# Patient Record
Sex: Male | Born: 1950
Health system: Southern US, Community
[De-identification: ages and names within clinical notes are randomized; demographics above are authoritative.]

## PROBLEM LIST (undated history)

## (undated) DIAGNOSIS — Z87442 Personal history of urinary calculi: Secondary | ICD-10-CM

## (undated) DIAGNOSIS — D369 Benign neoplasm, unspecified site: Secondary | ICD-10-CM

## (undated) DIAGNOSIS — E119 Type 2 diabetes mellitus without complications: Secondary | ICD-10-CM

## (undated) DIAGNOSIS — N200 Calculus of kidney: Secondary | ICD-10-CM

## (undated) DIAGNOSIS — E669 Obesity, unspecified: Secondary | ICD-10-CM

## (undated) DIAGNOSIS — K219 Gastro-esophageal reflux disease without esophagitis: Secondary | ICD-10-CM

## (undated) DIAGNOSIS — I10 Essential (primary) hypertension: Secondary | ICD-10-CM

## (undated) DIAGNOSIS — K625 Hemorrhage of anus and rectum: Secondary | ICD-10-CM

## (undated) DIAGNOSIS — N19 Unspecified kidney failure: Secondary | ICD-10-CM

## (undated) DIAGNOSIS — E7800 Pure hypercholesterolemia, unspecified: Secondary | ICD-10-CM

## (undated) HISTORY — DX: Type 2 diabetes mellitus without complications: E11.9

## (undated) HISTORY — PX: HERNIA REPAIR: SHX51

## (undated) HISTORY — DX: Pure hypercholesterolemia, unspecified: E78.00

## (undated) HISTORY — DX: Gastro-esophageal reflux disease without esophagitis: K21.9

## (undated) HISTORY — DX: Essential (primary) hypertension: I10

## (undated) HISTORY — PX: CIRCUMCISION: SUR203

## (undated) HISTORY — DX: Benign neoplasm, unspecified site: D36.9

## (undated) HISTORY — DX: Obesity, unspecified: E66.9

## (undated) HISTORY — PX: COLONOSCOPY: SHX174

## (undated) HISTORY — PX: OTHER SURGICAL HISTORY: SHX169

## (undated) HISTORY — DX: Hemorrhage of anus and rectum: K62.5

## (undated) HISTORY — DX: Calculus of kidney: N20.0

---

## 1998-03-25 ENCOUNTER — Other Ambulatory Visit: Admission: RE | Admit: 1998-03-25 | Discharge: 1998-03-25 | Payer: Self-pay | Admitting: Internal Medicine

## 2000-09-24 ENCOUNTER — Encounter: Payer: Self-pay | Admitting: Internal Medicine

## 2000-09-24 ENCOUNTER — Ambulatory Visit (HOSPITAL_COMMUNITY): Admission: RE | Admit: 2000-09-24 | Discharge: 2000-09-24 | Payer: Self-pay | Admitting: Internal Medicine

## 2000-09-25 ENCOUNTER — Ambulatory Visit (HOSPITAL_COMMUNITY): Admission: RE | Admit: 2000-09-25 | Discharge: 2000-09-25 | Payer: Self-pay | Admitting: Urology

## 2000-09-25 ENCOUNTER — Encounter: Payer: Self-pay | Admitting: Urology

## 2000-10-17 ENCOUNTER — Encounter: Payer: Self-pay | Admitting: Urology

## 2000-10-17 ENCOUNTER — Ambulatory Visit (HOSPITAL_COMMUNITY): Admission: RE | Admit: 2000-10-17 | Discharge: 2000-10-17 | Payer: Self-pay | Admitting: Urology

## 2001-06-14 ENCOUNTER — Encounter: Payer: Self-pay | Admitting: Internal Medicine

## 2001-06-14 ENCOUNTER — Ambulatory Visit (HOSPITAL_COMMUNITY): Admission: RE | Admit: 2001-06-14 | Discharge: 2001-06-14 | Payer: Self-pay | Admitting: Internal Medicine

## 2001-08-08 ENCOUNTER — Ambulatory Visit (HOSPITAL_COMMUNITY): Admission: RE | Admit: 2001-08-08 | Discharge: 2001-08-08 | Payer: Self-pay | Admitting: Internal Medicine

## 2001-08-08 ENCOUNTER — Encounter (INDEPENDENT_AMBULATORY_CARE_PROVIDER_SITE_OTHER): Payer: Self-pay | Admitting: Specialist

## 2005-12-25 ENCOUNTER — Ambulatory Visit: Payer: Self-pay | Admitting: Internal Medicine

## 2006-07-31 ENCOUNTER — Ambulatory Visit: Payer: Self-pay | Admitting: Internal Medicine

## 2006-10-17 ENCOUNTER — Encounter: Payer: Self-pay | Admitting: Orthopedic Surgery

## 2006-10-17 ENCOUNTER — Ambulatory Visit (HOSPITAL_COMMUNITY): Admission: RE | Admit: 2006-10-17 | Discharge: 2006-10-17 | Payer: Self-pay | Admitting: Internal Medicine

## 2007-03-19 ENCOUNTER — Ambulatory Visit: Payer: Self-pay | Admitting: Gastroenterology

## 2007-03-28 ENCOUNTER — Ambulatory Visit (HOSPITAL_COMMUNITY): Admission: RE | Admit: 2007-03-28 | Discharge: 2007-03-28 | Payer: Self-pay | Admitting: Gastroenterology

## 2007-04-03 ENCOUNTER — Ambulatory Visit: Payer: Self-pay | Admitting: Gastroenterology

## 2007-04-03 ENCOUNTER — Encounter (INDEPENDENT_AMBULATORY_CARE_PROVIDER_SITE_OTHER): Payer: Self-pay | Admitting: Specialist

## 2007-04-03 ENCOUNTER — Ambulatory Visit (HOSPITAL_COMMUNITY): Admission: RE | Admit: 2007-04-03 | Discharge: 2007-04-03 | Payer: Self-pay | Admitting: Gastroenterology

## 2007-04-05 ENCOUNTER — Ambulatory Visit: Payer: Self-pay | Admitting: Gastroenterology

## 2007-04-18 ENCOUNTER — Ambulatory Visit (HOSPITAL_COMMUNITY): Admission: RE | Admit: 2007-04-18 | Discharge: 2007-04-18 | Payer: Self-pay | Admitting: Gastroenterology

## 2007-04-22 ENCOUNTER — Ambulatory Visit: Payer: Self-pay | Admitting: Gastroenterology

## 2007-11-08 ENCOUNTER — Ambulatory Visit: Payer: Self-pay | Admitting: Gastroenterology

## 2008-05-26 ENCOUNTER — Ambulatory Visit: Payer: Self-pay | Admitting: Gastroenterology

## 2008-06-18 ENCOUNTER — Ambulatory Visit (HOSPITAL_COMMUNITY): Admission: RE | Admit: 2008-06-18 | Discharge: 2008-06-18 | Payer: Self-pay | Admitting: Urology

## 2008-06-18 ENCOUNTER — Encounter: Payer: Self-pay | Admitting: Orthopedic Surgery

## 2008-06-23 ENCOUNTER — Ambulatory Visit: Payer: Self-pay | Admitting: Orthopedic Surgery

## 2008-06-23 DIAGNOSIS — M5126 Other intervertebral disc displacement, lumbar region: Secondary | ICD-10-CM

## 2008-06-23 DIAGNOSIS — M545 Low back pain, unspecified: Secondary | ICD-10-CM | POA: Insufficient documentation

## 2008-06-23 DIAGNOSIS — M4800 Spinal stenosis, site unspecified: Secondary | ICD-10-CM

## 2008-07-14 ENCOUNTER — Ambulatory Visit: Payer: Self-pay | Admitting: Orthopedic Surgery

## 2008-10-13 DIAGNOSIS — E785 Hyperlipidemia, unspecified: Secondary | ICD-10-CM | POA: Insufficient documentation

## 2008-10-13 DIAGNOSIS — F449 Dissociative and conversion disorder, unspecified: Secondary | ICD-10-CM | POA: Insufficient documentation

## 2008-10-13 DIAGNOSIS — R1310 Dysphagia, unspecified: Secondary | ICD-10-CM

## 2008-10-13 DIAGNOSIS — N200 Calculus of kidney: Secondary | ICD-10-CM | POA: Insufficient documentation

## 2008-10-13 DIAGNOSIS — R109 Unspecified abdominal pain: Secondary | ICD-10-CM | POA: Insufficient documentation

## 2008-10-13 DIAGNOSIS — K649 Unspecified hemorrhoids: Secondary | ICD-10-CM | POA: Insufficient documentation

## 2008-10-13 DIAGNOSIS — K219 Gastro-esophageal reflux disease without esophagitis: Secondary | ICD-10-CM | POA: Insufficient documentation

## 2008-10-19 ENCOUNTER — Ambulatory Visit: Payer: Self-pay | Admitting: Orthopedic Surgery

## 2008-12-10 ENCOUNTER — Ambulatory Visit: Payer: Self-pay | Admitting: Gastroenterology

## 2009-02-12 ENCOUNTER — Telehealth: Payer: Self-pay | Admitting: Orthopedic Surgery

## 2009-02-19 ENCOUNTER — Telehealth: Payer: Self-pay | Admitting: Orthopedic Surgery

## 2009-02-22 ENCOUNTER — Telehealth: Payer: Self-pay | Admitting: Orthopedic Surgery

## 2009-02-22 ENCOUNTER — Ambulatory Visit (HOSPITAL_COMMUNITY): Admission: RE | Admit: 2009-02-22 | Discharge: 2009-02-22 | Payer: Self-pay | Admitting: Orthopedic Surgery

## 2009-03-09 ENCOUNTER — Ambulatory Visit: Payer: Self-pay | Admitting: Orthopedic Surgery

## 2009-04-20 ENCOUNTER — Ambulatory Visit: Payer: Self-pay | Admitting: Orthopedic Surgery

## 2009-05-26 ENCOUNTER — Encounter (INDEPENDENT_AMBULATORY_CARE_PROVIDER_SITE_OTHER): Payer: Self-pay | Admitting: *Deleted

## 2009-06-30 ENCOUNTER — Ambulatory Visit: Payer: Self-pay | Admitting: Gastroenterology

## 2009-06-30 DIAGNOSIS — D126 Benign neoplasm of colon, unspecified: Secondary | ICD-10-CM

## 2009-09-28 ENCOUNTER — Ambulatory Visit: Payer: Self-pay | Admitting: Gastroenterology

## 2009-09-29 ENCOUNTER — Encounter: Payer: Self-pay | Admitting: Gastroenterology

## 2009-11-03 DIAGNOSIS — D369 Benign neoplasm, unspecified site: Secondary | ICD-10-CM

## 2009-11-03 HISTORY — DX: Benign neoplasm, unspecified site: D36.9

## 2009-11-10 ENCOUNTER — Ambulatory Visit (HOSPITAL_COMMUNITY): Admission: RE | Admit: 2009-11-10 | Discharge: 2009-11-10 | Payer: Self-pay | Admitting: Gastroenterology

## 2009-11-10 ENCOUNTER — Ambulatory Visit: Payer: Self-pay | Admitting: Gastroenterology

## 2009-11-22 ENCOUNTER — Encounter: Payer: Self-pay | Admitting: Gastroenterology

## 2009-11-22 ENCOUNTER — Encounter (INDEPENDENT_AMBULATORY_CARE_PROVIDER_SITE_OTHER): Payer: Self-pay

## 2009-11-24 ENCOUNTER — Encounter (INDEPENDENT_AMBULATORY_CARE_PROVIDER_SITE_OTHER): Payer: Self-pay | Admitting: *Deleted

## 2009-12-04 DIAGNOSIS — K625 Hemorrhage of anus and rectum: Secondary | ICD-10-CM

## 2009-12-04 HISTORY — DX: Hemorrhage of anus and rectum: K62.5

## 2010-03-11 ENCOUNTER — Ambulatory Visit: Payer: Self-pay | Admitting: Gastroenterology

## 2010-03-11 DIAGNOSIS — M279 Disease of jaws, unspecified: Secondary | ICD-10-CM | POA: Insufficient documentation

## 2010-05-16 ENCOUNTER — Telehealth (INDEPENDENT_AMBULATORY_CARE_PROVIDER_SITE_OTHER): Payer: Self-pay

## 2010-09-15 ENCOUNTER — Encounter (INDEPENDENT_AMBULATORY_CARE_PROVIDER_SITE_OTHER): Payer: Self-pay | Admitting: *Deleted

## 2010-10-11 ENCOUNTER — Ambulatory Visit: Payer: Self-pay | Admitting: Gastroenterology

## 2010-11-08 ENCOUNTER — Ambulatory Visit: Payer: Self-pay | Admitting: Gastroenterology

## 2010-12-21 ENCOUNTER — Ambulatory Visit (HOSPITAL_COMMUNITY)
Admission: RE | Admit: 2010-12-21 | Discharge: 2010-12-21 | Payer: Self-pay | Source: Home / Self Care | Attending: Internal Medicine | Admitting: Internal Medicine

## 2011-01-03 NOTE — Assessment & Plan Note (Signed)
Summary: RECTAL BLEEDING 2o to hemorrhoids   Visit Type:  Follow-up Visit Primary Care Provider:  Jeanann Lewandowsky, M.D.  Chief Complaint:  F/U abd pain.  History of Present Illness: Getting over bronchitis. 3 weeks ago: "sick as a dog", vomiting, diarrhea, scratchy throat, and abd pain. Rx: Zithromax-didn't resolve, Rx: additional and 2 shots & ABX and Sx improving. Cough perists. Still having LUQ pain: 2x/week. "Eating habits not that great".  Weight unchanged since 2010. Bms: pretty good. Still hemorrhoid thing-hurt and bleed ever now and then. BRBPR every 2 weeks. Suppositories help but doesn't take them regularly. Bleeds: at least every week. BMs: al least 1x/day-hard, loose, not usu. watery. Last routine course of meds-NEVER. Sits a lot and very rarely lifts anything. Reading on the toilet.   Current Medications (verified): 1)  Nadolol 40 Mg  Tabs (Nadolol) 2)  Felodipine 10 Mg  Xr24h-Tab (Felodipine) 3)  Lipitor 40 Mg  Tabs (Atorvastatin Calcium) 4)  Metamucil 30.9 % Powd (Psyllium) .... Once Daily Prn 5)  Diovan Hct 80-12.5 Mg Tabs (Valsartan-Hydrochlorothiazide) .... Once Daily 6)  Dexilant 60 Mg Cpdr (Dexlansoprazole) .Marland Kitchen.. 1po Daily  Allergies (verified): 1)  ! * Ivp Dye  Past History:  Past Medical History: RECTAL BLEEDING 2011 **MOD IH DEC 2010 SIMPLE ADENOMA 1.1 cm-TCS DEC 2010  HTN GERD kidney stones high cholesterol  Past Surgical History: Reviewed history from 06/30/2009 and no changes required. hernia circumcision LASIX 2003  Family History: Reviewed history from 09/28/2009 and no changes required. No FH of Colon Cancer or colon polyps.  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  Alcohol Use - no Niece plays basketball for college in [place].  Vital Signs:  Patient profile:   60 year old male Height:      70 inches Weight:      222 pounds BMI:     31.97 Temp:      98.5 degrees F oral Pulse rate:   64 / minute BP sitting:   128 / 70  (left arm) Cuff size:   large  Vitals Entered By: Waldon Merl LPN (November  8, 624THL 11:15 AM)  Physical Exam  General:  Well developed, well nourished, no acute distress. Head:  Normocephalic and atraumatic. Eyes:  PERRL, no icterus. Mouth:  No deformity or lesions, dentition normal. Lungs:  Clear throughout to auscultation. Heart:  Regular rate and rhythm; no murmurs. Abdomen:  Soft, nontender and nondistended. Normal bowel sounds.  Impression & Recommendations:  Problem # 1:  HEMORRHOIDS (ICD-455.6) Assessment Deteriorated With ongoing pain and rectal bleeding. Use Anusol HC supp twice daily for 10 days. Repeat for another 10 days if pain and rectal bleeding persists. USE COLACE 100 MG two times a day FOR THE NEXT 30 DAYS. Don't strain with BMs. Do NOT read on the toilet. Drink 8 cups of WATER DAILY. Use hemorrhoidal donut for prolonged sitting. Follow up in 4 weeks. If Sx not improved then you can have a FLEX SIG to band your hemorrhoids.  CC: PCP  Patient Instructions: 1)  Use Anusol HC supp twice daily for 10 days. Repeat for another 10 days if pain and rectal bleeding persists. 2)  USE COLACE 100 MG two times a day FOR THE NEXT 30 DAYS. 3)  Don't strain with BMs. Do NOT read on the toilet. 4)  Drink 8 cups of WATER DAILY. 5)  Follow up in 4 weeks. If Sx not improved then  you can have a FLEX SIG to band your hemorrhoids. 6)  The medication list was reviewed and reconciled.  All changed / newly prescribed medications were explained.  A complete medication list was provided to the patient / caregiver. Prescriptions: HEMORRHOID DONUT USE WHILE SITTING FOR PROLONGED PERIODS OF TIME  #1 x 0   Entered and Authorized by:   Danie Binder MD   Signed by:   Danie Binder MD on 10/11/2010   Method used:   Print then Give to Patient   RxID:   [id number] ANUSOL-HC 25 MG SUPP (HYDROCORTISONE ACETATE) 1 by  mouth two times a day FOR 10 DAYS  #20 x 1   Entered and Authorized by:   Danie Binder MD   Signed by:   Danie Binder MD on 10/11/2010   Method used:   Electronically to        [facility]. [phone]* (retail)       [address]       [place], [place]  [address]       Ph: [phone] or [phone]       Fax: [phone]   RxID:   [id number]   Appended Document: RECTAL BLEEDING 2o to hemorrhoids 4 WKS F/U OPV IS IN THE COMPUTER

## 2011-01-03 NOTE — Letter (Signed)
Summary: Recall Office Visit  [facility]  [address]   [place], [place] [address]   Phone: [phone]  Fax: [phone]      September 15, 2010   Bruce Weber [address] [place], [place]  [address] October 10, 1951   Dear [name],   According to our records, it is time for you to schedule a follow-up office visit with [place].   At your convenience, please call [phone] to schedule an office visit. If you have any questions, concerns, or feel that this letter is in error, we would appreciate your call.   Sincerely,    [facility] Ph: [phone]   Fax: [phone]

## 2011-01-03 NOTE — Progress Notes (Signed)
Summary: dexilant refills  Phone Note Call from Patient Call back at [phone]   Caller: Patient Summary of Call: pt called- stated he needed his refills for Dexilant changed to 90 day supply instead of 30 day supply so his insurance will pay for it. he uses [facility] Initial call taken by: Burnadette Peter LPN,  June 13, 624THL 624THL AM     Appended Document: dexilant refills    Prescriptions: DEXILANT 60 MG CPDR (DEXLANSOPRAZOLE) 1po daily  #90 x 3   Entered and Authorized by:   Andria Meuse FNP-BC   Signed by:   Andria Meuse FNP-BC on 05/16/2010   Method used:   Electronically to        [facility]. [phone]* (retail)       [address]       [place], [place]  [address]       Ph: [phone] or [phone]       Fax: [phone]   RxID:   [id number]

## 2011-01-03 NOTE — Assessment & Plan Note (Signed)
Summary: LUQ ABD PAIN, LARGE SIMPLE ADENOMAS   Visit Type:  Follow-up Visit Primary Care Provider:  Jeanann Lewandowsky, M.D.  Chief Complaint:  abd pain.  History of Present Illness: Been pretty good. Gets pain sometimes in RUQ, and LUQ. Irregular sleeping cycle. Eats out a lot.  Works up hill and feels winded. WIFE BEING TREATED FOR BREAST CA.  Allergies: 1)  ! 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  Alcohol Use - no  Review of Systems       JULY 2010: 221 LBS  C/O LEFT POSTERIOR JAW PAIN INTERMITTENTLY WHEN HE SWALLOWS. No difficulty swallowing.  Vital Signs:  Patient profile:   60 year old male Height:      70 inches Weight:      228 pounds Temp:     98.4 degrees F oral Pulse rate:   56 / minute BP sitting:   130 / 82  (left arm) Cuff size:   regular  Vitals Entered By: Waldon Merl LPN (April  8, 624THL 624THL AM)  Physical Exam  General:  Well developed, well nourished, no acute distress. Head:  Normocephalic and atraumatic. Lungs:  Clear throughout to auscultation. Heart:  Regular rate and rhythm; no murmurs. Abdomen:  Soft, nontender and nondistended. Normal bowel sounds.  Impression & Recommendations:  Problem # 1:  ABDOMINAL PAIN (ICD-789.00) Assessment Unchanged  Rare mild flares and most likely 2o to GERD or non-ulcer dyspepsia. Continue Dexilant. OPV in 6 mos.  Orders: Est. Patient Level II [id number])  Problem # 2:  COLONIC POLYPS, ADENOMATOUS (ICD-211.3) Assessment: Comment Only  TCS DEC 2013.  CC: PCP  Orders: Est. Patient Level II [id number])  Problem # 3:  UNSPECIFIED DISEASE OF THE JAWS (ICD-526.9) Assessment: Comment Only See ENT if pain persists. Prescriptions: DEXILANT 60 MG CPDR (DEXLANSOPRAZOLE) 1po daily  #30 x 5   Entered and  Authorized by:   Danie Binder MD   Signed by:   Danie Binder MD on 03/11/2010   Method used:   Electronically to        [facility]. [phone]* (retail)       [address]       [place], [place]  [address]       Ph: [phone] or [phone]       Fax: [phone]   RxID:   [id number]   Appended Document: LUQ ABD PAIN, LARGE SIMPLE ADENOMAS reminder made for 96m f/u appt- cdg

## 2011-01-05 NOTE — Assessment & Plan Note (Signed)
Summary: HEMORRHOIDS, GERD   Visit Type:  Follow-up Visit Primary Care Trinika Cortese:  Carlis Abbott, M.D.  Chief Complaint:  Hemorrhoids.  History of Present Illness: Been up all night working. Suppositories have helped with hemorrhoid and things not hanging down. Hard for supp to stay in. Right now not having any problems and not seeing blood or feeling them. Rare abd pain LUQ and lower suprapubic pain-sticking a needle couple of times. Stool has been softer. Got donut and hasn't used it. Cut down reading on the toilet. Drinking more water but it is tough. Weight down 5 lbs since APR 2011.  Current Medications (verified): 1)  Nadolol 40 Mg  Tabs (Nadolol) 2)  Felodipine 10 Mg  Xr24h-Tab (Felodipine) 3)  Lipitor 40 Mg  Tabs (Atorvastatin Calcium) 4)  Metamucil 30.9 % Powd (Psyllium) .... Once Daily Prn 5)  Diovan Hct 80-12.5 Mg Tabs (Valsartan-Hydrochlorothiazide) .... Once Daily 6)  Dexilant 60 Mg Cpdr (Dexlansoprazole) .Marland Kitchen.. 1po Daily 7)  Anusol-Hc 25 Mg Supp (Hydrocortisone Acetate) .Marland Kitchen.. 1 By Mouth Two Times A Day For 10 Days 8)  Hemorrhoid Donut .... Use While Sitting For Prolonged Periods of Time  Allergies (verified): 1)  ! * Ivp Dye  Past History:  Past Medical History: RECTAL BLEEDING 2011 2o to internal hemorrhoids **TCS-MOD IH DEC 2010 SIMPLE ADENOMA 1.1 cm-TCS DEC 2010  HTN GERD kidney stones high cholesterol  Family History: Reviewed history from 09/28/2009 and no changes required. No FH of Colon Cancer or colon polyps.  Social History: Reviewed history from 10/11/2010 and no changes required. 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  Alcohol Use - no Niece plays basketball for college in [place].  Vital Signs:  Patient profile:   60 year old male Height:      70 inches Weight:      223.50 pounds BMI:     32.18 Temp:     98.8 degrees F oral Pulse rate:   60 / minute BP sitting:   120 / 70  (left  arm) Cuff size:   large  Vitals Entered By: Bruce Weber (December  6, 624THL 9:15 AM)  Physical Exam  General:  Well developed, well nourished, no acute distress. Head:  Normocephalic and atraumatic. Lungs:  Clear throughout to auscultation. Heart:  Regular rate and rhythm; no murmurs. Abdomen:  Soft, nontender and nondistended. Normal bowel sounds.  Impression & Recommendations:  Problem # 1:  HEMORRHOIDS (ICD-455.6) Assessment Improved May use supp as needed. OPV in APR 2012. Pt declined endoscopic banding at this time.   CC: PCP  Problem # 2:  GASTROESOPHAGEAL REFLUX DISEASE (ICD-530.81) Assessment: Comment Only REFILLED DEXILANT. Continue weight loss effort and diet modification. Prescriptions: DEXILANT 60 MG CPDR (DEXLANSOPRAZOLE) 1po daily  #90 x 3   Entered and Authorized by:   Danie Binder MD   Signed by:   Danie Binder MD on 11/08/2010   Method used:   Electronically to        [facility]. [phone]* (retail)       [address]       [place], [place]  [address]       Ph: [phone] or [phone]       Fax: [phone]   RxID:   [id number]   Appended Document: HEMORRHOIDS, GERD APRIL 2012 F/U OPV IS IN THE COMPUTER  Appended Document: Orders Update  Clinical Lists Changes  Orders: Added new Service order of Est. Patient Level II [id number]) - Signed

## 2011-03-22 ENCOUNTER — Encounter: Payer: Self-pay | Admitting: Gastroenterology

## 2011-04-18 NOTE — Op Note (Signed)
NAME:  ANREW, BUCHKO                  ACCOUNT NO.:  [id number]   MEDICAL RECORD NO.:  [id number]          PATIENT TYPE:  AMB   LOCATION:  DAY                           FACILITY:  [facility]   PHYSICIAN:  Caro Hight, M.D.      DATE OF BIRTH:  [birth year]   DATE OF PROCEDURE:  04/05/2007  DATE OF DISCHARGE:                               OPERATIVE REPORT   REFERRING PHYSICIAN:  Jeanann Lewandowsky, M.D.   PROCEDURE PERFORMED:  Bravo capsule read.   INDICATIONS FOR PROCEDURE:  Mr. Hellums is a 60 year old male with  atypical reflux.  He reports having sharp needle-like pain in his left  upper quadrant that comes and goes.  He also has sensation of feeling  like food or air gets stuck in his esophagus.  The sensation usually  occurs three or four hours after he eats.  He also gets a fluttering in  the middle of his chest.  Has been happening for approximately two  years.  Only eating brings this sensation on.  It may also occur as a  throbbing sensation.  This also occurs as a sensation like somebody  stuck a vacuum in its esophagus.  His symptoms are worse when he lies  down.  He has been sleeping with the head of his bed elevated because of  the sensation.  He takes Nexium once a day because of these symptoms.  It is also associated with bouts of belching.  The Bravo capsule was  placed to evaluate for atypical reflux symptoms.  The Bravo study was  performed for a total of 96 hours.   INTERPRETATION OF STUDY:  On day 1 Mr. Zima had 46 episodes of reflux.  He had two episodes that lasted longer than five minutes.  The duration  of his longest episode of reflux was 13 minutes.  He spent a total of 55  minutes at a pH less than 5.  His DeMeester score on day 1 was 13.1.  Normal was less than 14.72.  On day 2 he had four episodes of reflux.  None of his episodes of reflux lasted greater than five minutes.  He had  one minute of pH less than 4.  His DeMeester score on day 2 was 0.7.   On day 3 Mr.  Melendres had 35 episodes of reflux.  His longest episode of  reflux lasted two minutes.  He spent 11 minutes at pH less than 4.  His  Demeester score was 6.1.  On day 4 he had two episodes reflux.  No  episodes of reflux lasted longer than 1 minute.  His DeMeester score was  0.5.  Within the first 48 hours his symptom association probability was  99.7 for regurgitation, 39.8 for heartburn and 28.7 for chest pain.  Within the second 48 hours, his symptoms association probability was  10.9 for regurgitation, 17.6 for heartburn and 10.9 for chest pain.   RECOMMENDATIONS:  I spoke with Mr. Iaquinto in regards to whether or not  he experienced his atypical symptoms during study.  He stated he did  experienced the symptoms to some degree but was not especially bothered  with these symptoms to a moderate to severe degree.  This study does  show [place] that Nexium 40 mg once a day provides adequate acid suppression.  Mr. Byrdsong symptoms may be related to an esophageal motility disorder,  non-  ulcer dyspepsia, or globus hystericus.  I discussed additional options  for working up his symptoms to include esophageal manometry or barium  swallow. He declined esophageal manometry at this time.  A barium  swallow will be ordered as soon as possible.  He has a follow-up  appointment to see me on May 19.      Caro Hight, M.D.  Electronically Signed     SM/MEDQ  D:  04/14/2007  T:  04/15/2007  Job:  [id number]   cc:   Jeanann Lewandowsky, M.D.  Fax: [phone]

## 2011-04-18 NOTE — Assessment & Plan Note (Signed)
NAMEBRYEN, Bruce Weber                   CHART#:  [id number]   DATE:  11/08/2007                       DOB:  08/29/1951   PROBLEM LIST:  1. Atypical reflux, possibly globus hystericus.  2. Hemorrhoids.  3. Tubular adenoma in 2005.  4. ALLERGY TO INTRAVENOUS PYELOGRAM DYE.  5. Esophagogastroduodenoscopy with Bravo study in April of 2008 which      showed no evidence for reflux.  6. Esophagogastroduodenoscopy with cold forceps biopsy of gastric      polyps which showed hyperplastic polyp in April of 2008.  7. Small bilateral kidney stones.  8. Hypertension.  9. Hyperlipidemia.  10.History of irregular heart beat.  11.Right inguinal hernia repair in 1991.   SUBJECTIVE:  [name] is a 60 year old man who presents as a return  patient visit.  He still has some problems swallowing at nighttime,  usually before he goes to sleep.  He also complains of sone left-sided  pain still, but it is better.  He does have posterior nasal drainage.  He is still having problem with hemorrhoids and constipation.  He is  only using his Metamucil as needed.   MEDICATIONS:  1. Plendil.  2. Nexium 40 mg daily.  3. Lipitor 40 mg daily.  4. Nadolol 40 mg daily.  5. Lexapro 10 mg daily.  6. Metamucil as needed.   OBJECTIVE:  Weight 232 pounds, height 5 foot 10, temperature is 97.9,  blood pressure 132/90, pulse 60.  GENERAL:  He is in no apparent distress, alert and oriented x4.  LUNGS:  Clear to auscultation bilaterally.  CARDIOVASCULAR:  Regular rhythm.  ABDOMEN:  Bowel sounds present and soft, nondistended, obese, nontender.   ASSESSMENT:  [name] is a 60 year old male who likely has chronic  posterior nasal drainage, globus hystericus or Non-acid reflux disease  and GERD, controlled by PPI.  He had no evidence of esophageal motility  disorder by barium swallow in 2008 and no physiologic acid exposure in  his esophagus by Bravo study in 2008.  Thank you for allowing me to see  [name] in  consultation.  My recommendations follow.   RECOMMENDATIONS:  1. Screening colonoscopy in 2010.  2. He should continue his Nexium daily which is providing adequate      acid suppression.  3. He should use his Metamucil regularly so he does not have      difficulty with constipation.  He will follow up with his surgeon      for management of his hemorrhoids.  I did inform him that      constipation will make hemorrhoids worse.  4. Follow up appointment in 6 months.       Caro Hight, M.D.  Electronically Signed     SM/MEDQ  D:  11/08/2007  T:  11/09/2007  Job:  [id number]   cc:   Jeanann Lewandowsky, M.D.

## 2011-04-18 NOTE — Op Note (Signed)
NAME:  Bruce Weber, BONDER                  ACCOUNT NO.:  [id number]   MEDICAL RECORD NO.:  [id number]          PATIENT TYPE:  AMB   LOCATION:  DAY                           FACILITY:  [facility]   PHYSICIAN:  Caro Hight, M.D.      DATE OF BIRTH:  1951/01/13   DATE OF PROCEDURE:  04/05/2007  DATE OF DISCHARGE:                               OPERATIVE REPORT   REFERRING PHYSICIAN:  Jeanann Lewandowsky, M.D.   PROCEDURE PERFORMED:  Bravo capsule read.   INDICATIONS FOR PROCEDURE:  Mr. Gai is a 60 year old male with  atypical reflux.  He reports having sharp needle-like pain in his left  upper quadrant that comes and goes.  He also has sensation of feeling  like food or air gets stuck in his esophagus.  The sensation usually  occurs three or four hours after he eats.  He also gets a fluttering in  the middle of his chest.  Has been happening for approximately two  years.  Only eating brings this sensation on.  It may also occur as a  throbbing sensation.  This also occurs as a sensation like somebody  stuck a vacuum in its esophagus.  His symptoms are worse when he lies  down.  He has been sleeping with the head of his bed elevated because of  the sensation.  He takes Nexium once a day because of these symptoms.  It is also associated with bouts of belching.  The Bravo capsule was  placed to evaluate for atypical reflux symptoms.  The Bravo study was  performed for a total of 96 hours.   INTERPRETATION OF STUDY:  On day 1 Mr. Zins had 46 episodes of reflux.  He had two episodes that lasted longer than five minutes.  The duration  of his longest episode of reflux was 13 minutes.  He spent a total of 55  minutes at a pH less than 5.  His Demeester score on day 1 was 13.1.  Normal was less than 14.72.  On day 2 he had four episodes of reflux.  None of his episodes of reflux lasted greater than five minutes.  He had  one minute of pH less than 4.  His Demeester score on day 2 was 0.7.   On day 3 Mr.  Rizk had 35 episodes of reflux.  His longest episode of  reflux lasted two minutes.  He spent 11 minutes at pH less than 4.  His  Demeester score was 6.1.  On day 4 he had two episodes reflux.  No  episodes of reflux lasted longer than 1 minute.  His Demeester score was  0.5.  Within the first 48 hours his symptom association probability was  99.7 for regurgitation, 39.8 for heartburn and 28.7 for chest pain.  Within the second 48 hours, his symptoms association probability was  10.9 for regurgitation, 17.6 for heartburn and 10.9 for chest pain.   RECOMMENDATIONS:  I spoke with Mr. Hankinson in regard to whether or not he  experienced his atypical symptoms during study.  He stated he did  experienced the symptoms to some degree but was not bothered with these  symptoms to a moderate to severe degree.  This study does show [place] that  Nexium 40 mg once a day provides adequate acid suppression.  Mr.  Eastin symptoms may be related to an esophageal motility disorder,  nonulcer  dyspepsia, or globus hystericus.  I discussed additional options for  working up his symptoms to include esophageal manometry or barium  swallow. He declined esophageal manometry at this time.  A barium  swallow will be ordered as soon as possible.  He has a follow-up  appointment to see me on May 19.      Caro Hight, M.D.     SM/MEDQ  D:  04/14/2007  T:  04/15/2007  Job:  [id number]   cc:   Jeanann Lewandowsky, M.D.  Fax: [phone]

## 2011-04-18 NOTE — Op Note (Signed)
NAME:  Bruce Weber, Bruce Weber                  ACCOUNT NO.:  [id number]   MEDICAL RECORD NO.:  [id number]          PATIENT TYPE:  AMB   LOCATION:  DAY                           FACILITY:  [facility]   PHYSICIAN:  Caro Hight, M.D.      DATE OF BIRTH:  [birth year]   DATE OF PROCEDURE:  04/03/2007  DATE OF DISCHARGE:  04/03/2007                                PROCEDURE NOTE   PROCEDURE:  Esophagogastroduodenoscopy with cold forceps biopsy.   INDICATION FOR EXAM:  Mr. Tietjen is a 60 year old male who presents with  atypical reflux.  His esophagogastroduodenoscopy is performed to  evaluate his atypical reflux as well as for placement of a Bravo  capsule.   FINDINGS:  1. Normal esophagus without evidence of erosion, ulcerations or      Barrett's.  His EG junction was identified at 42 cm from the teeth.      The Bravo capsule was placed 36 cm from the teeth.  2. Multiple polyps seen in the proximal and mid body of the stomach.      Biopsies obtained via cold forceps.  No erosions, ulcerations or      polyps seen in the antrum.  3. Normal duodenal bulb and second portion of the duodenum.   RECOMMENDATIONS:  1. Resume previous diet.  2. Continue Nexium 40 mg daily.  Will proceed with 96 hour Bravo      study.  3. No aspirin, NSAIDs or anticoagulation for 5 days.  4. He has a 3 week followup appointment to see Dr. Stann Mainland to continue      to evaluate his atypical reflux.  If his Bravo study shows no      evidence of acid exposure or Nexium 40 mg, then we will proceed      with esophageal manometry or Barium Swallow.   PROCEDURE TECHNIQUE:  Physical exam was performed and informed consent  was obtained from the patient after explaining the benefits, risks, and  alternatives to the procedure.  The patient was connected to the monitor  and placed in the left lateral position.  Continuous oxygen was provided  by nasal cannula and IV medicine administered through an indwelling  cannula.  After administration  of sedation, the patient's esophagus was  intubated and the scope was advanced under direct visualization to the  second portion of the duodenum.  The scope was subsequently followed by  careful exam of the color, texture, anatomy, and integrity of the mucosa  on the way out.  The EG junction was first identified at 42 cm.  The  Bravo applicator was introduced to 36 cm from the teeth.  The suction  was applied for 30 seconds.  The diagnostic gastroscope was then  reintroduced and capsule placement was confirmed to the side of the  esophagus.  The patient tolerated the procedure well.  The patient was  recovered in Endoscopy and discharged home in satisfactory condition.      Caro Hight, M.D.  Electronically Signed     SM/MEDQ  D:  04/10/2007  T:  04/10/2007  Job:  [id number]   cc:   Jeanann Lewandowsky, M.D.  Fax: [phone]

## 2011-04-18 NOTE — Assessment & Plan Note (Signed)
NAMEFELIMON, CEPHAS                   CHART#:  [id number]   DATE:  04/22/2007                       DOB:  [birth year]   REFERRING PHYSICIAN:  Jeanann Lewandowsky, M.D.   PROBLEM LIST:  1. Atypical reflux, likely secondary to globus hystericus.  2. Hemorrhoids.  3. Personal history of polyps, last colonoscopy in 2005, with a      tubular adenoma.  4. Allergy to IVP DYE.  5. EGD with Bravo study in April 2008, which showed no physiologic      evidence for reflux disease.  6. EGD with cold forceps biopsy of gastric polyps, which showed      hyperplastic polyps in April 2008.  7. CT scan of the abdomen with and without IV and p.o. contrast in      April 2008 which showed small bilateral nephrolithiases.  8. Hypertension.  9. Hyperlipidemia.  10.History of irregular heartbeat.  11.Right inguinal hernia repair in 1991.   SUBJECTIVE:  Mr. Cuny is a 60 year old male who presents as a return  patient visit.  I spoke to the patient on the telephone in regard to his  Bravo capsule study.  He still continued to have some atypical reflux  symptoms and was bothered by his esophageal symptoms.  He was scheduled  for a barium swallow.  His barium swallow was scheduled on Apr 18, 2007.  He had a normal esophageal distention and motility.  He did have some  mild pyriformis sinus pooling following several swallows.  He had no  evidence of a mass, stricture, or significant dysmotility.  No evidence  of a hiatal hernia.  He has no other questions, concerns, or complaints  today.   MEDICATIONS:  1. Plendil 10 mg daily.  2. Nexium 40 mg daily.  3. Lipitor 40 mg daily.  4. Nadolol 40 mg daily.  5. Lexapro 10 mg daily.   OBJECTIVE:  VITAL SIGNS:  Weight 235 pounds, height 5 feet 10 inches,  temperature 97.9, blood pressure 142/90, pulse 55.GENERAL:  He is no  apparent distress.  Alert and oriented x4. LUNGS:  Clear to auscultation  bilaterally.CARDIOVASCULAR:  Regular rhythm, no murmur, normal S1 and  S2.ABDOMEN:  Bowel sounds present.  Soft, nontender, nondistended.  No  rebound or guarding.   ASSESSMENT:  Mr. Tweet is a 60 year old male who has a sensation like  somebody stuck a vacuum in his esophagus, which primarily occurs when he  is under stress.  His symptoms are likely secondary to globus  hystericus.  He has no evidence of an esophageal motility disorder, and  no evidence of physiologic acid exposure in his esophagus.  Thank you  for allowing me to see the patient in consultation.  My recommendations  follow.   RECOMMENDATIONS:  1. Screening colonoscopy in 2010 due to his personal history of      adenomatous polyps.  2. He may follow up with me in 6 months.  3. He should continue his Nexium daily.       Caro Hight, M.D.  Electronically Signed     SM/MEDQ  D:  04/22/2007  T:  04/23/2007  Job:  [id number]   cc:   Jeanann Lewandowsky, M.D.

## 2011-04-18 NOTE — Assessment & Plan Note (Signed)
Bruce Weber, Bruce Weber                   CHART#:  [id number]   DATE:  05/26/2008                       DOB:  11/11/1951   REFERRING PHYSICIAN:  Jeanann Lewandowsky, MD.   PROBLEM LIST:  1. Atypical reflux, likely globus hystericus.  2. Hemorrhoids.  3. Tubular adenoma in 2005.  4. Allergy to IVP dye.  5. EGD with Bravo study in April 2008, which showed no evidence for      reflux on Nexium 40 mg daily.  6. Hyperplastic gastric polyp in April 2008.  7. Bilateral kidney stones.  8. Hypertension.  9. Hyperlipidemia.  10.History of irregular heart beat.  11.Right inguinal hernia repair in 1991.   SUBJECTIVE:  Bruce Weber is a 60 year old male who presents as return  patient visit.  He was last seen in December 2008.  He said a month ago,  he had a sore throat.  He had a chest x-Giovan and a negative strep.  He  was treated for sinus infection with steroids and antibiotics.  He  developed a persistent cough with clear phlegm.  Two weeks later, he was  seen and had blood work done.  He states he was given 2 shots of  antibiotics.  He was also given some throat spray.  He had a persistent  cough and was seen by his primary care physician and was placed on  Reglan and Kapidex.  He is not coughing anymore.   MEDICATIONS:  1. Plendil.  2. Lipitor.  3. Nadolol.  4. Lexapro rarely.  5. Metamucil.  6. Tessalon Perles 2 t.i.d.  7. Kapidex 60 mg daily.  8. He did not get the prescription for the metoclopramide filled.   PHYSICAL EXAMINATION:  VITAL SIGNS:  Weight 226 pounds (down 6 pounds  since December 2008), height 5 feet 10 inches, temperature 98.1, blood  pressure 120/82, and pulse 76.GENERAL:  He is in no apparent distress.  Alert and oriented x4.LUNGS:  Clear to auscultation bilaterally.  CARDIOVASCULAR:  Regular rhythm.  No murmurs.ABDOMEN:  Bowel sounds are  present, soft, nontender, and nondistended.   ASSESSMENT:  Bruce Weber is a 60 year old male who had persistent cough  after having  an upper respiratory infection.  He has had a Bravo study  that confirms that he has no clinically significant gastroesophageal  reflux disease that would likely be contributing to his cough.  He more  unlikely had post infectious reactive airway disease.  Thank you for  allowing me to see Bruce Weber in consultation.  My recommendations  follow.   RECOMMENDATIONS:  He has been given a sample of 10 Kapidex to take, and  a prescription for Kapidex 60 mg daily, prescription for 90 and refill  x2.  He has a follow up appointment to see me in 6 months.       Caro Hight, M.D.  Electronically Signed     SM/MEDQ  D:  05/26/2008  T:  05/27/2008  Job:  [id number]   cc:   Jeanann Lewandowsky, M.D.

## 2011-04-18 NOTE — Assessment & Plan Note (Signed)
Bruce Weber, Bruce Weber                   CHART#:  [id number]   DATE:  12/10/2008                       DOB:  1951-05-02   REFERRING PHYSICIAN:  Jeanann Lewandowsky, MD, fax [phone].   PROBLEM LIST:  1. Atypical reflux likely globus hystericus.  2. Hemorrhoids.  3. Tubular adenoma in 2005.  4. Allergy to IVP dye.  5. EGD with Bravo study in April 2008, which showed no evidence for      reflux, on Nexium 40 mg daily.  6. Hyperplastic gastric polyp removed in April 2008.  7. Bilateral kidney stones.  8. Hypertension.  9. Hyperlipidemia.  10.History of irregular heartbeat.  11.Right inguinal hernia repair in 1991.   SUBJECTIVE:  The patient is a 60 year old male who was last seen in June  2009.  He has been maintained on Kapidex and has continued on that  medication.  He states that he is doing fairly well.  He was having  problem with back pain, was put on prednisone and made his blood  pressure increase.  He rarely has pain on his left side.  The sensation  in the back of his throat has gone.  Occasionally, he feels like he  might have sinus drainage.  He has been then to doing some physical  labor and it makes him feel better.  He have flare of his hemorrhoids.  He is not seeing any blood.   FAMILY HISTORY:  He denies any family history colon cancer and colon  polyps.   ALLERGIES:  IVP dye and prednisone causes hypertension.   MEDICATIONS:  1. Plendil.  2. Lipitor.  3. Nadolol.  4. Lexapro as needed.  5. Metamucil as needed.  6. Reglan as needed for nausea.  7. Kapidex 60 mg daily.   OBJECTIVE:  VITAL SIGNS:  Weight 230 pounds (up 4 pounds since June  2009), height 5 feet 10 inches, BMI 33 (obese), temperature 98.2, blood  pressure 140/80, and pulse 64.GENERAL:  He is in no apparent distress.  Alert and oriented x4.LUNGS:  Clear to auscultation  bilaterally.CARDIOVASCULAR:  Regular rhythm.  No murmur.ABDOMEN:  Bowel  sounds are present, soft, nontender, nondistended,  obese.   ASSESSMENT:  The patient is a 60 year old male with gastroesophageal  reflux disease, which seems to be fairly well controlled on Kapidex.  He  continues to intermittently have globus hystericus.  He had a tubular  adenoma in 2005.  Thank you for allowing me to see the patient in  consultation.  My recommendations follow.   RECOMMENDATIONS:  1. He also had a colonoscopy in 2002, which showed a 5-mm sessile      polyp and a 1-cm pedunculated polyp.  The path report is not      available to me. The patient had a simple adenoma, which was 3 mm      in 2005.  He should have a screening colonoscopy within the next 5-      10 years.  2. He should continue the Kapidex.  He is given a 72-month supply with      5 refills and a co-pay reimbursement card.  3. Follow up appointment in 6 months.       Caro Hight, M.D.  Electronically Signed     SM/MEDQ  D:  12/10/2008  T:  12/10/2008  Job:  [id number]   cc:   Jeanann Lewandowsky, M.D.

## 2011-04-21 NOTE — Consult Note (Signed)
NAME:  GLEASON, BERBER                  ACCOUNT NO.:  [id number]   MEDICAL RECORD NO.:  [id number]          PATIENT TYPE:  AMB   LOCATION:                                FACILITY:  [facility]   PHYSICIAN:  Caro Hight, M.D.      DATE OF BIRTH:  Dec 13, 1950   DATE OF CONSULTATION:  DATE OF DISCHARGE:                                 CONSULTATION   REASON FOR VISIT:  Left upper quadrant pain.   HISTORY OF PRESENT ILLNESS:  Mr. Reffett is a 60 year old male who has  had pain in his left upper quadrant  for 2-3 years.  He reports that his  last evaluation was approximately 3 years ago.  He has had an upper  endoscopy approximately 10-12 years ago which revealed that he had a  hiatal hernia.  He reports having a CT scan 2-3 years ago which revealed  diverticulosis.  He had a colonoscopy approximately 3 years ago which  also revealed diverticulosis.  The reports are not available to me on  today.  He continues to complain of a sharp needle-like pain in his left  upper quadrant and that comes and goes.  He describes it as a kink.  He  said he has been told that it is food or air that is stuck in there.  He  states that it has gotten a little worse over the last the 2 years.  He  states it is always tender there.  It is not associated with fever,  nausea or vomiting.  He has intermittent diarrhea with constipation.  He  has more diarrhea than constipation.  He has not had any weight loss.  His sharp pain occurs 3-4 times a day.  Sometimes he may go a couple of  days without the pain.  It usually occurs 3-4 hours after he eats.  It  is always in the same area.  It does not radiate.  This pain has only  been happening for 2 years.  It definitely was not there 10 years ago.  He denies any problems swallowing.  He does not use aspirin, B.C.,  Goody's, ibuprofen, or Motrin or Aleve.  He uses Tylenol for pain.  He  has a history of using Celebrex the past for neck pain.  Only eating  makes it worse, and  possibly stress.  Nothing really makes it better.  When it comes, it lasts for seconds.  A throbbing sensation may last for  a couple of minutes.  He denies any blood in his stool except for when  he has hemorrhoids.  He did receive shots for his hemorrhoids but he  does not think that is working too good.   A second complaint he has is that 2-3 years ago he had pH study which  was negative for gastroesophageal reflux disease.  He was told he had  atypical symptoms.  He describes a flutter in his chest once a week.  Once a day he feels like somebody stuck a vacuum in his esophagus and it  feels like somebody is sucking  air out of his esophagus.  That sensation  is worse when he lays down.  He has been sleeping with the head of his  bed elevated because of the sensation.  He takes Nexium once a day  because of this.  It is also associated with spells of belching.   PAST MEDICAL HISTORY:  1. Hypertension.  2. Acid reflux.  3. Kidney stones.  4. Hyperlipidemia.  5. History of an irregular heartbeat.  6. Hemorrhoids.   PAST SURGICAL HISTORY:  Right inguinal hernia repair in 1991.   ALLERGIES:  IVP DYE.   MEDICATIONS:  1. Plendil 10 mg daily.  2. Nexium 40 mg daily.  3. Lipitor 40 mg daily.  4. Nadolol 40 mg daily.  5. Lexapro 10 mg daily.   FAMILY HISTORY:  He has no family history of colon cancer or colon  polyps.   SOCIAL HISTORY:  He is married and has no children.  He works for [employer].  He does not smoke or drink any alcohol.   REVIEW OF SYSTEMS:  Per the HPI; otherwise, all systems are negative.   PHYSICAL EXAMINATION:  VITAL SIGNS:  White 235 pounds, height 5 feet 10  inches, BMI 33.7 (obese).  Temperature 97.8, blood pressure 132/78,  pulse 72.  GENERAL:  He is in no apparent distress, alert and oriented  x4.  HEENT:  Exam is atraumatic, normocephalic.  Pupils equal and  reactive to light.  Mouth:  No oral lesions.  Posterior pharynx without  erythema or exudate.   NECK:  Has full range of motion.  No  lymphadenopathy.  LUNGS:  Clear to auscultation bilaterally.  CARDIOVASCULAR:  Shows regular rhythm, no murmur, normal S1 and S2.  CHEST:  Nontender, no reproducible pain.ABDOMEN:  Bowel sounds present,  soft, nontender, nondistended.  No rebound or guarding.  No  hepatosplenomegaly, abdominal bruits or pulsatile masses, obese.  Negative Carnett's sign.  EXTREMITIES:  No cyanosis, clubbing or edema.  NEUROLOGIC:  No focal neurologic deficits.   ASSESSMENT:  Mr. Mausolf is a 60 year old male with left upper quadrant  pain which is likely a functional gut disorder.  The differential  diagnosis includes a low likelihood of gastritis, or a pancreatic  malignancy.  He also has chest discomfort and the differential diagnosis  includes atypical reflux versus diffuse esophageal spasm.   PLAN:  1. He is asked to begin Citrucel daily.  If his symptoms are not      improved after 1 month, then he is asked to add lactobacillus      capsules once daily.  2. He will be scheduled for an EGD with Bravo capsule placement to      evaluate for gastroesophageal reflux disease.  If he has      breakthrough acid exposure in his esophagus, then his esophageal      spasm may be due to uncontrolled reflux disease.  If the Bravo      capsule shows no acid exposure in his esophagus then an esophageal      manometry or barium swallow will be performed.  3. He will be scheduled for a CT of the abdomen with and without IV      contrast  and p.o. contrast to evaluate for pancreatic malignancy.  4. I will obtain the pathology report from polyps that have been      removed on his prior colonoscopies done within the last 10 years.  5. He has a return patient visit to see me in  2 months.      Caro Hight, M.D.  Electronically Signed     SM/MEDQ  D:  03/19/2007  T:  03/19/2007  Job:  [id number]  cc:   Jeanann Lewandowsky, M.D.  Fax: [phone]

## 2011-04-21 NOTE — Procedures (Signed)
[facility]  Patient:    Bruce Weber, Bruce Weber Visit Number: [id number] MRN: [id number]          Service Type: END Location: ENDO Attending Physician:  Vincent Peyer Dictated by:   Lowella Bandy. Olevia Perches, M.D. [facility] Admit Date:  08/08/2001   CC:         Don Broach. Carlis Abbott, M.D.   Procedure Report  PROCEDURE:  Colonoscopy.  INDICATION:  This 60 year old gentleman has complained of left upper quadrant abdominal pain.  He has a history of kidney stones, a history of diverticulosis, left inguinal hernia.  He had a flexible sigmoidoscopy in 1995 that was normal.  He has had occasional bright red blood per rectum.  Physical exam on August 21, stool was Hemoccult-negative.  He had a tenderness in the left upper quadrant.  He is undergoing colonoscopy to explain his abdominal pain as well as rectal bleeding.  There is no family history of colon cancer.  SCOPE:  Olympus single-channel endoscope.  SEDATION:  Versed 9 mg IV, Demerol 90 mg IV.  FINDINGS:  Olympus single-channel endoscope passed through rectum to the sigmoid colon.  Patient was monitored by pulse oximetry.  His oxygen saturations were normal.  His prep was excellent.  Anal canal and rectal ampulla were unremarkable with no evidence of hemorrhoids.  There were only few scattered diverticula throughout the sigmoid colon, which appeared otherwise normal.  At about 50 cm from the rectum in the distal descending colon, there was a small 5 mm polyp which was sessile, and it was removed with cold biopsies and sent to pathology.  At the level of 75 cm from the rectum around the splenic flexure was another polyp, this one was pedunculated, measured 1 cm in diameter.  It was snared but not recovered.  Postpolypectomy site appeared normal.  Transverse colon, hepatic flexure, and ascending colon as well as cecum and ileocecal valve were unremarkable.  There were no additional polyps or diverticula in the right  colon.  Although we aspirated the second polyp into the scope, we were still in the process of looking for it in the specimen bottle.  If found, it will be sent to pathology.  IMPRESSION: 1. Two colonic polyps, status post polypectomies x 2. 2. Minimal diverticulosis of the left colon.  PLAN:  Patients symptoms of left upper quadrant abdominal pain are likely related to an irritable bowel syndrome.  The diverticulosis is not severe enough to cause symptoms, but he needs to lose some weight, stay on a high-fiber diet, and take fiber supplements.  He was also given Robinul Forte 2 mg p.r.n. for antispasmodic effect.  Because of the polyps, he will need to have a repeat colonoscopy in three years. Dictated by:   Lowella Bandy. Olevia Perches, M.D. [place] Attending Physician:  Vincent Peyer DD:  08/08/01 TD:  08/08/01 Job: [id number]

## 2011-04-21 NOTE — Assessment & Plan Note (Signed)
[facility]                           GASTROENTEROLOGY OFFICE NOTE   NAME:Bruce Weber, Bruce Weber                         MRN:          [id number]  DATE:07/31/2006                            DOB:          1951/07/10    Mr. Laseter is a very nice 60 year old African-American man who has  symptomatic hemorrhoids.  We have been seeing him over the years for  progressive esophageal reflux disease since 1995.  He also has a history of  benign colon polyps and underwent multiple colonoscopies, the last one in  August 2005.  He is here today because of continued symptomatic hemorrhoids.  Patient was evaluated with endoscopy in January of this year and was given  Anusol suppositories and Analpram cream.  His symptoms improved but flared  up again before him leaving for a cruise about three weeks ago.  He  continued to use suppositories and Analpram cream again with complete  cessation of the rectal bleeding, but he is only about 60 or 70% improved,  still having some rectal discomfort and pain.   PHYSICAL EXAMINATION:  VITAL SIGNS:  Blood pressure 118/78.  Pulse 60.  Weight 231 pounds.  GENERAL:  He was in no distress.  ABDOMEN:  Showed tenderness in left upper quadrant.  This was the same  finding as on a previous exam.  RECTUM:  On anoscopic exam there were first grade internal hemorrhoids, at  least three of them, circumferentially, no prolapse, no bleeding, stools  hemoccult negative.   IMPRESSION:  66. A 60 year old male with symptomatic hemorrhoids refractory to medical      treatment.  Patient wishes to have them treated further.  I suggest a      hemorrhoidal banding.  2. Gastroesophageal reflux disease evaluated previously.  3. Left upper quadrant abdominal discomfort, most likely related to left      colon/irritable bowel syndrome.   PLAN:  1. Refer to Dr. Lennie Hummer for hemorrhoidal banding.  2. Samples of Nexium to take 1 q.d. to b.i.d. depending on  the reflux      symptomatology.  3. Continue hemorrhoidal care.                                   Lowella Bandy. Olevia Perches, MD   DMB/MedQ  DD:  07/31/2006  DT:  08/01/2006  Job #:  [id number]   cc:   Lew Dawes. Rosana Hoes, MD  Jeanann Lewandowsky, MD

## 2011-06-12 ENCOUNTER — Encounter: Payer: Self-pay | Admitting: Gastroenterology

## 2011-06-12 ENCOUNTER — Ambulatory Visit (INDEPENDENT_AMBULATORY_CARE_PROVIDER_SITE_OTHER): Payer: 59 | Admitting: Gastroenterology

## 2011-06-12 DIAGNOSIS — R109 Unspecified abdominal pain: Secondary | ICD-10-CM

## 2011-06-12 NOTE — Progress Notes (Signed)
  Subjective:    Patient ID: Bruce Weber, male    DOB: Feb 13, 1951, 60 y.o.   MRN: [id number]  PCP: Jeanann Lewandowsky, M.D.  HPI C/o pain in left side. Only comes 1-2x/mo. Sx last: minutes (10-15). No Sx lately. Has changed her diet. Rare heartburn/indigestion-upr left chest. Given up tea, soda, and fried foods. POSTPRANDIAL SUGAR: 273. Has low testosterone-TAKES A PILL EVERY DAY. Hemorrhoids not bothering him.  Past Medical History  Diagnosis Date  . Rectal bleeding 2011    secondary to hemorrhoids  . Adenoma 11/2009    1.1 cm from TCS  . HTN (hypertension)   . GERD (gastroesophageal reflux disease)   . Kidney stones   . High cholesterol    Past Surgical History  Procedure Date  . Hernia repair   . Circumcision   . Colonoscopy Dec 2010 [facility]    MOD IH, SIMPLE ADENOMA 1.1 CM   Allergies  Allergen Reactions  .        Marland Kitchen Iodinated Diagnostic Agents Hives    30 YEARS AGO    Current Outpatient Prescriptions  Medication Sig Dispense Refill  . atorvastatin (LIPITOR) 40 MG tablet Take 40 mg by mouth daily.        Marland Kitchen dexlansoprazole (DEXILANT) 60 MG capsule Take 60 mg by mouth daily.        . felodipine (PLENDIL) 10 MG 24 hr tablet Take 10 mg by mouth daily.        . hydrocortisone (ANUSOL-HC) 25 MG suppository Place 25 mg rectally 2 (two) times daily.        . nadolol (CORGARD) 40 MG tablet Take 40 mg by mouth daily.        . psyllium (METAMUCIL) 58.6 % powder Take 1 packet by mouth 3 (three) times daily.        . valsartan-hydrochlorothiazide (DIOVAN-HCT) 80-12.5 MG per tablet Take 1 tablet by mouth daily.         .   Review of Systems  All other systems reviewed and are negative.       Objective:   Physical Exam  Constitutional: He is oriented to person, place, and time. He appears well-developed. No distress.  HENT:  Head: Normocephalic.  Cardiovascular: Normal rate.   Pulmonary/Chest: Effort normal and breath sounds normal. He exhibits no tenderness.  Abdominal:  Soft. Bowel sounds are normal. He exhibits no distension. There is no tenderness.  Neurological: He is alert and oriented to person, place, and time.          Assessment & Plan:

## 2011-06-12 NOTE — Assessment & Plan Note (Signed)
2o to ?GERD, ?gastritis, ?non-ulcer dyspepsia, or MS abd wall pain.  Continue Dexilant and modified diet/weight loss efforts. OPV in 6 mos.

## 2011-06-12 NOTE — Progress Notes (Signed)
Cc to Dr. Carlis Abbott

## 2011-06-21 NOTE — Progress Notes (Signed)
Reminder in epic for 7m follow up

## 2011-09-23 ENCOUNTER — Emergency Department (HOSPITAL_COMMUNITY)
Admission: EM | Admit: 2011-09-23 | Discharge: 2011-09-23 | Disposition: A | Discharge status: Home / Self Care | Payer: 59 | Attending: Emergency Medicine | Admitting: Emergency Medicine

## 2011-09-23 ENCOUNTER — Emergency Department (HOSPITAL_COMMUNITY): Payer: 59

## 2011-09-23 ENCOUNTER — Encounter (HOSPITAL_COMMUNITY): Payer: Self-pay

## 2011-09-23 DIAGNOSIS — H8309 Labyrinthitis, unspecified ear: Secondary | ICD-10-CM | POA: Insufficient documentation

## 2011-09-23 DIAGNOSIS — I10 Essential (primary) hypertension: Secondary | ICD-10-CM | POA: Insufficient documentation

## 2011-09-23 DIAGNOSIS — E7800 Pure hypercholesterolemia, unspecified: Secondary | ICD-10-CM | POA: Insufficient documentation

## 2011-09-23 DIAGNOSIS — K219 Gastro-esophageal reflux disease without esophagitis: Secondary | ICD-10-CM | POA: Insufficient documentation

## 2011-09-23 DIAGNOSIS — Z87442 Personal history of urinary calculi: Secondary | ICD-10-CM | POA: Insufficient documentation

## 2011-09-23 MED ORDER — MECLIZINE HCL 12.5 MG PO TABS
25.0000 mg | ORAL_TABLET | Freq: Once | ORAL | Status: AC
  Administered 2011-09-23: 25 mg via ORAL
  Filled 2011-09-23: qty 2

## 2011-09-23 MED ORDER — LORAZEPAM 1 MG PO TABS: 1.0000 mg | ORAL_TABLET | Freq: Three times a day (TID) | ORAL | Status: AC | PRN

## 2011-09-23 MED ORDER — MECLIZINE HCL 25 MG PO TABS: 25.0000 mg | ORAL_TABLET | Freq: Four times a day (QID) | ORAL | Status: AC | PRN

## 2011-09-23 MED ORDER — LORAZEPAM 1 MG PO TABS
1.0000 mg | ORAL_TABLET | Freq: Once | ORAL | Status: AC
  Administered 2011-09-23: 1 mg via ORAL
  Filled 2011-09-23: qty 1

## 2011-09-23 NOTE — ED Notes (Signed)
Pt left the er stating no needs

## 2011-09-23 NOTE — ED Notes (Signed)
Pt reports dizzy for 1 hour pta

## 2011-09-24 NOTE — ED Provider Notes (Signed)
History     CSN: [id number] Arrival date & time: 09/23/2011  8:47 PM   First MD Initiated Contact with Patient 09/23/11 2112      Chief Complaint  Patient presents with  . Dizziness    (Consider location/radiation/quality/duration/timing/severity/associated sxs/prior treatment) HPI... patient complains of dizziness after arising quickly from his computer chair. Symptoms happened abruptly. Slight ataxia. Feeling better now. Has past history of hypertension and borderline diabetes.  No frank neurological deficits at this time.  Nothing makes symptoms better or worse  Past Medical History  Diagnosis Date  . Rectal bleeding 2011    secondary to hemorrhoids  . Adenoma 11/2009    1.1 cm from TCS  . HTN (hypertension)   . GERD (gastroesophageal reflux disease)   . Kidney stones   . High cholesterol   . Obesity (BMI 30-39.9) DEC 2011 223 LBS    Past Surgical History  Procedure Date  . Hernia repair   . Circumcision   . Colonoscopy Dec 2010 [facility]    MOD IH, SIMPLE ADENOMA 1.1 CM    Family History  Problem Relation Age of Onset  . Colon cancer Neg Hx   . Colon polyps Neg Hx     History  Substance Use Topics  . Smoking status: Never Smoker   . Smokeless tobacco: Not on file   Comment: Never smoked  . Alcohol Use: No      Review of Systems  All other systems reviewed and are negative.    Allergies  Dye fdc blue and Iodinated diagnostic agents  Home Medications   Current Outpatient Rx  Name Route Sig Dispense Refill  . ATORVASTATIN CALCIUM 40 MG PO TABS Oral Take 40 mg by mouth daily.      . DEXLANSOPRAZOLE 60 MG PO CPDR Oral Take 60 mg by mouth daily.      Marland Kitchen FELODIPINE 10 MG PO TB24 Oral Take 10 mg by mouth daily.      Marland Kitchen NADOLOL 40 MG PO TABS Oral Take 40 mg by mouth daily.      . PSYLLIUM 58.6 % PO POWD Oral Take 1 packet by mouth 3 (three) times daily. As needed for constipation    . VALSARTAN-HYDROCHLOROTHIAZIDE 80-12.5 MG PO TABS Oral Take 1 tablet by  mouth daily.      Marland Kitchen HYDROCORTISONE ACETATE 25 MG RE SUPP Rectal Place 25 mg rectally 2 (two) times daily.      Marland Kitchen LORAZEPAM 1 MG PO TABS Oral Take 1 tablet (1 mg total) by mouth 3 (three) times daily as needed for anxiety. 15 tablet 0  . MECLIZINE HCL 25 MG PO TABS Oral Take 1 tablet (25 mg total) by mouth every 6 (six) hours as needed for dizziness. 15 tablet 0    BP 157/96  Pulse 56  Temp(Src) 97.6 F (36.4 C) (Oral)  Resp 16  Ht 5\' 10"  (1.778 m)  Wt 220 lb (99.791 kg)  BMI 31.57 kg/m2  SpO2 100%  Physical Exam  Nursing note and vitals reviewed. Constitutional: He is oriented to person, place, and time. He appears well-developed and well-nourished.  HENT:  Head: Normocephalic and atraumatic.  Eyes: Conjunctivae and EOM are normal. Pupils are equal, round, and reactive to light.  Neck: Normal range of motion. Neck supple.  Cardiovascular: Normal rate and regular rhythm.   Pulmonary/Chest: Effort normal and breath sounds normal.  Abdominal: Soft. Bowel sounds are normal.  Musculoskeletal: Normal range of motion.  Neurological: He is alert and oriented to person,  place, and time.  Skin: Skin is warm and dry.  Psychiatric: He has a normal mood and affect.    ED Course  Procedures (including critical care time)  Labs Reviewed  GLUCOSE, CAPILLARY - Abnormal; Notable for the following:    Glucose-Capillary 113 (*)    All other components within normal limits  LAB REPORT - SCANNED   Ct Head Wo Contrast  09/23/2011  *RADIOLOGY REPORT*  Clinical Data: Dizziness and weakness.  CT HEAD WITHOUT CONTRAST  Technique:  Contiguous axial images were obtained from the base of the skull through the vertex without contrast.  Comparison: None.  Findings: Fat density along the superior sagittal sinus is likely incidental.  A dilated perivascular space is noted below the right lentiform nucleus.  The brain stem, cerebellum, cerebral peduncles, thalami, basal ganglia, basilar cisterns, and  ventricular system appear unremarkable.  No intracranial hemorrhage, mass lesion, or acute infarction is identified.  Chronic maxillary and ethmoid sinusitis noted.  IMPRESSION:  1.  Chronic maxillary and ethmoid sinusitis.  Otherwise unremarkable exam.  Original Report Authenticated By: Carron Curie, M.D.     1. Labyrinthitis       MDM  CT scan normal by mouth. Antivert and Ativan has helped symptoms.  Glucose high normal. Patient able to go home.        Nat Christen, MD 09/24/11 [phone]

## 2011-11-05 ENCOUNTER — Other Ambulatory Visit: Payer: Self-pay | Admitting: Gastroenterology

## 2011-11-22 ENCOUNTER — Encounter: Payer: Self-pay | Admitting: Gastroenterology

## 2012-01-01 ENCOUNTER — Encounter: Payer: Self-pay | Admitting: *Deleted

## 2012-02-08 ENCOUNTER — Encounter: Payer: Self-pay | Admitting: General Practice

## 2012-03-07 ENCOUNTER — Ambulatory Visit: Payer: 59 | Admitting: Gastroenterology

## 2012-03-20 ENCOUNTER — Encounter: Payer: Self-pay | Admitting: Gastroenterology

## 2012-03-20 ENCOUNTER — Ambulatory Visit (INDEPENDENT_AMBULATORY_CARE_PROVIDER_SITE_OTHER): Payer: 59 | Admitting: Gastroenterology

## 2012-03-20 VITALS — BP 127/78 | HR 59 | Temp 97.6°F | Ht 70.0 in | Wt 222.0 lb

## 2012-03-20 DIAGNOSIS — K649 Unspecified hemorrhoids: Secondary | ICD-10-CM

## 2012-03-20 DIAGNOSIS — K219 Gastro-esophageal reflux disease without esophagitis: Secondary | ICD-10-CM

## 2012-03-20 MED ORDER — HYDROCORTISONE ACETATE 25 MG RE SUPP: 25.0000 mg | Freq: Two times a day (BID) | RECTAL | Status: DC

## 2012-03-20 NOTE — Progress Notes (Signed)
Reminder in epic to follow up in 6 months °

## 2012-03-20 NOTE — Progress Notes (Signed)
Faxed to PCP

## 2012-03-20 NOTE — Assessment & Plan Note (Signed)
FLARES INTERMITTENTLY. SX CONTROLLED WITH ANUSOL,.  REFILL ANUSOL. OPV IN 6 MOS.

## 2012-03-20 NOTE — Patient Instructions (Signed)
SEE NUTRITION FOR INSTRUCTION ON A DIABETIC DIET AND WEIGHT LOSS RECOMMENDATIONS. CONTINUE DEXILANT. USE ANUSOL AS NEEDED. FOLLOW UP IN 6 MOS.

## 2012-03-20 NOTE — Progress Notes (Signed)
Subjective:    Patient ID: Bruce Weber, male    DOB: Oct 27, 1951, 61 y.o.   MRN: [id number]  PCP: Carlis Abbott  HPI WAS WORKING FOR [employer] RUNNING TO [employer]. WORKING AT NIGHT: 12A-5A, 2-3 DAYS A WEEK. NOW WORKING FOR [employer] FOR [place]. SAW DR. Wright. FELT LIKE HAD A GAS ATTACK: STABBING-HADN'T HAD DINNER @8  pm. ATE A HOT DOG FOR LUNCH W/ CHILI. STOPPED AND GOT SOME ANTACIDS. Sx IMPROVED, BUT STILL A LITTLE SORE TO THE TOUCH. HEMORRHOIDS OK ONE WEEK THEN FLARES. SUPP WORK-MAY USE 2-3 DAYS AT A TIME.  TRIED TO DO WITHOUT DEXILANT AND Sx FLARES.  Past Medical History  Diagnosis Date  . Rectal bleeding 2011    secondary to hemorrhoids  . Adenoma 11/2009    1.1 cm from TCS  . HTN (hypertension)   . GERD (gastroesophageal reflux disease)   . Kidney stones   . High cholesterol   . Obesity (BMI 30-39.9) DEC 2011 223 LBS    Past Surgical History  Procedure Date  . Hernia repair   . Circumcision   . Colonoscopy Dec 2010 [facility]    MOD IH, SIMPLE ADENOMA 1.1 CM   Allergies  Allergen Reactions  . Dye Fdc Blue (Brilliant Blue Fcf (Fd&C Blue #1))     PT UNSURE WHICH DYE  . Iodinated Diagnostic Agents Hives    30 YEARS AGO    Current Outpatient Prescriptions  Medication Sig Dispense Refill  . atorvastatin (LIPITOR) 40 MG tablet Take 40 mg by mouth daily.        Marland Kitchen DEXILANT 60 MG capsule TAKE ONE CAPSULE BY MOUTH EVERY DAY    . felodipine (PLENDIL) 10 MG 24 hr tablet Take 10 mg by mouth daily.        . hydrocortisone (ANUSOL-HC) 25 MG suppository Place 25 mg rectally 2 (two) times daily.        . Linagliptin-Metformin HCl (JENTADUETO) 2.5-500 MG TABS Take by mouth daily.      . nadolol (CORGARD) 40 MG tablet Take 40 mg by mouth daily.        . psyllium (METAMUCIL) 58.6 % powder Take 1 packet by mouth 3 (three) times daily. As needed for constipation      . valsartan-hydrochlorothiazide (DIOVAN-HCT) 80-12.5 MG per tablet Take 1 tablet by  mouth daily.              Review of Systems     Objective:   Physical Exam  Vitals reviewed. Constitutional: He is oriented to person, place, and time. He appears well-developed and well-nourished. No distress.  HENT:  Head: Normocephalic and atraumatic.  Mouth/Throat: Oropharynx is clear and moist. No oropharyngeal exudate.  Eyes: Pupils are equal, round, and reactive to light. No scleral icterus.  Neck: Normal range of motion. Neck supple. No thyromegaly present.  Cardiovascular: Normal rate, regular rhythm and normal heart sounds.   Pulmonary/Chest: Effort normal and breath sounds normal. No respiratory distress.  Abdominal: Soft. Bowel sounds are normal. He exhibits no distension. There is tenderness (MILD TTP IN LUQ).  Musculoskeletal: Normal range of motion. He exhibits no edema.  Lymphadenopathy:    He has no cervical adenopathy.  Neurological: He is alert and oriented to person, place, and time.       NO  NEW FOCAL DEFICITS   Psychiatric: He has a normal mood and affect.          Assessment & Plan:

## 2012-03-20 NOTE — Assessment & Plan Note (Signed)
SX CONTROLED WITH DEXILANT. BMI > 30 AND NOW NEW ONSET DIABETES.  NUTRITION CONSULT FOR WEIGHT LOSS/DIABETIC DIET. CONTINUE DEXILANT. OPV IN 6 MOS.

## 2012-03-21 ENCOUNTER — Telehealth: Payer: Self-pay | Admitting: Gastroenterology

## 2012-03-21 NOTE — Telephone Encounter (Signed)
Referral faxed to [facility]

## 2012-03-25 ENCOUNTER — Telehealth (HOSPITAL_COMMUNITY): Payer: Self-pay | Admitting: Dietician

## 2012-03-25 NOTE — Telephone Encounter (Signed)
Received referral from Dr. Oneida Alar [facility]) for dx: new onset diabetes, weight loss.

## 2012-03-28 NOTE — Telephone Encounter (Signed)
Appointment scheduled for Thursday, May 9th at 9:00 AM.

## 2012-04-03 ENCOUNTER — Telehealth (HOSPITAL_COMMUNITY): Payer: Self-pay | Admitting: Dietician

## 2012-04-03 NOTE — Telephone Encounter (Signed)
Sent appointment verification letter to pt home via [place] Mail for appointment scheduled for Thursday, 04/11/12 at 9:00 AM.

## 2012-04-11 ENCOUNTER — Encounter (HOSPITAL_COMMUNITY): Payer: Self-pay | Admitting: Dietician

## 2012-04-11 NOTE — Progress Notes (Signed)
Outpatient Initial Nutrition Assessment  Date:04/11/2012   Time: 9:00 AM  Referring Physician: Dr. Oneida Alar Reason for Visit: [facility]  PCP: Dr. Jeanann Lewandowsky [facility])  Nutrition Assessment:  Height: 5\' 10"  (177.8 cm)   Weight: 222 lb (100.699 kg)   IBW: 166# %IBW: 134% UBW: 22# %UBW: 100%  Body mass index is 31.85 kg/(m^2).  Goal Weight: 200# Weight hx: Pt has maintained his weight. He reports that he has not lost weight recently, despite some efforts with lifestyle change. He reports that he started gaining weight in 2008, when he retired, due to poor eating habits and leading a sedentary lifestyle.  Estimated nutritional needs: 2108-2230 kcals daily, 81-101 grams protein daily, 2.1-2.2 L fluid daily  PMH:  Past Medical History  Diagnosis Date  . Rectal bleeding 2011    secondary to hemorrhoids  . Adenoma 11/2009    1.1 cm from TCS  . HTN (hypertension)   . GERD (gastroesophageal reflux disease)   . Kidney stones   . High cholesterol   . Obesity (BMI 30-39.9) DEC 2011 223 LBS    Medications:  Current Outpatient Rx  Name Route Sig Dispense Refill  . ATORVASTATIN CALCIUM 40 MG PO TABS Oral Take 40 mg by mouth daily.      Marland Kitchen DEXILANT 60 MG PO CPDR  TAKE ONE CAPSULE BY MOUTH EVERY DAY 90 capsule 3  . FELODIPINE ER 10 MG PO TB24 Oral Take 10 mg by mouth daily.      Marland Kitchen HYDROCORTISONE ACETATE 25 MG RE SUPP Rectal Place 1 suppository (25 mg total) rectally 2 (two) times daily. 24 suppository 1  . LINAGLIPTIN-METFORMIN HCL 2.5-500 MG PO TABS Oral Take by mouth daily.    Marland Kitchen NADOLOL 40 MG PO TABS Oral Take 40 mg by mouth daily.      . PSYLLIUM 58.6 % PO POWD Oral Take 1 packet by mouth 3 (three) times daily. As needed for constipation    . VALSARTAN-HYDROCHLOROTHIAZIDE 80-12.5 MG PO TABS Oral Take 1 tablet by mouth daily.        Labs: CMP  No results found for this basename: na, k, cl, co2, glucose, bun, creatinine, calcium, prot, albumin, ast, alt, alkphos,  bilitot, gfrnonaa, gfraa    Lipid Panel  No results found for this basename: chol, trig, hdl, cholhdl, vldl, ldlcalc     No results found for this basename: HGBA1C   No results found for this basename: GLUF, MICROALBUR, LDLCALC, CREATININE     Lifestyle/ social habits: Mr. Herin in a very pleasant gentleman who lives in [place]. He is married. He has no children. 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 He reports his stress level as a 5-6, reporting he does not stress out often, but worries about finances in this poor economy. He reports his stress level was much higher when he worked at his former job, due to overnight work hours and multiple travel commitments.   Nutrition hx/habits: Mr. Springer reports that his PCP, Dr. Jeanann Lewandowsky in [place], recommended he lose 30#. He reports that Dr. Carlis Abbott told him if he loses weight, his blood sugars would improve. No recent lab work in [place], but he reports that his most recent lab work was [facility]. He has made some changes in diet, including eating 3 meals a day and drinking more water. He mostly eats out during the week, due to being on the road frequently. He eats home cooked meals  on the weekend, when he visits his in-laws in [place]. He does very little physical activity outside work. He is contemplating walking, due to more flexibility in his work schedule. He also report he has gym equipment in his basement. He reports he used to be quite active before he retired. He does not check his CBG's regularly, but reports he usually achieves readings in the low 100's when he does check.   Diet recall: Breakfast: PG's biscuit OR egg mcmuffin, decaf coffee with sugar OR OJ; Lunch: salad OR burger OR pizza OR plate from [employer]; Dinner: string beans, collard, greens, black eyed peas OR lasagna OR pizza buffett  Nutrition Diagnosis: Inconsistent carbohydrate intake r/t disordered eating pattern,  high refined carbohydrate food choices AEB new dx diabetes.  Nutrition Intervention: Nutrition rx: 1800 kcal NAS, diabetic diet; low calorie beverages only; limit 1 starch per meal; 3 meals per day; 30 minutes physical activity daily  Education/Counseling Provided: Educated pt on diabetic diet. Emphasized importance of eating 3 meals per day and maintaining a consistent meal schedule. Discussed plate method, carbohydrate containing foods, and portion control. Discussed importance of limiting concentrated sweets. Encouraged use of water, crystal light, and low calorie drinks. Discussed importance of regular physical activity along with diet modifications to achieve weight loss. Discussed slow, moderate weight loss of 1-2# per week and 7-10% weight loss of current body weight. Also showed pt functionality of MyFitnessPal app. Provided plate method handout.  Understanding, Motivation, Ability to Follow Recommendations: Expect fair to good compliance.   Monitoring and Evaluation: Goals: 1)1-2# weight loss per week; 2) 30 minutes physical activity daily; 3) HgbA1c <7.0  Recommendations:1) For weight loss: 1608-1730 kcals daily; 2) Use pedometer app on phone (Motion X) to track steps; 3) Use MyFitnessPal app to track food intake  F/U: PRN. Provided RD contact information.   Alene Mires, RD  04/11/2012  Time: 9:00 AM

## 2012-08-29 ENCOUNTER — Encounter: Payer: Self-pay | Admitting: Gastroenterology

## 2012-10-25 ENCOUNTER — Encounter: Payer: Self-pay | Admitting: *Deleted

## 2012-10-28 ENCOUNTER — Other Ambulatory Visit: Payer: Self-pay

## 2012-10-28 MED ORDER — DEXLANSOPRAZOLE 60 MG PO CPDR: 60.0000 mg | DELAYED_RELEASE_CAPSULE | Freq: Every day | ORAL | Status: DC

## 2012-11-11 ENCOUNTER — Ambulatory Visit (INDEPENDENT_AMBULATORY_CARE_PROVIDER_SITE_OTHER): Payer: 59 | Admitting: Gastroenterology

## 2012-11-11 ENCOUNTER — Encounter (HOSPITAL_COMMUNITY): Payer: Self-pay | Admitting: Pharmacy Technician

## 2012-11-11 ENCOUNTER — Encounter: Payer: Self-pay | Admitting: Gastroenterology

## 2012-11-11 VITALS — BP 134/78 | HR 56 | Temp 98.2°F | Ht 69.0 in | Wt 208.4 lb

## 2012-11-11 DIAGNOSIS — Z86010 Personal history of colonic polyps: Secondary | ICD-10-CM

## 2012-11-11 DIAGNOSIS — K219 Gastro-esophageal reflux disease without esophagitis: Secondary | ICD-10-CM

## 2012-11-11 MED ORDER — PEG 3350-KCL-NA BICARB-NACL 420 G PO SOLR: 4000.0000 mL | ORAL | Status: DC

## 2012-11-11 NOTE — Progress Notes (Signed)
Referring Provider: [name], MD Primary Care Physician:  [name], MD Primary Gastroenterologist: Dr. Oneida Alar   Chief Complaint  Patient presents with  . Colonoscopy    HPI:  61 year old male presents today for visit prior to surveillance colonoscopy. Last TCS in Dec 2010 with Dr. Oneida Alar, multiple colon polyps. Needs 3-year-surveillance.  Been taking more Metamucil, trying to lose weight. Was 222 in April 2013, now 208. Diet and exercise. Treadmill every day. Avoiding fried foods. Eating grilled chicken. Cut out sodas. No rectal bleeding. GERD improved with wt loss. States sometimes feels bloated when inhales then goes right away. LUQ discomfort improved since being on a diet.   Past Medical History  Diagnosis Date  . Rectal bleeding 2011    secondary to hemorrhoids  . Adenoma 11/2009    1.1 cm from TCS  . HTN (hypertension)   . GERD (gastroesophageal reflux disease)   . Kidney stones   . High cholesterol   . Obesity (BMI 30-39.9) DEC 2011 223 LBS    Past Surgical History  Procedure Date  . Hernia repair   . Circumcision   . Colonoscopy Dec 2010 [facility]    MOD IH, SIMPLE ADENOMA 1.1 CM    Current Outpatient Prescriptions  Medication Sig Dispense Refill  . dexlansoprazole (DEXILANT) 60 MG capsule Take 1 capsule (60 mg total) by mouth daily.  30 capsule  5  . felodipine (PLENDIL) 10 MG 24 hr tablet Take 10 mg by mouth daily.        . hydrocortisone (ANUSOL-HC) 25 MG suppository Place 1 suppository (25 mg total) rectally 2 (two) times daily.  24 suppository  1  . nadolol (CORGARD) 40 MG tablet Take 40 mg by mouth daily.        . psyllium (METAMUCIL) 58.6 % powder Take 1 packet by mouth 3 (three) times daily. As needed for constipation      . valsartan-hydrochlorothiazide (DIOVAN-HCT) 80-12.5 MG per tablet Take 1 tablet by mouth daily.        Marland Kitchen atorvastatin (LIPITOR) 40 MG tablet Take 40 mg by mouth daily.        . Linagliptin-Metformin HCl (JENTADUETO) 2.5-500 MG  TABS Take by mouth daily.        Allergies as of 11/11/2012 - Review Complete 11/11/2012  Allergen Reaction Noted  . Dye fdc blue (brilliant blue fcf (fd&c blue #1))  06/12/2011  . Iodinated diagnostic agents Hives 06/12/2011    Family History  Problem Relation Age of Onset  . Colon cancer Neg Hx   . Colon polyps Neg Hx     History   Social History  . Marital Status: Married    Spouse Name: N/A    Number of Children: N/A  . Years of Education: N/A   Social History Main Topics  . Smoking status: Never Smoker   . Smokeless tobacco: None     Comment: Never smoked  . Alcohol Use: No  . Drug Use: No  . Sexually Active: No   Other Topics Concern  . None   Social History Narrative  . None    Review of Systems: Gen: Denies fever, chills, anorexia. Denies fatigue, weakness, weight loss.  CV: Denies chest pain, palpitations, syncope, peripheral edema, and claudication. Resp: dry cough occasionally GI: SEE HPI Derm: Denies rash, itching, dry skin Psych: Denies depression, anxiety, memory loss, confusion. No homicidal or suicidal ideation.  Heme: Denies bruising, bleeding, and enlarged lymph nodes.  Physical Exam: BP 134/78  Pulse 56  Temp 98.2 F (36.8 C) (Oral)  Ht 5\' 9"  (1.753 m)  Wt 208 lb 6.4 oz (94.53 kg)  BMI 30.78 kg/m2 General:   Alert and oriented. No distress noted. Pleasant and cooperative.  Head:  Normocephalic and atraumatic. Eyes:  Conjuctiva clear without scleral icterus. Mouth:  Oral mucosa pink and moist. Good dentition. No lesions. Neck:  Supple, without mass or thyromegaly. Heart:  S1, S2 present without murmurs, rubs, or gallops. Regular rate and rhythm. Abdomen:  +BS, soft, non-tender and non-distended. No rebound or guarding. No HSM or masses noted. Rectus diastasis noted.  Msk:  Symmetrical without gross deformities. Normal posture. Extremities:  Without edema. Neurologic:  Alert and  oriented x4;  grossly normal neurologically. Skin:   Intact without significant lesions or rashes. Cervical Nodes:  No significant cervical adenopathy. Psych:  Alert and cooperative. Normal mood and affect.

## 2012-11-11 NOTE — Patient Instructions (Addendum)
We have set you up for a colonoscopy with Dr. Oneida Alar in the near future.   Do not take your diabetes medication the day of the procedure

## 2012-11-15 ENCOUNTER — Telehealth: Payer: Self-pay | Admitting: Gastroenterology

## 2012-11-15 DIAGNOSIS — Z86010 Personal history of colonic polyps: Secondary | ICD-10-CM | POA: Insufficient documentation

## 2012-11-15 NOTE — Assessment & Plan Note (Signed)
61 year old male with hx of adenomatous polyps; last TCS in Dec 2010 with multiple adenomas removed. Due for surveillance TCS now. No rectal bleeding or change in bowel habits.   Proceed with colonoscopy with Dr. Oneida Alar in the near future. The risks, benefits, and alternatives have been discussed in detail with the patient. They state understanding and desire to proceed.

## 2012-11-15 NOTE — Assessment & Plan Note (Signed)
Significantly improved with diet, exercise, intentional wt loss. Continue current dietary and behavior modification. Continue Dexilant daily. Return in 1 year for GERD f/u.

## 2012-11-15 NOTE — Telephone Encounter (Signed)
Please put pt on recall list for appt in 1 year, GERD f/u. Thanks!

## 2012-11-18 NOTE — Progress Notes (Signed)
Faxed to PCP

## 2012-11-19 NOTE — Telephone Encounter (Signed)
Reminder in epic to follow up in one year for GERD

## 2012-11-25 ENCOUNTER — Ambulatory Visit (HOSPITAL_COMMUNITY)
Admission: RE | Admit: 2012-11-25 | Discharge: 2012-11-25 | Disposition: A | Discharge status: Home / Self Care | Payer: 59 | Source: Ambulatory Visit | Attending: Gastroenterology | Admitting: Gastroenterology

## 2012-11-25 ENCOUNTER — Encounter (HOSPITAL_COMMUNITY): Payer: Self-pay | Admitting: *Deleted

## 2012-11-25 ENCOUNTER — Encounter (HOSPITAL_COMMUNITY)
Admission: RE | Disposition: A | Discharge status: Home / Self Care | Payer: Self-pay | Source: Ambulatory Visit | Attending: Gastroenterology

## 2012-11-25 DIAGNOSIS — K5730 Diverticulosis of large intestine without perforation or abscess without bleeding: Secondary | ICD-10-CM | POA: Insufficient documentation

## 2012-11-25 DIAGNOSIS — D126 Benign neoplasm of colon, unspecified: Secondary | ICD-10-CM | POA: Insufficient documentation

## 2012-11-25 DIAGNOSIS — Z85048 Personal history of other malignant neoplasm of rectum, rectosigmoid junction, and anus: Secondary | ICD-10-CM | POA: Insufficient documentation

## 2012-11-25 DIAGNOSIS — Z1211 Encounter for screening for malignant neoplasm of colon: Secondary | ICD-10-CM

## 2012-11-25 DIAGNOSIS — K648 Other hemorrhoids: Secondary | ICD-10-CM | POA: Insufficient documentation

## 2012-11-25 DIAGNOSIS — I10 Essential (primary) hypertension: Secondary | ICD-10-CM | POA: Insufficient documentation

## 2012-11-25 DIAGNOSIS — Z86010 Personal history of colonic polyps: Secondary | ICD-10-CM

## 2012-11-25 DIAGNOSIS — Z01812 Encounter for preprocedural laboratory examination: Secondary | ICD-10-CM | POA: Insufficient documentation

## 2012-11-25 HISTORY — PX: COLONOSCOPY: SHX5424

## 2012-11-25 LAB — GLUCOSE, CAPILLARY: Glucose-Capillary: 99 mg/dL (ref 70–99)

## 2012-11-25 SURGERY — COLONOSCOPY
Anesthesia: Moderate Sedation

## 2012-11-25 MED ORDER — STERILE WATER FOR IRRIGATION IR SOLN
Status: DC | PRN
  Administered 2012-11-25: 11:00:00

## 2012-11-25 MED ORDER — MEPERIDINE HCL 100 MG/ML IJ SOLN
INTRAMUSCULAR | Status: DC | PRN
  Administered 2012-11-25: 50 mg via INTRAVENOUS
  Administered 2012-11-25: 25 mg via INTRAVENOUS

## 2012-11-25 MED ORDER — MIDAZOLAM HCL 5 MG/5ML IJ SOLN
INTRAMUSCULAR | Status: DC | PRN
  Administered 2012-11-25: 2 mg via INTRAVENOUS

## 2012-11-25 MED ORDER — MIDAZOLAM HCL 5 MG/5ML IJ SOLN
INTRAMUSCULAR | Status: AC
  Filled 2012-11-25: qty 10

## 2012-11-25 MED ORDER — SODIUM CHLORIDE 0.45 % IV SOLN
INTRAVENOUS | Status: DC
  Administered 2012-11-25: 1000 mL via INTRAVENOUS

## 2012-11-25 MED ORDER — HYDROCORTISONE ACETATE 25 MG RE SUPP: 25.0000 mg | Freq: Two times a day (BID) | RECTAL | Status: DC

## 2012-11-25 MED ORDER — MEPERIDINE HCL 100 MG/ML IJ SOLN
INTRAMUSCULAR | Status: AC
  Filled 2012-11-25: qty 2

## 2012-11-25 NOTE — Op Note (Signed)
[place], [address]   COLONOSCOPY PROCEDURE REPORT  PATIENT: Bruce Weber, Bruce Weber  MR#: [id number] BIRTHDATE: May 15, 1951 , 59  yrs. old GENDER: Male ENDOSCOPIST: Barney Drain, MD REFERRED [id number] Carlis Abbott, M.D. PROCEDURE DATE:  11/25/2012 PROCEDURE:   Colonoscopy with snare polypectomy INDICATIONS:High risk patient with personal history of anal cancer.  MEDICATIONS: Demerol 75 mg IV and Versed 4 mg IV  DESCRIPTION OF PROCEDURE:    Physical exam was performed.  Informed consent was obtained from the patient after explaining the benefits, risks, and alternatives to procedure.  The patient was connected to monitor and placed in left lateral position. Continuous oxygen was provided by nasal cannula and IV medicine administered through an indwelling cannula.  After administration of sedation and rectal exam, the patients rectum was intubated and the [id number]  colonoscope was advanced under direct visualization to the cecum.  The scope was removed slowly by carefully examining the color, texture, anatomy, and integrity mucosa on the way out.  The patient was recovered in endoscopy and discharged home in satisfactory condition.       COLON FINDINGS: A sessile polyp measuring 6 mm in size was found at the hepatic flexure.  A polypectomy was performed using snare cautery.  , Moderate diverticulosis was noted in the ascending colon and sigmoid colon.  , The colon mucosa was otherwise normal. , and Large internal hemorrhoids were found.  PREP QUALITY: good. CECAL W/D TIME: 10 minutes  COMPLICATIONS: None  ENDOSCOPIC IMPRESSION: 1.   Sessile polyp measuring 6 mm in size was found at the hepatic flexure; polypectomy was performed using snare cautery 2.   Moderate diverticulosis was noted in the ascending colon and sigmoid colon 3.   The colon mucosa was otherwise normal 4.   Large internal  hemorrhoids      RECOMMENDATIONS: AWAIT BIOPSY HIGH FIBER DIET ANUSOL BID FOR 12 DAYS TCS IN 5 YEARS       _______________________________ eSignedBarney Drain, MD 11/25/2012 10:41 AM     PATIENT NAME:  Bruce Weber MR#: [id number]

## 2012-11-25 NOTE — H&P (Signed)
  Primary Care Physician:  [name], MD Primary Gastroenterologist:  Dr. Oneida Alar  Pre-Procedure History & Physical: HPI:  Bruce Weber is a 61 y.o. male here for  PERSONAL HISTORY OF POLYPS.   Past Medical History  Diagnosis Date  . Rectal bleeding 2011    secondary to hemorrhoids  . Adenoma 11/2009    1.1 cm from TCS  . HTN (hypertension)   . GERD (gastroesophageal reflux disease)   . Kidney stones   . High cholesterol   . Obesity (BMI 30-39.9) DEC 2011 223 LBS    Past Surgical History  Procedure Date  . Hernia repair   . Circumcision   . Colonoscopy Dec 2010 BRBPR    MOD IH, SIMPLE ADENOMA 1.1 CM    Prior to Admission medications   Medication Sig Start Date End Date Taking? Authorizing Provider  calcitRIOL (ROCALTROL) 0.25 MCG capsule Take 0.25 mcg by mouth daily.   Yes Historical Provider, MD  clomiPHENE (CLOMID) 50 MG tablet Take 25 mg by mouth daily.   Yes Historical Provider, MD  dexlansoprazole (DEXILANT) 60 MG capsule Take 1 capsule (60 mg total) by mouth daily. 10/28/12  Yes Orvil Feil, NP  felodipine (PLENDIL) 10 MG 24 hr tablet Take 10 mg by mouth daily.     Yes Historical Provider, MD  linagliptin (TRADJENTA) 5 MG TABS tablet Take 5 mg by mouth daily.   Yes Historical Provider, MD  nadolol (CORGARD) 40 MG tablet Take 40 mg by mouth daily.     Yes Historical Provider, MD  psyllium (METAMUCIL) 58.6 % powder Take 1 packet by mouth 3 (three) times daily. As needed for constipation   Yes Historical Provider, MD  valsartan-hydrochlorothiazide (DIOVAN-HCT) 320-12.5 MG per tablet Take 1 tablet by mouth daily.   Yes Historical Provider, MD    Allergies as of 11/11/2012 - Review Complete 11/11/2012  Allergen Reaction Noted  . Dye fdc blue (brilliant blue fcf (fd&c blue #1))  06/12/2011  . Iodinated diagnostic agents Hives 06/12/2011    Family History  Problem Relation Age of Onset  . Colon cancer Neg Hx   . Colon polyps Neg Hx     History   Social  History  . Marital Status: Married    Spouse Name: N/A    Number of Children: N/A  . Years of Education: N/A   Occupational History  . Not on file.   Social History Main Topics  . Smoking status: Never Smoker   . Smokeless tobacco: Not on file     Comment: Never smoked  . Alcohol Use: No  . Drug Use: No  . Sexually Active: No   Other Topics Concern  . Not on file   Social History Narrative  . No narrative on file    Review of Systems: See HPI, otherwise negative ROS   Physical Exam: BP 138/84  Pulse 58  Temp 98 F (36.7 C) (Oral)  Resp 25  Ht 5\' 9"  (1.753 m)  Wt 202 lb (91.627 kg)  BMI 29.83 kg/m2 General:   Alert,  pleasant and cooperative in NAD Head:  Normocephalic and atraumatic. Neck:  Supple; Lungs:  Clear throughout to auscultation.    Heart:  Regular rate and rhythm. Abdomen:  Soft, nontender and nondistended. Normal bowel sounds, without guarding, and without rebound.   Neurologic:  Alert and  oriented x4;  grossly normal neurologically.  Impression/Plan:    . Personal hx: polyps  PLAN: 1. TCS TODAY

## 2012-11-29 ENCOUNTER — Telehealth: Payer: Self-pay | Admitting: Gastroenterology

## 2012-11-29 ENCOUNTER — Encounter (HOSPITAL_COMMUNITY): Payer: Self-pay | Admitting: Gastroenterology

## 2012-11-29 NOTE — Telephone Encounter (Signed)
Please call pt. He had A simple adenoma removed from hIS colon. FOLLOW A High fiber diet. COMPLETE ANUSOL. TCS IN 5YEARS.

## 2012-12-02 NOTE — Telephone Encounter (Signed)
LMOM to call.

## 2012-12-02 NOTE — Telephone Encounter (Signed)
Pt returned call and was informed of results.  

## 2012-12-02 NOTE — Telephone Encounter (Signed)
Path faxed to PCP, recall made 

## 2013-01-22 ENCOUNTER — Other Ambulatory Visit: Payer: Self-pay

## 2013-01-22 MED ORDER — DEXLANSOPRAZOLE 60 MG PO CPDR: 60.0000 mg | DELAYED_RELEASE_CAPSULE | Freq: Every day | ORAL | Status: DC

## 2013-01-28 ENCOUNTER — Telehealth: Payer: Self-pay | Admitting: Gastroenterology

## 2013-01-28 MED ORDER — DEXLANSOPRAZOLE 60 MG PO CPDR: 60.0000 mg | DELAYED_RELEASE_CAPSULE | Freq: Every day | ORAL | Status: DC

## 2013-01-28 NOTE — Telephone Encounter (Signed)
Pt called to say that his pharmacy is still waiting to hear from [place]. He is wanting to get a 90 day supply instead of 30 because it would be cheaper. I don't know if [facility] is working on a PA or not, but patient did ask to be sure it is for a 90 day supply. Any questions you can call him at [phone]

## 2013-01-28 NOTE — Telephone Encounter (Signed)
This is the first I have heard of this. Dexilant send to pharmacy, 90 day supply.

## 2013-02-07 NOTE — Progress Notes (Signed)
TCS DEC 2013 SIMPLE ADENOMA   REVIEWED.

## 2013-02-11 ENCOUNTER — Ambulatory Visit: Payer: 59 | Admitting: Orthopedic Surgery

## 2013-04-15 ENCOUNTER — Ambulatory Visit (HOSPITAL_BASED_OUTPATIENT_CLINIC_OR_DEPARTMENT_OTHER): Payer: 59 | Attending: Internal Medicine | Admitting: Radiology

## 2013-04-15 VITALS — Ht 70.0 in | Wt 198.0 lb

## 2013-04-15 DIAGNOSIS — I4949 Other premature depolarization: Secondary | ICD-10-CM | POA: Insufficient documentation

## 2013-04-15 DIAGNOSIS — G4733 Obstructive sleep apnea (adult) (pediatric): Secondary | ICD-10-CM | POA: Insufficient documentation

## 2013-04-19 DIAGNOSIS — R0989 Other specified symptoms and signs involving the circulatory and respiratory systems: Secondary | ICD-10-CM

## 2013-04-19 DIAGNOSIS — R0609 Other forms of dyspnea: Secondary | ICD-10-CM

## 2013-04-19 DIAGNOSIS — G4733 Obstructive sleep apnea (adult) (pediatric): Secondary | ICD-10-CM

## 2013-04-19 NOTE — Procedures (Signed)
NAME:  Bruce Weber, Bruce Weber                  ACCOUNT NO.:  [id number]  MEDICAL RECORD NO.:  [id number]          PATIENT TYPE:  OUT  LOCATION:  SLEEP CENTER                 FACILITY:  [facility]  PHYSICIAN:  Clinton D. Annamaria Boots, MD, FCCP, FACPDATE OF BIRTH:  March 31[birth year] 1949  DATE OF STUDY:  04/15/2013                           NOCTURNAL POLYSOMNOGRAM  REFERRING PHYSICIAN:  Jeanann Lewandowsky, M.D.  INDICATION FOR STUDY:  Hypersomnia with sleep apnea.  EPWORTH SLEEPINESS SCORE:  7/24.  BMI 28.4, weight 198 pounds, height 70 inches, neck 16 inches.  MEDICATIONS:  Home medications are charted and reviewed.  SLEEP ARCHITECTURE:  Split study protocol.  During the diagnostic phase, total sleep time 128 minutes with sleep efficiency 85.3%.  Stage I was 17.2%, stage II 79.3%, stage III absent, REM 3.5% of total sleep time. Sleep latency 17.5 minutes, REM latency 59.5 minutes.  Awake after sleep onset 4.5 minutes.  Arousal index 48.8.  Bedtime medication:  None.  RESPIRATORY DATA:  Split study protocol.  Apnea/hypopnea index (AHI) 22.5 per hour.  A total of 58 events was scored including 6 obstructive apneas, 1 central apnea, 1 mixed apnea, 40 hypopneas.  Events were seen in all sleep positions.  REM AHI 40 per hour.  CPAP was titrated to 10 CWP, AHI 0.9 per hour.  He wore a medium ResMed fit air F10 full-face mask with heated humidifier.  OXYGEN DATA:  Moderate snoring before CPAP with oxygen desaturation to a nadir of 85% on room air.  With CPAP titration, snoring was prevented and mean oxygen saturation held 94.8% on room air.  CARDIAC DATA:  Sinus rhythm with PVCs.  MOVEMENT-PARASOMNIA:  No significant movement disturbance.  Bathroom x1.  IMPRESSIONS-RECOMMENDATIONS: 1. Moderate obstructive sleep apnea/hypopnea syndrome, AHI 22.5 per     hour with non-positional events.  Moderate snoring with oxygen     desaturation to a nadir of 85% on room air. 2. Successful CPAP titration to 10 CWP, AHI 0.9 per hour.  He  wore a     medium ResMed fit air F10 full-face     mask with heated humidifier.  Snoring was prevented and mean oxygen     saturation held 94.8% on room air with CPAP.     Clinton D. Annamaria Boots, MD, [facility], [place], [place] Board of Sleep Medicine    CDY/MEDQ  D:  04/19/2013 10:12:42  T:  04/19/2013 13:48:13  Job:  [id number]

## 2013-07-11 ENCOUNTER — Ambulatory Visit (INDEPENDENT_AMBULATORY_CARE_PROVIDER_SITE_OTHER): Payer: 59 | Admitting: Internal Medicine

## 2013-07-11 ENCOUNTER — Encounter: Payer: Self-pay | Admitting: Internal Medicine

## 2013-07-11 VITALS — BP 124/74 | HR 59 | Ht 69.0 in | Wt 210.0 lb

## 2013-07-11 DIAGNOSIS — G4733 Obstructive sleep apnea (adult) (pediatric): Secondary | ICD-10-CM

## 2013-07-11 DIAGNOSIS — J3089 Other allergic rhinitis: Secondary | ICD-10-CM

## 2013-07-11 DIAGNOSIS — J309 Allergic rhinitis, unspecified: Secondary | ICD-10-CM

## 2013-07-11 NOTE — Progress Notes (Signed)
07/11/13- 30 yoM never smoker referred by Dr. Carlis Abbott - waking up during night and feels tired in mornings.  Had sleep study May 2014. Wakes several times each night. Wife complains of snoring. Okay driving. Some naps. Bedtime midnight, latency 30-60 minutes, up about 7 AM. Weight down 20 pounds in 2 years. No ENT surgery. Aware of heart murmur but no other heart or lung disease. Has perennial and seasonal allergic rhinitis treated with Claritin. Father snored. NPSG 04/15/13- moderate obstructive sleep apnea-AHI 22.5 per hour. CPAP 10 CWP. Weight 198 pounds.  Prior to Admission medications   Medication Sig Start Date End Date Taking? Authorizing Provider  calcitRIOL (ROCALTROL) 0.25 MCG capsule Take 0.25 mcg by mouth daily.   Yes Historical Provider, MD  clomiPHENE (CLOMID) 50 MG tablet Take 25 mg by mouth daily.   Yes Historical Provider, MD  dexlansoprazole (DEXILANT) 60 MG capsule Take 1 capsule (60 mg total) by mouth daily. 01/28/13  Yes Orvil Feil, NP  felodipine (PLENDIL) 10 MG 24 hr tablet Take 10 mg by mouth daily.     Yes Historical Provider, MD  GARLIC PO Take by mouth. When remembers   Yes Historical Provider, MD  hydrocortisone (ANUSOL-HC) 25 MG suppository Place 25 mg rectally as needed. For 12 days 11/25/12  Yes Danie Binder, MD  nadolol (CORGARD) 40 MG tablet Take 40 mg by mouth daily.     Yes Historical Provider, MD  psyllium (METAMUCIL) 58.6 % powder Take 1 packet by mouth 3 (three) times daily. As needed for constipation   Yes Historical Provider, MD  Red Yeast Rice 600 MG CAPS Take 1 capsule by mouth daily.   Yes Historical Provider, MD  valsartan-hydrochlorothiazide (DIOVAN-HCT) 320-12.5 MG per tablet Take 1 tablet by mouth daily.   Yes Historical Provider, MD  vitamin C (ASCORBIC ACID) 500 MG tablet Take 500 mg by mouth daily.   Yes Historical Provider, MD   Past Medical History  Diagnosis Date  . Rectal bleeding 2011    secondary to hemorrhoids  . Adenoma 11/2009    1.1 cm  from TCS  . HTN (hypertension)   . GERD (gastroesophageal reflux disease)   . Kidney stones   . High cholesterol   . Obesity (BMI 30-39.9) DEC 2011 223 LBS  . DM (diabetes mellitus)    Past Surgical History  Procedure Laterality Date  . Hernia repair    . Circumcision    . Colonoscopy  Dec 2010 [facility]    MOD IH, SIMPLE ADENOMA 1.1 CM  . Colonoscopy  11/25/2012    Procedure: COLONOSCOPY;  Surgeon: Danie Binder, MD;  Location: [facility];  Service: Endoscopy;  Laterality: N/A;  9:30   Family History  Problem Relation Age of Onset  . Colon cancer Neg Hx   . Colon polyps Neg Hx   . Lung cancer Father    History   Social History  . Marital Status: Married    Spouse Name: N/A    Number of Children: N/A  . Years of Education: N/A   Occupational History  . Contractor for [employer]    Social History Main Topics  . Smoking status: Never Smoker   . Smokeless tobacco: Never Used     Comment: Never smoked  . Alcohol Use: No  . Drug Use: No  . Sexual Activity: No   Other Topics Concern  . Not on file   Social History Narrative  . No narrative on file   ROS-see HPI Constitutional:  No-   weight loss, night sweats, fevers, chills, +fatigue, lassitude. HEENT:   No-  headaches, difficulty swallowing, tooth/dental problems, sore throat,       No-  sneezing, itching, ear ache, nasal congestion, post nasal drip,  CV:  No-   chest pain, orthopnea, PND, swelling in lower extremities, anasarca, dizziness, palpitations Resp: No-   shortness of breath with exertion or at rest.              No-   productive cough,  No non-productive cough,  No- coughing up of blood.              No-   change in color of mucus.  No- wheezing.   Skin: No-   rash or lesions. GI:  No-   heartburn, indigestion, abdominal pain, nausea, vomiting, diarrhea,                 change in bowel habits, loss of appetite GU: No-   dysuria, change in color of urine, no urgency or frequency.  No- flank pain. MS:  No-    joint pain or swelling.  No- decreased range of motion.  No- back pain. Neuro-     nothing unusual Psych:  No- change in mood or affect. No depression or anxiety.  No memory loss.  OBJ- Physical Exam General- Alert, Oriented, Affect-appropriate, Distress- none acute, overweight Skin- rash-none, lesions- none, excoriation- none Lymphadenopathy- none Head- atraumatic            Eyes- Gross vision intact, PERRLA, conjunctivae and secretions clear            Ears- Hearing, canals-normal            Nose- + turbinate edema, no-Septal dev, mucus, polyps, erosion, perforation             Throat- Mallampati III , mucosa clear , drainage- none, tonsils- atrophic Neck- flexible , trachea midline, no stridor , thyroid nl, carotid no bruit Chest - symmetrical excursion , unlabored           Heart/CV- RRR , no murmur heard , no gallop  , no rub, nl s1 s2                           - JVD- none , edema- none, stasis changes- none, varices- none           Lung- clear to P&A, wheeze- none, cough- none , dullness-none, rub- none           Chest wall-  Abd- tender-no, distended-no, bowel sounds-present, HSM- no Br/ Gen/ Rectal- Not done, not indicated Extrem- cyanosis- none, clubbing, none, atrophy- none, strength- nl Neuro- grossly intact to observation

## 2013-07-11 NOTE — Patient Instructions (Addendum)
Order- DME new CPAP autotitrate 5-20 x 7 days for pressure recommendation, mask of choice, supplies, humidifier    DXS OSA   Please call as needed

## 2013-07-27 DIAGNOSIS — G4733 Obstructive sleep apnea (adult) (pediatric): Secondary | ICD-10-CM | POA: Insufficient documentation

## 2013-07-27 DIAGNOSIS — J302 Other seasonal allergic rhinitis: Secondary | ICD-10-CM | POA: Insufficient documentation

## 2013-07-27 NOTE — Assessment & Plan Note (Signed)
NPSG 04/15/13- moderate obstructive sleep apnea-AHI 22.5 per hour. CPAP 10 CWP. Weight 198 pounds. We discussed the physiology and medical concerns of sleep apnea, basic sleep hygiene, importance of weight control, contribution from his nasal congestion, driving responsibility and treatment options. Plan-CPAP autotitration

## 2013-07-27 NOTE — Assessment & Plan Note (Signed)
Intermittent problem, currently clear.

## 2013-07-30 ENCOUNTER — Institutional Professional Consult (permissible substitution): Payer: 59 | Admitting: Pulmonary Disease

## 2013-08-20 ENCOUNTER — Telehealth: Payer: Self-pay | Admitting: Internal Medicine

## 2013-08-20 NOTE — Telephone Encounter (Signed)
CDY has an opening on 09/04/13. Called, spoke with pt.  This appt wasn't offered to pt.  This date is ok with him.  Therefore, we have scheduled him ot see CDY on 09/04/13 at 9:45 am.  Pt aware and voiced no further questions or concerns at this time.

## 2013-08-28 ENCOUNTER — Ambulatory Visit: Payer: 59 | Admitting: Internal Medicine

## 2013-09-04 ENCOUNTER — Ambulatory Visit (INDEPENDENT_AMBULATORY_CARE_PROVIDER_SITE_OTHER): Payer: 59 | Admitting: Internal Medicine

## 2013-09-04 ENCOUNTER — Encounter: Payer: Self-pay | Admitting: Internal Medicine

## 2013-09-04 VITALS — BP 124/70 | HR 57 | Ht 69.0 in | Wt 211.4 lb

## 2013-09-04 DIAGNOSIS — G4733 Obstructive sleep apnea (adult) (pediatric): Secondary | ICD-10-CM

## 2013-09-04 NOTE — Patient Instructions (Signed)
Order- DME Advanced- change CPAP to fixed 13      Dx OSA  Please call as needed

## 2013-09-04 NOTE — Progress Notes (Signed)
07/11/13- 36 yoM never smoker referred by Dr. Carlis Abbott - waking up during night and feels tired in mornings.  Had sleep study May 2014. Wakes several times each night. Wife complains of snoring. Okay driving. Some naps. Bedtime midnight, latency 30-60 minutes, up about 7 AM. Weight down 20 pounds in 2 years. No ENT surgery. Aware of heart murmur but no other heart or lung disease. Has perennial and seasonal allergic rhinitis treated with Claritin. Father snored. NPSG 04/15/13- moderate obstructive sleep apnea-AHI 22.5 per hour. CPAP 10 CWP. Weight 198 pounds.  09/04/13- 19 yoM never smoker followed for OSA.   FOLLOWS [id number] CPAP auto/ Advanced every night,avg. 7.9 hrs.,at times not feeling so well rested Download suggests fixed pressure could be 13. He feels used to CPAP now. We discussed sleep habits and schedule.  ROS-see HPI Constitutional:   No-   weight loss, night sweats, fevers, chills, +fatigue, lassitude. HEENT:   No-  headaches, difficulty swallowing, tooth/dental problems, sore throat,       No-  sneezing, itching, ear ache, nasal congestion, post nasal drip,  CV:  No-   chest pain, orthopnea, PND, swelling in lower extremities, anasarca, dizziness, palpitations Resp: No-   shortness of breath with exertion or at rest.              No-   productive cough,  No non-productive cough,  No- coughing up of blood.              No-   change in color of mucus.  No- wheezing.   Skin: No-   rash or lesions. GI:  No-   heartburn, indigestion, abdominal pain, nausea, vomiting,  GU:  MS:  No-   joint pain or swelling.   Neuro-     nothing unusual Psych:  No- change in mood or affect. No depression or anxiety.  No memory loss.  OBJ- Physical Exam General- Alert, Oriented, Affect-appropriate, Distress- none acute, overweight Skin- rash-none, lesions- none, excoriation- none Lymphadenopathy- none Head- atraumatic            Eyes- Gross vision intact, PERRLA, conjunctivae and secretions clear        Ears- Hearing, canals-normal            Nose- + turbinate edema, no-Septal dev, mucus, polyps, erosion, perforation             Throat- Mallampati III , mucosa clear , drainage- none, tonsils- atrophic Neck- flexible , trachea midline, no stridor , thyroid nl, carotid no bruit Chest - symmetrical excursion , unlabored           Heart/CV- RRR , no murmur heard , no gallop  , no rub, nl s1 s2                           - JVD- none , edema- none, stasis changes- none, varices- none           Lung- clear to P&A, wheeze- none, cough- none , dullness-none, rub- none           Chest wall-  Abd-  Br/ Gen/ Rectal- Not done, not indicated Extrem- cyanosis- none, clubbing, none, atrophy- none, strength- nl Neuro- grossly intact to observation

## 2013-09-14 NOTE — Assessment & Plan Note (Signed)
Good control and compliance with CPAP. We're going to try change 13 Plan-DME change CPAP to 13

## 2013-11-13 ENCOUNTER — Encounter: Payer: Self-pay | Admitting: Gastroenterology

## 2013-12-18 ENCOUNTER — Encounter (INDEPENDENT_AMBULATORY_CARE_PROVIDER_SITE_OTHER): Payer: Self-pay

## 2013-12-18 ENCOUNTER — Ambulatory Visit (INDEPENDENT_AMBULATORY_CARE_PROVIDER_SITE_OTHER): Payer: 59 | Admitting: Gastroenterology

## 2013-12-18 ENCOUNTER — Encounter: Payer: Self-pay | Admitting: Gastroenterology

## 2013-12-18 VITALS — BP 137/73 | HR 56 | Temp 98.0°F | Ht 70.0 in | Wt 208.8 lb

## 2013-12-18 DIAGNOSIS — K219 Gastro-esophageal reflux disease without esophagitis: Secondary | ICD-10-CM

## 2013-12-18 DIAGNOSIS — K648 Other hemorrhoids: Secondary | ICD-10-CM | POA: Insufficient documentation

## 2013-12-18 DIAGNOSIS — R1310 Dysphagia, unspecified: Secondary | ICD-10-CM

## 2013-12-18 NOTE — Progress Notes (Signed)
Subjective:    Patient ID: Bruce Weber, male    DOB: 1951-08-01, 63 y.o.   MRN: [id number]  Bruce Spurling, MD  HPI HAS BEEN WORKING ON BP. LOST WEIGHT BUT HAS PLATEAUED. SUPPOSITORIES WORK BUT SOMETIMES GETS HEMORRHOID PROBLEMS: PRESENCE OF HEMORRHOIDS. FEELS IT DROP DOWN AND IT GOES BACK UP EVENTUALLY. TOOK A PICK OF HIS HEMORRHOID. CAN GET SUBJECTIVE CHILLS OFF AND ON. PT DENIES FEVER, CHILLS, BRBPR, nausea, vomiting, melena, diarrhea, constipation, abd pain, problems swallowing, problems with sedation, OR heartburn or indigestion. HAS RIGHT GROIN PAIN.  Past Medical History  Diagnosis Date  . Rectal bleeding 2011    secondary to hemorrhoids  . Adenoma 11/2009    1.1 cm from TCS  . HTN (hypertension)   . GERD (gastroesophageal reflux disease)   . Kidney stones   . High cholesterol   . Obesity (BMI 30-39.9) DEC 2011 223 LBS  . DM (diabetes mellitus)    Past Surgical History  Procedure Laterality Date  . Hernia repair    . Circumcision    . Colonoscopy  Dec 2010 [facility]BPR    MOD IH, SIMPLE ADENOMA 1.1 CM  . Colonoscopy  11/25/2012    Procedure: COLONOSCOPY;  Surgeon: Danie Binder, MD;  Location: [facility];  Service: Endoscopy;  Laterality: N/A;  9:30   Allergies  Allergen Reactions  . Dye Fdc Blue [Brilliant Blue Fcf (Fd&C Blue #1)]     PT UNSURE WHICH DYE  . Iodinated Diagnostic Agents Hives    30 YEARS AGO   Current Outpatient Prescriptions  Medication Sig Dispense Refill  . calcitRIOL (ROCALTROL) 0.25 MCG capsule Take 0.25 mcg by mouth daily.      . clomiPHENE (CLOMID) 50 MG tablet Take 25 mg by mouth daily.      Marland Kitchen dexlansoprazole (DEXILANT) 60 MG capsule Take 1 capsule (60 mg total) by mouth daily.    . felodipine (PLENDIL) 5 MG 24 hr tablet Take 5 mg by mouth daily.      Marland Kitchen GARLIC PO Take by mouth. When remembers      . hydrocortisone (ANUSOL-HC) 25 MG suppository Place 25 mg rectally as needed. For 12 days      . nadolol (CORGARD) 40 MG tablet Take 40 mg by  mouth daily.        . psyllium (METAMUCIL) 58.6 % powder Take 1 packet by mouth 3 (three) times daily. As needed for constipation      . Red Yeast Rice 600 MG CAPS Take 1 capsule by mouth daily.      . valsartan-hydrochlorothiazide (DIOVAN-HCT) 320-12.5 MG per tablet Take 1 tablet by mouth daily.      . vitamin C (ASCORBIC ACID) 500 MG tablet Take 500 mg by mouth daily.          Review of Systems C/O RIGHT GROIN PAIN SINCE INCREASING EXERCISE.    Objective:   Physical Exam  Vitals reviewed. Constitutional: He is oriented to person, place, and time. He appears well-nourished. No distress.  HENT:  Head: Normocephalic and atraumatic.  Mouth/Throat: No oropharyngeal exudate.  Eyes: Pupils are equal, round, and reactive to light. No scleral icterus.  Neck: Normal range of motion. Neck supple.  Cardiovascular: Normal rate, regular rhythm and normal heart sounds.   Pulmonary/Chest: Effort normal and breath sounds normal. No respiratory distress.  Abdominal: Soft. Bowel sounds are normal. He exhibits no distension.  Genitourinary: Rectal exam shows internal hemorrhoid. Rectal exam shows no external hemorrhoid.   PROCEDURE TECHNIQUE:  BENEFITS RISK EXPLAINED TO PT. ANOSCOPY PERFORMED. BULGING INTERNAL HEMORRHOID COLUMN IN THE R POSTERIOR AND ANTERIOR COLUMNS. ONE CRH BAND PLACED IN RIGHT ANTERIOR POSITION. POST-BANDING RECTAL EXAM REVEALED GOOD PLACEMENT. EXAM NON-TENDER.   Musculoskeletal: He exhibits no edema.  Lymphadenopathy:    He has no cervical adenopathy.  Neurological: He is alert and oriented to person, place, and time.  NO FOCAL DEFICITS   Psychiatric: He has a normal mood and affect.          Assessment & Plan:

## 2013-12-18 NOTE — Assessment & Plan Note (Signed)
OPV IN 2 WEEKS EAT FIBER DRINK WATER SPEND LESS THAN 5 MINS ON THE TOILET

## 2013-12-18 NOTE — Patient Instructions (Signed)
FOLLOW A HIGH FIBER DIET. AVOID ITEMS THAT CAUSE BLOATING AND GAS. SEE INFO BELOW.  DRINK WATER TO KEEP HER URINE LIGHT YELLOW.  SIT FOR LESS THAN 5 MINUTES ON THE COMMODE.  FOLLOW UP IN JAN 29 AT 1045 AM.    High-Fiber Diet A high-fiber diet changes your normal diet to include more whole grains, legumes, fruits, and vegetables. Changes in the diet involve replacing refined carbohydrates with unrefined foods. The calorie level of the diet is essentially unchanged. The Dietary Reference Intake (recommended amount) for adult males is 38 grams per day. For adult females, it is 25 grams per day. Pregnant and lactating women should consume 28 grams of fiber per day. Fiber is the intact part of a plant that is not broken down during digestion. Functional fiber is fiber that has been isolated from the plant to provide a beneficial effect in the body. PURPOSE  Increase stool bulk.   Ease and regulate bowel movements.   Lower cholesterol.  INDICATIONS THAT YOU NEED MORE FIBER  Constipation and hemorrhoids.   Uncomplicated diverticulosis (intestine condition) and irritable bowel syndrome.   Weight management.   As a protective measure against hardening of the arteries (atherosclerosis), diabetes, and cancer.   DO NOT USE WITH:  Acute diverticulitis (intestine infection).   Partial small bowel obstructions.   Complicated diverticular disease involving bleeding, rupture (perforation), or abscess (boil, furuncle).   Presence of autonomic neuropathy (nerve damage) or gastroparesis (stomach cannot empty itself).    GUIDELINES FOR INCREASING FIBER IN THE DIET  Start adding fiber to the diet slowly. A gradual increase of about 5 more grams (2 slices of whole-wheat bread, 2 servings of most fruits or vegetables, or 1 bowl of high-fiber cereal) per day is best. Too rapid an increase in fiber may result in constipation, flatulence, and bloating.   Drink enough water and fluids to keep your  urine clear or pale yellow. Water, juice, or caffeine-free drinks are recommended. Not drinking enough fluid may cause constipation.   Eat a variety of high-fiber foods rather than one type of fiber.   Try to increase your intake of fiber through using high-fiber foods rather than fiber pills or supplements that contain small amounts of fiber.   The goal is to change the types of food eaten. Do not supplement your present diet with high-fiber foods, but replace foods in your present diet.    INCLUDE A VARIETY OF FIBER SOURCES  Replace refined and processed grains with whole grains, canned fruits with fresh fruits, and incorporate other fiber sources. White rice, white breads, and most bakery goods contain little or no fiber.   Brown whole-grain rice, buckwheat oats, and many fruits and vegetables are all good sources of fiber. These include: broccoli, Brussels sprouts, cabbage, cauliflower, beets, sweet potatoes, white potatoes (skin on), carrots, tomatoes, eggplant, squash, berries, fresh fruits, and dried fruits.   Cereals appear to be the richest source of fiber. Cereal fiber is found in whole grains and bran. Bran is the fiber-rich outer coat of cereal grain, which is largely removed in refining. In whole-grain cereals, the bran remains. In breakfast cereals, the largest amount of fiber is found in those with "bran" in their names. The fiber content is sometimes indicated on the label.   You may need to include additional fruits and vegetables each day.   In baking, for 1 cup white flour, you may use the following substitutions:   1 cup whole-wheat flour minus 2 tablespoons.  1/2 cup white flour plus 1/2 cup whole-wheat flour.

## 2013-12-18 NOTE — Assessment & Plan Note (Signed)
SX IMPROVED.  CONTINUE TO MONITOR SYMPTOMS. 

## 2013-12-18 NOTE — Assessment & Plan Note (Signed)
SX FAIRLY WELL CONTROLLED.  CONTINUE TO MONITOR SYMPTOMS.

## 2014-01-05 ENCOUNTER — Encounter: Payer: Self-pay | Admitting: Internal Medicine

## 2014-01-05 ENCOUNTER — Ambulatory Visit (INDEPENDENT_AMBULATORY_CARE_PROVIDER_SITE_OTHER): Payer: 59 | Admitting: Internal Medicine

## 2014-01-05 VITALS — BP 128/70 | HR 61 | Ht 69.0 in | Wt 211.9 lb

## 2014-01-05 DIAGNOSIS — G4733 Obstructive sleep apnea (adult) (pediatric): Secondary | ICD-10-CM

## 2014-01-05 NOTE — Patient Instructions (Signed)
Order- Advanced reduce CPAP to 11    Dx OSA  Please call as needed

## 2014-01-05 NOTE — Progress Notes (Signed)
07/11/13- 5 yoM never smoker referred by Dr. Carlis Abbott - waking up during night and feels tired in mornings.  Had sleep study May 2014. Wakes several times each night. Wife complains of snoring. Okay driving. Some naps. Bedtime midnight, latency 30-60 minutes, up about 7 AM. Weight down 20 pounds in 2 years. No ENT surgery. Aware of heart murmur but no other heart or lung disease. Has perennial and seasonal allergic rhinitis treated with Claritin. Father snored. NPSG 04/15/13- moderate obstructive sleep apnea-AHI 22.5 per hour. CPAP 10 CWP. Weight 198 pounds.  09/04/13- 63 yoM never smoker followed for OSA.   FOLLOWS [id number] CPAP auto/ Advanced every night,avg. 7.9 hrs.,at times not feeling so well rested Download suggests fixed pressure could be 13. He feels used to CPAP now. We discussed sleep habits and schedule.  01/05/14- 33 yoM never smoker followed for OSA FOLLOWS FOR: wears CPAP every night for about 6.5 hours; ? pressure too high. DME is [facility].  CPAP 13/Advanced feels like too much pressure. He admits falling asleep sometimes before he puts the mask on. We discussed the humidifier.]  ROS-see HPI Constitutional:   No-   weight loss, night sweats, fevers, chills, +fatigue, lassitude. HEENT:   No-  headaches, difficulty swallowing, tooth/dental problems, sore throat,       No-  sneezing, itching, ear ache, nasal congestion, post nasal drip,  CV:  No-   chest pain, orthopnea, PND, swelling in lower extremities, anasarca, dizziness, palpitations Resp: No-   shortness of breath with exertion or at rest.              No-   productive cough,  No non-productive cough,  No- coughing up of blood.              No-   change in color of mucus.  No- wheezing.   Skin: No-   rash or lesions. GI:  No-   heartburn, indigestion, abdominal pain, nausea, vomiting,  GU:  MS:  No-   joint pain or swelling.   Neuro-     nothing unusual Psych:  No- change in mood or affect. No depression or anxiety.  No memory  loss.  OBJ- Physical Exam General- Alert, Oriented, Affect-appropriate, Distress- none acute, overweight Skin- rash-none, lesions- none, excoriation- none Lymphadenopathy- none Head- atraumatic            Eyes- Gross vision intact, PERRLA, conjunctivae and secretions clear            Ears- Hearing, canals-normal            Nose- + turbinate edema, no-Septal dev, mucus, polyps, erosion, perforation             Throat- Mallampati III , mucosa clear , drainage- none, tonsils- atrophic Neck- flexible , trachea midline, no stridor , thyroid nl, carotid no bruit Chest - symmetrical excursion , unlabored           Heart/CV- RRR , no murmur heard , no gallop  , no rub, nl s1 s2                           - JVD- none , edema- none, stasis changes- none, varices- none           Lung- clear to P&A, wheeze- none, cough- none , dullness-none, rub- none           Chest wall-  Abd-  Br/ Gen/ Rectal- Not done, not indicated Extrem-  cyanosis- none, clubbing, none, atrophy- none, strength- nl Neuro- grossly intact to observation

## 2014-01-08 ENCOUNTER — Encounter: Payer: Self-pay | Admitting: Gastroenterology

## 2014-01-08 ENCOUNTER — Ambulatory Visit (INDEPENDENT_AMBULATORY_CARE_PROVIDER_SITE_OTHER): Payer: 59 | Admitting: Gastroenterology

## 2014-01-08 VITALS — BP 156/89 | HR 59 | Temp 98.4°F | Wt 207.4 lb

## 2014-01-08 DIAGNOSIS — K648 Other hemorrhoids: Secondary | ICD-10-CM

## 2014-01-08 NOTE — Patient Instructions (Signed)
FOLLOW A HIGH FIBER DIET. AVOID ITEMS THAT CAUSE BLOATING AND GAS.   DRINK WATER TO KEEP HER URINE LIGHT YELLOW.  USE FIBER POWDER THREE TIMES A DAY.  SIT FOR LESS THAN 5 MINUTES ON THE COMMODE.  FOLLOW UP IN 3 WEEKS.

## 2014-01-08 NOTE — Progress Notes (Signed)
SYMPTOMS: LEAKING OR SOILING, NO PROBLEMS AFTER LAST BAND PLACED  CONSTIPATION: NO DIARRHEA: NO  STRAINS WITH BMs: YES  TIME SPENT ON TOILET: 5 MINS TISSUE POKES OUT OF RECTUM: YES- GOES BACK BY ITSELF FIBER SUPPLEMENTS: YES-METAMUCIL GLASSES OF WATER/DAY: 6-8: YES   ADDITIONAL QUESTIONS:  LATEX ALLERGY: NO PREGNANT: NO ERECTILE DYSFUNCTION MEDS OR NITRATES: NO ANTICOAGULATION/ANTIPLATELET MEDS: NO DIAGNOSED WITH CROHN'S DISEASE, PROCTITIS, PORTAL HTN, OR ANAL/RECTAL CA: NO TAKING IMMUNOSUPPRESSANTS/XRT: NO   PLAN: 1. R POS BAND TODAY   PROCEDURE TECHNIQUE: BENEFITS RISK EXPLAINED TO PT.  ONE CRH BAND PLACED IN RIGHT POSTERIOR POSITION. POST-BANDING RECTAL EXAM REVEALED GOOD PLACEMENT. EXAM NON-TENDER.

## 2014-01-08 NOTE — Assessment & Plan Note (Signed)
L LAT BAND IN 3 WEEKS

## 2014-01-28 ENCOUNTER — Ambulatory Visit (INDEPENDENT_AMBULATORY_CARE_PROVIDER_SITE_OTHER): Payer: 59 | Admitting: Gastroenterology

## 2014-01-28 ENCOUNTER — Encounter: Payer: Self-pay | Admitting: Gastroenterology

## 2014-01-28 ENCOUNTER — Encounter (INDEPENDENT_AMBULATORY_CARE_PROVIDER_SITE_OTHER): Payer: Self-pay

## 2014-01-28 VITALS — BP 155/87 | HR 59 | Temp 97.6°F | Wt 209.8 lb

## 2014-01-28 DIAGNOSIS — K648 Other hemorrhoids: Secondary | ICD-10-CM

## 2014-01-28 NOTE — Assessment & Plan Note (Addendum)
SX IMPROVED SIGNIFICANTLY.  L LAT BAND TODAY OPV IN 3 MO E15 HEMORRHOID FOLLOW UP.

## 2014-01-28 NOTE — Progress Notes (Signed)
SYMPTOMS: No RECTAL BLEEDING, PRESSURE, PAIN, OR BURNING. MILD rectal ITCHING.  CONSTIPATION: NO DIARRHEA: NO  STRAINS WITH BMs: NO  TIME SPENT ON TOILET: 5 MINS TISSUE POKES OUT OF RECTUM: NO FIBER SUPPLEMENTS: YES  GLASSES OF WATER/DAY: 6-8: YES   ADDITIONAL QUESTIONS:  LATEX ALLERGY: NO PREGNANT: NO ERECTILE DYSFUNCTION MEDS OR NITRATES: NO ANTICOAGULATION/ANTIPLATELET MEDS: NO DIAGNOSED WITH CROHN'S DISEASE, PROCTITIS, PORTAL HTN, OR ANAL/RECTAL CA: NO TAKING IMMUNOSUPPRESSANTS/XRT: NO  PLAN:  1. L LAT BAND TODAY

## 2014-01-28 NOTE — Patient Instructions (Signed)
DRINK WATER TO KEEP YOUR URINE LIGHT YELLOW.  FOLLOW A HIGH FIBER DIET. SEE INFO BELOW.  FOLLOW UP IN 3 MOS.

## 2014-01-29 ENCOUNTER — Other Ambulatory Visit: Payer: Self-pay | Admitting: Gastroenterology

## 2014-01-29 NOTE — Assessment & Plan Note (Signed)
CPAP pressure was changed to 13 based on download but is uncomfortable.  Plan- DME/Advanced to reduce CPAP pressure to 11. Our goal of "all night, every night " was discussed

## 2014-02-26 ENCOUNTER — Encounter (INDEPENDENT_AMBULATORY_CARE_PROVIDER_SITE_OTHER): Payer: Self-pay

## 2014-02-26 ENCOUNTER — Ambulatory Visit (HOSPITAL_COMMUNITY)
Admission: RE | Admit: 2014-02-26 | Discharge: 2014-02-26 | Disposition: A | Discharge status: Home / Self Care | Payer: 59 | Source: Ambulatory Visit | Attending: Internal Medicine | Admitting: Internal Medicine

## 2014-02-26 ENCOUNTER — Other Ambulatory Visit: Payer: Self-pay | Admitting: Internal Medicine

## 2014-02-26 DIAGNOSIS — R52 Pain, unspecified: Secondary | ICD-10-CM

## 2014-02-26 DIAGNOSIS — M76899 Other specified enthesopathies of unspecified lower limb, excluding foot: Secondary | ICD-10-CM | POA: Insufficient documentation

## 2014-02-26 DIAGNOSIS — M25559 Pain in unspecified hip: Secondary | ICD-10-CM | POA: Insufficient documentation

## 2014-02-26 DIAGNOSIS — M169 Osteoarthritis of hip, unspecified: Secondary | ICD-10-CM | POA: Insufficient documentation

## 2014-02-26 DIAGNOSIS — M1610 Unilateral primary osteoarthritis, unspecified hip: Secondary | ICD-10-CM | POA: Insufficient documentation

## 2014-04-30 ENCOUNTER — Encounter: Payer: Self-pay | Admitting: *Deleted

## 2014-06-03 ENCOUNTER — Ambulatory Visit: Payer: 59 | Admitting: Gastroenterology

## 2014-06-17 ENCOUNTER — Encounter: Payer: Self-pay | Admitting: Gastroenterology

## 2014-06-17 ENCOUNTER — Ambulatory Visit (INDEPENDENT_AMBULATORY_CARE_PROVIDER_SITE_OTHER): Payer: Medicare Other | Admitting: Gastroenterology

## 2014-06-17 ENCOUNTER — Encounter (INDEPENDENT_AMBULATORY_CARE_PROVIDER_SITE_OTHER): Payer: Self-pay

## 2014-06-17 VITALS — BP 140/82 | HR 60 | Temp 98.0°F | Resp 20 | Ht 69.0 in | Wt 214.0 lb

## 2014-06-17 DIAGNOSIS — R109 Unspecified abdominal pain: Secondary | ICD-10-CM

## 2014-06-17 DIAGNOSIS — K648 Other hemorrhoids: Secondary | ICD-10-CM | POA: Diagnosis not present

## 2014-06-17 NOTE — Progress Notes (Signed)
   Subjective:    Patient ID: Bruce Weber, male    DOB: Sep 01, 1951, 63 y.o.   MRN: [id number]  [name], MD  HPI QUESTIONS ABOUT PAIN IN LEFT CHEST AND WHEN HE TOUCHES IT IT'S TENDER. THINKS HIS HEMORRHOIDS ARE BACK. FEELS THEM DROP OUT AND THEN GPO BACK IN. NO RECTAL PAIN, PRESSURE, OR BLEEDING. CAN FEEL THEM. FEELS BP HIGH ONLY WHEN HE COMES TO DOCTOR.    Past Medical History  Diagnosis Date  . Rectal bleeding 2011    secondary to hemorrhoids  . Adenoma 11/2009    1.1 cm from TCS  . HTN (hypertension)   . GERD (gastroesophageal reflux disease)   . Kidney stones   . High cholesterol   . Obesity (BMI 30-39.9) DEC 2011 223 LBS  . DM (diabetes mellitus)    Past Surgical History  Procedure Laterality Date  . Hernia repair    . Circumcision    . Colonoscopy  Dec 2010 [facility]    MOD IH, SIMPLE ADENOMA 1.1 CM  . Colonoscopy  11/25/2012    Procedure: COLONOSCOPY;  Surgeon: Danie Binder, MD;  Location: [facility];  Service: Endoscopy;  Laterality: N/A;  9:30    Review of Systems     Objective:   Physical Exam  Vitals reviewed. Constitutional: He is oriented to person, place, and time. He appears well-developed and well-nourished. No distress.  HENT:  Head: Normocephalic and atraumatic.  Mouth/Throat: Oropharynx is clear and moist. No oropharyngeal exudate.  Eyes: Pupils are equal, round, and reactive to light. No scleral icterus.  Neck: Normal range of motion. Neck supple.  Cardiovascular: Normal rate, regular rhythm and normal heart sounds.   Pulmonary/Chest: Effort normal and breath sounds normal. No respiratory distress.  Abdominal: Soft. Bowel sounds are normal. He exhibits no distension. There is no tenderness.  Genitourinary: Rectal exam shows internal hemorrhoid. Rectal exam shows no external hemorrhoid, no mass, no tenderness and anal tone normal.     Musculoskeletal: He exhibits no edema.  Neurological: He is alert and oriented to person, place, and  time.  NO FOCAL DEFICITS   Psychiatric:  SLIGHTLY ANXIOUS MOOD, NL AFFECT           Assessment & Plan:   PROCEDURE TECHNIQUE: BENEFITS RISK EXPLAINED TO PT.  ANOSCOPY PERFORMED. ONE CRH BAND PLACED IN RIGHT POSTERIOR AND R ANTERIOR POSTION POSITION. POST-BANDING RECTAL EXAM REVEALED MILD TTP. BAND ADJUSTED. EXAM NON-TENDER.

## 2014-06-17 NOTE — Patient Instructions (Signed)
FOLLOW A HIGH FIBER DIET. AVOID ITEMS THAT CAUSE BLOATING AND GAS. SEE INFO BELOW.  DRINK WATER TO KEEP HER URINE LIGHT YELLOW.  SIT FOR LESS THAN 2 MINUTES ON THE COMMODE.  FOLLOW UP IN AUG 2015.    High-Fiber Diet A high-fiber diet changes your normal diet to include more whole grains, legumes, fruits, and vegetables. Changes in the diet involve replacing refined carbohydrates with unrefined foods. The calorie level of the diet is essentially unchanged. The Dietary Reference Intake (recommended amount) for adult males is 38 grams per day. For adult females, it is 25 grams per day. Pregnant and lactating women should consume 28 grams of fiber per day. Fiber is the intact part of a plant that is not broken down during digestion. Functional fiber is fiber that has been isolated from the plant to provide a beneficial effect in the body. PURPOSE  Increase stool bulk.   Ease and regulate bowel movements.   Lower cholesterol.  INDICATIONS THAT YOU NEED MORE FIBER  Constipation and hemorrhoids.   Uncomplicated diverticulosis (intestine condition) and irritable bowel syndrome.   Weight management.   As a protective measure against hardening of the arteries (atherosclerosis), diabetes, and cancer.   DO NOT USE WITH:  Acute diverticulitis (intestine infection).   Partial small bowel obstructions.   Complicated diverticular disease involving bleeding, rupture (perforation), or abscess (boil, furuncle).   Presence of autonomic neuropathy (nerve damage) or gastroparesis (stomach cannot empty itself).    GUIDELINES FOR INCREASING FIBER IN THE DIET  Start adding fiber to the diet slowly. A gradual increase of about 5 more grams (2 slices of whole-wheat bread, 2 servings of most fruits or vegetables, or 1 bowl of high-fiber cereal) per day is best. Too rapid an increase in fiber may result in constipation, flatulence, and bloating.   Drink enough water and fluids to keep your urine  clear or pale yellow. Water, juice, or caffeine-free drinks are recommended. Not drinking enough fluid may cause constipation.   Eat a variety of high-fiber foods rather than one type of fiber.   Try to increase your intake of fiber through using high-fiber foods rather than fiber pills or supplements that contain small amounts of fiber.   The goal is to change the types of food eaten. Do not supplement your present diet with high-fiber foods, but replace foods in your present diet.    INCLUDE A VARIETY OF FIBER SOURCES  Replace refined and processed grains with whole grains, canned fruits with fresh fruits, and incorporate other fiber sources. White rice, white breads, and most bakery goods contain little or no fiber.   Brown whole-grain rice, buckwheat oats, and many fruits and vegetables are all good sources of fiber. These include: broccoli, Brussels sprouts, cabbage, cauliflower, beets, sweet potatoes, white potatoes (skin on), carrots, tomatoes, eggplant, squash, berries, fresh fruits, and dried fruits.   Cereals appear to be the richest source of fiber. Cereal fiber is found in whole grains and bran. Bran is the fiber-rich outer coat of cereal grain, which is largely removed in refining. In whole-grain cereals, the bran remains. In breakfast cereals, the largest amount of fiber is found in those with "bran" in their names. The fiber content is sometimes indicated on the label.   You may need to include additional fruits and vegetables each day.   In baking, for 1 cup white flour, you may use the following substitutions:   1 cup whole-wheat flour minus 2 tablespoons.   1/2 cup white  flour plus 1/2 cup whole-wheat flour.

## 2014-06-17 NOTE — Progress Notes (Signed)
Pt is aware of OV on 8/26 at 0830 with SF

## 2014-06-17 NOTE — Assessment & Plan Note (Signed)
MUSCULOSKELETAL LUQ/CHEST PAIN, UNCHANGED.  CONTINUE TO MONITOR SYMPTOMS.

## 2014-06-17 NOTE — Assessment & Plan Note (Signed)
r pos/ant band today  DRINK WATER TO KEEP YOUR URINE LIGHT YELLOW.  EAT FIBER OPV IN 4 WEEKS

## 2014-06-30 DIAGNOSIS — M9981 Other biomechanical lesions of cervical region: Secondary | ICD-10-CM | POA: Diagnosis not present

## 2014-06-30 DIAGNOSIS — M546 Pain in thoracic spine: Secondary | ICD-10-CM | POA: Diagnosis not present

## 2014-06-30 DIAGNOSIS — M999 Biomechanical lesion, unspecified: Secondary | ICD-10-CM | POA: Diagnosis not present

## 2014-06-30 DIAGNOSIS — M4000 Postural kyphosis, site unspecified: Secondary | ICD-10-CM | POA: Diagnosis not present

## 2014-06-30 DIAGNOSIS — M47817 Spondylosis without myelopathy or radiculopathy, lumbosacral region: Secondary | ICD-10-CM | POA: Diagnosis not present

## 2014-06-30 DIAGNOSIS — M4712 Other spondylosis with myelopathy, cervical region: Secondary | ICD-10-CM | POA: Diagnosis not present

## 2014-07-03 ENCOUNTER — Encounter: Payer: Self-pay | Admitting: Internal Medicine

## 2014-07-03 ENCOUNTER — Ambulatory Visit (INDEPENDENT_AMBULATORY_CARE_PROVIDER_SITE_OTHER): Payer: Medicare Other | Admitting: Internal Medicine

## 2014-07-03 VITALS — BP 132/84 | HR 63 | Ht 70.0 in | Wt 214.0 lb

## 2014-07-03 DIAGNOSIS — G4733 Obstructive sleep apnea (adult) (pediatric): Secondary | ICD-10-CM | POA: Diagnosis not present

## 2014-07-03 NOTE — Progress Notes (Signed)
07/11/13- 74 yoM never smoker referred by Dr. Carlis Abbott - waking up during night and feels tired in mornings.  Had sleep study May 2014. Wakes several times each night. Wife complains of snoring. Okay driving. Some naps. Bedtime midnight, latency 30-60 minutes, up about 7 AM. Weight down 20 pounds in 2 years. No ENT surgery. Aware of heart murmur but no other heart or lung disease. Has perennial and seasonal allergic rhinitis treated with Claritin. Father snored. NPSG 04/15/13- moderate obstructive sleep apnea-AHI 22.5 per hour. CPAP 10 CWP. Weight 198 pounds.  09/04/13- 60 yoM never smoker followed for OSA.   FOLLOWS [id number] CPAP auto/ Advanced every night,avg. 7.9 hrs.,at times not feeling so well rested Download suggests fixed pressure could be 13. He feels used to CPAP now. We discussed sleep habits and schedule.  01/05/14- 70 yoM never smoker followed for OSA FOLLOWS FOR: wears CPAP every night for about 6.5 hours; ? pressure too high. DME is [facility].  CPAP 13/Advanced feels like too much pressure. He admits falling asleep sometimes before he puts the mask on. We discussed the humidifier.]  07/03/14- 63 yoM never smoker followed for OSA Wearing cpap 13/ Advanced every night for apporx 6.5 hours.  Nasal mask comes loose at times.  Would like to verify pressure is set correctly.  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 Gets 6-8 hours of sleep and is comfortable at night with occasional Tylenol  ROS-see HPI Constitutional:   No-   weight loss, night sweats, fevers, chills, +fatigue, lassitude. HEENT:   No-  headaches, difficulty swallowing, tooth/dental problems, sore throat,       No-  sneezing, itching, ear ache, nasal congestion, post nasal drip,  CV:  No-   chest pain, orthopnea, PND, swelling in lower extremities, anasarca, dizziness, palpitations Resp: No-   shortness of breath with exertion or at rest.              No-   productive cough,  No non-productive cough,  No-  coughing up of blood.              No-   change in color of mucus.  No- wheezing.   Skin: No-   rash or lesions. GI:  No-   heartburn, indigestion, abdominal pain, nausea, vomiting,  GU:  MS:  No-   joint pain or swelling.   Neuro-     nothing unusual Psych:  No- change in mood or affect. No depression or anxiety.  No memory loss.  OBJ- Physical Exam General- Alert, Oriented, Affect-appropriate, Distress- none acute, overweight Skin- rash-none, lesions- none, excoriation- none Lymphadenopathy- none Head- atraumatic            Eyes- Gross vision intact, PERRLA, conjunctivae and secretions clear            Ears- Hearing, canals-normal            Nose- + turbinate edema, no-Septal dev, mucus, polyps, erosion, perforation             Throat- Mallampati III-IV , mucosa clear , drainage- none, tonsils- atrophic Neck- flexible , trachea midline, no stridor , thyroid nl, carotid no bruit Chest - symmetrical excursion , unlabored           Heart/CV- RRR , no murmur heard , no gallop  , no rub, nl s1 s2                           -  JVD- none , edema- none, stasis changes- none, varices- none           Lung- clear to P&A, wheeze- none, cough- none , dullness-none, rub- none           Chest wall-  Abd-  Br/ Gen/ Rectal- Not done, not indicated Extrem- cyanosis- none, clubbing, none, atrophy- none, strength- nl Neuro- grossly intact to observation     

## 2014-07-03 NOTE — Patient Instructions (Signed)
Order- DME Advanced- autotitrate CPAP 8-20 x 7 days for pressure recommendation    Dx OSA  Ok to take melatonin by itself or with another sleep aid, including tylenol, if needed

## 2014-07-05 NOTE — Assessment & Plan Note (Signed)
Has been using CPAP 13 GERD our records but he is not sure this is providing adequate control. Plan-encouraged weight loss. Auto titrate for pressure recommendation

## 2014-07-06 ENCOUNTER — Ambulatory Visit: Payer: 59 | Admitting: Internal Medicine

## 2014-07-28 DIAGNOSIS — M47817 Spondylosis without myelopathy or radiculopathy, lumbosacral region: Secondary | ICD-10-CM | POA: Diagnosis not present

## 2014-07-28 DIAGNOSIS — M4000 Postural kyphosis, site unspecified: Secondary | ICD-10-CM | POA: Diagnosis not present

## 2014-07-28 DIAGNOSIS — M4712 Other spondylosis with myelopathy, cervical region: Secondary | ICD-10-CM | POA: Diagnosis not present

## 2014-07-28 DIAGNOSIS — M999 Biomechanical lesion, unspecified: Secondary | ICD-10-CM | POA: Diagnosis not present

## 2014-07-28 DIAGNOSIS — M9981 Other biomechanical lesions of cervical region: Secondary | ICD-10-CM | POA: Diagnosis not present

## 2014-07-28 DIAGNOSIS — M546 Pain in thoracic spine: Secondary | ICD-10-CM | POA: Diagnosis not present

## 2014-07-29 ENCOUNTER — Encounter: Payer: Self-pay | Admitting: Gastroenterology

## 2014-07-29 ENCOUNTER — Ambulatory Visit (INDEPENDENT_AMBULATORY_CARE_PROVIDER_SITE_OTHER): Payer: Medicare Other | Admitting: Gastroenterology

## 2014-07-29 VITALS — BP 150/84 | HR 55 | Temp 97.7°F | Ht 70.0 in | Wt 217.0 lb

## 2014-07-29 DIAGNOSIS — K648 Other hemorrhoids: Secondary | ICD-10-CM | POA: Diagnosis not present

## 2014-07-29 NOTE — Assessment & Plan Note (Signed)
Sx FAIRLY WELL controlled.  DRINK WATER EAT FIBER PREPARATION H PRN ITCHING OR PRESSURE OPV IN 6 MOS

## 2014-07-29 NOTE — Progress Notes (Signed)
REMINDER IN EPIC °

## 2014-07-29 NOTE — Progress Notes (Signed)
Subjective:    Patient ID: Bruce Weber, male    DOB: 09-Oct-1951, 63 y.o.   MRN: [id number]  Bruce Spurling, MD  HPI NOTICED HEMORRHOID YESTERDAY WHEN SITTING ON THE THRONE. NO BLEEDING. RECTAL PRESSURE, PAIN, ITCHING, BURNING, OR SOILING. TOOK BP THIS AM AND BP WAS SBP 119. RARE HEARTBURN AND LEFT SIDED CHEST PAIN. BMs: COUPLE TIMES A DAY. METAMUCAL DAILY FOR PAST 2-3 DAYS. STILL WORKING 66 HRS IN A WEEK.  PT DENIES FEVER, CHILLS, HEMATOCHEZIA, HEMATEMESIS, nausea, vomiting, melena, diarrhea, SHORTNESS OF BREATH,  CHANGE IN BOWEL IN HABITS, constipation, abdominal pain, OR problems swallowing  Past Medical History  Diagnosis Date  . Rectal bleeding 2011    secondary to hemorrhoids  . Adenoma 11/2009    1.1 cm from TCS  . HTN (hypertension)   . GERD (gastroesophageal reflux disease)   . Kidney stones   . High cholesterol   . Obesity (BMI 30-39.9) DEC 2011 223 LBS  . DM (diabetes mellitus)    Past Surgical History  Procedure Laterality Date  . Hernia repair    . Circumcision    . Colonoscopy  Dec 2010 [facility]    MOD IH, SIMPLE ADENOMA 1.1 CM  . Colonoscopy  11/25/2012    Procedure: COLONOSCOPY;  Surgeon: Danie Binder, MD;  Location: [facility];  Service: Endoscopy;  Laterality: N/A;  9:30   Allergies  Allergen Reactions  . Dye Fdc Blue [Brilliant Blue Fcf (Fd&C Blue #1)]     PT UNSURE WHICH DYE  . Iodinated Diagnostic Agents Hives    30 YEARS AGO    Current Outpatient Prescriptions  Medication Sig Dispense Refill  . allopurinol (ZYLOPRIM) 100 MG tablet Take 100 mg by mouth 2 (two) times daily.    . calcitRIOL (ROCALTROL) 0.25 MCG capsule Take 0.25 mcg by mouth daily.    . clomiPHENE (CLOMID) 50 MG tablet Take 25 mg by mouth daily. Takes 1/2 tablet every other day    . DEXILANT 60 MG capsule TAKE 1 CAPSULE EVERY DAY    . felodipine (PLENDIL) 5 MG 24 hr tablet Take 5 mg by mouth daily.    Marland Kitchen GARLIC PO Take by mouth. When remembers    . nadolol (CORGARD) 40 MG tablet  Take 40 mg by mouth daily.      . psyllium (METAMUCIL) 58.6 % powder Take 1 packet by mouth daily. As needed for constipation    . Red Yeast Rice 600 MG CAPS Take 1 capsule by mouth daily.    . valsartan-hydrochlorothiazide (DIOVAN-HCT) 320-12.5 MG per tablet Take 1 tablet by mouth daily.    . vitamin C (ASCORBIC ACID) 500 MG tablet Take 500 mg by mouth daily.      Review of Systems     Objective:   Physical Exam  Vitals reviewed. Constitutional: He is oriented to person, place, and time. He appears well-nourished. No distress.  HENT:  Head: Normocephalic and atraumatic.  Mouth/Throat: Oropharynx is clear and moist. No oropharyngeal exudate.  Eyes: Pupils are equal, round, and reactive to light. No scleral icterus.  Neck: Normal range of motion. Neck supple.  Cardiovascular: Normal rate, regular rhythm and normal heart sounds.   Pulmonary/Chest: Effort normal and breath sounds normal. No respiratory distress.  Abdominal: Soft. Bowel sounds are normal. He exhibits no distension. There is no tenderness.  Musculoskeletal: He exhibits no edema.  Lymphadenopathy:    He has no cervical adenopathy.  Neurological: He is alert and oriented to person, place, and time.  NO FOCAL DEFICITS   Psychiatric: He has a normal mood and affect.          Assessment & Plan:

## 2014-07-29 NOTE — Patient Instructions (Signed)
  DRINK WATER TO KEEP YOUR URINE LIGHT YELLOW.  CONTINUE METAMUCIL.  SIT FOR LESS THAN 5 MINS ON THE TOILET.  FOLLOW A HIGH FIBER DIET. AVOID ITEMS THAT CAUSE BLOATING & GAS.  PREPARATION H AS NEEDED FOR RECTAL ITCHING OR PRESSURE   FOLLOW UP IN 6 MOS. MERRY CHRISTMAS AND HAPPY NEW YEAR!

## 2014-08-25 DIAGNOSIS — M9981 Other biomechanical lesions of cervical region: Secondary | ICD-10-CM | POA: Diagnosis not present

## 2014-08-25 DIAGNOSIS — M546 Pain in thoracic spine: Secondary | ICD-10-CM | POA: Diagnosis not present

## 2014-08-25 DIAGNOSIS — M999 Biomechanical lesion, unspecified: Secondary | ICD-10-CM | POA: Diagnosis not present

## 2014-08-25 DIAGNOSIS — M47817 Spondylosis without myelopathy or radiculopathy, lumbosacral region: Secondary | ICD-10-CM | POA: Diagnosis not present

## 2014-08-25 DIAGNOSIS — M4712 Other spondylosis with myelopathy, cervical region: Secondary | ICD-10-CM | POA: Diagnosis not present

## 2014-08-25 DIAGNOSIS — M4000 Postural kyphosis, site unspecified: Secondary | ICD-10-CM | POA: Diagnosis not present

## 2014-08-31 DIAGNOSIS — I129 Hypertensive chronic kidney disease with stage 1 through stage 4 chronic kidney disease, or unspecified chronic kidney disease: Secondary | ICD-10-CM | POA: Diagnosis not present

## 2014-08-31 DIAGNOSIS — D631 Anemia in chronic kidney disease: Secondary | ICD-10-CM | POA: Diagnosis not present

## 2014-08-31 DIAGNOSIS — Z8739 Personal history of other diseases of the musculoskeletal system and connective tissue: Secondary | ICD-10-CM | POA: Diagnosis not present

## 2014-08-31 DIAGNOSIS — Z23 Encounter for immunization: Secondary | ICD-10-CM | POA: Diagnosis not present

## 2014-08-31 DIAGNOSIS — N2581 Secondary hyperparathyroidism of renal origin: Secondary | ICD-10-CM | POA: Diagnosis not present

## 2014-08-31 DIAGNOSIS — N183 Chronic kidney disease, stage 3 unspecified: Secondary | ICD-10-CM | POA: Diagnosis not present

## 2014-08-31 DIAGNOSIS — N039 Chronic nephritic syndrome with unspecified morphologic changes: Secondary | ICD-10-CM | POA: Diagnosis not present

## 2014-09-08 DIAGNOSIS — E119 Type 2 diabetes mellitus without complications: Secondary | ICD-10-CM | POA: Diagnosis not present

## 2014-09-08 DIAGNOSIS — L662 Folliculitis decalvans: Secondary | ICD-10-CM | POA: Diagnosis not present

## 2014-09-08 DIAGNOSIS — I10 Essential (primary) hypertension: Secondary | ICD-10-CM | POA: Diagnosis not present

## 2014-10-07 DIAGNOSIS — M546 Pain in thoracic spine: Secondary | ICD-10-CM | POA: Diagnosis not present

## 2014-10-07 DIAGNOSIS — M47816 Spondylosis without myelopathy or radiculopathy, lumbar region: Secondary | ICD-10-CM | POA: Diagnosis not present

## 2014-10-07 DIAGNOSIS — M9902 Segmental and somatic dysfunction of thoracic region: Secondary | ICD-10-CM | POA: Diagnosis not present

## 2014-10-07 DIAGNOSIS — M9901 Segmental and somatic dysfunction of cervical region: Secondary | ICD-10-CM | POA: Diagnosis not present

## 2014-10-07 DIAGNOSIS — M47812 Spondylosis without myelopathy or radiculopathy, cervical region: Secondary | ICD-10-CM | POA: Diagnosis not present

## 2014-10-07 DIAGNOSIS — M9903 Segmental and somatic dysfunction of lumbar region: Secondary | ICD-10-CM | POA: Diagnosis not present

## 2015-01-01 ENCOUNTER — Encounter: Payer: Self-pay | Admitting: Gastroenterology

## 2015-01-08 ENCOUNTER — Ambulatory Visit (INDEPENDENT_AMBULATORY_CARE_PROVIDER_SITE_OTHER): Payer: Medicare Other | Admitting: Internal Medicine

## 2015-01-08 ENCOUNTER — Encounter: Payer: Self-pay | Admitting: Internal Medicine

## 2015-01-08 VITALS — BP 124/78 | HR 53 | Ht 69.5 in | Wt 217.6 lb

## 2015-01-08 DIAGNOSIS — J302 Other seasonal allergic rhinitis: Secondary | ICD-10-CM

## 2015-01-08 DIAGNOSIS — G4733 Obstructive sleep apnea (adult) (pediatric): Secondary | ICD-10-CM

## 2015-01-08 DIAGNOSIS — J3089 Other allergic rhinitis: Principal | ICD-10-CM

## 2015-01-08 DIAGNOSIS — J309 Allergic rhinitis, unspecified: Secondary | ICD-10-CM | POA: Diagnosis not present

## 2015-01-08 NOTE — Progress Notes (Signed)
07/11/13- 57 yoM never smoker referred by Dr. Carlis Abbott - waking up during night and feels tired in mornings.  Had sleep study May 2014. Wakes several times each night. Wife complains of snoring. Okay driving. Some naps. Bedtime midnight, latency 30-60 minutes, up about 7 AM. Weight down 20 pounds in 2 years. No ENT surgery. Aware of heart murmur but no other heart or lung disease. Has perennial and seasonal allergic rhinitis treated with Claritin. Father snored. NPSG 04/15/13- moderate obstructive sleep apnea-AHI 22.5 per hour. CPAP 10 CWP. Weight 198 pounds.  09/04/13- 63 yoM never smoker followed for OSA.   FOLLOWS [id number] CPAP auto/ Advanced every night,avg. 7.9 hrs.,at times not feeling so well rested Download suggests fixed pressure could be 13. He feels used to CPAP now. We discussed sleep habits and schedule.  01/05/14- 46 yoM never smoker followed for OSA FOLLOWS FOR: wears CPAP every night for about 6.5 hours; ? pressure too high. DME is [facility].  CPAP 13/Advanced feels like too much pressure. He admits falling asleep sometimes before he puts the mask on. We discussed the humidifier.]  07/03/14- 63 yoM never smoker followed for OSA Wearing cpap 13/ Advanced every night for apporx 6.5 hours.  Nasal mask comes loose at times.  Would like to verify pressure is set correctly.  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 Gets 6-8 hours of sleep and is comfortable at night with occasional Tylenol  01/08/15- 20 yoM never smoker followed for OSA FOLLOWS FOR: Wears CPAP auto 8-20/ Advanced every night for about 7-7.5 hours; DME is [facility].No new supplies needed at this time. Worse nasal congestion in winter time. Using OTC sinus pills. Occasional Tylenol for sleep.  ROS-see HPI Constitutional:   No-   weight loss, night sweats, fevers, chills, +fatigue, lassitude. HEENT:   No-  headaches, difficulty swallowing, tooth/dental problems, sore throat,       No-  sneezing, itching, ear ache,  nasal congestion, post nasal drip,  CV:  No-   chest pain, orthopnea, PND, swelling in lower extremities, anasarca, dizziness, palpitations Resp: No-   shortness of breath with exertion or at rest.              No-   productive cough,  No non-productive cough,  No- coughing up of blood.              No-   change in color of mucus.  No- wheezing.   Skin: No-   rash or lesions. GI:  No-   heartburn, indigestion, abdominal pain, nausea, vomiting,  GU:  MS:  No-   joint pain or swelling.   Neuro-     nothing unusual Psych:  No- change in mood or affect. No depression or anxiety.  No memory loss.  OBJ- Physical Exam General- Alert, Oriented, Affect-appropriate, Distress- none acute, overweight Skin- rash-none, lesions- none, excoriation- none Lymphadenopathy- none Head- atraumatic            Eyes- Gross vision intact, PERRLA, conjunctivae and secretions clear            Ears- Hearing, canals-normal            Nose- + turbinate edema, no-Septal dev, mucus, polyps, erosion, perforation             Throat- Mallampati III-IV , mucosa clear , drainage- none, tonsils- atrophic Neck- flexible , trachea midline, no stridor , thyroid nl, carotid no bruit Chest - symmetrical excursion , unlabored  Heart/CV- RRR , no murmur heard , no gallop  , no rub, nl s1 s2                           - JVD- none , edema- none, stasis changes- none, varices- none           Lung- clear to P&A, wheeze- none, cough- none , dullness-none, rub- none           Chest wall-  Abd-  Br/ Gen/ Rectal- Not done, not indicated Extrem- cyanosis- none, clubbing, none, atrophy- none, strength- nl Neuro- grossly intact to observation

## 2015-01-08 NOTE — Patient Instructions (Signed)
We can continue CPAP autoPAP 8-20, Advanced  You might try otc nasal spray Flonase   1-2 puffs each nostril once daily at bedtime. See if this begins to keep your nose working better.   Please call as needed

## 2015-01-11 DIAGNOSIS — E119 Type 2 diabetes mellitus without complications: Secondary | ICD-10-CM | POA: Diagnosis not present

## 2015-01-11 DIAGNOSIS — I10 Essential (primary) hypertension: Secondary | ICD-10-CM | POA: Diagnosis not present

## 2015-01-11 DIAGNOSIS — M109 Gout, unspecified: Secondary | ICD-10-CM | POA: Diagnosis not present

## 2015-01-12 NOTE — Assessment & Plan Note (Signed)
He is comfortable with AutoPap 8-20s we will leave him there. Comfort measures and goals reviewed.

## 2015-01-12 NOTE — Assessment & Plan Note (Signed)
Increased nasal congestion in the wintertime may be partly vasomotor or dryness. Plan-refill Flonase or use saline nasal spray

## 2015-01-27 DIAGNOSIS — D631 Anemia in chronic kidney disease: Secondary | ICD-10-CM | POA: Diagnosis not present

## 2015-01-27 DIAGNOSIS — Z8739 Personal history of other diseases of the musculoskeletal system and connective tissue: Secondary | ICD-10-CM | POA: Diagnosis not present

## 2015-01-27 DIAGNOSIS — N183 Chronic kidney disease, stage 3 (moderate): Secondary | ICD-10-CM | POA: Diagnosis not present

## 2015-01-27 DIAGNOSIS — N2581 Secondary hyperparathyroidism of renal origin: Secondary | ICD-10-CM | POA: Diagnosis not present

## 2015-01-27 DIAGNOSIS — I129 Hypertensive chronic kidney disease with stage 1 through stage 4 chronic kidney disease, or unspecified chronic kidney disease: Secondary | ICD-10-CM | POA: Diagnosis not present

## 2015-02-12 ENCOUNTER — Encounter: Payer: Self-pay | Admitting: Internal Medicine

## 2015-02-20 ENCOUNTER — Other Ambulatory Visit: Payer: Self-pay | Admitting: Gastroenterology

## 2015-02-23 ENCOUNTER — Other Ambulatory Visit: Payer: Self-pay | Admitting: Gastroenterology

## 2015-02-24 ENCOUNTER — Encounter: Payer: Self-pay | Admitting: Gastroenterology

## 2015-02-24 ENCOUNTER — Ambulatory Visit (INDEPENDENT_AMBULATORY_CARE_PROVIDER_SITE_OTHER): Payer: Medicare Other | Admitting: Gastroenterology

## 2015-02-24 VITALS — BP 134/75 | HR 69 | Temp 98.3°F | Ht 69.0 in | Wt 217.8 lb

## 2015-02-24 DIAGNOSIS — K648 Other hemorrhoids: Secondary | ICD-10-CM

## 2015-02-24 DIAGNOSIS — K219 Gastro-esophageal reflux disease without esophagitis: Secondary | ICD-10-CM | POA: Diagnosis not present

## 2015-02-24 DIAGNOSIS — R1310 Dysphagia, unspecified: Secondary | ICD-10-CM

## 2015-02-24 MED ORDER — DEXLANSOPRAZOLE 60 MG PO CPDR: 1.0000 | DELAYED_RELEASE_CAPSULE | Freq: Every day | ORAL | Status: DC

## 2015-02-24 NOTE — Progress Notes (Signed)
Subjective:    Patient ID: Bruce Weber, male    DOB: Nov 01, 1951, 64 y.o.   MRN: [id number]  [name], MD  HPI Had a flare of dysphagia for 4-5 days. NO KNOWN TRIGGERS. LAST FEW WEEKS EATING FAST FOODS & PIZZA OR TWO. NO  EATING REGULAR. HEMORRHOIDS NOT GONE BUT EVER NOW AND THEN HAS ITCHING. NO MEDS TRIED. NO RECTAL BLEEDING, PRESSURE, PAIN, BURNING, MAY HAVE TO WIPE SEVERAL TIMES TO GET CLEAN. SOILING. BMs: AT LEAST 1X/DAY. RARE DIARRHEA, L CHEST, OR LUQ ABD PAIN. TAKING FIBER.  PT DENIES FEVER, CHILLS, nausea, vomiting, melena, diarrhea, CHEST PAIN, SHORTNESS OF BREATH,  CHANGE IN BOWEL IN HABITS, OR heartburn or indigestion.  Past Medical History  Diagnosis Date  . Rectal bleeding 2011    secondary to hemorrhoids  . Adenoma 11/2009    1.1 cm from TCS  . HTN (hypertension)   . GERD (gastroesophageal reflux disease)   . Kidney stones   . High cholesterol   . Obesity (BMI 30-39.9) DEC 2011 223 LBS  . DM (diabetes mellitus)    Past Surgical History  Procedure Laterality Date  . Hernia repair    . Circumcision    . Colonoscopy  Dec 2010 [facility]    MOD IH, SIMPLE ADENOMA 1.1 CM  . Colonoscopy  11/25/2012    SLF: 1. 66mm SIMPLE ADENOMA 2. Mod TICS  4. Large internal hemorrhoids.    Allergies  Allergen Reactions  . Dye Fdc Blue [Brilliant Blue Fcf (Fd&C Blue #1)]     PT UNSURE WHICH DYE  . Iodinated Diagnostic Agents Hives    30 YEARS AGO    Current Outpatient Prescriptions  Medication Sig Dispense Refill  . allopurinol (ZYLOPRIM) 100 MG tablet Take 100 mg by mouth 2 (two) times daily.    . calcitRIOL (ROCALTROL) 0.25 MCG capsule Take 0.25 mcg by mouth daily.    Marland Kitchen DEXILANT 60 MG capsule TAKE 1 CAPSULE EVERY DAY    . felodipine (PLENDIL) 5 MG 24 hr tablet Take 5 mg by mouth daily.    Marland Kitchen GARLIC PO Take by mouth. When remembers    . nadolol (CORGARD) 40 MG tablet Take 40 mg by mouth daily.      . psyllium (METAMUCIL) 58.6 % powder Take 1 packet by mouth daily. As needed  for constipation    . Red Yeast Rice 600 MG CAPS Take 1 capsule by mouth daily.    . valsartan-hydrochlorothiazide (DIOVAN-HCT) 320-12.5 MG per tablet Take 1 tablet by mouth daily.    . vitamin C (ASCORBIC ACID) 500 MG tablet Take 500 mg by mouth daily.    . clomiPHENE (CLOMID) 50 MG tablet Take 25 mg by mouth daily. Takes 1/2 tablet every other day     Review of Systems     Objective:   Physical Exam  Constitutional: He is oriented to person, place, and time. He appears well-developed and well-nourished. No distress.  HENT:  Head: Normocephalic and atraumatic.  Mouth/Throat: Oropharynx is clear and moist. No oropharyngeal exudate.  Eyes: Pupils are equal, round, and reactive to light. No scleral icterus.  Neck: Normal range of motion. Neck supple.  Cardiovascular: Normal rate, regular rhythm and normal heart sounds.   Pulmonary/Chest: Effort normal and breath sounds normal. No respiratory distress.  Abdominal: Soft. Bowel sounds are normal. He exhibits no distension. There is tenderness. There is no rebound and no guarding.  MILD TTP IN THE EPIGASTRIUM and LLQ  Musculoskeletal: He exhibits no edema.  Lymphadenopathy:    He has no cervical adenopathy.  Neurological: He is alert and oriented to person, place, and time.  NO FOCAL DEFICITS   Psychiatric:  SLIGHTLY ANXIOUS MOOD, NL AFFECT   Vitals reviewed.         Assessment & Plan:

## 2015-02-24 NOTE — Patient Instructions (Signed)
AVOID TRIGGERS FOR REFLUX. SEE INFO BELOW.  FOLLOW A LOW FAT DIET.   TAKE DEXILANT EVERY MORNING.  CONTINUE YOUR WEIGHT LOSS EFFORTS. LOSE 20 LBS.  PLEASE CALL IN ONE MONTH IF SWALLOWING NOT IMPROVED AND I  WILL ORDER THE BARIUM SWALLOW.   FOLLOW UP IN 6 MOS     Lifestyle and home remedies TO MANAGE REFLUX/CHEST PAIN/DIFFICULTY SWALLOWING  You may eliminate or reduce the frequency of heartburn by making the following lifestyle changes:  . Control your weight. Being overweight is a major risk factor for heartburn and GERD. Excess pounds put pressure on your abdomen, pushing up your stomach and causing acid to back up into your esophagus.   . Eat smaller meals. 4 TO 6 MEALS A DAY. This reduces pressure on the lower esophageal sphincter, helping to prevent the valve from opening and acid from washing back into your esophagus.   Dolphus Jenny your belt. Clothes that fit tightly around your waist put pressure on your abdomen and the lower esophageal sphincter.   . Eliminate heartburn triggers. Everyone has specific triggers. Common triggers such as fatty or fried foods, spicy food, tomato sauce, carbonated beverages, alcohol, chocolate, mint, garlic, onion, caffeine and nicotine may make heartburn worse.   Marland Kitchen Avoid stooping or bending. Tying your shoes is OK. Bending over for longer periods to weed your garden isn't, especially soon after eating.   . Don't lie down after a meal. Wait at least three to four hours after eating before going to bed, and don't lie down right after eating.   Marland Kitchen PUT THE HEAD OF YOUR BED ON 6 INCH BLOCKS.   Alternative medicine . Several home remedies exist for treating GERD, but they provide only temporary relief. They include drinking baking soda (sodium bicarbonate) added to water or drinking other fluids such as baking soda mixed with cream of tartar and water.  . Although these liquids create temporary relief by neutralizing, washing away or buffering acids,  eventually they aggravate the situation by adding gas and fluid to your stomach, increasing pressure and causing more acid reflux. Further, adding more sodium to your diet may increase your blood pressure and add stress to your heart, and excessive bicarbonate ingestion can alter the acid-base balance in your body.  Low-Fat Diet BREADS, CEREALS, PASTA, RICE, DRIED PEAS, AND BEANS These products are high in carbohydrates and most are low in fat. Therefore, they can be increased in the diet as substitutes for fatty foods. They too, however, contain calories and should not be eaten in excess. Cereals can be eaten for snacks as well as for breakfast.   FRUITS AND VEGETABLES It is good to eat fruits and vegetables. Besides being sources of fiber, both are rich in vitamins and some minerals. They help you get the daily allowances of these nutrients. Fruits and vegetables can be used for snacks and desserts.  MEATS Limit lean meat, chicken, [place], and fish to no more than 6 ounces per day. Beef, Pork, and Lamb Use lean cuts of beef, pork, and lamb. Lean cuts include:  Extra-lean ground beef.  Arm roast.  Sirloin tip.  Center-cut ham.  Round steak.  Loin chops.  Rump roast.  Tenderloin.  Trim all fat off the outside of meats before cooking. It is not necessary to severely decrease the intake of red meat, but lean choices should be made. Lean meat is rich in protein and contains a highly absorbable form of iron. Premenopausal women, in particular, should avoid reducing  lean red meat because this could increase the risk for low red blood cells (iron-deficiency anemia).  Chicken and [place] These are good sources of protein. The fat of poultry can be reduced by removing the skin and underlying fat layers before cooking. Chicken and [place] can be substituted for lean red meat in the diet. Poultry should not be fried or covered with high-fat sauces. Fish and Shellfish Fish is a good source of protein.  Shellfish contain cholesterol, but they usually are low in saturated fatty acids. The preparation of fish is important. Like chicken and [place], they should not be fried or covered with high-fat sauces. EGGS Egg whites contain no fat or cholesterol. They can be eaten often. Try 1 to 2 egg whites instead of whole eggs in recipes or use egg substitutes that do not contain yolk. MILK AND DAIRY PRODUCTS Use skim or 1% milk instead of 2% or whole milk. Decrease whole milk, natural, and processed cheeses. Use nonfat or low-fat (2%) cottage cheese or low-fat cheeses made from vegetable oils. Choose nonfat or low-fat (1 to 2%) yogurt. Experiment with evaporated skim milk in recipes that call for heavy cream. Substitute low-fat yogurt or low-fat cottage cheese for sour cream in dips and salad dressings. Have at least 2 servings of low-fat dairy products, such as 2 glasses of skim (or 1%) milk each day to help get your daily calcium intake. FATS AND OILS Reduce the total intake of fats, especially saturated fat. Butterfat, lard, and beef fats are high in saturated fat and cholesterol. These should be avoided as much as possible. Vegetable fats do not contain cholesterol, but certain vegetable fats, such as coconut oil, palm oil, and palm kernel oil are very high in saturated fats. These should be limited. These fats are often used in bakery goods, processed foods, popcorn, oils, and nondairy creamers. Vegetable shortenings and some peanut butters contain hydrogenated oils, which are also saturated fats. Read the labels on these foods and check for saturated vegetable oils. Unsaturated vegetable oils and fats do not raise blood cholesterol. However, they should be limited because they are fats and are high in calories. Total fat should still be limited to 30% of your daily caloric intake. Desirable liquid vegetable oils are corn oil, cottonseed oil, olive oil, canola oil, safflower oil, soybean oil, and sunflower oil.  Peanut oil is not as good, but small amounts are acceptable. Buy a heart-healthy tub margarine that has no partially hydrogenated oils in the ingredients. Mayonnaise and salad dressings often are made from unsaturated fats, but they should also be limited because of their high calorie and fat content. Seeds, nuts, peanut butter, olives, and avocados are high in fat, but the fat is mainly the unsaturated type. These foods should be limited mainly to avoid excess calories and fat. OTHER EATING TIPS Snacks  Most sweets should be limited as snacks. They tend to be rich in calories and fats, and their caloric content outweighs their nutritional value. Some good choices in snacks are graham crackers, melba toast, soda crackers, bagels (no egg), English muffins, fruits, and vegetables. These snacks are preferable to snack crackers, [place] fries, TORTILLA CHIPS, and POTATO chips. Popcorn should be air-popped or cooked in small amounts of liquid vegetable oil. Desserts Eat fruit, low-fat yogurt, and fruit ices instead of pastries, cake, and cookies. Sherbet, angel food cake, gelatin dessert, frozen low-fat yogurt, or other frozen products that do not contain saturated fat (pure fruit juice bars, frozen ice pops) are also  acceptable.  COOKING METHODS Choose those methods that use little or no fat. They include: Poaching.  Braising.  Steaming.  Grilling.  Baking.  Stir-frying.  Broiling.  Microwaving.  Foods can be cooked in a nonstick pan without added fat, or use a nonfat cooking spray in regular cookware. Limit fried foods and avoid frying in saturated fat. Add moisture to lean meats by using water, broth, cooking wines, and other nonfat or low-fat sauces along with the cooking methods mentioned above. Soups and stews should be chilled after cooking. The fat that forms on top after a few hours in the refrigerator should be skimmed off. When preparing meals, avoid using excess salt. Salt can contribute to  raising blood pressure in some people.  EATING AWAY FROM HOME Order entres, potatoes, and vegetables without sauces or butter. When meat exceeds the size of a deck of cards (3 to 4 ounces), the rest can be taken home for another meal. Choose vegetable or fruit salads and ask for low-calorie salad dressings to be served on the side. Use dressings sparingly. Limit high-fat toppings, such as bacon, crumbled eggs, cheese, sunflower seeds, and olives. Ask for heart-healthy tub margarine instead of butter.

## 2015-02-24 NOTE — Assessment & Plan Note (Signed)
SYMPTOMS FAIRLY WELL CONTROLLED AFTER 5 BANDS PLACED IN 2015.  CONTINUE TO MONITOR SYMPTOMS. MAY NEED SURGICAL REFERRAL.

## 2015-02-24 NOTE — Assessment & Plan Note (Signed)
SYMPTOMS NOT IDEALLY CONTROLLED. MOST LIKE DUE TO GERD, LESS LIKELY STRICTURE.  LOW FAT DIET DEXILANT QAM. PA NEEDED. CONTINUE YOUR WEIGHT LOSS EFFORTS. CONSIDER BPE. FOLLOW UP IN 6 MOS.

## 2015-02-24 NOTE — Progress Notes (Signed)
ON RECALL LIST  °

## 2015-02-24 NOTE — Progress Notes (Signed)
CC'ED TO PCP 

## 2015-02-24 NOTE — Assessment & Plan Note (Signed)
SYMPTOMS FAIRLY WELL CONTROLLED.  AVOID TRIGGERS LOW FAT DIET DEXILANT QAM CONTINUE YOUR WEIGHT LOSS EFFORTS. CONSIDER BPE. FOLLOW UP IN 6 MOS

## 2015-03-31 DIAGNOSIS — E291 Testicular hypofunction: Secondary | ICD-10-CM | POA: Diagnosis not present

## 2015-03-31 DIAGNOSIS — N200 Calculus of kidney: Secondary | ICD-10-CM | POA: Diagnosis not present

## 2015-03-31 DIAGNOSIS — N5201 Erectile dysfunction due to arterial insufficiency: Secondary | ICD-10-CM | POA: Diagnosis not present

## 2015-03-31 DIAGNOSIS — R351 Nocturia: Secondary | ICD-10-CM | POA: Diagnosis not present

## 2015-03-31 DIAGNOSIS — N400 Enlarged prostate without lower urinary tract symptoms: Secondary | ICD-10-CM | POA: Diagnosis not present

## 2015-03-31 DIAGNOSIS — N401 Enlarged prostate with lower urinary tract symptoms: Secondary | ICD-10-CM | POA: Diagnosis not present

## 2015-04-26 DIAGNOSIS — N189 Chronic kidney disease, unspecified: Secondary | ICD-10-CM | POA: Diagnosis not present

## 2015-04-26 DIAGNOSIS — I129 Hypertensive chronic kidney disease with stage 1 through stage 4 chronic kidney disease, or unspecified chronic kidney disease: Secondary | ICD-10-CM | POA: Diagnosis not present

## 2015-04-26 DIAGNOSIS — N183 Chronic kidney disease, stage 3 (moderate): Secondary | ICD-10-CM | POA: Diagnosis not present

## 2015-04-26 DIAGNOSIS — D631 Anemia in chronic kidney disease: Secondary | ICD-10-CM | POA: Diagnosis not present

## 2015-04-26 DIAGNOSIS — E118 Type 2 diabetes mellitus with unspecified complications: Secondary | ICD-10-CM | POA: Diagnosis not present

## 2015-05-10 DIAGNOSIS — E1129 Type 2 diabetes mellitus with other diabetic kidney complication: Secondary | ICD-10-CM | POA: Diagnosis not present

## 2015-05-10 DIAGNOSIS — I10 Essential (primary) hypertension: Secondary | ICD-10-CM | POA: Diagnosis not present

## 2015-05-10 DIAGNOSIS — E291 Testicular hypofunction: Secondary | ICD-10-CM | POA: Diagnosis not present

## 2015-05-10 DIAGNOSIS — E119 Type 2 diabetes mellitus without complications: Secondary | ICD-10-CM | POA: Diagnosis not present

## 2015-05-10 DIAGNOSIS — K210 Gastro-esophageal reflux disease with esophagitis: Secondary | ICD-10-CM | POA: Diagnosis not present

## 2015-05-11 DIAGNOSIS — I129 Hypertensive chronic kidney disease with stage 1 through stage 4 chronic kidney disease, or unspecified chronic kidney disease: Secondary | ICD-10-CM | POA: Diagnosis not present

## 2015-05-31 ENCOUNTER — Other Ambulatory Visit: Payer: Self-pay

## 2015-07-05 ENCOUNTER — Encounter: Payer: Self-pay | Admitting: Gastroenterology

## 2015-07-12 DIAGNOSIS — M79672 Pain in left foot: Secondary | ICD-10-CM | POA: Diagnosis not present

## 2015-07-12 DIAGNOSIS — M722 Plantar fascial fibromatosis: Secondary | ICD-10-CM | POA: Diagnosis not present

## 2015-07-12 DIAGNOSIS — M79671 Pain in right foot: Secondary | ICD-10-CM | POA: Diagnosis not present

## 2015-07-14 DIAGNOSIS — E291 Testicular hypofunction: Secondary | ICD-10-CM | POA: Diagnosis not present

## 2015-08-25 DIAGNOSIS — Z23 Encounter for immunization: Secondary | ICD-10-CM | POA: Diagnosis not present

## 2015-08-25 DIAGNOSIS — N183 Chronic kidney disease, stage 3 (moderate): Secondary | ICD-10-CM | POA: Diagnosis not present

## 2015-08-25 DIAGNOSIS — D631 Anemia in chronic kidney disease: Secondary | ICD-10-CM | POA: Diagnosis not present

## 2015-08-25 DIAGNOSIS — E118 Type 2 diabetes mellitus with unspecified complications: Secondary | ICD-10-CM | POA: Diagnosis not present

## 2015-08-25 DIAGNOSIS — I129 Hypertensive chronic kidney disease with stage 1 through stage 4 chronic kidney disease, or unspecified chronic kidney disease: Secondary | ICD-10-CM | POA: Diagnosis not present

## 2015-08-25 DIAGNOSIS — N2581 Secondary hyperparathyroidism of renal origin: Secondary | ICD-10-CM | POA: Diagnosis not present

## 2015-09-02 ENCOUNTER — Ambulatory Visit (INDEPENDENT_AMBULATORY_CARE_PROVIDER_SITE_OTHER): Payer: Medicare Other | Admitting: Gastroenterology

## 2015-09-02 ENCOUNTER — Encounter: Payer: Self-pay | Admitting: Gastroenterology

## 2015-09-02 VITALS — BP 143/86 | HR 62 | Temp 98.2°F | Ht 70.0 in | Wt 207.6 lb

## 2015-09-02 DIAGNOSIS — K648 Other hemorrhoids: Secondary | ICD-10-CM | POA: Diagnosis not present

## 2015-09-02 DIAGNOSIS — K219 Gastro-esophageal reflux disease without esophagitis: Secondary | ICD-10-CM

## 2015-09-02 DIAGNOSIS — R1310 Dysphagia, unspecified: Secondary | ICD-10-CM

## 2015-09-02 NOTE — Patient Instructions (Addendum)
CONTINUE YOUR WEIGHT LOSS EFFORTS.  FOLLOW A LOW FAT DIET.   TAKE DEXILANT EVERY MORNING.  USE PREPARATION H FOUR TIMES  A DAY AS NEEDED TO RELIEVE RECTAL PAIN/PRESSURE/BLEEDING.  PLEASE CALL FOR ADDITIONAL PRESCRIPTION CREAM IF NEEDED.  FOLLOW UP IN 6 MOS.

## 2015-09-02 NOTE — Assessment & Plan Note (Signed)
SYMPTOMS CONTROLLED/RESOLVED.  CONTINUE TO MONITOR SYMPTOMS. 

## 2015-09-02 NOTE — Progress Notes (Signed)
ON RECALL  °

## 2015-09-02 NOTE — Progress Notes (Signed)
Subjective:    Patient ID: Bruce Weber, male    DOB: 28-Nov-1951, 64 y.o.   MRN: [id number]  Bruce Spurling, MD  HPI Having hemorrhoid problems intermittently. Bothered by hemorrhoids time COUPLE TIMES WEEK. HEMORRHOID SYMPTOMS: MOVING UP AND DOWN. BMS: COUPLE TIMES A DAY. METAMUCIL MOST DAYS(#4) W/O METAMUCIL(#6). LOST 10 LBS SINCE MAR 2016. NOW RUNNING EARLY IN THE AM.  PT DENIES FEVER, CHILLS, RECTAL BLEEDING, PRESSURE, PAIN, ITCHING, BURNING, OR SOILING, nausea, vomiting, melena, diarrhea, CHEST PAIN, SHORTNESS OF BREATH,  CHANGE IN BOWEL IN HABITS, constipation, abdominal pain, problems swallowing, OR heartburn or indigestion.  Past Medical History  Diagnosis Date  . Rectal bleeding 2011    secondary to hemorrhoids  . Adenoma 11/2009    1.1 cm from TCS  . HTN (hypertension)   . GERD (gastroesophageal reflux disease)   . Kidney stones   . High cholesterol   . Obesity (BMI 30-39.9) DEC 2011 223 LBS  . DM (diabetes mellitus)    Past Surgical History  Procedure Laterality Date  . Hernia repair    . Circumcision    . Colonoscopy  Dec 2010 [facility]    MOD IH, SIMPLE ADENOMA 1.1 CM  . Colonoscopy  11/25/2012    SLF: 1. Sessile polyp measuring 91mm in size was found at the hepatic flesure; polypectomy was performed using snare cautery 2. Moderate diverticulosis was noted in the ascending colon and sigmoid colon. 3. The colon mucosa was otherwise normal 4. Large internal hemorrhoids.    Allergies  Allergen Reactions  . Dye Fdc Blue [Brilliant Blue Fcf (Fd&C Blue #1)]     PT UNSURE WHICH DYE  . Iodinated Diagnostic Agents Hives    30 YEARS AGO   Current Outpatient Prescriptions  Medication Sig Dispense Refill  . allopurinol (ZYLOPRIM) 100 MG tablet Take 100 mg by mouth 2 (two) times daily.    . calcitRIOL (ROCALTROL) 0.25 MCG capsule Take 0.25 mcg by mouth daily.    . clomiPHENE (CLOMID) 50 MG tablet Take 25 mg by mouth daily. Takes 1/2 tablet every other day    . DEXILANT 60  MG capsule TAKE 1 CAPSULE EVERY DAY    .      Marland Kitchen felodipine (PLENDIL) 5 MG 24 hr tablet Take 5 mg by mouth daily.    Marland Kitchen GARLIC PO Take by mouth. When remembers    . nadolol (CORGARD) 40 MG tablet Take 40 mg by mouth daily.      . psyllium (METAMUCIL) 58.6 % powder Take 1 packet by mouth daily. As needed for constipation    . valsartan-hydrochlorothiazide (DIOVAN-HCT) 320-12.5 MG per tablet Take 1 tablet by mouth daily.    . vitamin C (ASCORBIC ACID) 500 MG tablet Take 500 mg by mouth daily.    . Red Yeast Rice 600 MG CAPS Take 1 capsule by mouth daily.     Review of Systems PER HPI OTHERWISE ALL SYSTEMS ARE NEGATIVE.    Objective:   Physical Exam  Constitutional: He is oriented to person, place, and time. He appears well-developed and well-nourished. No distress.  HENT:  Head: Normocephalic and atraumatic.  Mouth/Throat: Oropharynx is clear and moist. No oropharyngeal exudate.  Eyes: Pupils are equal, round, and reactive to light. No scleral icterus.  Neck: Normal range of motion. Neck supple.  Cardiovascular: Normal rate, regular rhythm and normal heart sounds.   Pulmonary/Chest: Effort normal and breath sounds normal. No respiratory distress.  Abdominal: Soft. Bowel sounds are normal. He exhibits no distension.  There is no tenderness.  Musculoskeletal: He exhibits no edema.  Lymphadenopathy:    He has no cervical adenopathy.  Neurological: He is alert and oriented to person, place, and time.  NO FOCAL DEFICITS  Psychiatric: He has a normal mood and affect.  Vitals reviewed.         Assessment & Plan:

## 2015-09-02 NOTE — Assessment & Plan Note (Signed)
SYMPTOMS FAIRLY WELL CONTROLLED.  CONTINUE TO MONITOR SYMPTOMS. USE PREPARATION H FOUR TIME  A DAY AS NEEDED TO RELIEVE RECTAL PAIN/PRESSURE/BLEEDING. CALL FOR ADDITIONAL PRESCRIPTION CREAM IF NEEDED. FOLLOW UP IN 6 MOS.

## 2015-09-02 NOTE — Progress Notes (Signed)
CC'D TO PCP °

## 2015-09-02 NOTE — Assessment & Plan Note (Signed)
SYMPTOMS FAIRLY WELL CONTROLLED.  AVOID TRIGGERS FOR REFLUX.  FOLLOW A LOW FAT DIET.  TAKE DEXILANT EVERY MORNING. CONTINUE YOUR WEIGHT LOSS EFFORTS.  FOLLOW UP IN 6 MOS

## 2015-09-14 DIAGNOSIS — I10 Essential (primary) hypertension: Secondary | ICD-10-CM | POA: Diagnosis not present

## 2015-09-14 DIAGNOSIS — E291 Testicular hypofunction: Secondary | ICD-10-CM | POA: Diagnosis not present

## 2015-09-14 DIAGNOSIS — E1129 Type 2 diabetes mellitus with other diabetic kidney complication: Secondary | ICD-10-CM | POA: Diagnosis not present

## 2015-09-14 DIAGNOSIS — E119 Type 2 diabetes mellitus without complications: Secondary | ICD-10-CM | POA: Diagnosis not present

## 2015-09-14 DIAGNOSIS — K210 Gastro-esophageal reflux disease with esophagitis: Secondary | ICD-10-CM | POA: Diagnosis not present

## 2016-01-10 ENCOUNTER — Encounter: Payer: Self-pay | Admitting: Internal Medicine

## 2016-01-10 ENCOUNTER — Ambulatory Visit (INDEPENDENT_AMBULATORY_CARE_PROVIDER_SITE_OTHER): Payer: Medicare Other | Admitting: Internal Medicine

## 2016-01-10 VITALS — BP 128/74 | HR 58 | Ht 69.5 in | Wt 214.8 lb

## 2016-01-10 DIAGNOSIS — G4700 Insomnia, unspecified: Secondary | ICD-10-CM | POA: Diagnosis not present

## 2016-01-10 DIAGNOSIS — G4733 Obstructive sleep apnea (adult) (pediatric): Secondary | ICD-10-CM | POA: Diagnosis not present

## 2016-01-10 MED ORDER — ESZOPICLONE 2 MG PO TABS: 2.0000 mg | ORAL_TABLET | Freq: Every evening | ORAL | Status: DC | PRN

## 2016-01-10 NOTE — Progress Notes (Signed)
07/11/13- 74 yoM never smoker referred by Dr. Carlis Abbott - waking up during night and feels tired in mornings.  Had sleep study May 2014. Wakes several times each night. Wife complains of snoring. Okay driving. Some naps. Bedtime midnight, latency 30-60 minutes, up about 7 AM. Weight down 20 pounds in 2 years. No ENT surgery. Aware of heart murmur but no other heart or lung disease. Has perennial and seasonal allergic rhinitis treated with Claritin. Father snored. NPSG 04/15/13- moderate obstructive sleep apnea-AHI 22.5 per hour. CPAP 10 CWP. Weight 198 pounds.  09/04/13- 30 yoM never smoker followed for OSA.   FOLLOWS [id number] CPAP auto/ Advanced every night,avg. 7.9 hrs.,at times not feeling so well rested Download suggests fixed pressure could be 13. He feels used to CPAP now. We discussed sleep habits and schedule.  01/05/14- 35 yoM never smoker followed for OSA FOLLOWS FOR: wears CPAP every night for about 6.5 hours; ? pressure too high. DME is [facility].  CPAP 13/Advanced feels like too much pressure. He admits falling asleep sometimes before he puts the mask on. We discussed the humidifier.]  07/03/14- 63 yoM never smoker followed for OSA Wearing cpap 13/ Advanced every night for apporx 6.5 hours.  Nasal mask comes loose at times.  Would like to verify pressure is set correctly.  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 Gets 6-8 hours of sleep and is comfortable at night with occasional Tylenol  01/08/15- 63 yoM never smoker followed for OSA, complicated by GERD FOLLOWS FOR: Wears CPAP auto 8-20/ Advanced every night for about 7-7.5 hours; DME is [facility].No new supplies needed at this time. Worse nasal congestion in winter time. Using OTC sinus pills. Occasional Tylenol for sleep.  01/10/2016-65 year old male never smoker followed for OSA CPAP auto 8-20/Advanced  FOLLOWS FOR: Wears CPAP every night for about 6-7 hours; DME: [facility]; no new supplies needed at this time. Complaining of  difficulty initiating sleep. Usually okay once he falls asleep-not active. Bedtime around 11 PM and he feels wide awake for an hour or 2. Easily drowsy during the day. No caffeine. He feels comfortable with CPAP and down load looks good. He does not recognize snore and feels pressure is comfortable.  ROS-see HPI Constitutional:   No-   weight loss, night sweats, fevers, chills, +fatigue, lassitude. HEENT:   No-  headaches, difficulty swallowing, tooth/dental problems, sore throat,       No-  sneezing, itching, ear ache, nasal congestion, post nasal drip,  CV:  No-   chest pain, orthopnea, PND, swelling in lower extremities, anasarca, dizziness, palpitations Resp: No-   shortness of breath with exertion or at rest.              No-   productive cough,  No non-productive cough,  No- coughing up of blood.              No-   change in color of mucus.  No- wheezing.   Skin: No-   rash or lesions. GI:  No-   heartburn, indigestion, abdominal pain, nausea, vomiting,  GU:  MS:  No-   joint pain or swelling.   Neuro-     nothing unusual Psych:  No- change in mood or affect. No depression or anxiety.  No memory loss.  OBJ- Physical Exam General- Alert, Oriented, Affect-appropriate, Distress- none acute, + overweight Skin- rash-none, lesions- none, excoriation- none Lymphadenopathy- none Head- atraumatic  Eyes- Gross vision intact, PERRLA, conjunctivae and secretions clear            Ears- Hearing, canals-normal            Nose- no- turbinate edema, no-Septal dev, mucus, polyps, erosion, perforation             Throat- Mallampati III-IV , mucosa clear , drainage- none, tonsils- atrophic Neck- flexible , trachea midline, no stridor , thyroid nl, carotid no bruit Chest - symmetrical excursion , unlabored           Heart/CV- RRR , no murmur heard , no gallop  , no rub, nl s1 s2                           - JVD- none , edema- none, stasis changes- none, varices- none           Lung- clear to  P&A, wheeze- none, cough- none , dullness-none, rub- none           Chest wall-  Abd-  Br/ Gen/ Rectal- Not done, not indicated Extrem- cyanosis- none, clubbing, none, atrophy- none, strength- nl Neuro- grossly intact to observation

## 2016-01-10 NOTE — Patient Instructions (Signed)
Script printed to try lunesta at bedtime for sleep if needed  You could also try melatonin 3-6 mg near bedtime on a regular basis, plus morning exposure to daylight, to help anchor your brain's sense of daytime and night tme  We can continue CPAP  Please call as needed

## 2016-01-11 DIAGNOSIS — M109 Gout, unspecified: Secondary | ICD-10-CM | POA: Diagnosis not present

## 2016-01-11 DIAGNOSIS — G4700 Insomnia, unspecified: Secondary | ICD-10-CM | POA: Insufficient documentation

## 2016-01-11 DIAGNOSIS — I10 Essential (primary) hypertension: Secondary | ICD-10-CM | POA: Diagnosis not present

## 2016-01-11 DIAGNOSIS — E1165 Type 2 diabetes mellitus with hyperglycemia: Secondary | ICD-10-CM | POA: Diagnosis not present

## 2016-01-11 DIAGNOSIS — K210 Gastro-esophageal reflux disease with esophagitis: Secondary | ICD-10-CM | POA: Diagnosis not present

## 2016-01-11 DIAGNOSIS — E291 Testicular hypofunction: Secondary | ICD-10-CM | POA: Diagnosis not present

## 2016-01-11 DIAGNOSIS — E119 Type 2 diabetes mellitus without complications: Secondary | ICD-10-CM | POA: Diagnosis not present

## 2016-01-11 DIAGNOSIS — Z125 Encounter for screening for malignant neoplasm of prostate: Secondary | ICD-10-CM | POA: Diagnosis not present

## 2016-01-11 NOTE — Assessment & Plan Note (Signed)
Difficulty falling asleep for an hour and a half or so after he goes to bed. Discussed sleep habits, melatonin, avoiding daytime naps. Consider use of morning caffeine area morning daylight exposure. Lunesta 2 mg at bedtime for a while to see if that helps.

## 2016-01-11 NOTE — Assessment & Plan Note (Signed)
Good compliance and control based on download. He complains of difficulty falling asleep and notices easy daytime drowsiness. Before we make changes in CPAP, we will work on improving sleep quality

## 2016-01-16 DIAGNOSIS — J018 Other acute sinusitis: Secondary | ICD-10-CM | POA: Diagnosis not present

## 2016-01-17 ENCOUNTER — Encounter: Payer: Self-pay | Admitting: Gastroenterology

## 2016-01-21 ENCOUNTER — Telehealth: Payer: Self-pay | Admitting: Internal Medicine

## 2016-01-21 NOTE — Telephone Encounter (Signed)
Called # provided and spoke with [place]. He was not able to pull pt up by name or DOB.  Last insurance card is from 2015 and was not able to pull up pt. Will sign off message.

## 2016-02-11 DIAGNOSIS — E118 Type 2 diabetes mellitus with unspecified complications: Secondary | ICD-10-CM | POA: Diagnosis not present

## 2016-02-11 DIAGNOSIS — Z8739 Personal history of other diseases of the musculoskeletal system and connective tissue: Secondary | ICD-10-CM | POA: Diagnosis not present

## 2016-02-11 DIAGNOSIS — N2581 Secondary hyperparathyroidism of renal origin: Secondary | ICD-10-CM | POA: Diagnosis not present

## 2016-02-11 DIAGNOSIS — I129 Hypertensive chronic kidney disease with stage 1 through stage 4 chronic kidney disease, or unspecified chronic kidney disease: Secondary | ICD-10-CM | POA: Diagnosis not present

## 2016-02-11 DIAGNOSIS — M1990 Unspecified osteoarthritis, unspecified site: Secondary | ICD-10-CM | POA: Diagnosis not present

## 2016-02-11 DIAGNOSIS — D631 Anemia in chronic kidney disease: Secondary | ICD-10-CM | POA: Diagnosis not present

## 2016-02-11 DIAGNOSIS — G4733 Obstructive sleep apnea (adult) (pediatric): Secondary | ICD-10-CM | POA: Diagnosis not present

## 2016-02-11 DIAGNOSIS — Z9989 Dependence on other enabling machines and devices: Secondary | ICD-10-CM | POA: Diagnosis not present

## 2016-02-11 DIAGNOSIS — Z87442 Personal history of urinary calculi: Secondary | ICD-10-CM | POA: Diagnosis not present

## 2016-02-11 DIAGNOSIS — N183 Chronic kidney disease, stage 3 (moderate): Secondary | ICD-10-CM | POA: Diagnosis not present

## 2016-02-24 ENCOUNTER — Ambulatory Visit: Payer: Medicare Other | Admitting: Gastroenterology

## 2016-03-04 ENCOUNTER — Other Ambulatory Visit: Payer: Self-pay | Admitting: Nurse Practitioner

## 2016-03-22 ENCOUNTER — Ambulatory Visit (INDEPENDENT_AMBULATORY_CARE_PROVIDER_SITE_OTHER): Payer: Medicare Other | Admitting: Gastroenterology

## 2016-03-22 ENCOUNTER — Encounter: Payer: Self-pay | Admitting: Gastroenterology

## 2016-03-22 VITALS — BP 162/82 | HR 61 | Temp 98.8°F | Ht 69.0 in | Wt 215.6 lb

## 2016-03-22 DIAGNOSIS — K648 Other hemorrhoids: Secondary | ICD-10-CM | POA: Diagnosis not present

## 2016-03-22 DIAGNOSIS — K219 Gastro-esophageal reflux disease without esophagitis: Secondary | ICD-10-CM | POA: Diagnosis not present

## 2016-03-22 DIAGNOSIS — R1310 Dysphagia, unspecified: Secondary | ICD-10-CM | POA: Diagnosis not present

## 2016-03-22 MED ORDER — FAMOTIDINE 40 MG PO TABS: 40.0000 mg | ORAL_TABLET | Freq: Every day | ORAL | Status: DC

## 2016-03-22 NOTE — Assessment & Plan Note (Signed)
SYMPTOMS CONTROLLED/RESOLVED.  CONTINUE TO MONITOR SYMPTOMS. 

## 2016-03-22 NOTE — Progress Notes (Signed)
Subjective:    Patient ID: Bruce Weber, male    DOB: [birth year], 65 y.o.   MRN: [id number]  [name], MD  HPI DR. Jimmy Footman wants to know IF HE CAN CHANGE TO ZANTAC, OR PEPCID FROM DEXILANT.  NOTICED Cr HAS BEEN ABNL SINCE 2000.  Weight stable. Rare chills. HEARTBURN CONTROLLED ON DEXILANT. OCCASIONAL LEFT CHEST DISCOMFORT NOT REALLY PAIN.  BMS: PRETTY GOOD. HEMORRHOIDS BEEN PRETTY GOOD. TAKING METAMUCIL. MOTHER PASSED OUT AND NOW SHE LIVES WITH HER SON-WED-SAT HOSPITALIZED DUE TO PASSED OUT AGAIN.  PT DENIES FEVER, CHILLS, HEMATOCHEZIA, nausea, vomiting, melena, diarrhea, SHORTNESS OF BREATH, CHANGE IN BOWEL IN HABITS, constipation, abdominal pain, problems swallowing, problems with sedation, heartburn or indigestion.   Past Medical History  Diagnosis Date  . Rectal bleeding 2011    secondary to hemorrhoids  . Adenoma 11/2009    1.1 cm from TCS  . HTN (hypertension)   . GERD (gastroesophageal reflux disease)   . Kidney stones   . High cholesterol   . Obesity (BMI 30-39.9) DEC 2011 223 LBS  . DM (diabetes mellitus) ([place])    Past Surgical History  Procedure Laterality Date  . Hernia repair    . Circumcision    . Colonoscopy  Dec 2010 [facility]    MOD IH, SIMPLE ADENOMA 1.1 CM  . Colonoscopy  11/25/2012    SLF: 1. Sessile polyp measuring 63mm in size was found at the hepatic flesure; polypectomy was performed using snare cautery 2. Moderate diverticulosis was noted in the ascending colon and sigmoid colon. 3. The colon mucosa was otherwise normal 4. Large internal hemorrhoids.     Allergies  Allergen Reactions  . Dye Fdc Blue [Brilliant Blue Fcf (Fd&C Blue #1)]     PT UNSURE WHICH DYE  . Iodinated Diagnostic Agents Hives    30 YEARS AGO    Current Outpatient Prescriptions  Medication Sig Dispense Refill  . allopurinol (ZYLOPRIM) 100 MG tablet Take 100 mg by mouth 2 (two) times daily.     ROCALCITRIOL Take 0.25 mcg by mouth daily.    . clomiPHENE (CLOMID) 50 MG tablet  Take 25 mg by mouth daily. Takes 1/2 tablet every other day    . DEXILANT 60 MG capsule TAKE 1 CAPSULE EVERY DAY    . LUNESTA 2 MG TABS tablet Take 1 tablet (2 mg total) by mouth at bedtime as needed for sleep. Take immediately before bedtime    . felodipine (PLENDIL) 5 MG 24 hr tablet Take 5 mg by mouth daily.    Marland Kitchen GARLIC PO Take by mouth. When remembers    . propranolol (INDERAL) 80 MG tablet Take 80 mg by mouth 2 (two) times daily.    . psyllium (METAMUCIL) 58.6 % powder Take 1 packet by mouth daily. As needed for constipation    . Red Yeast Rice 600 MG CAPS Take 1 capsule by mouth daily.    Marland Kitchen  DIOVAN-HCT 320-12.5 MG per tablet Take 1 tablet by mouth daily.    . vitamin C  500 MG tablet Take 500 mg by mouth daily.    Review of Systems PER HPI OTHERWISE ALL SYSTEMS ARE NEGATIVE.    Objective:   Physical Exam  Constitutional: He is oriented to person, place, and time. He appears well-developed and well-nourished. No distress.  HENT:  Head: Normocephalic and atraumatic.  Mouth/Throat: Oropharynx is clear and moist. No oropharyngeal exudate.  Eyes: Pupils are equal, round, and reactive to light. No scleral icterus.  Neck: Normal  range of motion. Neck supple.  Cardiovascular: Normal rate, regular rhythm and normal heart sounds.   Pulmonary/Chest: Effort normal and breath sounds normal. No respiratory distress.  Abdominal: Soft. Bowel sounds are normal. He exhibits no distension. There is tenderness. There is no rebound.  MILD PERI-UMBILICAL TTP  Musculoskeletal: He exhibits no edema.  NO FOCAL DEFICITS  Lymphadenopathy:    He has no cervical adenopathy.  Neurological: He is alert and oriented to person, place, and time.  Psychiatric: He has a normal mood and affect.  Vitals reviewed.     Assessment & Plan:

## 2016-03-22 NOTE — Patient Instructions (Addendum)
STOP DEXILANT.  START PEPCID 40 MG DAILY WITH BREAKFAST Monday THROUGH Friday. USE DEXILANT IF NEEDED ON WEEKENDS TO CONTROL HEARTBURN.  DRINK WATER TO KEEP YOUR URINE LIGHT YELLOW.  FOLLOW A HIGH FIBER/LOW FAT DIET.   AVOID REFLUX TRIGGERS. SEE INFO BELOW.  PLEASE CALL WITH QUESTIONS OR CONCERNS.  FOLLOW UP IN 6 MOS.    Lifestyle and home remedies TO MANAGE REFLUX/CHEST PAIN  You may eliminate or reduce the frequency of heartburn by making the following lifestyle changes:  . Control your weight. Being overweight is a major risk factor for heartburn and GERD. Excess pounds put pressure on your abdomen, pushing up your stomach and causing acid to back up into your esophagus.   . Eat smaller meals. 4 TO 6 MEALS A DAY. This reduces pressure on the lower esophageal sphincter, helping to prevent the valve from opening and acid from washing back into your esophagus.   Dolphus Jenny your belt. Clothes that fit tightly around your waist put pressure on your abdomen and the lower esophageal sphincter.   . Eliminate heartburn triggers. Everyone has specific triggers. Common triggers such as fatty or fried foods, spicy food, tomato sauce, carbonated beverages, alcohol, chocolate, mint, garlic, onion, caffeine and nicotine may make heartburn worse.   Marland Kitchen Avoid stooping or bending. Tying your shoes is OK. Bending over for longer periods to weed your garden isn't, especially soon after eating.   . Don't lie down after a meal. Wait at least three to four hours after eating before going to bed, and don't lie down right after eating.   Marland Kitchen PUT THE HEAD OF YOUR BED ON 6 INCH BLOCKS.   Alternative medicine . Several home remedies exist for treating GERD, but they provide only temporary relief. They include drinking baking soda (sodium bicarbonate) added to water or drinking other fluids such as baking soda mixed with cream of tartar and water.  . Although these liquids create temporary relief by  neutralizing, washing away or buffering acids, eventually they aggravate the situation by adding gas and fluid to your stomach, increasing pressure and causing more acid reflux. Further, adding more sodium to your diet may increase your blood pressure and add stress to your heart, and excessive bicarbonate ingestion can alter the acid-base balance in your body.  Low-Fat Diet BREADS, CEREALS, PASTA, RICE, DRIED PEAS, AND BEANS These products are high in carbohydrates and most are low in fat. Therefore, they can be increased in the diet as substitutes for fatty foods. They too, however, contain calories and should not be eaten in excess. Cereals can be eaten for snacks as well as for breakfast.   FRUITS AND VEGETABLES It is good to eat fruits and vegetables. Besides being sources of fiber, both are rich in vitamins and some minerals. They help you get the daily allowances of these nutrients. Fruits and vegetables can be used for snacks and desserts.  MEATS Limit lean meat, chicken, [place], and fish to no more than 6 ounces per day. Beef, Pork, and Lamb Use lean cuts of beef, pork, and lamb. Lean cuts include:  Extra-lean ground beef.  Arm roast.  Sirloin tip.  Center-cut ham.  Round steak.  Loin chops.  Rump roast.  Tenderloin.  Trim all fat off the outside of meats before cooking. It is not necessary to severely decrease the intake of red meat, but lean choices should be made. Lean meat is rich in protein and contains a highly absorbable form of iron. Premenopausal women, in  particular, should avoid reducing lean red meat because this could increase the risk for low red blood cells (iron-deficiency anemia).  Chicken and [place] These are good sources of protein. The fat of poultry can be reduced by removing the skin and underlying fat layers before cooking. Chicken and [place] can be substituted for lean red meat in the diet. Poultry should not be fried or covered with high-fat sauces. Fish and  Shellfish Fish is a good source of protein. Shellfish contain cholesterol, but they usually are low in saturated fatty acids. The preparation of fish is important. Like chicken and [place], they should not be fried or covered with high-fat sauces. EGGS Egg whites contain no fat or cholesterol. They can be eaten often. Try 1 to 2 egg whites instead of whole eggs in recipes or use egg substitutes that do not contain yolk. MILK AND DAIRY PRODUCTS Use skim or 1% milk instead of 2% or whole milk. Decrease whole milk, natural, and processed cheeses. Use nonfat or low-fat (2%) cottage cheese or low-fat cheeses made from vegetable oils. Choose nonfat or low-fat (1 to 2%) yogurt. Experiment with evaporated skim milk in recipes that call for heavy cream. Substitute low-fat yogurt or low-fat cottage cheese for sour cream in dips and salad dressings. Have at least 2 servings of low-fat dairy products, such as 2 glasses of skim (or 1%) milk each day to help get your daily calcium intake. FATS AND OILS Reduce the total intake of fats, especially saturated fat. Butterfat, lard, and beef fats are high in saturated fat and cholesterol. These should be avoided as much as possible. Vegetable fats do not contain cholesterol, but certain vegetable fats, such as coconut oil, palm oil, and palm kernel oil are very high in saturated fats. These should be limited. These fats are often used in bakery goods, processed foods, popcorn, oils, and nondairy creamers. Vegetable shortenings and some peanut butters contain hydrogenated oils, which are also saturated fats. Read the labels on these foods and check for saturated vegetable oils. Unsaturated vegetable oils and fats do not raise blood cholesterol. However, they should be limited because they are fats and are high in calories. Total fat should still be limited to 30% of your daily caloric intake. Desirable liquid vegetable oils are corn oil, cottonseed oil, olive oil, canola oil,  safflower oil, soybean oil, and sunflower oil. Peanut oil is not as good, but small amounts are acceptable. Buy a heart-healthy tub margarine that has no partially hydrogenated oils in the ingredients. Mayonnaise and salad dressings often are made from unsaturated fats, but they should also be limited because of their high calorie and fat content. Seeds, nuts, peanut butter, olives, and avocados are high in fat, but the fat is mainly the unsaturated type. These foods should be limited mainly to avoid excess calories and fat. OTHER EATING TIPS Snacks  Most sweets should be limited as snacks. They tend to be rich in calories and fats, and their caloric content outweighs their nutritional value. Some good choices in snacks are graham crackers, melba toast, soda crackers, bagels (no egg), English muffins, fruits, and vegetables. These snacks are preferable to snack crackers, [place] fries, TORTILLA CHIPS, and POTATO chips. Popcorn should be air-popped or cooked in small amounts of liquid vegetable oil. Desserts Eat fruit, low-fat yogurt, and fruit ices instead of pastries, cake, and cookies. Sherbet, angel food cake, gelatin dessert, frozen low-fat yogurt, or other frozen products that do not contain saturated fat (pure fruit juice bars, frozen  ice pops) are also acceptable.  COOKING METHODS Choose those methods that use little or no fat. They include: Poaching.  Braising.  Steaming.  Grilling.  Baking.  Stir-frying.  Broiling.  Microwaving.  Foods can be cooked in a nonstick pan without added fat, or use a nonfat cooking spray in regular cookware. Limit fried foods and avoid frying in saturated fat. Add moisture to lean meats by using water, broth, cooking wines, and other nonfat or low-fat sauces along with the cooking methods mentioned above. Soups and stews should be chilled after cooking. The fat that forms on top after a few hours in the refrigerator should be skimmed off. When preparing meals,  avoid using excess salt. Salt can contribute to raising blood pressure in some people.  EATING AWAY FROM HOME Order entres, potatoes, and vegetables without sauces or butter. When meat exceeds the size of a deck of cards (3 to 4 ounces), the rest can be taken home for another meal. Choose vegetable or fruit salads and ask for low-calorie salad dressings to be served on the side. Use dressings sparingly. Limit high-fat toppings, such as bacon, crumbled eggs, cheese, sunflower seeds, and olives. Ask for heart-healthy tub margarine instead of butter.

## 2016-03-22 NOTE — Assessment & Plan Note (Signed)
SYMPTOMS FAIRLY WELL CONTROLLED BUT PT CONCERNED ABOUT PPIs AND KIDNEY FAILURE.  STOP DEXILANT. START PEPCID 40 MG DAILY WITH BREAKFAST Monday THROUGH Friday. USE DEXILANT IF NEEDED ON WEEKENDS TO CONTROL HEARTBURN. DRINK WATER TO KEEP YOUR URINE LIGHT YELLOW. FOLLOW A HIGH FIBER/LOW FAT DIET.  AVOID REFLUX TRIGGERS. SEE INFO BELOW. PLEASE CALL WITH QUESTIONS OR CONCERNS. FOLLOW UP IN 6 MOS.   GREATER THAN 50% WAS SPENT IN COUNSELING & COORDINATION OF CARE WITH THE PATIENT: DISCUSSED RELATIONSHIP BETWEEN PPIS/KIDNEY FAILURE, BENEFITS, RISKS, AND MANAGEMENT OF GERD IN PT WITH RENAL INSUFFICENCY. TOTAL ENCOUNTER TIME: 25 MINS.

## 2016-04-25 DIAGNOSIS — H2513 Age-related nuclear cataract, bilateral: Secondary | ICD-10-CM | POA: Diagnosis not present

## 2016-04-25 DIAGNOSIS — H524 Presbyopia: Secondary | ICD-10-CM | POA: Diagnosis not present

## 2016-04-25 DIAGNOSIS — H5213 Myopia, bilateral: Secondary | ICD-10-CM | POA: Diagnosis not present

## 2016-05-08 DIAGNOSIS — E119 Type 2 diabetes mellitus without complications: Secondary | ICD-10-CM | POA: Diagnosis not present

## 2016-05-08 DIAGNOSIS — I10 Essential (primary) hypertension: Secondary | ICD-10-CM | POA: Diagnosis not present

## 2016-05-08 DIAGNOSIS — Z0000 Encounter for general adult medical examination without abnormal findings: Secondary | ICD-10-CM | POA: Diagnosis not present

## 2016-05-12 DIAGNOSIS — Z23 Encounter for immunization: Secondary | ICD-10-CM | POA: Diagnosis not present

## 2016-05-16 DIAGNOSIS — E291 Testicular hypofunction: Secondary | ICD-10-CM | POA: Diagnosis not present

## 2016-05-16 DIAGNOSIS — N401 Enlarged prostate with lower urinary tract symptoms: Secondary | ICD-10-CM | POA: Diagnosis not present

## 2016-06-22 DIAGNOSIS — S335XXA Sprain of ligaments of lumbar spine, initial encounter: Secondary | ICD-10-CM | POA: Diagnosis not present

## 2016-06-22 DIAGNOSIS — M9901 Segmental and somatic dysfunction of cervical region: Secondary | ICD-10-CM | POA: Diagnosis not present

## 2016-06-22 DIAGNOSIS — M9902 Segmental and somatic dysfunction of thoracic region: Secondary | ICD-10-CM | POA: Diagnosis not present

## 2016-06-22 DIAGNOSIS — S134XXA Sprain of ligaments of cervical spine, initial encounter: Secondary | ICD-10-CM | POA: Diagnosis not present

## 2016-06-22 DIAGNOSIS — M546 Pain in thoracic spine: Secondary | ICD-10-CM | POA: Diagnosis not present

## 2016-06-22 DIAGNOSIS — M9903 Segmental and somatic dysfunction of lumbar region: Secondary | ICD-10-CM | POA: Diagnosis not present

## 2016-06-22 DIAGNOSIS — M4004 Postural kyphosis, thoracic region: Secondary | ICD-10-CM | POA: Diagnosis not present

## 2016-06-26 DIAGNOSIS — M9902 Segmental and somatic dysfunction of thoracic region: Secondary | ICD-10-CM | POA: Diagnosis not present

## 2016-06-26 DIAGNOSIS — S335XXA Sprain of ligaments of lumbar spine, initial encounter: Secondary | ICD-10-CM | POA: Diagnosis not present

## 2016-06-26 DIAGNOSIS — M9903 Segmental and somatic dysfunction of lumbar region: Secondary | ICD-10-CM | POA: Diagnosis not present

## 2016-06-26 DIAGNOSIS — M4004 Postural kyphosis, thoracic region: Secondary | ICD-10-CM | POA: Diagnosis not present

## 2016-06-26 DIAGNOSIS — M546 Pain in thoracic spine: Secondary | ICD-10-CM | POA: Diagnosis not present

## 2016-06-26 DIAGNOSIS — S134XXA Sprain of ligaments of cervical spine, initial encounter: Secondary | ICD-10-CM | POA: Diagnosis not present

## 2016-06-26 DIAGNOSIS — M9901 Segmental and somatic dysfunction of cervical region: Secondary | ICD-10-CM | POA: Diagnosis not present

## 2016-06-29 DIAGNOSIS — S134XXA Sprain of ligaments of cervical spine, initial encounter: Secondary | ICD-10-CM | POA: Diagnosis not present

## 2016-06-29 DIAGNOSIS — M9902 Segmental and somatic dysfunction of thoracic region: Secondary | ICD-10-CM | POA: Diagnosis not present

## 2016-06-29 DIAGNOSIS — M4004 Postural kyphosis, thoracic region: Secondary | ICD-10-CM | POA: Diagnosis not present

## 2016-06-29 DIAGNOSIS — M9903 Segmental and somatic dysfunction of lumbar region: Secondary | ICD-10-CM | POA: Diagnosis not present

## 2016-06-29 DIAGNOSIS — M9901 Segmental and somatic dysfunction of cervical region: Secondary | ICD-10-CM | POA: Diagnosis not present

## 2016-06-29 DIAGNOSIS — S335XXA Sprain of ligaments of lumbar spine, initial encounter: Secondary | ICD-10-CM | POA: Diagnosis not present

## 2016-06-29 DIAGNOSIS — M546 Pain in thoracic spine: Secondary | ICD-10-CM | POA: Diagnosis not present

## 2016-07-04 ENCOUNTER — Telehealth: Payer: Self-pay | Admitting: Internal Medicine

## 2016-07-04 DIAGNOSIS — K210 Gastro-esophageal reflux disease with esophagitis: Secondary | ICD-10-CM | POA: Diagnosis not present

## 2016-07-04 DIAGNOSIS — E291 Testicular hypofunction: Secondary | ICD-10-CM | POA: Diagnosis not present

## 2016-07-04 DIAGNOSIS — E119 Type 2 diabetes mellitus without complications: Secondary | ICD-10-CM | POA: Diagnosis not present

## 2016-07-04 DIAGNOSIS — I10 Essential (primary) hypertension: Secondary | ICD-10-CM | POA: Diagnosis not present

## 2016-07-04 NOTE — Telephone Encounter (Signed)
His last compliance download in December showed excellent compliance and control on auto PAP. The machine is set to cover full range, so there is no need or benefit from trying a different machine for higher pressure- that is not the problem.  If he is still sleepy during the day: 1) Is he getting 7-8 hours of sleep per night? 2) Do naps help? 3) Does an occasional otc caffeine tablet , like "NoDoz" help? 4)- Sometimes we use stimulant medications, but these need an office visit for discussion.

## 2016-07-04 NOTE — Telephone Encounter (Signed)
Last ov with CY on 01/10/16 Next ov with CY on 01/10/17 Instructions     Return in about 1 year (around 01/09/2017).  Patient Instructions   Script printed to try lunesta at bedtime for sleep if needed   You could also try melatonin 3-6 mg near bedtime on a regular basis, plus morning exposure to daylight, to help anchor your brain's sense of daytime and night tme   We can continue CPAP   Please call as needed     Called spoke with pt. Pt c/o falling asleep throughout the day. He states he discussed this with his PCP and he was instructed per his PCP to contact our office to discuss changing his CPAP pressures. He also is requesting an order for new supplies and mask.  I explained to him that I would need to send an order to [facility] for his recommendations. He voiced understanding and had no further questions.   CY please advise

## 2016-07-04 NOTE — Telephone Encounter (Signed)
Spoke with pt and he states: 1: he averages about 6 hours sleep nightly 2: he does take occasional naps, but nothing on a regular basis 3: he has never taken OTC caffeine tablets 4: he c/o more trouble falling asleep at night than being sleepy during the day; pt states that sometimes he does not go to sleep until very late at night and often falls asleep without CPAP on, wakes up in middle of night and then puts on  Pt does not want stimulant medication as he states that he tends to be "wired up" anyway. Pt works from home and often works late into the evening. He would like to know if there is anything else CY would recommend.  CY - Please advise. Thanks!

## 2016-07-05 NOTE — Telephone Encounter (Signed)
We had given him lunesta 2 mg last visit. Did that not help?

## 2016-07-05 NOTE — Telephone Encounter (Signed)
Pt aware of CY recommendations. Pt voiced understanding & had no further questions. Nothing further needed.

## 2016-07-05 NOTE — Telephone Encounter (Signed)
If Zyrtec worked, then benadryl might be even better. He can ask his kidney doctor if one of these would be ok to take.

## 2016-07-05 NOTE — Telephone Encounter (Signed)
Called and spoke with pt and he stated that his insurance company would not cover the [place] and he stated that they stated that they contacted our office, but never heard any response from [place].  Pt stated that he did use an OTC med---zyrtec and he stated that this put him to sleep.  He did take these for about 1 week.  Should he stay on this medication.  He stated that he is worried about taking this since he has some kidney issues.  CY please advise. Thanks  Allergies  Allergen Reactions  . Dye Fdc Blue [Brilliant Blue Fcf (Fd&C Blue #1)]     PT UNSURE WHICH DYE  . Iodinated Diagnostic Agents Hives    30 YEARS AGO

## 2016-07-06 DIAGNOSIS — M4004 Postural kyphosis, thoracic region: Secondary | ICD-10-CM | POA: Diagnosis not present

## 2016-07-06 DIAGNOSIS — M9903 Segmental and somatic dysfunction of lumbar region: Secondary | ICD-10-CM | POA: Diagnosis not present

## 2016-07-06 DIAGNOSIS — M546 Pain in thoracic spine: Secondary | ICD-10-CM | POA: Diagnosis not present

## 2016-07-06 DIAGNOSIS — M9901 Segmental and somatic dysfunction of cervical region: Secondary | ICD-10-CM | POA: Diagnosis not present

## 2016-07-06 DIAGNOSIS — M9902 Segmental and somatic dysfunction of thoracic region: Secondary | ICD-10-CM | POA: Diagnosis not present

## 2016-07-06 DIAGNOSIS — S134XXA Sprain of ligaments of cervical spine, initial encounter: Secondary | ICD-10-CM | POA: Diagnosis not present

## 2016-07-06 DIAGNOSIS — S335XXA Sprain of ligaments of lumbar spine, initial encounter: Secondary | ICD-10-CM | POA: Diagnosis not present

## 2016-07-11 DIAGNOSIS — M4004 Postural kyphosis, thoracic region: Secondary | ICD-10-CM | POA: Diagnosis not present

## 2016-07-11 DIAGNOSIS — M9903 Segmental and somatic dysfunction of lumbar region: Secondary | ICD-10-CM | POA: Diagnosis not present

## 2016-07-11 DIAGNOSIS — M546 Pain in thoracic spine: Secondary | ICD-10-CM | POA: Diagnosis not present

## 2016-07-11 DIAGNOSIS — M9901 Segmental and somatic dysfunction of cervical region: Secondary | ICD-10-CM | POA: Diagnosis not present

## 2016-07-11 DIAGNOSIS — S134XXA Sprain of ligaments of cervical spine, initial encounter: Secondary | ICD-10-CM | POA: Diagnosis not present

## 2016-07-11 DIAGNOSIS — M9902 Segmental and somatic dysfunction of thoracic region: Secondary | ICD-10-CM | POA: Diagnosis not present

## 2016-07-11 DIAGNOSIS — S335XXA Sprain of ligaments of lumbar spine, initial encounter: Secondary | ICD-10-CM | POA: Diagnosis not present

## 2016-07-25 DIAGNOSIS — M546 Pain in thoracic spine: Secondary | ICD-10-CM | POA: Diagnosis not present

## 2016-07-25 DIAGNOSIS — M9902 Segmental and somatic dysfunction of thoracic region: Secondary | ICD-10-CM | POA: Diagnosis not present

## 2016-07-25 DIAGNOSIS — M4004 Postural kyphosis, thoracic region: Secondary | ICD-10-CM | POA: Diagnosis not present

## 2016-07-25 DIAGNOSIS — S134XXA Sprain of ligaments of cervical spine, initial encounter: Secondary | ICD-10-CM | POA: Diagnosis not present

## 2016-07-25 DIAGNOSIS — S335XXA Sprain of ligaments of lumbar spine, initial encounter: Secondary | ICD-10-CM | POA: Diagnosis not present

## 2016-07-25 DIAGNOSIS — M9903 Segmental and somatic dysfunction of lumbar region: Secondary | ICD-10-CM | POA: Diagnosis not present

## 2016-07-25 DIAGNOSIS — M9901 Segmental and somatic dysfunction of cervical region: Secondary | ICD-10-CM | POA: Diagnosis not present

## 2016-08-15 DIAGNOSIS — Z23 Encounter for immunization: Secondary | ICD-10-CM | POA: Diagnosis not present

## 2016-08-18 DIAGNOSIS — N5201 Erectile dysfunction due to arterial insufficiency: Secondary | ICD-10-CM | POA: Diagnosis not present

## 2016-08-18 DIAGNOSIS — E291 Testicular hypofunction: Secondary | ICD-10-CM | POA: Diagnosis not present

## 2016-08-18 DIAGNOSIS — N401 Enlarged prostate with lower urinary tract symptoms: Secondary | ICD-10-CM | POA: Diagnosis not present

## 2016-08-22 DIAGNOSIS — E669 Obesity, unspecified: Secondary | ICD-10-CM | POA: Diagnosis not present

## 2016-08-22 DIAGNOSIS — I129 Hypertensive chronic kidney disease with stage 1 through stage 4 chronic kidney disease, or unspecified chronic kidney disease: Secondary | ICD-10-CM | POA: Diagnosis not present

## 2016-08-22 DIAGNOSIS — M1990 Unspecified osteoarthritis, unspecified site: Secondary | ICD-10-CM | POA: Diagnosis not present

## 2016-08-22 DIAGNOSIS — G4733 Obstructive sleep apnea (adult) (pediatric): Secondary | ICD-10-CM | POA: Diagnosis not present

## 2016-08-22 DIAGNOSIS — N2581 Secondary hyperparathyroidism of renal origin: Secondary | ICD-10-CM | POA: Diagnosis not present

## 2016-08-22 DIAGNOSIS — Z8739 Personal history of other diseases of the musculoskeletal system and connective tissue: Secondary | ICD-10-CM | POA: Diagnosis not present

## 2016-08-22 DIAGNOSIS — Z9989 Dependence on other enabling machines and devices: Secondary | ICD-10-CM | POA: Diagnosis not present

## 2016-08-22 DIAGNOSIS — N183 Chronic kidney disease, stage 3 (moderate): Secondary | ICD-10-CM | POA: Diagnosis not present

## 2016-08-22 DIAGNOSIS — Z87442 Personal history of urinary calculi: Secondary | ICD-10-CM | POA: Diagnosis not present

## 2016-08-22 DIAGNOSIS — E118 Type 2 diabetes mellitus with unspecified complications: Secondary | ICD-10-CM | POA: Diagnosis not present

## 2016-08-22 DIAGNOSIS — D631 Anemia in chronic kidney disease: Secondary | ICD-10-CM | POA: Diagnosis not present

## 2016-09-05 DIAGNOSIS — E119 Type 2 diabetes mellitus without complications: Secondary | ICD-10-CM | POA: Diagnosis not present

## 2016-09-05 DIAGNOSIS — I10 Essential (primary) hypertension: Secondary | ICD-10-CM | POA: Diagnosis not present

## 2016-09-05 DIAGNOSIS — E291 Testicular hypofunction: Secondary | ICD-10-CM | POA: Diagnosis not present

## 2016-09-05 DIAGNOSIS — K210 Gastro-esophageal reflux disease with esophagitis: Secondary | ICD-10-CM | POA: Diagnosis not present

## 2016-09-08 DIAGNOSIS — M9903 Segmental and somatic dysfunction of lumbar region: Secondary | ICD-10-CM | POA: Diagnosis not present

## 2016-09-08 DIAGNOSIS — S335XXA Sprain of ligaments of lumbar spine, initial encounter: Secondary | ICD-10-CM | POA: Diagnosis not present

## 2016-09-08 DIAGNOSIS — S134XXA Sprain of ligaments of cervical spine, initial encounter: Secondary | ICD-10-CM | POA: Diagnosis not present

## 2016-09-08 DIAGNOSIS — M9901 Segmental and somatic dysfunction of cervical region: Secondary | ICD-10-CM | POA: Diagnosis not present

## 2016-09-08 DIAGNOSIS — M4004 Postural kyphosis, thoracic region: Secondary | ICD-10-CM | POA: Diagnosis not present

## 2016-09-08 DIAGNOSIS — M546 Pain in thoracic spine: Secondary | ICD-10-CM | POA: Diagnosis not present

## 2016-09-08 DIAGNOSIS — M9902 Segmental and somatic dysfunction of thoracic region: Secondary | ICD-10-CM | POA: Diagnosis not present

## 2016-09-14 DIAGNOSIS — N183 Chronic kidney disease, stage 3 (moderate): Secondary | ICD-10-CM | POA: Diagnosis not present

## 2016-09-14 DIAGNOSIS — E118 Type 2 diabetes mellitus with unspecified complications: Secondary | ICD-10-CM | POA: Diagnosis not present

## 2016-11-14 DIAGNOSIS — Z9989 Dependence on other enabling machines and devices: Secondary | ICD-10-CM | POA: Diagnosis not present

## 2016-11-14 DIAGNOSIS — Z87442 Personal history of urinary calculi: Secondary | ICD-10-CM | POA: Diagnosis not present

## 2016-11-14 DIAGNOSIS — N183 Chronic kidney disease, stage 3 (moderate): Secondary | ICD-10-CM | POA: Diagnosis not present

## 2016-11-14 DIAGNOSIS — E118 Type 2 diabetes mellitus with unspecified complications: Secondary | ICD-10-CM | POA: Diagnosis not present

## 2016-11-14 DIAGNOSIS — D631 Anemia in chronic kidney disease: Secondary | ICD-10-CM | POA: Diagnosis not present

## 2016-11-14 DIAGNOSIS — N184 Chronic kidney disease, stage 4 (severe): Secondary | ICD-10-CM | POA: Diagnosis not present

## 2016-11-14 DIAGNOSIS — Z8739 Personal history of other diseases of the musculoskeletal system and connective tissue: Secondary | ICD-10-CM | POA: Diagnosis not present

## 2016-11-14 DIAGNOSIS — M1990 Unspecified osteoarthritis, unspecified site: Secondary | ICD-10-CM | POA: Diagnosis not present

## 2016-11-14 DIAGNOSIS — N2581 Secondary hyperparathyroidism of renal origin: Secondary | ICD-10-CM | POA: Diagnosis not present

## 2016-11-14 DIAGNOSIS — E669 Obesity, unspecified: Secondary | ICD-10-CM | POA: Diagnosis not present

## 2016-11-14 DIAGNOSIS — I129 Hypertensive chronic kidney disease with stage 1 through stage 4 chronic kidney disease, or unspecified chronic kidney disease: Secondary | ICD-10-CM | POA: Diagnosis not present

## 2016-11-14 DIAGNOSIS — G4733 Obstructive sleep apnea (adult) (pediatric): Secondary | ICD-10-CM | POA: Diagnosis not present

## 2016-12-12 DIAGNOSIS — E119 Type 2 diabetes mellitus without complications: Secondary | ICD-10-CM | POA: Diagnosis not present

## 2016-12-12 DIAGNOSIS — E291 Testicular hypofunction: Secondary | ICD-10-CM | POA: Diagnosis not present

## 2016-12-12 DIAGNOSIS — K210 Gastro-esophageal reflux disease with esophagitis: Secondary | ICD-10-CM | POA: Diagnosis not present

## 2016-12-12 DIAGNOSIS — I10 Essential (primary) hypertension: Secondary | ICD-10-CM | POA: Diagnosis not present

## 2017-01-09 ENCOUNTER — Ambulatory Visit: Payer: Medicare Other | Admitting: Internal Medicine

## 2017-01-10 ENCOUNTER — Encounter: Payer: Self-pay | Admitting: Internal Medicine

## 2017-01-10 ENCOUNTER — Ambulatory Visit (INDEPENDENT_AMBULATORY_CARE_PROVIDER_SITE_OTHER): Payer: Medicare Other | Admitting: Internal Medicine

## 2017-01-10 VITALS — BP 120/82 | HR 60 | Ht 69.0 in | Wt 212.0 lb

## 2017-01-10 DIAGNOSIS — J302 Other seasonal allergic rhinitis: Secondary | ICD-10-CM

## 2017-01-10 DIAGNOSIS — F5101 Primary insomnia: Secondary | ICD-10-CM | POA: Diagnosis not present

## 2017-01-10 DIAGNOSIS — J3089 Other allergic rhinitis: Secondary | ICD-10-CM | POA: Diagnosis not present

## 2017-01-10 DIAGNOSIS — G4733 Obstructive sleep apnea (adult) (pediatric): Secondary | ICD-10-CM | POA: Diagnosis not present

## 2017-01-10 MED ORDER — ZOLPIDEM TARTRATE 5 MG PO TABS: ORAL_TABLET | ORAL | 5 refills | Status: DC

## 2017-01-10 NOTE — Assessment & Plan Note (Signed)
He describes excellent compliance and control with CPAP auto 5-20. Nasal pillows mask. We are requesting download. No change needed. Quality of life definitely improved using CPAP.

## 2017-01-10 NOTE — Assessment & Plan Note (Signed)
He complains of persistent postnasal drip. Says Flonase works but he forgets to use it. Educated day and use of Flonase as a maintenance medication. Plan-use Flonase each nostril once daily at bedtime

## 2017-01-10 NOTE — Patient Instructions (Signed)
Script printed to try ambien for sleep at bedtime if needed  Ok to continue CPAP auto 5-20, mask of choice, humidifier, supplies, AirView     Dx OSA  Try using otc flonase/ fluticasone nasal spray   1-2 puffs each nostril once daily at bedtime. See if this helps the postnasal drip and stuffiness.  Please call as needed

## 2017-01-10 NOTE — Progress Notes (Signed)
HPI M never smoker followed for OSA, complicated by GERD NPSG 04/15/13- moderate obstructive sleep apnea-AHI 22.5 per hour. CPAP 10 CWP. Weight 198 pounds.   ---------------------------------------------------------------------------------  01/10/2016-66 year old male never smoker followed for OSA CPAP auto 8-20/Advanced  FOLLOWS FOR: Wears CPAP every night for about 6-7 hours; DME: [facility]; no new supplies needed at this time. Complaining of difficulty initiating sleep. Usually okay once he falls asleep-not active. Bedtime around 11 PM and he feels wide awake for an hour or 2. Easily drowsy during the day. No caffeine. He feels comfortable with CPAP and down load looks good. He does not recognize snore and feels pressure is comfortable.  01/10/2017-66 year old male never smoker followed for OSA, insomnia, complicated by GERD 1 yr f/u for OSA. Mask has been slipping off of his face a lot more.  He says the mask slippage problem was fixed by replacing with a newer mask. Prefers nasal pillows type. Uses CPAP all night every night. Pressure seems okay. Definitely sleeps better. Still has trouble initiating sleep. Insurance did not cover Johnnye Sima so he asks other options-discussed. Complains of postnasal drainage through the day. No chest tightness or cough.  ROS-see HPI Constitutional:   No-   weight loss, night sweats, fevers, chills, +fatigue, lassitude. HEENT:   No-  headaches, difficulty swallowing, tooth/dental problems, sore throat,       No-  sneezing, itching, ear ache, nasal congestion, +post nasal drip,  CV:  No-   chest pain, orthopnea, PND, swelling in lower extremities, anasarca, dizziness, palpitations Resp: No-   shortness of breath with exertion or at rest.              No-   productive cough,  No non-productive cough,  No- coughing up of blood.              No-   change in color of mucus.  No- wheezing.   Skin: No-   rash or lesions. GI:  No-   heartburn, indigestion, abdominal pain,  nausea, vomiting,  GU:  MS:  No-   joint pain or swelling.   Neuro-     nothing unusual Psych:  No- change in mood or affect. No depression or anxiety.  No memory loss.  OBJ- Physical Exam General- Alert, Oriented, Affect-appropriate, Distress- none acute,  Skin- rash-none, lesions- none, excoriation- none Lymphadenopathy- none Head- atraumatic            Eyes- Gross vision intact, PERRLA, conjunctivae and secretions clear            Ears- Hearing, canals-normal            Nose- + sniffing, + turbinate edema, no-Septal dev, mucus, polyps, erosion, perforation             Throat- Mallampati III-IV , mucosa clear , drainage- none, tonsils- atrophic Neck- flexible , trachea midline, no stridor , thyroid nl, carotid no bruit Chest - symmetrical excursion , unlabored           Heart/CV- RRR , no murmur heard , no gallop  , no rub, nl s1 s2                           - JVD- none , edema- none, stasis changes- none, varices- none           Lung- clear to P&A, wheeze- none, cough- none , dullness-none, rub- none           Chest  wall-  Abd-  Br/ Gen/ Rectal- Not done, not indicated Extrem- cyanosis- none, clubbing, none, atrophy- none, strength- nl Neuro- grossly intact to observation

## 2017-01-10 NOTE — Assessment & Plan Note (Signed)
Insurance wouldn't cover [place]. We reviewed basic sleep hygiene and discussed alternatives to medications. Plan-try Ambien 5 mg with discussion done

## 2017-01-14 ENCOUNTER — Encounter: Payer: Self-pay | Admitting: Internal Medicine

## 2017-01-30 ENCOUNTER — Encounter: Payer: Self-pay | Admitting: Gastroenterology

## 2017-02-02 DIAGNOSIS — G4733 Obstructive sleep apnea (adult) (pediatric): Secondary | ICD-10-CM | POA: Diagnosis not present

## 2017-02-02 DIAGNOSIS — Z87442 Personal history of urinary calculi: Secondary | ICD-10-CM | POA: Diagnosis not present

## 2017-02-02 DIAGNOSIS — E1122 Type 2 diabetes mellitus with diabetic chronic kidney disease: Secondary | ICD-10-CM | POA: Diagnosis not present

## 2017-02-02 DIAGNOSIS — M1990 Unspecified osteoarthritis, unspecified site: Secondary | ICD-10-CM | POA: Diagnosis not present

## 2017-02-02 DIAGNOSIS — E118 Type 2 diabetes mellitus with unspecified complications: Secondary | ICD-10-CM | POA: Diagnosis not present

## 2017-02-02 DIAGNOSIS — N184 Chronic kidney disease, stage 4 (severe): Secondary | ICD-10-CM | POA: Diagnosis not present

## 2017-02-02 DIAGNOSIS — I129 Hypertensive chronic kidney disease with stage 1 through stage 4 chronic kidney disease, or unspecified chronic kidney disease: Secondary | ICD-10-CM | POA: Diagnosis not present

## 2017-02-02 DIAGNOSIS — Z9989 Dependence on other enabling machines and devices: Secondary | ICD-10-CM | POA: Diagnosis not present

## 2017-02-02 DIAGNOSIS — D631 Anemia in chronic kidney disease: Secondary | ICD-10-CM | POA: Diagnosis not present

## 2017-02-02 DIAGNOSIS — E669 Obesity, unspecified: Secondary | ICD-10-CM | POA: Diagnosis not present

## 2017-02-02 DIAGNOSIS — N2581 Secondary hyperparathyroidism of renal origin: Secondary | ICD-10-CM | POA: Diagnosis not present

## 2017-02-02 DIAGNOSIS — Z8739 Personal history of other diseases of the musculoskeletal system and connective tissue: Secondary | ICD-10-CM | POA: Diagnosis not present

## 2017-02-07 ENCOUNTER — Telehealth: Payer: Self-pay

## 2017-02-07 ENCOUNTER — Telehealth: Payer: Self-pay | Admitting: Gastroenterology

## 2017-02-07 NOTE — Telephone Encounter (Signed)
See separate triage.  

## 2017-02-07 NOTE — Telephone Encounter (Signed)
Pt received letter that it was time for his 5 yr colonoscopy.  He has [employer]. Please call [phone]

## 2017-02-19 NOTE — Telephone Encounter (Signed)
Gastroenterology Pre-Procedure Review  Request Date: 02/07/2017 Requesting Physician: ON RECALL ( PT HAS PERSONAL HX OF ANAL CANCER)  PATIENT REVIEW QUESTIONS: The patient responded to the following health history questions as indicated:    1. Diabetes Melitis: no 2. Joint replacements in the past 12 months: no 3. Major health problems in the past 3 months: no 4. Has an artificial valve or MVP: HAS MURMUR 5. Has a defibrillator: no 6. Has been advised in past to take antibiotics in advance of a procedure like teeth cleaning: no 7. Family history of colon cancer: no  8. Alcohol Use: no  9. History of sleep apnea:yes 10. History of coronary artery or other vascular stents placed within the last 12 months: no    MEDICATIONS & ALLERGIES:    Patient reports the following regarding taking any blood thinners:   Plavix? no Aspirin? no Coumadin? no Brilinta? no Xarelto? no Eliquis? no Pradaxa? no Savaysa? no Effient? no  Patient confirms/reports the following medications:  Current Outpatient Prescriptions  Medication Sig Dispense Refill  . allopurinol (ZYLOPRIM) 100 MG tablet Take 100 mg by mouth 2 (two) times daily. Takes 3 of the 100 mg tablets once daily    . calcitRIOL (ROCALTROL) 0.25 MCG capsule Take 0.25 mcg by mouth daily.    . clomiPHENE (CLOMID) 50 MG tablet Take 25 mg by mouth daily. Takes 1/2 tablet daily    . famotidine (PEPCID) 40 MG tablet Take 1 tablet (40 mg total) by mouth daily. WITH BREAKFAST 30 tablet 11  . felodipine (PLENDIL) 5 MG 24 hr tablet Take 5 mg by mouth daily.    Marland Kitchen GARLIC PO Take by mouth. When remembers    . propranolol (INDERAL) 80 MG tablet Take 80 mg by mouth 2 (two) times daily.  1  . psyllium (METAMUCIL) 58.6 % powder Take 1 packet by mouth daily. As needed for constipation    . Red Yeast Rice 600 MG CAPS Take 1 capsule by mouth daily.    . valsartan-hydrochlorothiazide (DIOVAN-HCT) 320-12.5 MG per tablet Take 1 tablet by mouth daily.    . vitamin  C (ASCORBIC ACID) 500 MG tablet Take 500 mg by mouth daily.    Marland Kitchen zolpidem (AMBIEN) 5 MG tablet 1 at bedtime for sleep as needed 30 tablet 5  . DEXILANT 60 MG capsule TAKE 1 CAPSULE EVERY DAY (Patient not taking: Reported on 02/07/2017) 90 capsule 2   No current facility-administered medications for this visit.     Patient confirms/reports the following allergies:  Allergies  Allergen Reactions  . Dye Fdc Blue [Brilliant Blue Fcf (Fd&C Blue #1)]     PT UNSURE WHICH DYE  . Iodinated Diagnostic Agents Hives    30 YEARS AGO    No orders of the defined types were placed in this encounter.   AUTHORIZATION INFORMATION Primary Insurance:   ID #:  Group #:  Pre-Cert / Auth required:  Pre-Cert / Auth #:   Secondary Insurance:   ID #:   Group #:  Pre-Cert / Auth required: Pre-Cert / Auth #:   SCHEDULE INFORMATION: Procedure has been scheduled as follows:  Date:    03/01/2017        Time: 10:30 am   Location: [facility] Short Stay  This Gastroenterology Pre-Precedure Review Form is being routed to the following provider(s): Barney Drain, MD

## 2017-02-19 NOTE — Telephone Encounter (Signed)
MOVI PREP SPLIT DOSING, FULL LIQUIDS WITH BREAKFAST.  Full Liquid Diet A high-calorie, high-protein supplement should be used to meet your nutritional requirements when the full liquid diet is continued for more than 2 or 3 days. If this diet is to be used for an extended period of time (more than 7 days), a multivitamin should be considered.  Breads and Starches  Allowed: None are allowed   Avoid: Any others.    Potatoes/Pasta/Rice  Allowed: ANY ITEM AS A SOUP OR SMALL PLATE OF MASHED POTATOES OR SCRAMBLED EGGS. (DO NOT EAT MORE THAN ONE SERVING ON THE DAY BEFORE COLONOSCOPY).      Vegetables  Allowed: Strained tomato or vegetable juice. Vegetables pureed in soup.   Avoid: Any others.    Fruit  Allowed: Any strained fruit juices and fruit drinks. Include 1 serving of citrus or vitamin C-enriched fruit juice daily.   Avoid: Any others.  Meat and Meat Substitutes  Allowed: Egg  Avoid: Any meat, fish, or fowl. All cheese.  Milk  Allowed: SOY Milk beverages, including milk shakes and instant breakfast mixes. Smooth yogurt.   Avoid: Any others. Avoid dairy products if not tolerated.    Soups and Combination Foods  Allowed: Broth, strained cream soups. Strained, broth-based soups.   Avoid: Any others.    Desserts and Sweets  Allowed: flavored gelatin, tapioca, ice cream, sherbet, smooth pudding, junket, fruit ices, frozen ice pops, pudding pops, frozen fudge pops, chocolate syrup. Sugar, honey, jelly, syrup.   Avoid: Any others.  Fats and Oils  Allowed: Margarine, butter, cream, sour cream, oils.   Avoid: Any others.  Beverages  Allowed: All.   Avoid: None.  Condiments  Allowed: Iodized salt, pepper, spices, flavorings. Cocoa powder.   Avoid: Any others.    SAMPLE MEAL PLAN Breakfast   cup orange juice.   1 OR 2 EGGS  1 cup milk.   1 cup beverage (coffee or tea).   Cream or sugar, if desired.    Midmorning Snack  2 SCRAMBLED OR HARD  BOILED EGG   Lunch  1 cup cream soup.    cup fruit juice.   1 cup milk.    cup custard.   1 cup beverage (coffee or tea).   Cream or sugar, if desired.    Midafternoon Snack  1 cup milk shake.  Dinner  1 cup cream soup.    cup fruit juice.   1 cup MILK    cup pudding.   1 cup beverage (coffee or tea).   Cream or sugar, if desired.  Evening Snack  1 cup supplement.  To increase calories, add sugar, cream, butter, or margarine if possible. Nutritional supplements will also increase the total calories.

## 2017-02-20 ENCOUNTER — Other Ambulatory Visit: Payer: Self-pay

## 2017-02-20 DIAGNOSIS — S134XXA Sprain of ligaments of cervical spine, initial encounter: Secondary | ICD-10-CM | POA: Diagnosis not present

## 2017-02-20 DIAGNOSIS — M546 Pain in thoracic spine: Secondary | ICD-10-CM | POA: Diagnosis not present

## 2017-02-20 DIAGNOSIS — M4004 Postural kyphosis, thoracic region: Secondary | ICD-10-CM | POA: Diagnosis not present

## 2017-02-20 DIAGNOSIS — Z1211 Encounter for screening for malignant neoplasm of colon: Secondary | ICD-10-CM

## 2017-02-20 DIAGNOSIS — S335XXA Sprain of ligaments of lumbar spine, initial encounter: Secondary | ICD-10-CM | POA: Diagnosis not present

## 2017-02-20 DIAGNOSIS — M9901 Segmental and somatic dysfunction of cervical region: Secondary | ICD-10-CM | POA: Diagnosis not present

## 2017-02-20 DIAGNOSIS — M9903 Segmental and somatic dysfunction of lumbar region: Secondary | ICD-10-CM | POA: Diagnosis not present

## 2017-02-20 DIAGNOSIS — M9902 Segmental and somatic dysfunction of thoracic region: Secondary | ICD-10-CM | POA: Diagnosis not present

## 2017-02-20 MED ORDER — PEG-KCL-NACL-NASULF-NA ASC-C 100 G PO SOLR: 1.0000 | ORAL | 0 refills | Status: DC

## 2017-02-20 NOTE — Telephone Encounter (Signed)
Rx sent to the pharmacy and instructions mailed to pt.  

## 2017-02-21 DIAGNOSIS — M9901 Segmental and somatic dysfunction of cervical region: Secondary | ICD-10-CM | POA: Diagnosis not present

## 2017-02-21 DIAGNOSIS — M546 Pain in thoracic spine: Secondary | ICD-10-CM | POA: Diagnosis not present

## 2017-02-21 DIAGNOSIS — S134XXA Sprain of ligaments of cervical spine, initial encounter: Secondary | ICD-10-CM | POA: Diagnosis not present

## 2017-02-21 DIAGNOSIS — S335XXA Sprain of ligaments of lumbar spine, initial encounter: Secondary | ICD-10-CM | POA: Diagnosis not present

## 2017-02-21 DIAGNOSIS — M9903 Segmental and somatic dysfunction of lumbar region: Secondary | ICD-10-CM | POA: Diagnosis not present

## 2017-02-21 DIAGNOSIS — M9902 Segmental and somatic dysfunction of thoracic region: Secondary | ICD-10-CM | POA: Diagnosis not present

## 2017-02-21 DIAGNOSIS — M4004 Postural kyphosis, thoracic region: Secondary | ICD-10-CM | POA: Diagnosis not present

## 2017-02-22 DIAGNOSIS — S134XXA Sprain of ligaments of cervical spine, initial encounter: Secondary | ICD-10-CM | POA: Diagnosis not present

## 2017-02-22 DIAGNOSIS — M9901 Segmental and somatic dysfunction of cervical region: Secondary | ICD-10-CM | POA: Diagnosis not present

## 2017-02-22 DIAGNOSIS — M9902 Segmental and somatic dysfunction of thoracic region: Secondary | ICD-10-CM | POA: Diagnosis not present

## 2017-02-22 DIAGNOSIS — M546 Pain in thoracic spine: Secondary | ICD-10-CM | POA: Diagnosis not present

## 2017-02-22 DIAGNOSIS — M9903 Segmental and somatic dysfunction of lumbar region: Secondary | ICD-10-CM | POA: Diagnosis not present

## 2017-02-22 DIAGNOSIS — S335XXA Sprain of ligaments of lumbar spine, initial encounter: Secondary | ICD-10-CM | POA: Diagnosis not present

## 2017-02-22 DIAGNOSIS — M4004 Postural kyphosis, thoracic region: Secondary | ICD-10-CM | POA: Diagnosis not present

## 2017-02-23 DIAGNOSIS — I129 Hypertensive chronic kidney disease with stage 1 through stage 4 chronic kidney disease, or unspecified chronic kidney disease: Secondary | ICD-10-CM | POA: Diagnosis not present

## 2017-02-26 NOTE — Telephone Encounter (Signed)
NO PA is needed 

## 2017-02-27 DIAGNOSIS — M9901 Segmental and somatic dysfunction of cervical region: Secondary | ICD-10-CM | POA: Diagnosis not present

## 2017-02-27 DIAGNOSIS — M9902 Segmental and somatic dysfunction of thoracic region: Secondary | ICD-10-CM | POA: Diagnosis not present

## 2017-02-27 DIAGNOSIS — M546 Pain in thoracic spine: Secondary | ICD-10-CM | POA: Diagnosis not present

## 2017-02-27 DIAGNOSIS — S335XXA Sprain of ligaments of lumbar spine, initial encounter: Secondary | ICD-10-CM | POA: Diagnosis not present

## 2017-02-27 DIAGNOSIS — S134XXA Sprain of ligaments of cervical spine, initial encounter: Secondary | ICD-10-CM | POA: Diagnosis not present

## 2017-02-27 DIAGNOSIS — M4004 Postural kyphosis, thoracic region: Secondary | ICD-10-CM | POA: Diagnosis not present

## 2017-02-27 DIAGNOSIS — M9903 Segmental and somatic dysfunction of lumbar region: Secondary | ICD-10-CM | POA: Diagnosis not present

## 2017-03-01 ENCOUNTER — Encounter (HOSPITAL_COMMUNITY): Payer: Self-pay | Admitting: *Deleted

## 2017-03-01 ENCOUNTER — Encounter (HOSPITAL_COMMUNITY)
Admission: RE | Disposition: A | Discharge status: Home / Self Care | Payer: Self-pay | Source: Ambulatory Visit | Attending: Gastroenterology

## 2017-03-01 ENCOUNTER — Ambulatory Visit (HOSPITAL_COMMUNITY)
Admission: RE | Admit: 2017-03-01 | Discharge: 2017-03-01 | Disposition: A | Discharge status: Home / Self Care | Payer: Medicare Other | Source: Ambulatory Visit | Attending: Gastroenterology | Admitting: Gastroenterology

## 2017-03-01 DIAGNOSIS — K644 Residual hemorrhoidal skin tags: Secondary | ICD-10-CM | POA: Diagnosis not present

## 2017-03-01 DIAGNOSIS — Z79899 Other long term (current) drug therapy: Secondary | ICD-10-CM | POA: Insufficient documentation

## 2017-03-01 DIAGNOSIS — E119 Type 2 diabetes mellitus without complications: Secondary | ICD-10-CM | POA: Insufficient documentation

## 2017-03-01 DIAGNOSIS — Z1211 Encounter for screening for malignant neoplasm of colon: Secondary | ICD-10-CM | POA: Insufficient documentation

## 2017-03-01 DIAGNOSIS — D122 Benign neoplasm of ascending colon: Secondary | ICD-10-CM | POA: Diagnosis not present

## 2017-03-01 DIAGNOSIS — Z86010 Personal history of colonic polyps: Secondary | ICD-10-CM | POA: Diagnosis not present

## 2017-03-01 DIAGNOSIS — Z91041 Radiographic dye allergy status: Secondary | ICD-10-CM | POA: Diagnosis not present

## 2017-03-01 DIAGNOSIS — Z6829 Body mass index (BMI) 29.0-29.9, adult: Secondary | ICD-10-CM | POA: Diagnosis not present

## 2017-03-01 DIAGNOSIS — E669 Obesity, unspecified: Secondary | ICD-10-CM | POA: Diagnosis not present

## 2017-03-01 DIAGNOSIS — Z801 Family history of malignant neoplasm of trachea, bronchus and lung: Secondary | ICD-10-CM | POA: Insufficient documentation

## 2017-03-01 DIAGNOSIS — Z87442 Personal history of urinary calculi: Secondary | ICD-10-CM | POA: Diagnosis not present

## 2017-03-01 DIAGNOSIS — K5730 Diverticulosis of large intestine without perforation or abscess without bleeding: Secondary | ICD-10-CM | POA: Insufficient documentation

## 2017-03-01 DIAGNOSIS — I10 Essential (primary) hypertension: Secondary | ICD-10-CM | POA: Insufficient documentation

## 2017-03-01 DIAGNOSIS — K219 Gastro-esophageal reflux disease without esophagitis: Secondary | ICD-10-CM | POA: Diagnosis not present

## 2017-03-01 DIAGNOSIS — D123 Benign neoplasm of transverse colon: Secondary | ICD-10-CM

## 2017-03-01 DIAGNOSIS — K648 Other hemorrhoids: Secondary | ICD-10-CM | POA: Insufficient documentation

## 2017-03-01 DIAGNOSIS — Z91048 Other nonmedicinal substance allergy status: Secondary | ICD-10-CM | POA: Insufficient documentation

## 2017-03-01 DIAGNOSIS — D126 Benign neoplasm of colon, unspecified: Secondary | ICD-10-CM

## 2017-03-01 DIAGNOSIS — E7800 Pure hypercholesterolemia, unspecified: Secondary | ICD-10-CM | POA: Insufficient documentation

## 2017-03-01 HISTORY — PX: COLONOSCOPY: SHX5424

## 2017-03-01 LAB — GLUCOSE, CAPILLARY: Glucose-Capillary: 117 mg/dL — ABNORMAL HIGH (ref 65–99)

## 2017-03-01 SURGERY — COLONOSCOPY
Anesthesia: Moderate Sedation

## 2017-03-01 MED ORDER — MIDAZOLAM HCL 5 MG/5ML IJ SOLN
INTRAMUSCULAR | Status: DC | PRN
  Administered 2017-03-01 (×2): 2 mg via INTRAVENOUS

## 2017-03-01 MED ORDER — MIDAZOLAM HCL 5 MG/5ML IJ SOLN
INTRAMUSCULAR | Status: AC
  Filled 2017-03-01: qty 10

## 2017-03-01 MED ORDER — MEPERIDINE HCL 100 MG/ML IJ SOLN
INTRAMUSCULAR | Status: DC | PRN
  Administered 2017-03-01: 25 mg via INTRAVENOUS
  Administered 2017-03-01: 50 mg

## 2017-03-01 MED ORDER — ONDANSETRON HCL 4 MG/2ML IJ SOLN
INTRAMUSCULAR | Status: AC
  Filled 2017-03-01: qty 2

## 2017-03-01 MED ORDER — MEPERIDINE HCL 100 MG/ML IJ SOLN
INTRAMUSCULAR | Status: AC
  Filled 2017-03-01: qty 2

## 2017-03-01 MED ORDER — SODIUM CHLORIDE 0.9 % IV SOLN: INTRAVENOUS | Status: DC

## 2017-03-01 MED ORDER — ONDANSETRON HCL 4 MG/2ML IJ SOLN
INTRAMUSCULAR | Status: DC | PRN
  Administered 2017-03-01: 4 mg via INTRAVENOUS

## 2017-03-01 NOTE — Op Note (Signed)
[facility] Patient Name: Bruce Weber Procedure Date: 03/01/2017 10:31 AM MRN: [id number] Date of Birth: Apr 10, 1951 Attending MD: Barney Drain , MD CSN: [id number] Age: 66 Admit Type: Outpatient Procedure:                Colonoscopy with COLD SNARE POLYPECTOMY Indications:              High risk colon cancer surveillance: Personal                            history of colonic polyps Providers:                Barney Drain, MD, Rosina Lowenstein, RN, Charlyne Petrin RN, RN Referring MD:             Don Broach. Clark Medicines:                Ondansetron 4 mg IV, Meperidine 75 mg IV, Midazolam                            4 mg IV Complications:            No immediate complications. Estimated Blood Loss:     Estimated blood loss was minimal. Procedure:                Pre-Anesthesia Assessment:                           - Prior to the procedure, a History and Physical                            was performed, and patient medications and                            allergies were reviewed. The patient's tolerance of                            previous anesthesia was also reviewed. The risks                            and benefits of the procedure and the sedation                            options and risks were discussed with the patient.                            All questions were answered, and informed consent                            was obtained. Prior Anticoagulants: The patient has                            taken no previous anticoagulant or antiplatelet  agents. ASA Grade Assessment: II - A patient with                            mild systemic disease. After reviewing the risks                            and benefits, the patient was deemed in                            satisfactory condition to undergo the procedure.                            After obtaining informed consent, the colonoscope                            was  passed under direct vision. Throughout the                            procedure, the patient's blood pressure, pulse, and                            oxygen saturations were monitored continuously. The                            EC-[id number] ([id number]) scope was introduced through                            the anus and advanced to the the cecum, identified                            by appendiceal orifice and ileocecal valve. The                            colonoscopy was somewhat difficult due to a                            tortuous colon. Successful completion of the                            procedure was aided by COLOWRAP. The patient                            tolerated the procedure well. The quality of the                            bowel preparation was excellent. The ileocecal                            valve, appendiceal orifice, and rectum were                            photographed. Scope In: 10:56:49 AM Scope Out: 34:74:25 AM Scope Withdrawal Time: 0 hours 16 minutes 38 seconds  Total Procedure Duration: 0  hours 19 minutes 25 seconds  Findings:      Three sessile polyps were found in the splenic flexure, proximal       transverse colon and ascending colon. The polyps were 3 to 5 mm in size.       These polyps were removed with a cold snare. Resection and retrieval       were complete.      Multiple small and large-mouthed diverticula were found in the sigmoid       colon, descending colon, transverse colon and ascending colon.      External and internal hemorrhoids were found during retroflexion. The       hemorrhoids were moderate. Impression:               - Three 3 to 5 mm polyps at the splenic flexure, in                            the proximal transverse colon and in the ascending                            colon, removed with a cold snare. Resected and                            retrieved.                           - Diverticulosis in the sigmoid colon, in the                             descending colon, in the transverse colon and in                            the ascending colon.                           - External and internal hemorrhoids. Moderate Sedation:      Moderate (conscious) sedation was administered by the endoscopy nurse       and supervised by the endoscopist. The following parameters were       monitored: oxygen saturation, heart rate, blood pressure, and response       to care. Total physician intraservice time was 30 minutes. Recommendation:           - Repeat colonoscopy in 3 - 5 years for                            surveillance.                           - Low fiber diet.                           - Continue present medications.                           - Await pathology results.                           -  Patient has a contact number available for                            emergencies. The signs and symptoms of potential                            delayed complications were discussed with the                            patient. Return to normal activities tomorrow.                            Written discharge instructions were provided to the                            patient. Procedure Code(s):        --- Professional ---                           [phone], Colonoscopy, flexible; with removal of                            tumor(s), polyp(s), or other lesion(s) by snare                            technique                           99152, Moderate sedation services provided by the                            same physician or other qualified health care                            professional performing the diagnostic or                            therapeutic service that the sedation supports,                            requiring the presence of an independent trained                            observer to assist in the monitoring of the                            patient's level of consciousness and physiological                             status; initial 15 minutes of intraservice time,                            patient age 47 years or older  [address]17, Moderate sedation services; each additional                            15 minutes intraservice time Diagnosis Code(s):        --- Professional ---                           Z86.010, Personal history of colonic polyps                           D12.3, Benign neoplasm of transverse colon (hepatic                            flexure or splenic flexure)                           D12.2, Benign neoplasm of ascending colon                           K64.8, Other hemorrhoids                           K57.30, Diverticulosis of large intestine without                            perforation or abscess without bleeding CPT copyright 2016 American Medical Association. All rights reserved. The codes documented in this report are preliminary and upon coder review may  be revised to meet current compliance requirements. Barney Drain, MD Barney Drain, MD 03/01/2017 11:33:19 AM This report has been signed electronically. Number of Addenda: 0

## 2017-03-01 NOTE — H&P (Signed)
Primary Care Physician:  [name], MD Primary Gastroenterologist:  Dr. Oneida Alar  Pre-Procedure History & Physical: HPI:  Bruce Weber is a 66 y.o. male here for  PERSONAL HISTORY OF POLYPS-LAST TCS DEC 2013 ONE SIMPLE ADENOMA.  Past Medical History:  Diagnosis Date  . Adenoma 11/2009   1.1 cm from TCS  . DM (diabetes mellitus) ([place])   . GERD (gastroesophageal reflux disease)   . High cholesterol   . HTN (hypertension)   . Kidney stones   . Obesity (BMI 30-39.9) DEC 2011 223 LBS  . Rectal bleeding 2011   secondary to hemorrhoids    Past Surgical History:  Procedure Laterality Date  . CIRCUMCISION    . COLONOSCOPY  Dec 2010 [facility]   MOD IH, SIMPLE ADENOMA 1.1 CM  . COLONOSCOPY  11/25/2012   SLF: 1. Sessile polyp measuring 75m in size was found at the hepatic flesure; polypectomy was performed using snare cautery 2. Moderate diverticulosis was noted in the ascending colon and sigmoid colon. 3. The colon mucosa was otherwise normal 4. Large internal hemorrhoids.   .Marland KitchenHERNIA REPAIR      Prior to Admission medications   Medication Sig Start Date End Date Taking? Authorizing Provider  acetaminophen (TYLENOL) 500 MG tablet Take 1,000 mg by mouth 2 (two) times daily as needed for headache.   Yes Historical Provider, MD  allopurinol (ZYLOPRIM) 100 MG tablet Take 300 mg by mouth daily.    Yes Historical Provider, MD  calcitRIOL (ROCALTROL) 0.25 MCG capsule Take 0.25 mcg by mouth at bedtime.    Yes Historical Provider, MD  clomiPHENE (CLOMID) 50 MG tablet Take 25 mg by mouth at bedtime.    Yes Historical Provider, MD  DEXILANT 60 MG capsule TAKE 1 CAPSULE EVERY DAY Patient taking differently: TAKE 1 CAPSULE DAILY ON SATURDAY AND SUNDAYS ONLY 03/06/16  Yes AAnnitta Needs NP  famotidine (PEPCID) 40 MG tablet Take 1 tablet (40 mg total) by mouth daily. WITH BREAKFAST Patient taking differently: Take 40 mg by mouth daily. Mon- Fri 03/22/16  Yes SDanie Binder MD  felodipine (PLENDIL) 5 MG  24 hr tablet Take 5 mg by mouth at bedtime.    Yes Historical Provider, MD  fluticasone (FLONASE) 50 MCG/ACT nasal spray Place 1 spray into both nostrils daily as needed for allergies or rhinitis.   Yes Historical Provider, MD  peg 3350 powder (MOVIPREP) 100 g SOLR Take 1 kit (200 g total) by mouth as directed. 02/20/17  Yes SDanie Binder MD  propranolol (INDERAL) 80 MG tablet Take 80 mg by mouth 2 (two) times daily. 11/17/15  Yes Historical Provider, MD  psyllium (METAMUCIL) 58.6 % powder Take 1 packet by mouth daily. As needed for constipation   Yes Historical Provider, MD  valsartan-hydrochlorothiazide (DIOVAN-HCT) 320-12.5 MG per tablet Take 1 tablet by mouth at bedtime.    Yes Historical Provider, MD  vitamin C (ASCORBIC ACID) 500 MG tablet Take 500 mg by mouth 2 (two) times a week.    Yes Historical Provider, MD  zolpidem (AMBIEN) 5 MG tablet 1 at bedtime for sleep as needed Patient taking differently: Take 5 mg by mouth at bedtime as needed for sleep.  01/10/17  Yes CDeneise Lever MD  famciclovir (FAMVIR) 500 MG tablet Take 1,000 mg by mouth 2 (two) times daily as needed (fever blisters).  02/19/17   Historical Provider, MD    Allergies as of 02/20/2017 - Review Complete 02/07/2017  Allergen Reaction Noted  . Dye  fdc blue [brilliant blue fcf (fd&c blue #1)]  06/12/2011  . Iodinated diagnostic agents Hives 06/12/2011    Family History  Problem Relation Age of Onset  . Lung cancer Father   . Colon cancer Neg Hx   . Colon polyps Neg Hx     Social History   Social History  . Marital status: Married    Spouse name: N/A  . Number of children: N/A  . Years of education: N/A   Occupational History  . Contractor for [employer]    Social History Main Topics  . Smoking status: Never Smoker  . Smokeless tobacco: Never Used     Comment: Never smoked  . Alcohol use No  . Drug use: No  . Sexual activity: No   Other Topics Concern  . Not on file   Social History Narrative   NO KIDS.  MARRIED TO 2ND WIFE.    Review of Systems: See HPI, otherwise negative ROS   Physical Exam: BP (!) 157/87   Pulse 64   Temp 98.6 F (37 C) (Oral)   Resp 16   Ht _0  (1.778 m)   Wt 204 lb (92.5 kg)   SpO2 100%   BMI 29.27 kg/m  General:   Alert,  pleasant and cooperative in NAD Head:  Normocephalic and atraumatic. Neck:  Supple; Lungs:  Clear throughout to auscultation.    Heart:  Regular rate and rhythm. Abdomen:  Soft, nontender and nondistended. Normal bowel sounds, without guarding, and without rebound.   Neurologic:  Alert and  oriented x4;  grossly normal neurologically.  Impression/Plan:     PERSONAL HISTORY OF POLYPS.  PLAN: 1. TCS TODAY. DISCUSSED PROCEDURE, BENEFITS, & RISKS: < 1% chance of medication reaction, bleeding, perforation, or rupture of spleen/liver.

## 2017-03-01 NOTE — Discharge Instructions (Signed)
You have [place] lnternal hemorrhoids, WHICH CAN CAUSE RECTAL BLEEDING. YOU HAVE diverticulosis IN YOUR RIGHT AND LEFT COLON. YOU HAD THREE SMALL POLYPS REMOVED.    CONTINUE YOUR WEIGHT LOSS EFFORTS. LOSE TWENTY POUNDS.  FOLLOW A PLANT BASED DIET. THE DIET IS BASED ON THE BOOK, "PREVENT AND REVERSE HEART DISEASE", CALDWELL ESSELSTYN, MD.  DRINK WATER TO KEEP YOUR URINE LIGHT YELLOW.    USE PREPARATION H FOUR TIMES  A DAY IF NEEDED TO RELIEVE RECTAL PAIN/PRESSURE/BLEEDING. RESTRICT USE OF HYDROCORTISONE SUPPOSITORIES TO 3-4 TIMES A YEAR. IF CREAMS AND SUPPOSITORIES CANNOT CONTROL YOUR SYMPTOMS, YOU SHOULD CONSIDER SURGERY TO FIX YOUR HEMORRHOIDS.  YOUR BIOPSY RESULTS WILL BE AVAILABLE IN MY CHART  APR 3 AND MY OFFICE WILL CONTACT YOU IN 10-14 DAYS WITH YOUR RESULTS.   Next colonoscopy in 3-5 years.  Colonoscopy Care After Read the instructions outlined below and refer to this sheet in the next week. These discharge instructions provide you with general information on caring for yourself after you leave the hospital. While your treatment has been planned according to the most current medical practices available, unavoidable complications occasionally occur. If you have any problems or questions after discharge, call DR. Aava Deland, [phone].  ACTIVITY  You may resume your regular activity, but move at a slower pace for the next 24 hours.   Take frequent rest periods for the next 24 hours.   Walking will help get rid of the air and reduce the bloated feeling in your belly (abdomen).   No driving for 24 hours (because of the medicine (anesthesia) used during the test).   You may shower.   Do not sign any important legal documents or operate any machinery for 24 hours (because of the anesthesia used during the test).    NUTRITION  Drink plenty of fluids.   You may resume your normal diet as instructed by your doctor.   Begin with a light meal and progress to your normal diet.  Heavy or fried foods are harder to digest and may make you feel sick to your stomach (nauseated).   Avoid alcoholic beverages for 24 hours or as instructed.    MEDICATIONS  You may resume your normal medications.   WHAT YOU CAN EXPECT TODAY  Some feelings of bloating in the abdomen.   Passage of more gas than usual.   Spotting of blood in your stool or on the toilet paper  .  IF YOU HAD POLYPS REMOVED DURING THE COLONOSCOPY:  Eat a soft diet IF YOU HAVE NAUSEA, BLOATING, ABDOMINAL PAIN, OR VOMITING.    FINDING OUT THE RESULTS OF YOUR TEST Not all test results are available during your visit. DR. Oneida Alar WILL CALL YOU WITHIN 14 DAYS OF YOUR PROCEDUE WITH YOUR RESULTS. Do not assume everything is normal if you have not heard from DR. Diem Dicocco, CALL HER OFFICE AT [phone].  SEEK IMMEDIATE MEDICAL ATTENTION AND CALL THE OFFICE: [phone] IF:  You have more than a spotting of blood in your stool.   Your belly is swollen (abdominal distention).   You are nauseated or vomiting.   You have a temperature over 101F.   You have abdominal pain or discomfort that is severe or gets worse throughout the day.  Polyps, Colon  A polyp is extra tissue that grows inside your body. Colon polyps grow in the large intestine. The large intestine, also called the colon, is part of your digestive system. It is a long, hollow tube at the end of your  digestive tract where your body makes and stores stool. Most polyps are not dangerous. They are benign. This means they are not cancerous. But over time, some types of polyps can turn into cancer. Polyps that are smaller than a pea are usually not harmful. But larger polyps could someday become or may already be cancerous. To be safe, doctors remove all polyps and test them.   PREVENTION There is not one sure way to prevent polyps. You might be able to lower your risk of getting them if you:  Eat more fruits and vegetables and less fatty food.    Do not smoke.   Avoid alcohol.   Exercise every day.   Lose weight if you are overweight.   Eating more calcium and folate can also lower your risk of getting polyps. Some foods that are rich in calcium are milk, cheese, and broccoli. Some foods that are rich in folate are chickpeas, kidney beans, and spinach.    Diverticulosis Diverticulosis is a common condition that develops when small pouches (diverticula) form in the wall of the colon. The risk of diverticulosis increases with age. It happens more often in people who eat a low-fiber diet. Most individuals with diverticulosis have no symptoms. Those individuals with symptoms usually experience belly (abdominal) pain, constipation, or loose stools (diarrhea).  HOME CARE INSTRUCTIONS  Increase the amount of fiber in your diet as directed by your caregiver or dietician. This may reduce symptoms of diverticulosis.   Drink at least 6 to 8 glasses of water each day to prevent constipation.   Try not to strain when you have a bowel movement.   Avoiding nuts and seeds to prevent complications is NOT NECESSARY.     FOODS HAVING HIGH FIBER CONTENT INCLUDE:  Fruits. Apple, peach, pear, tangerine, raisins, prunes.   Vegetables. Brussels sprouts, asparagus, broccoli, cabbage, carrot, cauliflower, romaine lettuce, spinach, summer squash, tomato, winter squash, zucchini.   Starchy Vegetables. Baked beans, kidney beans, lima beans, split peas, lentils, potatoes (with skin).   Grains. Whole wheat bread, brown rice, bran flake cereal, plain oatmeal, white rice, shredded wheat, bran muffins.    SEEK IMMEDIATE MEDICAL CARE IF:  You develop increasing pain or severe bloating.   You have an oral temperature above 101F.   You develop vomiting or bowel movements that are bloody or black.   Hemorrhoids Hemorrhoids are dilated (enlarged) veins around the rectum. Sometimes clots will form in the veins. This makes them swollen and painful.  These are called thrombosed hemorrhoids. Causes of hemorrhoids include:  Constipation.   Straining to have a bowel movement.   HEAVY LIFTING  HOME CARE INSTRUCTIONS  Eat a well balanced diet and drink 6 to 8 glasses of water every day to avoid constipation. You may also use a bulk laxative.   Avoid straining to have bowel movements.   Keep anal area dry and clean.   Do not use a donut shaped pillow or sit on the toilet for long periods. This increases blood pooling and pain.   Move your bowels when your body has the urge; this will require less straining and will decrease pain and pressure.

## 2017-03-05 NOTE — Progress Notes (Signed)
ON RECALL  °

## 2017-03-06 DIAGNOSIS — M9901 Segmental and somatic dysfunction of cervical region: Secondary | ICD-10-CM | POA: Diagnosis not present

## 2017-03-06 DIAGNOSIS — M9903 Segmental and somatic dysfunction of lumbar region: Secondary | ICD-10-CM | POA: Diagnosis not present

## 2017-03-06 DIAGNOSIS — S335XXA Sprain of ligaments of lumbar spine, initial encounter: Secondary | ICD-10-CM | POA: Diagnosis not present

## 2017-03-06 DIAGNOSIS — S134XXA Sprain of ligaments of cervical spine, initial encounter: Secondary | ICD-10-CM | POA: Diagnosis not present

## 2017-03-06 DIAGNOSIS — M4004 Postural kyphosis, thoracic region: Secondary | ICD-10-CM | POA: Diagnosis not present

## 2017-03-06 DIAGNOSIS — M9902 Segmental and somatic dysfunction of thoracic region: Secondary | ICD-10-CM | POA: Diagnosis not present

## 2017-03-06 DIAGNOSIS — M546 Pain in thoracic spine: Secondary | ICD-10-CM | POA: Diagnosis not present

## 2017-03-08 ENCOUNTER — Encounter (HOSPITAL_COMMUNITY): Payer: Self-pay | Admitting: Gastroenterology

## 2017-03-08 ENCOUNTER — Other Ambulatory Visit: Payer: Self-pay | Admitting: Gastroenterology

## 2017-03-15 ENCOUNTER — Other Ambulatory Visit: Payer: Self-pay

## 2017-03-16 MED ORDER — FAMOTIDINE 40 MG PO TABS: 40.0000 mg | ORAL_TABLET | Freq: Every day | ORAL | 11 refills | Status: DC

## 2017-03-29 DIAGNOSIS — M9901 Segmental and somatic dysfunction of cervical region: Secondary | ICD-10-CM | POA: Diagnosis not present

## 2017-03-29 DIAGNOSIS — M9902 Segmental and somatic dysfunction of thoracic region: Secondary | ICD-10-CM | POA: Diagnosis not present

## 2017-03-29 DIAGNOSIS — S335XXA Sprain of ligaments of lumbar spine, initial encounter: Secondary | ICD-10-CM | POA: Diagnosis not present

## 2017-03-29 DIAGNOSIS — M4004 Postural kyphosis, thoracic region: Secondary | ICD-10-CM | POA: Diagnosis not present

## 2017-03-29 DIAGNOSIS — M546 Pain in thoracic spine: Secondary | ICD-10-CM | POA: Diagnosis not present

## 2017-03-29 DIAGNOSIS — M9903 Segmental and somatic dysfunction of lumbar region: Secondary | ICD-10-CM | POA: Diagnosis not present

## 2017-03-29 DIAGNOSIS — S134XXA Sprain of ligaments of cervical spine, initial encounter: Secondary | ICD-10-CM | POA: Diagnosis not present

## 2017-04-10 DIAGNOSIS — K210 Gastro-esophageal reflux disease with esophagitis: Secondary | ICD-10-CM | POA: Diagnosis not present

## 2017-04-10 DIAGNOSIS — E291 Testicular hypofunction: Secondary | ICD-10-CM | POA: Diagnosis not present

## 2017-04-10 DIAGNOSIS — I10 Essential (primary) hypertension: Secondary | ICD-10-CM | POA: Diagnosis not present

## 2017-04-10 DIAGNOSIS — E119 Type 2 diabetes mellitus without complications: Secondary | ICD-10-CM | POA: Diagnosis not present

## 2017-04-12 DIAGNOSIS — S134XXA Sprain of ligaments of cervical spine, initial encounter: Secondary | ICD-10-CM | POA: Diagnosis not present

## 2017-04-12 DIAGNOSIS — M9901 Segmental and somatic dysfunction of cervical region: Secondary | ICD-10-CM | POA: Diagnosis not present

## 2017-04-12 DIAGNOSIS — M9903 Segmental and somatic dysfunction of lumbar region: Secondary | ICD-10-CM | POA: Diagnosis not present

## 2017-04-12 DIAGNOSIS — M9902 Segmental and somatic dysfunction of thoracic region: Secondary | ICD-10-CM | POA: Diagnosis not present

## 2017-04-12 DIAGNOSIS — M4004 Postural kyphosis, thoracic region: Secondary | ICD-10-CM | POA: Diagnosis not present

## 2017-04-12 DIAGNOSIS — S335XXA Sprain of ligaments of lumbar spine, initial encounter: Secondary | ICD-10-CM | POA: Diagnosis not present

## 2017-04-12 DIAGNOSIS — M546 Pain in thoracic spine: Secondary | ICD-10-CM | POA: Diagnosis not present

## 2017-04-24 DIAGNOSIS — H2513 Age-related nuclear cataract, bilateral: Secondary | ICD-10-CM | POA: Diagnosis not present

## 2017-04-24 DIAGNOSIS — H25013 Cortical age-related cataract, bilateral: Secondary | ICD-10-CM | POA: Diagnosis not present

## 2017-05-03 DIAGNOSIS — S335XXA Sprain of ligaments of lumbar spine, initial encounter: Secondary | ICD-10-CM | POA: Diagnosis not present

## 2017-05-03 DIAGNOSIS — M9902 Segmental and somatic dysfunction of thoracic region: Secondary | ICD-10-CM | POA: Diagnosis not present

## 2017-05-03 DIAGNOSIS — S134XXA Sprain of ligaments of cervical spine, initial encounter: Secondary | ICD-10-CM | POA: Diagnosis not present

## 2017-05-03 DIAGNOSIS — M9901 Segmental and somatic dysfunction of cervical region: Secondary | ICD-10-CM | POA: Diagnosis not present

## 2017-05-03 DIAGNOSIS — M546 Pain in thoracic spine: Secondary | ICD-10-CM | POA: Diagnosis not present

## 2017-05-03 DIAGNOSIS — M4004 Postural kyphosis, thoracic region: Secondary | ICD-10-CM | POA: Diagnosis not present

## 2017-05-03 DIAGNOSIS — M9903 Segmental and somatic dysfunction of lumbar region: Secondary | ICD-10-CM | POA: Diagnosis not present

## 2017-05-08 DIAGNOSIS — S335XXA Sprain of ligaments of lumbar spine, initial encounter: Secondary | ICD-10-CM | POA: Diagnosis not present

## 2017-05-08 DIAGNOSIS — S134XXA Sprain of ligaments of cervical spine, initial encounter: Secondary | ICD-10-CM | POA: Diagnosis not present

## 2017-05-08 DIAGNOSIS — M9901 Segmental and somatic dysfunction of cervical region: Secondary | ICD-10-CM | POA: Diagnosis not present

## 2017-05-08 DIAGNOSIS — M546 Pain in thoracic spine: Secondary | ICD-10-CM | POA: Diagnosis not present

## 2017-05-08 DIAGNOSIS — M9902 Segmental and somatic dysfunction of thoracic region: Secondary | ICD-10-CM | POA: Diagnosis not present

## 2017-05-08 DIAGNOSIS — M9903 Segmental and somatic dysfunction of lumbar region: Secondary | ICD-10-CM | POA: Diagnosis not present

## 2017-05-08 DIAGNOSIS — M4004 Postural kyphosis, thoracic region: Secondary | ICD-10-CM | POA: Diagnosis not present

## 2017-05-15 DIAGNOSIS — M546 Pain in thoracic spine: Secondary | ICD-10-CM | POA: Diagnosis not present

## 2017-05-15 DIAGNOSIS — M9903 Segmental and somatic dysfunction of lumbar region: Secondary | ICD-10-CM | POA: Diagnosis not present

## 2017-05-15 DIAGNOSIS — S335XXA Sprain of ligaments of lumbar spine, initial encounter: Secondary | ICD-10-CM | POA: Diagnosis not present

## 2017-05-15 DIAGNOSIS — M9902 Segmental and somatic dysfunction of thoracic region: Secondary | ICD-10-CM | POA: Diagnosis not present

## 2017-05-15 DIAGNOSIS — S134XXA Sprain of ligaments of cervical spine, initial encounter: Secondary | ICD-10-CM | POA: Diagnosis not present

## 2017-05-15 DIAGNOSIS — M9901 Segmental and somatic dysfunction of cervical region: Secondary | ICD-10-CM | POA: Diagnosis not present

## 2017-05-15 DIAGNOSIS — M4004 Postural kyphosis, thoracic region: Secondary | ICD-10-CM | POA: Diagnosis not present

## 2017-05-31 DIAGNOSIS — M9902 Segmental and somatic dysfunction of thoracic region: Secondary | ICD-10-CM | POA: Diagnosis not present

## 2017-05-31 DIAGNOSIS — M9903 Segmental and somatic dysfunction of lumbar region: Secondary | ICD-10-CM | POA: Diagnosis not present

## 2017-05-31 DIAGNOSIS — M4004 Postural kyphosis, thoracic region: Secondary | ICD-10-CM | POA: Diagnosis not present

## 2017-05-31 DIAGNOSIS — M9901 Segmental and somatic dysfunction of cervical region: Secondary | ICD-10-CM | POA: Diagnosis not present

## 2017-05-31 DIAGNOSIS — M546 Pain in thoracic spine: Secondary | ICD-10-CM | POA: Diagnosis not present

## 2017-05-31 DIAGNOSIS — S335XXA Sprain of ligaments of lumbar spine, initial encounter: Secondary | ICD-10-CM | POA: Diagnosis not present

## 2017-05-31 DIAGNOSIS — S134XXA Sprain of ligaments of cervical spine, initial encounter: Secondary | ICD-10-CM | POA: Diagnosis not present

## 2017-06-05 DIAGNOSIS — Z9989 Dependence on other enabling machines and devices: Secondary | ICD-10-CM | POA: Diagnosis not present

## 2017-06-05 DIAGNOSIS — G4733 Obstructive sleep apnea (adult) (pediatric): Secondary | ICD-10-CM | POA: Diagnosis not present

## 2017-06-05 DIAGNOSIS — N2581 Secondary hyperparathyroidism of renal origin: Secondary | ICD-10-CM | POA: Diagnosis not present

## 2017-06-05 DIAGNOSIS — I129 Hypertensive chronic kidney disease with stage 1 through stage 4 chronic kidney disease, or unspecified chronic kidney disease: Secondary | ICD-10-CM | POA: Diagnosis not present

## 2017-06-05 DIAGNOSIS — Z8739 Personal history of other diseases of the musculoskeletal system and connective tissue: Secondary | ICD-10-CM | POA: Diagnosis not present

## 2017-06-05 DIAGNOSIS — N184 Chronic kidney disease, stage 4 (severe): Secondary | ICD-10-CM | POA: Diagnosis not present

## 2017-06-05 DIAGNOSIS — E118 Type 2 diabetes mellitus with unspecified complications: Secondary | ICD-10-CM | POA: Diagnosis not present

## 2017-06-14 DIAGNOSIS — M9903 Segmental and somatic dysfunction of lumbar region: Secondary | ICD-10-CM | POA: Diagnosis not present

## 2017-06-14 DIAGNOSIS — M546 Pain in thoracic spine: Secondary | ICD-10-CM | POA: Diagnosis not present

## 2017-06-14 DIAGNOSIS — M9901 Segmental and somatic dysfunction of cervical region: Secondary | ICD-10-CM | POA: Diagnosis not present

## 2017-06-14 DIAGNOSIS — M9902 Segmental and somatic dysfunction of thoracic region: Secondary | ICD-10-CM | POA: Diagnosis not present

## 2017-06-14 DIAGNOSIS — M4004 Postural kyphosis, thoracic region: Secondary | ICD-10-CM | POA: Diagnosis not present

## 2017-06-14 DIAGNOSIS — S335XXA Sprain of ligaments of lumbar spine, initial encounter: Secondary | ICD-10-CM | POA: Diagnosis not present

## 2017-06-14 DIAGNOSIS — S134XXA Sprain of ligaments of cervical spine, initial encounter: Secondary | ICD-10-CM | POA: Diagnosis not present

## 2017-06-28 DIAGNOSIS — E669 Obesity, unspecified: Secondary | ICD-10-CM | POA: Diagnosis not present

## 2017-06-28 DIAGNOSIS — Z8739 Personal history of other diseases of the musculoskeletal system and connective tissue: Secondary | ICD-10-CM | POA: Diagnosis not present

## 2017-06-28 DIAGNOSIS — I129 Hypertensive chronic kidney disease with stage 1 through stage 4 chronic kidney disease, or unspecified chronic kidney disease: Secondary | ICD-10-CM | POA: Diagnosis not present

## 2017-06-28 DIAGNOSIS — N2581 Secondary hyperparathyroidism of renal origin: Secondary | ICD-10-CM | POA: Diagnosis not present

## 2017-06-28 DIAGNOSIS — N189 Chronic kidney disease, unspecified: Secondary | ICD-10-CM | POA: Diagnosis not present

## 2017-06-28 DIAGNOSIS — E118 Type 2 diabetes mellitus with unspecified complications: Secondary | ICD-10-CM | POA: Diagnosis not present

## 2017-06-28 DIAGNOSIS — N184 Chronic kidney disease, stage 4 (severe): Secondary | ICD-10-CM | POA: Diagnosis not present

## 2017-06-28 DIAGNOSIS — G4733 Obstructive sleep apnea (adult) (pediatric): Secondary | ICD-10-CM | POA: Diagnosis not present

## 2017-06-28 DIAGNOSIS — D631 Anemia in chronic kidney disease: Secondary | ICD-10-CM | POA: Diagnosis not present

## 2017-06-28 DIAGNOSIS — Z9989 Dependence on other enabling machines and devices: Secondary | ICD-10-CM | POA: Diagnosis not present

## 2017-07-12 DIAGNOSIS — S335XXA Sprain of ligaments of lumbar spine, initial encounter: Secondary | ICD-10-CM | POA: Diagnosis not present

## 2017-07-12 DIAGNOSIS — M546 Pain in thoracic spine: Secondary | ICD-10-CM | POA: Diagnosis not present

## 2017-07-12 DIAGNOSIS — M9902 Segmental and somatic dysfunction of thoracic region: Secondary | ICD-10-CM | POA: Diagnosis not present

## 2017-07-12 DIAGNOSIS — M9901 Segmental and somatic dysfunction of cervical region: Secondary | ICD-10-CM | POA: Diagnosis not present

## 2017-07-12 DIAGNOSIS — M9903 Segmental and somatic dysfunction of lumbar region: Secondary | ICD-10-CM | POA: Diagnosis not present

## 2017-07-12 DIAGNOSIS — M4004 Postural kyphosis, thoracic region: Secondary | ICD-10-CM | POA: Diagnosis not present

## 2017-07-12 DIAGNOSIS — S134XXA Sprain of ligaments of cervical spine, initial encounter: Secondary | ICD-10-CM | POA: Diagnosis not present

## 2017-08-02 DIAGNOSIS — M4004 Postural kyphosis, thoracic region: Secondary | ICD-10-CM | POA: Diagnosis not present

## 2017-08-02 DIAGNOSIS — M9903 Segmental and somatic dysfunction of lumbar region: Secondary | ICD-10-CM | POA: Diagnosis not present

## 2017-08-02 DIAGNOSIS — S335XXA Sprain of ligaments of lumbar spine, initial encounter: Secondary | ICD-10-CM | POA: Diagnosis not present

## 2017-08-02 DIAGNOSIS — M546 Pain in thoracic spine: Secondary | ICD-10-CM | POA: Diagnosis not present

## 2017-08-02 DIAGNOSIS — M9902 Segmental and somatic dysfunction of thoracic region: Secondary | ICD-10-CM | POA: Diagnosis not present

## 2017-08-02 DIAGNOSIS — S134XXA Sprain of ligaments of cervical spine, initial encounter: Secondary | ICD-10-CM | POA: Diagnosis not present

## 2017-08-02 DIAGNOSIS — M9901 Segmental and somatic dysfunction of cervical region: Secondary | ICD-10-CM | POA: Diagnosis not present

## 2017-08-13 DIAGNOSIS — M79672 Pain in left foot: Secondary | ICD-10-CM | POA: Diagnosis not present

## 2017-08-13 DIAGNOSIS — M25579 Pain in unspecified ankle and joints of unspecified foot: Secondary | ICD-10-CM | POA: Diagnosis not present

## 2017-08-16 DIAGNOSIS — N401 Enlarged prostate with lower urinary tract symptoms: Secondary | ICD-10-CM | POA: Diagnosis not present

## 2017-08-16 DIAGNOSIS — E291 Testicular hypofunction: Secondary | ICD-10-CM | POA: Diagnosis not present

## 2017-08-22 DIAGNOSIS — N5201 Erectile dysfunction due to arterial insufficiency: Secondary | ICD-10-CM | POA: Diagnosis not present

## 2017-08-22 DIAGNOSIS — E291 Testicular hypofunction: Secondary | ICD-10-CM | POA: Diagnosis not present

## 2017-09-03 DIAGNOSIS — I129 Hypertensive chronic kidney disease with stage 1 through stage 4 chronic kidney disease, or unspecified chronic kidney disease: Secondary | ICD-10-CM | POA: Diagnosis not present

## 2017-09-03 DIAGNOSIS — E118 Type 2 diabetes mellitus with unspecified complications: Secondary | ICD-10-CM | POA: Diagnosis not present

## 2017-09-03 DIAGNOSIS — E1122 Type 2 diabetes mellitus with diabetic chronic kidney disease: Secondary | ICD-10-CM | POA: Diagnosis not present

## 2017-09-03 DIAGNOSIS — E669 Obesity, unspecified: Secondary | ICD-10-CM | POA: Diagnosis not present

## 2017-09-03 DIAGNOSIS — N2581 Secondary hyperparathyroidism of renal origin: Secondary | ICD-10-CM | POA: Diagnosis not present

## 2017-09-03 DIAGNOSIS — Z8739 Personal history of other diseases of the musculoskeletal system and connective tissue: Secondary | ICD-10-CM | POA: Diagnosis not present

## 2017-09-03 DIAGNOSIS — Z23 Encounter for immunization: Secondary | ICD-10-CM | POA: Diagnosis not present

## 2017-09-03 DIAGNOSIS — Z9989 Dependence on other enabling machines and devices: Secondary | ICD-10-CM | POA: Diagnosis not present

## 2017-09-03 DIAGNOSIS — D631 Anemia in chronic kidney disease: Secondary | ICD-10-CM | POA: Diagnosis not present

## 2017-09-03 DIAGNOSIS — N184 Chronic kidney disease, stage 4 (severe): Secondary | ICD-10-CM | POA: Diagnosis not present

## 2017-09-03 DIAGNOSIS — G4733 Obstructive sleep apnea (adult) (pediatric): Secondary | ICD-10-CM | POA: Diagnosis not present

## 2017-09-03 DIAGNOSIS — N189 Chronic kidney disease, unspecified: Secondary | ICD-10-CM | POA: Diagnosis not present

## 2017-09-03 DIAGNOSIS — Z87442 Personal history of urinary calculi: Secondary | ICD-10-CM | POA: Diagnosis not present

## 2017-10-04 DIAGNOSIS — N184 Chronic kidney disease, stage 4 (severe): Secondary | ICD-10-CM | POA: Diagnosis not present

## 2017-10-16 DIAGNOSIS — D1039 Benign neoplasm of other parts of mouth: Secondary | ICD-10-CM | POA: Diagnosis not present

## 2017-10-30 DIAGNOSIS — K111 Hypertrophy of salivary gland: Secondary | ICD-10-CM | POA: Diagnosis not present

## 2017-11-01 DIAGNOSIS — K111 Hypertrophy of salivary gland: Secondary | ICD-10-CM | POA: Diagnosis not present

## 2017-11-15 DIAGNOSIS — S335XXA Sprain of ligaments of lumbar spine, initial encounter: Secondary | ICD-10-CM | POA: Diagnosis not present

## 2017-11-15 DIAGNOSIS — M546 Pain in thoracic spine: Secondary | ICD-10-CM | POA: Diagnosis not present

## 2017-11-15 DIAGNOSIS — M9902 Segmental and somatic dysfunction of thoracic region: Secondary | ICD-10-CM | POA: Diagnosis not present

## 2017-11-15 DIAGNOSIS — M9901 Segmental and somatic dysfunction of cervical region: Secondary | ICD-10-CM | POA: Diagnosis not present

## 2017-11-15 DIAGNOSIS — M9903 Segmental and somatic dysfunction of lumbar region: Secondary | ICD-10-CM | POA: Diagnosis not present

## 2017-11-15 DIAGNOSIS — S134XXA Sprain of ligaments of cervical spine, initial encounter: Secondary | ICD-10-CM | POA: Diagnosis not present

## 2017-11-15 DIAGNOSIS — M4004 Postural kyphosis, thoracic region: Secondary | ICD-10-CM | POA: Diagnosis not present

## 2017-11-16 DIAGNOSIS — M9903 Segmental and somatic dysfunction of lumbar region: Secondary | ICD-10-CM | POA: Diagnosis not present

## 2017-11-16 DIAGNOSIS — M9901 Segmental and somatic dysfunction of cervical region: Secondary | ICD-10-CM | POA: Diagnosis not present

## 2017-11-16 DIAGNOSIS — S335XXA Sprain of ligaments of lumbar spine, initial encounter: Secondary | ICD-10-CM | POA: Diagnosis not present

## 2017-11-16 DIAGNOSIS — M9902 Segmental and somatic dysfunction of thoracic region: Secondary | ICD-10-CM | POA: Diagnosis not present

## 2017-11-16 DIAGNOSIS — S134XXA Sprain of ligaments of cervical spine, initial encounter: Secondary | ICD-10-CM | POA: Diagnosis not present

## 2017-11-16 DIAGNOSIS — M4004 Postural kyphosis, thoracic region: Secondary | ICD-10-CM | POA: Diagnosis not present

## 2017-11-16 DIAGNOSIS — M546 Pain in thoracic spine: Secondary | ICD-10-CM | POA: Diagnosis not present

## 2017-11-22 DIAGNOSIS — M2550 Pain in unspecified joint: Secondary | ICD-10-CM | POA: Diagnosis not present

## 2017-11-22 DIAGNOSIS — E559 Vitamin D deficiency, unspecified: Secondary | ICD-10-CM | POA: Diagnosis not present

## 2017-11-22 DIAGNOSIS — K210 Gastro-esophageal reflux disease with esophagitis: Secondary | ICD-10-CM | POA: Diagnosis not present

## 2017-11-22 DIAGNOSIS — R7302 Impaired glucose tolerance (oral): Secondary | ICD-10-CM | POA: Diagnosis not present

## 2017-11-22 DIAGNOSIS — E119 Type 2 diabetes mellitus without complications: Secondary | ICD-10-CM | POA: Diagnosis not present

## 2017-11-22 DIAGNOSIS — I10 Essential (primary) hypertension: Secondary | ICD-10-CM | POA: Diagnosis not present

## 2017-11-22 DIAGNOSIS — E291 Testicular hypofunction: Secondary | ICD-10-CM | POA: Diagnosis not present

## 2017-11-28 ENCOUNTER — Encounter: Payer: Self-pay | Admitting: Nutrition

## 2017-11-28 ENCOUNTER — Encounter: Payer: Medicare Other | Attending: Nephrology | Admitting: Nutrition

## 2017-11-28 VITALS — Ht 69.0 in | Wt 211.0 lb

## 2017-11-28 DIAGNOSIS — I10 Essential (primary) hypertension: Secondary | ICD-10-CM

## 2017-11-28 DIAGNOSIS — I129 Hypertensive chronic kidney disease with stage 1 through stage 4 chronic kidney disease, or unspecified chronic kidney disease: Secondary | ICD-10-CM | POA: Diagnosis not present

## 2017-11-28 DIAGNOSIS — E1122 Type 2 diabetes mellitus with diabetic chronic kidney disease: Secondary | ICD-10-CM | POA: Diagnosis not present

## 2017-11-28 DIAGNOSIS — E7800 Pure hypercholesterolemia, unspecified: Secondary | ICD-10-CM

## 2017-11-28 DIAGNOSIS — Z713 Dietary counseling and surveillance: Secondary | ICD-10-CM | POA: Insufficient documentation

## 2017-11-28 DIAGNOSIS — N184 Chronic kidney disease, stage 4 (severe): Secondary | ICD-10-CM | POA: Diagnosis not present

## 2017-11-28 DIAGNOSIS — E669 Obesity, unspecified: Secondary | ICD-10-CM | POA: Insufficient documentation

## 2017-11-28 DIAGNOSIS — N183 Chronic kidney disease, stage 3 unspecified: Secondary | ICD-10-CM

## 2017-11-28 NOTE — Patient Instructions (Signed)
Goals 1. Follow Plate MEthod 2. Cut out processed high salt high fat high sugar foods 3. Exercise 30 minutes 3-4 times per week 4. Cut down fast foods 5. Drink only water Bring labs  Next vist Follow low sodium diet  Use Mrs. Deliah Boston

## 2017-11-28 NOTE — Progress Notes (Signed)
  Medical Nutrition Therapy:  Appt start time: 0800 end time:  0930.  Assessment:  Primary concerns today: CKD-Stg 3, HTN, Obesity, DM Type 2, HTN and Hyperlipidemia. Lives with his wife. He eats out a lot. He would like lose some weight.  He is also caring for his mom. She does a lot of the cooking in the home and brings in a lot of snacks/junk food. He admits to eating a higher fat, salt and calorie diet since his mom is cooking breakfast and buys junk food.  Still works from home. He has a treadmill and  ellipical and uses it at times.  He reports his last A1C was 5.6%. Not on any meds for DM. Engaged to make changes to improve blood sugars and weight control.   Diet is excessive in calories, sodium and fat and low in fresh fruits and vegetables.  Preferred Learning Style:   No preference indicated   Learning Readiness:   Ready  Change in progress   MEDICATIONS: See list   DIETARY INTAKE:  24-hr recall:  B ( AM): Scambled eggs. Bacon or sausage, white or wheat bread,  water crystal light Snk ( AM):   L ( PM): Skips Snk ( PM):misc cookies D ( PM): Green beans, corn, biscuit, [place], Sweet tea;  Usually eats out- bologna burger or fast foods, water  Snk ( PM): popcorn or cookies Beverages: Water, crystal lights  Usual physical activity: ADL  Estimated energy needs: 1800  calories 200 g carbohydrates 135 g protein 50 g fat  Progress Towards Goal(s):  In progress.   Nutritional Diagnosis:  NB-1.1 Food and nutrition-related knowledge deficit As related to CKD, DM and HTN  As evidenced by Stg 3 CKD, CHol > 200 and BMI > 30.    Intervention:  Nutrition and Diabetes education provided on My Plate, CHO counting, meal planning, portion sizes, timing of meals, avoiding snacks between meals unless having a low blood sugar, target ranges for A1C and blood sugars, signs/symptoms and treatment of hyper/hypoglycemia, monitoring blood sugars, taking medications as prescribed,  benefits of exercising 30 minutes per day and prevention of complications of DM. Low Fat Low Sodium, Diet for CKD Stg 3, High FIber  Teaching Method Utilized:  Visual Auditory Hands on  Handouts given during visit include:  The Plate Method   Meal Plan Card  Low Sodium  Nutrition for CKD  Barriers to learning/adherence to lifestyle change: none  Demonstrated degree of understanding via:  Teach Back   Monitoring/Evaluation:  Dietary intake, exercise, meal planning, SBG, and body weight in 3 month(s).

## 2017-12-14 DIAGNOSIS — M4004 Postural kyphosis, thoracic region: Secondary | ICD-10-CM | POA: Diagnosis not present

## 2017-12-14 DIAGNOSIS — S134XXA Sprain of ligaments of cervical spine, initial encounter: Secondary | ICD-10-CM | POA: Diagnosis not present

## 2017-12-14 DIAGNOSIS — M9903 Segmental and somatic dysfunction of lumbar region: Secondary | ICD-10-CM | POA: Diagnosis not present

## 2017-12-14 DIAGNOSIS — M9902 Segmental and somatic dysfunction of thoracic region: Secondary | ICD-10-CM | POA: Diagnosis not present

## 2017-12-14 DIAGNOSIS — M546 Pain in thoracic spine: Secondary | ICD-10-CM | POA: Diagnosis not present

## 2017-12-14 DIAGNOSIS — S335XXA Sprain of ligaments of lumbar spine, initial encounter: Secondary | ICD-10-CM | POA: Diagnosis not present

## 2017-12-14 DIAGNOSIS — M9901 Segmental and somatic dysfunction of cervical region: Secondary | ICD-10-CM | POA: Diagnosis not present

## 2017-12-17 DIAGNOSIS — M9902 Segmental and somatic dysfunction of thoracic region: Secondary | ICD-10-CM | POA: Diagnosis not present

## 2017-12-17 DIAGNOSIS — M9901 Segmental and somatic dysfunction of cervical region: Secondary | ICD-10-CM | POA: Diagnosis not present

## 2017-12-17 DIAGNOSIS — S134XXA Sprain of ligaments of cervical spine, initial encounter: Secondary | ICD-10-CM | POA: Diagnosis not present

## 2017-12-17 DIAGNOSIS — S335XXA Sprain of ligaments of lumbar spine, initial encounter: Secondary | ICD-10-CM | POA: Diagnosis not present

## 2017-12-17 DIAGNOSIS — M546 Pain in thoracic spine: Secondary | ICD-10-CM | POA: Diagnosis not present

## 2017-12-17 DIAGNOSIS — M4004 Postural kyphosis, thoracic region: Secondary | ICD-10-CM | POA: Diagnosis not present

## 2017-12-17 DIAGNOSIS — M9903 Segmental and somatic dysfunction of lumbar region: Secondary | ICD-10-CM | POA: Diagnosis not present

## 2017-12-20 DIAGNOSIS — M9901 Segmental and somatic dysfunction of cervical region: Secondary | ICD-10-CM | POA: Diagnosis not present

## 2017-12-20 DIAGNOSIS — M546 Pain in thoracic spine: Secondary | ICD-10-CM | POA: Diagnosis not present

## 2017-12-20 DIAGNOSIS — M9903 Segmental and somatic dysfunction of lumbar region: Secondary | ICD-10-CM | POA: Diagnosis not present

## 2017-12-20 DIAGNOSIS — M9902 Segmental and somatic dysfunction of thoracic region: Secondary | ICD-10-CM | POA: Diagnosis not present

## 2017-12-20 DIAGNOSIS — S335XXA Sprain of ligaments of lumbar spine, initial encounter: Secondary | ICD-10-CM | POA: Diagnosis not present

## 2017-12-20 DIAGNOSIS — S134XXA Sprain of ligaments of cervical spine, initial encounter: Secondary | ICD-10-CM | POA: Diagnosis not present

## 2017-12-20 DIAGNOSIS — M4004 Postural kyphosis, thoracic region: Secondary | ICD-10-CM | POA: Diagnosis not present

## 2017-12-25 DIAGNOSIS — G4733 Obstructive sleep apnea (adult) (pediatric): Secondary | ICD-10-CM | POA: Diagnosis not present

## 2017-12-25 DIAGNOSIS — N189 Chronic kidney disease, unspecified: Secondary | ICD-10-CM | POA: Diagnosis not present

## 2017-12-25 DIAGNOSIS — Z9989 Dependence on other enabling machines and devices: Secondary | ICD-10-CM | POA: Diagnosis not present

## 2017-12-25 DIAGNOSIS — I129 Hypertensive chronic kidney disease with stage 1 through stage 4 chronic kidney disease, or unspecified chronic kidney disease: Secondary | ICD-10-CM | POA: Diagnosis not present

## 2017-12-25 DIAGNOSIS — Z87442 Personal history of urinary calculi: Secondary | ICD-10-CM | POA: Diagnosis not present

## 2017-12-25 DIAGNOSIS — N184 Chronic kidney disease, stage 4 (severe): Secondary | ICD-10-CM | POA: Diagnosis not present

## 2017-12-25 DIAGNOSIS — Z8739 Personal history of other diseases of the musculoskeletal system and connective tissue: Secondary | ICD-10-CM | POA: Diagnosis not present

## 2017-12-25 DIAGNOSIS — D631 Anemia in chronic kidney disease: Secondary | ICD-10-CM | POA: Diagnosis not present

## 2017-12-25 DIAGNOSIS — N2581 Secondary hyperparathyroidism of renal origin: Secondary | ICD-10-CM | POA: Diagnosis not present

## 2017-12-25 DIAGNOSIS — E669 Obesity, unspecified: Secondary | ICD-10-CM | POA: Diagnosis not present

## 2017-12-25 DIAGNOSIS — E1122 Type 2 diabetes mellitus with diabetic chronic kidney disease: Secondary | ICD-10-CM | POA: Diagnosis not present

## 2017-12-28 DIAGNOSIS — M9903 Segmental and somatic dysfunction of lumbar region: Secondary | ICD-10-CM | POA: Diagnosis not present

## 2017-12-28 DIAGNOSIS — M546 Pain in thoracic spine: Secondary | ICD-10-CM | POA: Diagnosis not present

## 2017-12-28 DIAGNOSIS — M9901 Segmental and somatic dysfunction of cervical region: Secondary | ICD-10-CM | POA: Diagnosis not present

## 2017-12-28 DIAGNOSIS — S335XXA Sprain of ligaments of lumbar spine, initial encounter: Secondary | ICD-10-CM | POA: Diagnosis not present

## 2017-12-28 DIAGNOSIS — S134XXA Sprain of ligaments of cervical spine, initial encounter: Secondary | ICD-10-CM | POA: Diagnosis not present

## 2017-12-28 DIAGNOSIS — M4004 Postural kyphosis, thoracic region: Secondary | ICD-10-CM | POA: Diagnosis not present

## 2017-12-28 DIAGNOSIS — M9902 Segmental and somatic dysfunction of thoracic region: Secondary | ICD-10-CM | POA: Diagnosis not present

## 2018-01-05 DIAGNOSIS — J018 Other acute sinusitis: Secondary | ICD-10-CM | POA: Diagnosis not present

## 2018-01-10 ENCOUNTER — Encounter: Payer: Self-pay | Admitting: Internal Medicine

## 2018-01-10 ENCOUNTER — Ambulatory Visit (INDEPENDENT_AMBULATORY_CARE_PROVIDER_SITE_OTHER): Payer: Medicare Other | Admitting: Internal Medicine

## 2018-01-10 VITALS — BP 134/82 | HR 57 | Ht 69.0 in | Wt 202.6 lb

## 2018-01-10 DIAGNOSIS — G4733 Obstructive sleep apnea (adult) (pediatric): Secondary | ICD-10-CM

## 2018-01-10 DIAGNOSIS — K219 Gastro-esophageal reflux disease without esophagitis: Secondary | ICD-10-CM | POA: Diagnosis not present

## 2018-01-10 MED ORDER — ROSUVASTATIN CALCIUM 5 MG PO TABS: 5.0000 mg | ORAL_TABLET | Freq: Every day | ORAL | 12 refills | Status: DC

## 2018-01-10 NOTE — Progress Notes (Signed)
HPI M never smoker followed for OSA, complicated by GERD NPSG 04/15/13- moderate obstructive sleep apnea-AHI 22.5 per hour. CPAP 10 CWP. Weight 198 pounds.   --------------------------------------------------------------------------------- 01/10/2017-67 year old male never smoker followed for OSA, insomnia, complicated by GERD 1 yr f/u for OSA. Mask has been slipping off of his face a lot more.  He says the mask slippage problem was fixed by replacing with a newer mask. Prefers nasal pillows type. Uses CPAP all night every night. Pressure seems okay. Definitely sleeps better. Still has trouble initiating sleep. Insurance did not cover [facility] so he asks other options-discussed. Complains of postnasal drainage through the day. No chest tightness or cough.  01/10/18- 67 year old male never smoker followed for OSA, insomnia, complicated by GERD ----OSA; DME [facility]. Pt wears CPAP nighlty and DL attached. No new supplies needed and pressure works well. CPAP auto 8-20/ Advanced Ambien 5 mg Treated at [facility] for sinusitis with Z pak.  Asks referral to primary care to establish. Download 97% compliance, AHI 3.9/hour.  Sleeps much better with CPAP.  Bought So Clean machine and it gives him peace of mind.  ROS-see HPI + = positive Constitutional:   No-   weight loss, night sweats, fevers, chills, +fatigue, lassitude. HEENT:   No-  headaches, difficulty swallowing, tooth/dental problems, sore throat,       No-  sneezing, itching, ear ache, nasal congestion, +post nasal drip,  CV:  No-   chest pain, orthopnea, PND, swelling in lower extremities, anasarca, dizziness, palpitations Resp: No-   shortness of breath with exertion or at rest.              No-   productive cough,  No non-productive cough,  No- coughing up of blood.              No-   change in color of mucus.  No- wheezing.   Skin: No-   rash or lesions. GI:  No-   heartburn, indigestion, abdominal pain, nausea, vomiting,  GU:  MS:  No-   joint  pain or swelling.   Neuro-     nothing unusual Psych:  No- change in mood or affect. No depression or anxiety.  No memory loss.  OBJ- Physical Exam General- Alert, Oriented, Affect-appropriate, Distress- none acute,  Skin- rash-none, lesions- none, excoriation- none Lymphadenopathy- none Head- atraumatic            Eyes- Gross vision intact, PERRLA, conjunctivae and secretions clear            Ears- Hearing, canals-normal            Nose- + sniffing, + turbinate edema, no-Septal dev, mucus, polyps, erosion, perforation             Throat- Mallampati III-IV , mucosa clear , drainage- none, tonsils- atrophic Neck- flexible , trachea midline, no stridor , thyroid nl, carotid no bruit Chest - symmetrical excursion , unlabored           Heart/CV- RRR , no murmur heard , no gallop  , no rub, nl s1 s2                           - JVD- none , edema- none, stasis changes- none, varices- none           Lung- clear to P&A, wheeze- none, cough- none , dullness-none, rub- none           Chest wall-  Abd-  Br/  Gen/ Rectal- Not done, not indicated Extrem- cyanosis- none, clubbing, none, atrophy- none, strength- nl Neuro- grossly intact to observation

## 2018-01-10 NOTE — Patient Instructions (Addendum)
We can continue CPAP auto 8-20, mask of choice, humidifier, supplies, airview  Script refilling crestor until you get a primary doctor  Order- Primary care referral to establish

## 2018-01-13 NOTE — Assessment & Plan Note (Signed)
Sleeps better and feels better using CPAP routinely.  Download confirms good compliance and control.  He will continue auto 8-20/Advanced.

## 2018-01-16 DIAGNOSIS — J018 Other acute sinusitis: Secondary | ICD-10-CM | POA: Diagnosis not present

## 2018-01-22 ENCOUNTER — Ambulatory Visit (INDEPENDENT_AMBULATORY_CARE_PROVIDER_SITE_OTHER): Payer: Medicare Other | Admitting: Family Medicine

## 2018-01-22 ENCOUNTER — Encounter: Payer: Self-pay | Admitting: Family Medicine

## 2018-01-22 VITALS — BP 142/82 | HR 72 | Temp 97.7°F | Ht 69.0 in | Wt 207.9 lb

## 2018-01-22 DIAGNOSIS — E782 Mixed hyperlipidemia: Secondary | ICD-10-CM

## 2018-01-22 DIAGNOSIS — G4733 Obstructive sleep apnea (adult) (pediatric): Secondary | ICD-10-CM | POA: Diagnosis not present

## 2018-01-22 DIAGNOSIS — Z9989 Dependence on other enabling machines and devices: Secondary | ICD-10-CM | POA: Diagnosis not present

## 2018-01-22 DIAGNOSIS — Z7689 Persons encountering health services in other specified circumstances: Secondary | ICD-10-CM | POA: Diagnosis not present

## 2018-01-22 DIAGNOSIS — J302 Other seasonal allergic rhinitis: Secondary | ICD-10-CM | POA: Diagnosis not present

## 2018-01-22 DIAGNOSIS — I10 Essential (primary) hypertension: Secondary | ICD-10-CM

## 2018-01-22 DIAGNOSIS — K219 Gastro-esophageal reflux disease without esophagitis: Secondary | ICD-10-CM | POA: Diagnosis not present

## 2018-01-22 NOTE — Progress Notes (Signed)
Patient presents to clinic today to establish care.  SUBJECTIVE: PMH:  Pt is a 67 yo male with pmh sig for chronic kidney disease, hypertension, OSA on CPAP, history of DM-diet-controlled, GERD, HLD, seasonal allergies.  Patient was previously seen by Dr. Jeanann Lewandowsky (now retired) whose office was over on [address] [employer].  Patient is also followed by numerous specialist.  HTN: -Patient is currently taking Norvasc 10 mg, Lasix 40 mg, propranolol 80 mg. -Patient states he check his BP at home.  Systolic typically 1 teens to 130s over 68-80s. -Patient keep a record of this on his phone. -Patient states he was walking on the treadmill but stopped and needs to get back into it. -Patient denies headaches, changes in vision, shortness of breath.  Seasonal allergies: -May take Flonase every once in a while but not on a regular basis -Patient endorses current allergy symptoms including rhinorrhea  OSA: -Patient is on CPAP and wears it nightly.  Kidney disease: -Patient states per recent labs his kidney disease has been stable for 4-5 years. -Patient is followed by nephrology, Dr. Jeneen Rinks [facility] [place] [facility] -Patient does not have recent labs with him. -Of note patient has also had a history of renal calculi  DM type II: -Currently not on any medication. -FSBS at home 96-119 -Patient was on metformin which was d/c'd 2/2 patient's kidney disease. -He was then started on Tradjenta which was later d/c'd -Patient's last hemoglobin A1c was 5.6%. -Patient has been trying to eat better and exercise.  Patient states he needs to get back to walking on the treadmill.  GERD: -Patient is taking Pepcid 40 mg daily -[facility].  HLD: -Patient was on Lipitor. -Is currently on Crestor 5 mg daily -Patient denies new myalgias.  Was having some issues with chest wall muscle pain prior to starting medications.  Allergies: Blue dye    Contrast dye-secondary to kidney function  Past surgical history: Hernia repair in 1991  Social history: Patient was previously divorced but has since remarried.  He has been married x 20 years to his current wife.  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  He mainly works on [employer].  He states he was recently given a job promotion where he has more responsibilities.  He is considering retiring again.  Patient denies alcohol, tobacco, drug use.  Family medical history: Mom-alive Dad-deceased, cancer, heart attack, HLD.  Health Maintenance: Dental --Dr. Eugenio Hoes Vision --Dr. Rutherford Guys, ophthalmology Gastroenterologist--Dr. Barney Drain Nephrologist--Dr. Jeneen Rinks [place] Immunizations --Pneumovax 2017, influenza vaccine 2018. Colonoscopy --2017   Past Medical History:  Diagnosis Date  . Adenoma 11/2009   1.1 cm from TCS  . DM (diabetes mellitus) ([place])   . GERD (gastroesophageal reflux disease)   . High cholesterol   . HTN (hypertension)   . Kidney stones   . Obesity (BMI 30-39.9) DEC 2011 223 LBS  . Rectal bleeding 2011   secondary to hemorrhoids    Past Surgical History:  Procedure Laterality Date  . CIRCUMCISION    . COLONOSCOPY  Dec 2010 [facility]   MOD IH, SIMPLE ADENOMA 1.1 CM  . COLONOSCOPY  11/25/2012   SLF: 1. Sessile polyp measuring 44mm in size was found at the hepatic flesure; polypectomy was performed using snare cautery 2. Moderate diverticulosis was noted in the ascending colon and sigmoid colon. 3. The colon mucosa was otherwise normal 4. Large internal hemorrhoids.   . COLONOSCOPY  N/A 03/01/2017   Procedure: COLONOSCOPY;  Surgeon: Danie Binder, MD;  Location: [facility]ITE;  Service: Endoscopy;  Laterality: N/A;  10:30 AM  . HERNIA REPAIR      Current Outpatient Medications on File Prior to Visit  Medication Sig Dispense Refill  . allopurinol (ZYLOPRIM) 100 MG tablet Take 300 mg by mouth daily.     Marland Kitchen  amLODipine (NORVASC) 10 MG tablet Take 10 mg by mouth daily.    . calcitRIOL (ROCALTROL) 0.25 MCG capsule Take 0.25 mcg by mouth at bedtime.     . famciclovir (FAMVIR) 500 MG tablet Take 1,000 mg by mouth 2 (two) times daily as needed (fever blisters).   5  . famotidine (PEPCID) 40 MG tablet Take 1 tablet (40 mg total) by mouth daily. WITH BREAKFAST 30 tablet 11  . felodipine (PLENDIL) 5 MG 24 hr tablet Take 5 mg by mouth at bedtime.     . propranolol (INDERAL) 80 MG tablet Take 80 mg by mouth 2 (two) times daily.  1  . rosuvastatin (CRESTOR) 5 MG tablet Take 1 tablet (5 mg total) by mouth daily. 30 tablet 12  . zolpidem (AMBIEN) 5 MG tablet 1 at bedtime for sleep as needed (Patient taking differently: Take 5 mg by mouth at bedtime as needed for sleep. ) 30 tablet 5  . acetaminophen (TYLENOL) 500 MG tablet Take 1,000 mg by mouth 2 (two) times daily as needed for headache.    . clomiPHENE (CLOMID) 50 MG tablet Take 25 mg by mouth at bedtime.     . fluticasone (FLONASE) 50 MCG/ACT nasal spray Place 1 spray into both nostrils daily as needed for allergies or rhinitis.    . furosemide (LASIX) 40 MG tablet Take 40 mg by mouth.    . psyllium (METAMUCIL) 58.6 % powder Take 1 packet by mouth daily. As needed for constipation    . vitamin C (ASCORBIC ACID) 500 MG tablet Take 500 mg by mouth 2 (two) times a week.      No current facility-administered medications on file prior to visit.     Allergies  Allergen Reactions  . Dye Fdc Blue [Brilliant Blue Fcf (Fd&C Blue #1)]     PT UNSURE WHICH DYE  . Iodinated Diagnostic Agents Hives    30 YEARS AGO    Family History  Problem Relation Age of Onset  . Lung cancer Father   . Colon cancer Neg Hx   . Colon polyps Neg Hx     Social History   Socioeconomic History  . Marital status: Married    Spouse name: Not on file  . Number of children: Not on file  . Years of education: Not on file  . Highest education level: Not on file  Social Needs  .  Financial resource strain: Not on file  . Food insecurity - worry: Not on file  . Food insecurity - inability: Not on file  . Transportation needs - medical: Not on file  . Transportation needs - non-medical: Not on file  Occupational History  . Occupation: Chief Strategy Officer for [employer]  Tobacco Use  . Smoking status: Never Smoker  . Smokeless tobacco: Never Used  . Tobacco comment: Never smoked  Substance and Sexual Activity  . Alcohol use: No  . Drug use: No  . Sexual activity: No  Other Topics Concern  . Not on file  Social History Narrative   NO KIDS. MARRIED TO 2ND WIFE.    ROS General: Denies fever, chills,  night sweats, changes in weight, changes in appetite HEENT: Denies headaches, ear pain, changes in vision, sore throat  +rhinorrhea CV: Denies CP, palpitations, SOB, orthopnea Pulm: Denies SOB, cough, wheezing GI: Denies abdominal pain, nausea, vomiting, diarrhea, constipation GU: Denies dysuria, hematuria, frequency, vaginal discharge Msk: Denies muscle cramps, joint pains Neuro: Denies weakness, numbness, tingling Skin: Denies rashes, bruising Psych: Denies depression, anxiety, hallucinations  BP (!) 142/82 (BP Location: Left Arm, Patient Position: Sitting, Cuff Size: Normal)   Pulse 72   Temp 97.7 F (36.5 C) (Oral)   Ht 5\' 9"  (1.753 m)   Wt 207 lb 14.4 oz (94.3 kg)   SpO2 97%   BMI 30.70 kg/m   Physical Exam Gen. Pleasant, well developed, well-nourished, in NAD HEENT - [place]/AT, PERRL, no scleral icterus, no nasal drainage, pharynx without erythema or exudate. Neck: No JVD, no thyromegaly, no carotid bruits Lungs: no use of accessory muscles, no dullness to percussion, CTAB, no wheezes, rales or rhonchi Cardiovascular: RRR, No r/g/m, no peripheral edema Abdomen: BS present, soft, nontender,nondistended Neuro:  A&Ox3, CN II-XII intact, normal gait Skin:  Warm, dry, intact, no lesions Psych: normal affect, mood appropriate  No results found for this or any previous  visit (from the past 2160 hour(s)).  Assessment/Plan: Essential hypertension -Continue current medications including Norvasc 10 mg, propranolol 80 mg, Lasix 40 mg -Lifestyle modifications encouraged -Continue checking BP at home -Follow-up cardiology PRN  Gastroesophageal reflux disease, esophagitis presence not specified -Continue Pepcid 40 mg daily -Given handout on foods to avoid as they make GERD symptoms worse -[facility] PRN  Mixed hyperlipidemia -Continue Crestor 5 mg daily -We will recheck lipid panel in the next few months  Seasonal allergies -Discussed taking Flonase on a regular basis given current nasal symptoms -Consider daily allergy pill.  Encounter to establish care -We reviewed the PMH, PSH, FH, SH, Meds and Allergies. -We provided refills for any medications we will prescribe as needed. -We addressed current concerns per orders and patient instructions. -We have asked for records for pertinent exams, studies, vaccines and notes from previous providers. -We have advised patient to follow up per instructions below.  OSA on CPAP -Continue CPAP nightly  Follow-up PRN.  Schedule CPE in the next several months.  Grier Mitts, MD

## 2018-01-22 NOTE — Patient Instructions (Addendum)
Allergies, Adult An allergy is when your body's defense system (immune system) overreacts to an otherwise harmless substance (allergen) that you breathe in or eat or something that touches your skin. When you come into contact with something that you are allergic to, your immune system produces certain proteins (antibodies). These proteins cause cells to release chemicals (histamines) that trigger the symptoms of an allergic reaction. Allergies often affect the nasal passages (allergic rhinitis), eyes (allergic conjunctivitis), skin (atopic dermatitis), and stomach. Allergies can be mild or severe. Allergies cannot spread from person to person (are not contagious). They can develop at any age and may be outgrown. What increases the risk? You may be at greater risk of allergies if other people in your family have allergies. What are the signs or symptoms? Symptoms depend on what type of allergy you have. They may include:  Runny, stuffy nose.  Sneezing.  Itchy mouth, ears, or throat.  Postnasal drip.  Sore throat.  Itchy, red, watery, or puffy eyes.  Skin rash or hives.  Stomach pain.  Vomiting.  Diarrhea.  Bloating.  Wheezing or coughing.  People with a severe allergy to food, medicine, or an insect bite may have a life-threatening allergic reaction (anaphylaxis). Symptoms of anaphylaxis include:  Hives.  Itching.  Flushed face.  Swollen lips, tongue, or mouth.  Tight or swollen throat.  Chest pain or tightness in the chest.  Trouble breathing or shortness of breath.  Rapid heartbeat.  Dizziness or fainting.  Vomiting.  Diarrhea.  Pain in the abdomen.  How is this diagnosed? This condition is diagnosed based on:  Your symptoms.  Your family and medical history.  A physical exam.  You may need to see a health care provider who specializes in treating allergies (allergist). You may also have tests, including:  Skin tests to see which allergens are  causing your symptoms, such as: ? Skin prick test. In this test, your skin is pricked with a tiny needle and exposed to small amounts of possible allergens to see if your skin reacts. ? Intradermal skin test. In this test, a small amount of allergen is injected under your skin to see if your skin reacts. ? Patch test. In this test, a small amount of allergen is placed on your skin and then your skin is covered with a bandage. Your health care provider will check your skin after a couple of days to see if a rash has developed.  Blood tests.  Challenges tests. In this test, you inhale a small amount of allergen by mouth to see if you have an allergic reaction.  You may also be asked to:  Keep a food diary. A food diary is a record of all the foods and drinks you have in a day and any symptoms you experience.  Practice an elimination diet. An elimination diet involves eliminating specific foods from your diet and then adding them back in one by one to find out if a certain food causes an allergic reaction.  How is this treated? Treatment for allergies depends on your symptoms. Treatment may include:  Cold compresses to soothe itching and swelling.  Eye drops.  Nasal sprays.  Using a saline spray or container (neti pot) to flush out the nose (nasal irrigation). These methods can help clear away mucus and keep the nasal passages moist.  Using a humidifier.  Oral antihistamines or other medicines to block allergic reaction and inflammation.  Skin creams to treat rashes or itching.  Diet changes to   eliminate food allergy triggers.  Repeated exposure to tiny amounts of allergens to build up a tolerance and prevent future allergic reactions (immunotherapy). These include: ? Allergy shots. ? Oral treatment. This involves taking small doses of an allergen under the tongue (sublingual immunotherapy).  Emergency epinephrine injection (auto-injector) in case of an allergic emergency. This is  a self-injectable, pre-measured medicine that must be given within the first few minutes of a serious allergic reaction.  Follow these instructions at home:  Avoid known allergens whenever possible.  If you suffer from airborne allergens, wash out your nose daily. You can do this with a saline spray or a neti pot to flush out your nose (nasal irrigation).  Take over-the-counter and prescription medicines only as told by your health care provider.  Keep all follow-up visits as told by your health care provider. This is important.  If you are at risk of a severe allergic reaction (anaphylaxis), keep your auto-injector with you at all times.  If you have ever had anaphylaxis, wear a medical alert bracelet or necklace that states you have a severe allergy. Contact a health care provider if:  Your symptoms do not improve with treatment. Get help right away if:  You have symptoms of anaphylaxis, such as: ? Swollen mouth, tongue, or throat. ? Pain or tightness in your chest. ? Trouble breathing or shortness of breath. ? Dizziness or fainting. ? Severe abdominal pain, vomiting, or diarrhea. This information is not intended to replace advice given to you by your health care provider. Make sure you discuss any questions you have with your health care provider. Document Released: 02/13/2003 Document Revised: 03/21/2017 Document Reviewed: 06/07/2016 Elsevier Interactive Patient Education  2018 [employer].  Cholesterol Cholesterol is a white, waxy, fat-like substance that is needed by the human body in small amounts. The liver makes all the cholesterol we need. Cholesterol is carried from the liver by the blood through the blood vessels. Deposits of cholesterol (plaques) may build up on blood vessel (artery) walls. Plaques make the arteries narrower and stiffer. Cholesterol plaques increase the risk for heart attack and stroke. You cannot feel your cholesterol level even if it is very high. The  only way to know that it is high is to have a blood test. Once you know your cholesterol levels, you should keep a record of the test results. Work with your health care provider to keep your levels in the desired range. What do the results mean?  Total cholesterol is a rough measure of all the cholesterol in your blood.  LDL (low-density lipoprotein) is the "bad" cholesterol. This is the type that causes plaque to build up on the artery walls. You want this level to be low.  HDL (high-density lipoprotein) is the "good" cholesterol because it cleans the arteries and carries the LDL away. You want this level to be high.  Triglycerides are fat that the body can either burn for energy or store. High levels are closely linked to heart disease. What are the desired levels of cholesterol?  Total cholesterol below 200.  LDL below 100 for people who are at risk, below 70 for people at very high risk.  HDL above 40 is good. A level of 60 or higher is considered to be protective against heart disease.  Triglycerides below 150. How can I lower my cholesterol? Diet Follow your diet program as told by your health care provider.  Choose fish or white meat chicken and [place], roasted or baked. Limit fatty  cuts of red meat, fried foods, and processed meats, such as sausage and lunch meats.  Eat lots of fresh fruits and vegetables.  Choose whole grains, beans, pasta, potatoes, and cereals.  Choose olive oil, corn oil, or canola oil, and use only small amounts.  Avoid butter, mayonnaise, shortening, or palm kernel oils.  Avoid foods with trans fats.  Drink skim or nonfat milk and eat low-fat or nonfat yogurt and cheeses. Avoid whole milk, cream, ice cream, egg yolks, and full-fat cheeses.  Healthier desserts include angel food cake, ginger snaps, animal crackers, hard candy, popsicles, and low-fat or nonfat frozen yogurt. Avoid pastries, cakes, pies, and cookies.  Exercise  Follow your  exercise program as told by your health care provider. A regular program: ? Helps to decrease LDL and raise HDL. ? Helps with weight control.  Do things that increase your activity level, such as gardening, walking, and taking the stairs.  Ask your health care provider about ways that you can be more active in your daily life.  Medicine  Take over-the-counter and prescription medicines only as told by your health care provider. ? Medicine may be prescribed by your health care provider to help lower cholesterol and decrease the risk for heart disease. This is usually done if diet and exercise have failed to bring down cholesterol levels. ? If you have several risk factors, you may need medicine even if your levels are normal.  This information is not intended to replace advice given to you by your health care provider. Make sure you discuss any questions you have with your health care provider. Document Released: 08/15/2001 Document Revised: 06/17/2016 Document Reviewed: 05/20/2016 Elsevier Interactive Patient Education  2018 [place] DASH stands for "Dietary Approaches to Stop Hypertension." The DASH eating plan is a healthy eating plan that has been shown to reduce high blood pressure (hypertension). It may also reduce your risk for type 2 diabetes, heart disease, and stroke. The DASH eating plan may also help with weight loss. What are tips for following this plan? General guidelines  Avoid eating more than 2,300 mg (milligrams) of salt (sodium) a day. If you have hypertension, you may need to reduce your sodium intake to 1,500 mg a day.  Limit alcohol intake to no more than 1 drink a day for nonpregnant women and 2 drinks a day for men. One drink equals 12 oz of beer, 5 oz of wine, or 1 oz of hard liquor.  Work with your health care provider to maintain a healthy body weight or to lose weight. Ask what an ideal weight is for you.  Get at least 30 minutes of  exercise that causes your heart to beat faster (aerobic exercise) most days of the week. Activities may include walking, swimming, or biking.  Work with your health care provider or diet and nutrition specialist (dietitian) to adjust your eating plan to your individual calorie needs. Reading food labels  Check food labels for the amount of sodium per serving. Choose foods with less than 5 percent of the Daily Value of sodium. Generally, foods with less than 300 mg of sodium per serving fit into this eating plan.  To find whole grains, look for the word "whole" as the first word in the ingredient list. Shopping  Buy products labeled as "low-sodium" or "no salt added."  Buy fresh foods. Avoid canned foods and premade or frozen meals. Cooking  Avoid adding salt when cooking. Use salt-free seasonings or herbs  instead of table salt or sea salt. Check with your health care provider or pharmacist before using salt substitutes.  Do not fry foods. Cook foods using healthy methods such as baking, boiling, grilling, and broiling instead.  Cook with heart-healthy oils, such as olive, canola, soybean, or sunflower oil. Meal planning   Eat a balanced diet that includes: ? 5 or more servings of fruits and vegetables each day. At each meal, try to fill half of your plate with fruits and vegetables. ? Up to 6-8 servings of whole grains each day. ? Less than 6 oz of lean meat, poultry, or fish each day. A 3-oz serving of meat is about the same size as a deck of cards. One egg equals 1 oz. ? 2 servings of low-fat dairy each day. ? A serving of nuts, seeds, or beans 5 times each week. ? Heart-healthy fats. Healthy fats called Omega-3 fatty acids are found in foods such as flaxseeds and coldwater fish, like sardines, salmon, and mackerel.  Limit how much you eat of the following: ? Canned or prepackaged foods. ? Food that is high in trans fat, such as fried foods. ? Food that is high in saturated fat,  such as fatty meat. ? Sweets, desserts, sugary drinks, and other foods with added sugar. ? Full-fat dairy products.  Do not salt foods before eating.  Try to eat at least 2 vegetarian meals each week.  Eat more home-cooked food and less restaurant, buffet, and fast food.  When eating at a restaurant, ask that your food be prepared with less salt or no salt, if possible. What foods are recommended? The items listed may not be a complete list. Talk with your dietitian about what dietary choices are best for you. Grains Whole-grain or whole-wheat bread. Whole-grain or whole-wheat pasta. Brown rice. [place]. Bulgur. Whole-grain and low-sodium cereals. Pita bread. Low-fat, low-sodium crackers. Whole-wheat flour tortillas. Vegetables Fresh or frozen vegetables (raw, steamed, roasted, or grilled). Low-sodium or reduced-sodium tomato and vegetable juice. Low-sodium or reduced-sodium tomato sauce and tomato paste. Low-sodium or reduced-sodium canned vegetables. Fruits All fresh, dried, or frozen fruit. Canned fruit in natural juice (without added sugar). Meat and other protein foods Skinless chicken or [place]. Ground chicken or [place]. Pork with fat trimmed off. Fish and seafood. Egg whites. Dried beans, peas, or lentils. Unsalted nuts, nut butters, and seeds. Unsalted canned beans. Lean cuts of beef with fat trimmed off. Low-sodium, lean deli meat. Dairy Low-fat (1%) or fat-free (skim) milk. Fat-free, low-fat, or reduced-fat cheeses. Nonfat, low-sodium ricotta or cottage cheese. Low-fat or nonfat yogurt. Low-fat, low-sodium cheese. Fats and oils Soft margarine without trans fats. Vegetable oil. Low-fat, reduced-fat, or light mayonnaise and salad dressings (reduced-sodium). Canola, safflower, olive, soybean, and sunflower oils. Avocado. Seasoning and other foods Herbs. Spices. Seasoning mixes without salt. Unsalted popcorn and pretzels. Fat-free sweets. What foods are not recommended? The  items listed may not be a complete list. Talk with your dietitian about what dietary choices are best for you. Grains Baked goods made with fat, such as croissants, muffins, or some breads. Dry pasta or rice meal packs. Vegetables Creamed or fried vegetables. Vegetables in a cheese sauce. Regular canned vegetables (not low-sodium or reduced-sodium). Regular canned tomato sauce and paste (not low-sodium or reduced-sodium). Regular tomato and vegetable juice (not low-sodium or reduced-sodium). Angie Fava. Olives. Fruits Canned fruit in a light or heavy syrup. Fried fruit. Fruit in cream or butter sauce. Meat and other protein foods Fatty cuts of meat. Ribs.  Fried meat. Berniece Salines. Sausage. Bologna and other processed lunch meats. Salami. Fatback. Hotdogs. Bratwurst. Salted nuts and seeds. Canned beans with added salt. Canned or smoked fish. Whole eggs or egg yolks. Chicken or [place] with skin. Dairy Whole or 2% milk, cream, and half-and-half. Whole or full-fat cream cheese. Whole-fat or sweetened yogurt. Full-fat cheese. Nondairy creamers. Whipped toppings. Processed cheese and cheese spreads. Fats and oils Butter. Stick margarine. Lard. Shortening. Ghee. Bacon fat. Tropical oils, such as coconut, palm kernel, or palm oil. Seasoning and other foods Salted popcorn and pretzels. Onion salt, garlic salt, seasoned salt, table salt, and sea salt. Worcestershire sauce. Tartar sauce. Barbecue sauce. Teriyaki sauce. Soy sauce, including reduced-sodium. Steak sauce. Canned and packaged gravies. Fish sauce. Oyster sauce. Cocktail sauce. Horseradish that you find on the shelf. Ketchup. Mustard. Meat flavorings and tenderizers. Bouillon cubes. Hot sauce and Tabasco sauce. Premade or packaged marinades. Premade or packaged taco seasonings. Relishes. Regular salad dressings. Where to find more information:  National Heart, Lung, and [place]: https://wilson-eaton.com/  American Heart Association:  www.heart.org Summary  The DASH eating plan is a healthy eating plan that has been shown to reduce high blood pressure (hypertension). It may also reduce your risk for type 2 diabetes, heart disease, and stroke.  With the DASH eating plan, you should limit salt (sodium) intake to 2,300 mg a day. If you have hypertension, you may need to reduce your sodium intake to 1,500 mg a day.  When on the DASH eating plan, aim to eat more fresh fruits and vegetables, whole grains, lean proteins, low-fat dairy, and heart-healthy fats.  Work with your health care provider or diet and nutrition specialist (dietitian) to adjust your eating plan to your individual calorie needs. This information is not intended to replace advice given to you by your health care provider. Make sure you discuss any questions you have with your health care provider. Document Released: 11/09/2011 Document Revised: 11/13/2016 Document Reviewed: 11/13/2016 Elsevier Interactive Patient Education  2018 [place] for Gastroesophageal Reflux Disease, Adult When you have gastroesophageal reflux disease (GERD), the foods you eat and your eating habits are very important. Choosing the right foods can help ease the discomfort of GERD. Consider working with a diet and nutrition specialist (dietitian) to help you make healthy food choices. What general guidelines should I follow? Eating plan  Choose healthy foods low in fat, such as fruits, vegetables, whole grains, low-fat dairy products, and lean meat, fish, and poultry.  Eat frequent, small meals instead of three large meals each day. Eat your meals slowly, in a relaxed setting. Avoid bending over or lying down until 2-3 hours after eating.  Limit high-fat foods such as fatty meats or fried foods.  Limit your intake of oils, butter, and shortening to less than 8 teaspoons each day.  Avoid the following: ? Foods that cause symptoms. These may be different for  different people. Keep a food diary to keep track of foods that cause symptoms. ? Alcohol. ? Drinking large amounts of liquid with meals. ? Eating meals during the 2-3 hours before bed.  Cook foods using methods other than frying. This may include baking, grilling, or broiling. Lifestyle   Maintain a healthy weight. Ask your health care provider what weight is healthy for you. If you need to lose weight, work with your health care provider to do so safely.  Exercise for at least 30 minutes on 5 or more days each week, or as told by your  health care provider.  Avoid wearing clothes that fit tightly around your waist and chest.  Do not use any products that contain nicotine or tobacco, such as cigarettes and e-cigarettes. If you need help quitting, ask your health care provider.  Sleep with the head of your bed raised. Use a wedge under the mattress or blocks under the bed frame to raise the head of the bed. What foods are not recommended? The items listed may not be a complete list. Talk with your dietitian about what dietary choices are best for you. Grains Pastries or quick breads with added fat. [place] toast. Vegetables Deep fried vegetables. [place] fries. Any vegetables prepared with added fat. Any vegetables that cause symptoms. For some people this may include tomatoes and tomato products, chili peppers, onions and garlic, and horseradish. Fruits Any fruits prepared with added fat. Any fruits that cause symptoms. For some people this may include citrus fruits, such as oranges, grapefruit, pineapple, and lemons. Meats and other protein foods High-fat meats, such as fatty beef or pork, hot dogs, ribs, ham, sausage, salami and bacon. Fried meat or protein, including fried fish and fried chicken. Nuts and nut butters. Dairy Whole milk and chocolate milk. Sour cream. Cream. Ice cream. Cream cheese. Milk shakes. Beverages Coffee and tea, with or without caffeine. Carbonated beverages.  Sodas. Energy drinks. Fruit juice made with acidic fruits (such as orange or grapefruit). Tomato juice. Alcoholic drinks. Fats and oils Butter. Margarine. Shortening. Ghee. Sweets and desserts Chocolate and cocoa. Donuts. Seasoning and other foods Pepper. Peppermint and spearmint. Any condiments, herbs, or seasonings that cause symptoms. For some people, this may include curry, hot sauce, or vinegar-based salad dressings. Summary  When you have gastroesophageal reflux disease (GERD), food and lifestyle choices are very important to help ease the discomfort of GERD.  Eat frequent, small meals instead of three large meals each day. Eat your meals slowly, in a relaxed setting. Avoid bending over or lying down until 2-3 hours after eating.  Limit high-fat foods such as fatty meat or fried foods. This information is not intended to replace advice given to you by your health care provider. Make sure you discuss any questions you have with your health care provider. Document Released: 11/20/2005 Document Revised: 11/21/2016 Document Reviewed: 11/21/2016 Elsevier Interactive Patient Education  [employer].

## 2018-01-24 DIAGNOSIS — J988 Other specified respiratory disorders: Secondary | ICD-10-CM | POA: Diagnosis not present

## 2018-01-24 DIAGNOSIS — R05 Cough: Secondary | ICD-10-CM | POA: Diagnosis not present

## 2018-01-24 DIAGNOSIS — R0602 Shortness of breath: Secondary | ICD-10-CM | POA: Diagnosis not present

## 2018-02-08 DIAGNOSIS — M9901 Segmental and somatic dysfunction of cervical region: Secondary | ICD-10-CM | POA: Diagnosis not present

## 2018-02-08 DIAGNOSIS — M9902 Segmental and somatic dysfunction of thoracic region: Secondary | ICD-10-CM | POA: Diagnosis not present

## 2018-02-08 DIAGNOSIS — M546 Pain in thoracic spine: Secondary | ICD-10-CM | POA: Diagnosis not present

## 2018-02-08 DIAGNOSIS — S335XXA Sprain of ligaments of lumbar spine, initial encounter: Secondary | ICD-10-CM | POA: Diagnosis not present

## 2018-02-08 DIAGNOSIS — M9903 Segmental and somatic dysfunction of lumbar region: Secondary | ICD-10-CM | POA: Diagnosis not present

## 2018-02-08 DIAGNOSIS — M4004 Postural kyphosis, thoracic region: Secondary | ICD-10-CM | POA: Diagnosis not present

## 2018-02-08 DIAGNOSIS — S134XXA Sprain of ligaments of cervical spine, initial encounter: Secondary | ICD-10-CM | POA: Diagnosis not present

## 2018-02-20 ENCOUNTER — Encounter: Payer: Medicare Other | Attending: Family Medicine | Admitting: Nutrition

## 2018-02-20 ENCOUNTER — Encounter: Payer: Self-pay | Admitting: Nutrition

## 2018-02-20 VITALS — Ht 69.0 in | Wt 202.0 lb

## 2018-02-20 DIAGNOSIS — E669 Obesity, unspecified: Secondary | ICD-10-CM | POA: Diagnosis not present

## 2018-02-20 DIAGNOSIS — Z713 Dietary counseling and surveillance: Secondary | ICD-10-CM | POA: Insufficient documentation

## 2018-02-20 DIAGNOSIS — E785 Hyperlipidemia, unspecified: Secondary | ICD-10-CM | POA: Insufficient documentation

## 2018-02-20 DIAGNOSIS — N186 End stage renal disease: Secondary | ICD-10-CM

## 2018-02-20 DIAGNOSIS — Z6829 Body mass index (BMI) 29.0-29.9, adult: Secondary | ICD-10-CM | POA: Diagnosis not present

## 2018-02-20 DIAGNOSIS — I129 Hypertensive chronic kidney disease with stage 1 through stage 4 chronic kidney disease, or unspecified chronic kidney disease: Secondary | ICD-10-CM | POA: Diagnosis not present

## 2018-02-20 DIAGNOSIS — E1122 Type 2 diabetes mellitus with diabetic chronic kidney disease: Secondary | ICD-10-CM | POA: Diagnosis not present

## 2018-02-20 DIAGNOSIS — N183 Chronic kidney disease, stage 3 (moderate): Secondary | ICD-10-CM | POA: Insufficient documentation

## 2018-02-20 NOTE — Patient Instructions (Signed)
Goals 1. Increase fresh fruit and vegetables 2. Increase walking 35 minutes 4 times per week. 3. Drink only water

## 2018-02-20 NOTE — Progress Notes (Signed)
  Medical Nutrition Therapy:  Appt start time: 0930end time:  1000  Assessment:  Primary concerns today: CKD-Stg 3, HTN, Obesity, DM Type 2, HTN and Hyperlipidemia.  Last A1C 5.6% 4 months ago. Has been sick with a sinus infection for 6 weeks.  Hasn't been able to walk since being sick. Lost 9 lbs since Dec 2019.  Has been making better choices with meals.  Sees CKD Md Dr. Glean Hess soon.  Needs to eat more lower carb vegetables with lunch and dinner and get back to walking when he feels better.  Last labs reported show slighy improvement in Creatine and g GFR. Potassium levels WNL.  Advised him to touch with with Dr. Detterding about if he needs a Phos restriction.  Preferred Learning Style:   No preference indicated   Learning Readiness:   Ready  Change in progress   MEDICATIONS: See list   DIETARY INTAKE:  24-hr recall:  B ( AM): Boiiled egg, 2 slice toast, water.  crystal light Snk ( AM):   L ( PM): Salmon cakes-fried, bread, 2 slices, water Snk ( PM): D ( PM): skipped last night: Tomato or bologna sandwich, water Snk ( PM): Beverages: Water, crystal lights  Usual physical activity: ADL  Estimated energy needs: 1800  calories 200 g carbohydrates 135 g protein 50 g fat  Progress Towards Goal(s):  In progress.   Nutritional Diagnosis:  NB-1.1 Food and nutrition-related knowledge deficit As related to CKD, DM and HTN  As evidenced by Stg 3 CKD, CHol > 200 and BMI > 30.    Intervention:  Nutrition and Diabetes education provided on My Plate, CHO counting, meal planning, portion sizes, timing of meals, avoiding snacks between meals unless having a low blood sugar, target ranges for A1C and blood sugars, signs/symptoms and treatment of hyper/hypoglycemia, monitoring blood sugars, taking medications as prescribed, benefits of exercising 30 minutes per day and prevention of complications of DM. Low Fat Low Sodium, Diet for CKD Stg 3, High FIber  Teaching Method  Utilized:  Visual Auditory Hands on  Handouts given during visit include:  The Plate Method   Meal Plan Card  Low Sodium  Nutrition for CKD  Barriers to learning/adherence to lifestyle change: none  Demonstrated degree of understanding via:  Teach Back   Monitoring/Evaluation:  Dietary intake, exercise, meal planning, SBG, and body weight in 4 month(s).

## 2018-03-08 DIAGNOSIS — M9903 Segmental and somatic dysfunction of lumbar region: Secondary | ICD-10-CM | POA: Diagnosis not present

## 2018-03-08 DIAGNOSIS — M9901 Segmental and somatic dysfunction of cervical region: Secondary | ICD-10-CM | POA: Diagnosis not present

## 2018-03-08 DIAGNOSIS — M9902 Segmental and somatic dysfunction of thoracic region: Secondary | ICD-10-CM | POA: Diagnosis not present

## 2018-03-08 DIAGNOSIS — M4004 Postural kyphosis, thoracic region: Secondary | ICD-10-CM | POA: Diagnosis not present

## 2018-03-08 DIAGNOSIS — M546 Pain in thoracic spine: Secondary | ICD-10-CM | POA: Diagnosis not present

## 2018-03-08 DIAGNOSIS — S335XXA Sprain of ligaments of lumbar spine, initial encounter: Secondary | ICD-10-CM | POA: Diagnosis not present

## 2018-03-08 DIAGNOSIS — S134XXA Sprain of ligaments of cervical spine, initial encounter: Secondary | ICD-10-CM | POA: Diagnosis not present

## 2018-03-16 ENCOUNTER — Other Ambulatory Visit: Payer: Self-pay | Admitting: Gastroenterology

## 2018-03-25 DIAGNOSIS — M546 Pain in thoracic spine: Secondary | ICD-10-CM | POA: Diagnosis not present

## 2018-03-25 DIAGNOSIS — M9902 Segmental and somatic dysfunction of thoracic region: Secondary | ICD-10-CM | POA: Diagnosis not present

## 2018-03-25 DIAGNOSIS — M9903 Segmental and somatic dysfunction of lumbar region: Secondary | ICD-10-CM | POA: Diagnosis not present

## 2018-03-25 DIAGNOSIS — S335XXA Sprain of ligaments of lumbar spine, initial encounter: Secondary | ICD-10-CM | POA: Diagnosis not present

## 2018-03-25 DIAGNOSIS — S134XXA Sprain of ligaments of cervical spine, initial encounter: Secondary | ICD-10-CM | POA: Diagnosis not present

## 2018-03-25 DIAGNOSIS — M4004 Postural kyphosis, thoracic region: Secondary | ICD-10-CM | POA: Diagnosis not present

## 2018-03-25 DIAGNOSIS — M9901 Segmental and somatic dysfunction of cervical region: Secondary | ICD-10-CM | POA: Diagnosis not present

## 2018-03-27 ENCOUNTER — Ambulatory Visit: Payer: Medicare Other | Admitting: Nutrition

## 2018-04-09 DIAGNOSIS — Z87442 Personal history of urinary calculi: Secondary | ICD-10-CM | POA: Diagnosis not present

## 2018-04-09 DIAGNOSIS — N2581 Secondary hyperparathyroidism of renal origin: Secondary | ICD-10-CM | POA: Diagnosis not present

## 2018-04-09 DIAGNOSIS — Z9989 Dependence on other enabling machines and devices: Secondary | ICD-10-CM | POA: Diagnosis not present

## 2018-04-09 DIAGNOSIS — E118 Type 2 diabetes mellitus with unspecified complications: Secondary | ICD-10-CM | POA: Diagnosis not present

## 2018-04-09 DIAGNOSIS — E669 Obesity, unspecified: Secondary | ICD-10-CM | POA: Diagnosis not present

## 2018-04-09 DIAGNOSIS — N184 Chronic kidney disease, stage 4 (severe): Secondary | ICD-10-CM | POA: Diagnosis not present

## 2018-04-09 DIAGNOSIS — I129 Hypertensive chronic kidney disease with stage 1 through stage 4 chronic kidney disease, or unspecified chronic kidney disease: Secondary | ICD-10-CM | POA: Diagnosis not present

## 2018-04-09 DIAGNOSIS — Z8739 Personal history of other diseases of the musculoskeletal system and connective tissue: Secondary | ICD-10-CM | POA: Diagnosis not present

## 2018-04-09 DIAGNOSIS — G4733 Obstructive sleep apnea (adult) (pediatric): Secondary | ICD-10-CM | POA: Diagnosis not present

## 2018-04-09 DIAGNOSIS — M1990 Unspecified osteoarthritis, unspecified site: Secondary | ICD-10-CM | POA: Diagnosis not present

## 2018-04-09 DIAGNOSIS — E1122 Type 2 diabetes mellitus with diabetic chronic kidney disease: Secondary | ICD-10-CM | POA: Diagnosis not present

## 2018-04-09 DIAGNOSIS — D631 Anemia in chronic kidney disease: Secondary | ICD-10-CM | POA: Diagnosis not present

## 2018-04-09 LAB — BASIC METABOLIC PANEL
BUN: 31 — AB (ref 4–21)
Creatinine: 3 — AB (ref 0.6–1.3)
Glucose: 132
POTASSIUM: 4 (ref 3.4–5.3)
SODIUM: 128 — AB (ref 137–147)

## 2018-04-09 LAB — HEPATIC FUNCTION PANEL
ALK PHOS: 85 (ref 25–125)
ALT: 10 (ref 10–40)
AST: 13 — AB (ref 14–40)
BILIRUBIN, TOTAL: 0.3

## 2018-04-18 ENCOUNTER — Encounter: Payer: Self-pay | Admitting: *Deleted

## 2018-04-18 LAB — CALCIUM: Calcium: 10.4

## 2018-04-30 DIAGNOSIS — H25013 Cortical age-related cataract, bilateral: Secondary | ICD-10-CM | POA: Diagnosis not present

## 2018-04-30 DIAGNOSIS — H2513 Age-related nuclear cataract, bilateral: Secondary | ICD-10-CM | POA: Diagnosis not present

## 2018-05-04 HISTORY — PX: EYE SURGERY: SHX253

## 2018-05-10 DIAGNOSIS — M9901 Segmental and somatic dysfunction of cervical region: Secondary | ICD-10-CM | POA: Diagnosis not present

## 2018-05-10 DIAGNOSIS — M4004 Postural kyphosis, thoracic region: Secondary | ICD-10-CM | POA: Diagnosis not present

## 2018-05-10 DIAGNOSIS — S134XXA Sprain of ligaments of cervical spine, initial encounter: Secondary | ICD-10-CM | POA: Diagnosis not present

## 2018-05-10 DIAGNOSIS — M9902 Segmental and somatic dysfunction of thoracic region: Secondary | ICD-10-CM | POA: Diagnosis not present

## 2018-05-10 DIAGNOSIS — M546 Pain in thoracic spine: Secondary | ICD-10-CM | POA: Diagnosis not present

## 2018-05-10 DIAGNOSIS — M9903 Segmental and somatic dysfunction of lumbar region: Secondary | ICD-10-CM | POA: Diagnosis not present

## 2018-05-10 DIAGNOSIS — S335XXA Sprain of ligaments of lumbar spine, initial encounter: Secondary | ICD-10-CM | POA: Diagnosis not present

## 2018-05-15 DIAGNOSIS — H25013 Cortical age-related cataract, bilateral: Secondary | ICD-10-CM | POA: Diagnosis not present

## 2018-05-15 DIAGNOSIS — H2513 Age-related nuclear cataract, bilateral: Secondary | ICD-10-CM | POA: Diagnosis not present

## 2018-05-29 DIAGNOSIS — H2511 Age-related nuclear cataract, right eye: Secondary | ICD-10-CM | POA: Diagnosis not present

## 2018-05-29 DIAGNOSIS — H25011 Cortical age-related cataract, right eye: Secondary | ICD-10-CM | POA: Diagnosis not present

## 2018-05-29 DIAGNOSIS — H25012 Cortical age-related cataract, left eye: Secondary | ICD-10-CM | POA: Diagnosis not present

## 2018-05-29 DIAGNOSIS — H2512 Age-related nuclear cataract, left eye: Secondary | ICD-10-CM | POA: Diagnosis not present

## 2018-06-05 DIAGNOSIS — H25011 Cortical age-related cataract, right eye: Secondary | ICD-10-CM | POA: Diagnosis not present

## 2018-06-05 DIAGNOSIS — H2511 Age-related nuclear cataract, right eye: Secondary | ICD-10-CM | POA: Diagnosis not present

## 2018-06-07 DIAGNOSIS — M9903 Segmental and somatic dysfunction of lumbar region: Secondary | ICD-10-CM | POA: Diagnosis not present

## 2018-06-07 DIAGNOSIS — S134XXA Sprain of ligaments of cervical spine, initial encounter: Secondary | ICD-10-CM | POA: Diagnosis not present

## 2018-06-07 DIAGNOSIS — M9901 Segmental and somatic dysfunction of cervical region: Secondary | ICD-10-CM | POA: Diagnosis not present

## 2018-06-07 DIAGNOSIS — M9902 Segmental and somatic dysfunction of thoracic region: Secondary | ICD-10-CM | POA: Diagnosis not present

## 2018-06-07 DIAGNOSIS — M546 Pain in thoracic spine: Secondary | ICD-10-CM | POA: Diagnosis not present

## 2018-06-07 DIAGNOSIS — S335XXA Sprain of ligaments of lumbar spine, initial encounter: Secondary | ICD-10-CM | POA: Diagnosis not present

## 2018-06-07 DIAGNOSIS — M4004 Postural kyphosis, thoracic region: Secondary | ICD-10-CM | POA: Diagnosis not present

## 2018-06-17 ENCOUNTER — Encounter: Payer: Medicare Other | Attending: Family Medicine | Admitting: Nutrition

## 2018-06-17 ENCOUNTER — Encounter: Payer: Self-pay | Admitting: Nutrition

## 2018-06-17 VITALS — Ht 70.0 in | Wt 210.0 lb

## 2018-06-17 DIAGNOSIS — E1122 Type 2 diabetes mellitus with diabetic chronic kidney disease: Secondary | ICD-10-CM | POA: Diagnosis not present

## 2018-06-17 DIAGNOSIS — Z713 Dietary counseling and surveillance: Secondary | ICD-10-CM | POA: Insufficient documentation

## 2018-06-17 DIAGNOSIS — N184 Chronic kidney disease, stage 4 (severe): Secondary | ICD-10-CM | POA: Diagnosis not present

## 2018-06-17 DIAGNOSIS — E669 Obesity, unspecified: Secondary | ICD-10-CM | POA: Diagnosis not present

## 2018-06-17 DIAGNOSIS — N183 Chronic kidney disease, stage 3 unspecified: Secondary | ICD-10-CM

## 2018-06-17 DIAGNOSIS — I129 Hypertensive chronic kidney disease with stage 1 through stage 4 chronic kidney disease, or unspecified chronic kidney disease: Secondary | ICD-10-CM | POA: Insufficient documentation

## 2018-06-17 DIAGNOSIS — R739 Hyperglycemia, unspecified: Secondary | ICD-10-CM

## 2018-06-17 NOTE — Patient Instructions (Addendum)
Goals 1. Follow MY Plate-increase low carb vegetables. 2.  Don't skip meals and don't eat past 7 pm 3. Avoid processed foods 4. Exercise 30 minutes daily Lose 1 lb per week Test blood sugars in am and before bed

## 2018-06-17 NOTE — Progress Notes (Signed)
  Medical Nutrition Therapy:  Appt start time: 0930end time:  1000  Assessment:  Primary concerns today: CKD-Stg 3-4? unavailable eGFR,  HTN, Obesity, DM Type 2, HTN and Hyperlipidemia.  Admits to falling off the wagon these last few months. . Has just joined  [employer] and will get back to walking. He admits to not exercising and eating foods he was avoiding. FBS 181 mg/dl the other morning. BS was 131 mg/dl this am. Not on any DM meds due to CKD. Was taken off Dexilant awhle back andnow on Famolidine..   Complains of his feet hurting at times-numbness and tingling. H/o of GOUT but he says the pain comes and goes, not like GOUT. Michele Rockers back 8 lbs due to not exercising and excessive calorie intake. Sees CKD MD in August. Saw him in June and was told his kidneys were 'stable.' He is motivated to get back on track with making better food choices and exercise.. May labs: Creat 3.0,-unavaiable eGFR Glu 132 mg/dl. Na 128 mg/dl.  Preferred Learning Style:   No preference indicated   Learning Readiness:   Ready  Change in progress   MEDICATIONS: See list   DIETARY INTAKE:  24-hr recall:  B ( AM): boiiled egg and 2 slices toast, water.  Snk ( AM):   L ( PM): skipped yesterday;  Snk ( PM): D ( PM): Lasagna and sweet tea, Snk ( PM): Beverages: Water, crystal lights  Usual physical activity: ADL  Estimated energy needs: 1800  calories 200 g carbohydrates 135 g protein 50 g fat  Progress Towards Goal(s):  In progress.   Nutritional Diagnosis:  NB-1.1 Food and nutrition-related knowledge deficit As related to CKD, DM and HTN  As evidenced by Stg 3 CKD, CHol > 200 and BMI > 30.    Intervention:  Nutrition and Diabetes education provided on My Plate, CHO counting, meal planning, portion sizes, timing of meals, avoiding snacks between meals unless having a low blood sugar, target ranges for A1C and blood sugars, signs/symptoms and treatment of hyper/hypoglycemia, monitoring blood  sugars, taking medications as prescribed, benefits of exercising 30 minutes per day and prevention of complications of DM. Low Fat Low Sodium, Diet for CKD Stg 3, High FIber   Goals 1. Follow MY Plate-increase low carb vegetables. 2.  Don't skip meals and don't eat past 7 pm 3. Avoid processed foods and high salt foods. 4. Exercise 30 minutes daily Lose 1 lb per week Test blood sugars in am and before bed   Teaching Method Utilized:  Visual Auditory Hands on  Handouts given during visit include:  The Plate Method   Meal Plan Card  Low Sodium  Nutrition for CKD  Barriers to learning/adherence to lifestyle change: none  Demonstrated degree of understanding via:  Teach Back   Monitoring/Evaluation:  Dietary intake, exercise, meal planning, SBG, and body weight in 2  Month(s). Need confirmation with Dr. Esau Grew if pt would benefit from Potassium or Phosphorus restriction in addition to low sodium diet.

## 2018-07-10 DIAGNOSIS — Z87442 Personal history of urinary calculi: Secondary | ICD-10-CM | POA: Diagnosis not present

## 2018-07-10 DIAGNOSIS — Z0000 Encounter for general adult medical examination without abnormal findings: Secondary | ICD-10-CM | POA: Diagnosis not present

## 2018-07-10 DIAGNOSIS — Z9989 Dependence on other enabling machines and devices: Secondary | ICD-10-CM | POA: Diagnosis not present

## 2018-07-10 DIAGNOSIS — D631 Anemia in chronic kidney disease: Secondary | ICD-10-CM | POA: Diagnosis not present

## 2018-07-10 DIAGNOSIS — I129 Hypertensive chronic kidney disease with stage 1 through stage 4 chronic kidney disease, or unspecified chronic kidney disease: Secondary | ICD-10-CM | POA: Diagnosis not present

## 2018-07-10 DIAGNOSIS — E1122 Type 2 diabetes mellitus with diabetic chronic kidney disease: Secondary | ICD-10-CM | POA: Diagnosis not present

## 2018-07-10 DIAGNOSIS — N189 Chronic kidney disease, unspecified: Secondary | ICD-10-CM | POA: Diagnosis not present

## 2018-07-10 DIAGNOSIS — N2581 Secondary hyperparathyroidism of renal origin: Secondary | ICD-10-CM | POA: Diagnosis not present

## 2018-07-10 DIAGNOSIS — G4733 Obstructive sleep apnea (adult) (pediatric): Secondary | ICD-10-CM | POA: Diagnosis not present

## 2018-07-10 DIAGNOSIS — Z8739 Personal history of other diseases of the musculoskeletal system and connective tissue: Secondary | ICD-10-CM | POA: Diagnosis not present

## 2018-07-10 DIAGNOSIS — E669 Obesity, unspecified: Secondary | ICD-10-CM | POA: Diagnosis not present

## 2018-07-10 DIAGNOSIS — N184 Chronic kidney disease, stage 4 (severe): Secondary | ICD-10-CM | POA: Diagnosis not present

## 2018-07-10 DIAGNOSIS — M1990 Unspecified osteoarthritis, unspecified site: Secondary | ICD-10-CM | POA: Diagnosis not present

## 2018-08-09 ENCOUNTER — Other Ambulatory Visit: Payer: Self-pay | Admitting: Internal Medicine

## 2018-08-09 DIAGNOSIS — Z23 Encounter for immunization: Secondary | ICD-10-CM | POA: Diagnosis not present

## 2018-08-12 DIAGNOSIS — M25579 Pain in unspecified ankle and joints of unspecified foot: Secondary | ICD-10-CM | POA: Diagnosis not present

## 2018-08-12 DIAGNOSIS — M79671 Pain in right foot: Secondary | ICD-10-CM | POA: Diagnosis not present

## 2018-08-12 NOTE — Telephone Encounter (Signed)
Dr. Annamaria Boots, please advise if it is okay to refill pt's ambien.  Allergies  Allergen Reactions  . Dye Fdc Blue [Brilliant Blue Fcf (Fd&C Blue #1)]     PT UNSURE WHICH DYE  . Iodinated Diagnostic Agents Hives    30 YEARS AGO     Current Outpatient Medications:  .  acetaminophen (TYLENOL) 500 MG tablet, Take 1,000 mg by mouth 2 (two) times daily as needed for headache., Disp: , Rfl:  .  allopurinol (ZYLOPRIM) 100 MG tablet, Take 300 mg by mouth daily. , Disp: , Rfl:  .  amLODipine (NORVASC) 10 MG tablet, Take 10 mg by mouth daily., Disp: , Rfl:  .  calcitRIOL (ROCALTROL) 0.25 MCG capsule, Take 0.25 mcg by mouth at bedtime. , Disp: , Rfl:  .  clomiPHENE (CLOMID) 50 MG tablet, Take 25 mg by mouth at bedtime. , Disp: , Rfl:  .  famciclovir (FAMVIR) 500 MG tablet, Take 1,000 mg by mouth 2 (two) times daily as needed (fever blisters). , Disp: , Rfl: 5 .  famotidine (PEPCID) 40 MG tablet, TAKE 1 TABLET (40 MG TOTAL) BY MOUTH DAILY. WITH BREAKFAST, Disp: 30 tablet, Rfl: 11 .  felodipine (PLENDIL) 5 MG 24 hr tablet, Take 5 mg by mouth at bedtime. , Disp: , Rfl:  .  fluticasone (FLONASE) 50 MCG/ACT nasal spray, Place 1 spray into both nostrils daily as needed for allergies or rhinitis., Disp: , Rfl:  .  furosemide (LASIX) 40 MG tablet, Take 40 mg by mouth., Disp: , Rfl:  .  propranolol (INDERAL) 80 MG tablet, Take 80 mg by mouth 2 (two) times daily., Disp: , Rfl: 1 .  psyllium (METAMUCIL) 58.6 % powder, Take 1 packet by mouth daily. As needed for constipation, Disp: , Rfl:  .  rosuvastatin (CRESTOR) 5 MG tablet, Take 1 tablet (5 mg total) by mouth daily., Disp: 30 tablet, Rfl: 12 .  vitamin C (ASCORBIC ACID) 500 MG tablet, Take 500 mg by mouth 2 (two) times a week. , Disp: , Rfl:  .  zolpidem (AMBIEN) 5 MG tablet, 1 at bedtime for sleep as needed (Patient taking differently: Take 5 mg by mouth at bedtime as needed for sleep. ), Disp: 30 tablet, Rfl: 5

## 2018-08-13 NOTE — Telephone Encounter (Signed)
Medication has been called into pharmacy for patient.

## 2018-08-13 NOTE — Telephone Encounter (Signed)
Ok to refill total 6 months 

## 2018-08-19 DIAGNOSIS — E291 Testicular hypofunction: Secondary | ICD-10-CM | POA: Diagnosis not present

## 2018-08-19 DIAGNOSIS — N401 Enlarged prostate with lower urinary tract symptoms: Secondary | ICD-10-CM | POA: Diagnosis not present

## 2018-08-21 ENCOUNTER — Ambulatory Visit: Payer: Medicare Other | Admitting: Nutrition

## 2018-08-23 DIAGNOSIS — M4004 Postural kyphosis, thoracic region: Secondary | ICD-10-CM | POA: Diagnosis not present

## 2018-08-23 DIAGNOSIS — M9903 Segmental and somatic dysfunction of lumbar region: Secondary | ICD-10-CM | POA: Diagnosis not present

## 2018-08-23 DIAGNOSIS — M546 Pain in thoracic spine: Secondary | ICD-10-CM | POA: Diagnosis not present

## 2018-08-23 DIAGNOSIS — S134XXA Sprain of ligaments of cervical spine, initial encounter: Secondary | ICD-10-CM | POA: Diagnosis not present

## 2018-08-23 DIAGNOSIS — S335XXA Sprain of ligaments of lumbar spine, initial encounter: Secondary | ICD-10-CM | POA: Diagnosis not present

## 2018-08-23 DIAGNOSIS — M9901 Segmental and somatic dysfunction of cervical region: Secondary | ICD-10-CM | POA: Diagnosis not present

## 2018-08-23 DIAGNOSIS — M9902 Segmental and somatic dysfunction of thoracic region: Secondary | ICD-10-CM | POA: Diagnosis not present

## 2018-08-26 DIAGNOSIS — N5201 Erectile dysfunction due to arterial insufficiency: Secondary | ICD-10-CM | POA: Diagnosis not present

## 2018-08-26 DIAGNOSIS — R3915 Urgency of urination: Secondary | ICD-10-CM | POA: Diagnosis not present

## 2018-08-26 DIAGNOSIS — E291 Testicular hypofunction: Secondary | ICD-10-CM | POA: Diagnosis not present

## 2018-08-29 DIAGNOSIS — R509 Fever, unspecified: Secondary | ICD-10-CM | POA: Diagnosis not present

## 2018-08-29 DIAGNOSIS — J029 Acute pharyngitis, unspecified: Secondary | ICD-10-CM | POA: Diagnosis not present

## 2018-08-29 DIAGNOSIS — J329 Chronic sinusitis, unspecified: Secondary | ICD-10-CM | POA: Diagnosis not present

## 2018-08-29 DIAGNOSIS — B9689 Other specified bacterial agents as the cause of diseases classified elsewhere: Secondary | ICD-10-CM | POA: Diagnosis not present

## 2018-09-11 ENCOUNTER — Encounter: Payer: Self-pay | Admitting: Nutrition

## 2018-09-11 ENCOUNTER — Encounter: Payer: Medicare Other | Attending: Family Medicine | Admitting: Nutrition

## 2018-09-11 VITALS — Ht 69.0 in | Wt 200.0 lb

## 2018-09-11 DIAGNOSIS — N189 Chronic kidney disease, unspecified: Secondary | ICD-10-CM | POA: Diagnosis not present

## 2018-09-11 DIAGNOSIS — E119 Type 2 diabetes mellitus without complications: Secondary | ICD-10-CM

## 2018-09-11 DIAGNOSIS — E1122 Type 2 diabetes mellitus with diabetic chronic kidney disease: Secondary | ICD-10-CM | POA: Diagnosis not present

## 2018-09-11 DIAGNOSIS — I129 Hypertensive chronic kidney disease with stage 1 through stage 4 chronic kidney disease, or unspecified chronic kidney disease: Secondary | ICD-10-CM | POA: Diagnosis not present

## 2018-09-11 DIAGNOSIS — E669 Obesity, unspecified: Secondary | ICD-10-CM | POA: Insufficient documentation

## 2018-09-11 DIAGNOSIS — Z713 Dietary counseling and surveillance: Secondary | ICD-10-CM | POA: Insufficient documentation

## 2018-09-11 DIAGNOSIS — N184 Chronic kidney disease, stage 4 (severe): Secondary | ICD-10-CM

## 2018-09-11 NOTE — Patient Instructions (Addendum)
Goals  Keep up the great job! 1. Go to [facility] three times per week. 2. Start eating lunch an don't skip meals.

## 2018-09-11 NOTE — Progress Notes (Signed)
  Medical Nutrition Therapy:  Appt start time: 0915 end time:  930 Assessment:  Primary concerns today: CKD-Stg 3-4 unavailable eGFR,  HTN, Obesity, DM Type 2, HTN and Hyperlipidemia. Saw Dr. Leeroy Bock office in August. FBS 100-115 mg/dl. Just diet controlled. A1C 5.7-5.9% .PCP: Bruce Weber. Changes made:  Has cut out crystal light and drinking more water. Doesn't eat any processed foods except on occasion now. Eating most meals at home.  May consider retiring soon and that will give him more time to exercise he reports. Joined the [facility] and will plan on going 3 times per week. Saw Dr. Berline Lopes, podiatrist.  Kidney function to be stable. eGFR 24-26 8/19.   Preferred Learning Style:   No preference indicated   Learning Readiness:   Ready  Change in progress   MEDICATIONS: See list   DIETARY INTAKE:  24-hr recall:  B ( AM): boiiled egg and 2 slices toast, water.  Snk ( AM):   L ( PM): Skips,  Or sandwich. water Snk ( PM): D ( PM): : [place] sandwich, water or meat and vegetables.  Beverages: Water,  Usual physical activity: walks  Estimated energy needs: 1800  calories 200 g carbohydrates 135 g protein 50 g fat  Progress Towards Goal(s):  In progress.   Nutritional Diagnosis:  NB-1.1 Food and nutrition-related knowledge deficit As related to CKD, DM and HTN  As evidenced by Stg 3 CKD, CHol > 200 and BMI > 30.    Intervention:  Nutrition and Diabetes education provided on My Plate, CHO counting, meal planning, portion sizes, timing of meals, avoiding snacks between meals unless having a low blood sugar, target ranges for A1C and blood sugars, signs/symptoms and treatment of hyper/hypoglycemia, monitoring blood sugars, taking medications as prescribed, benefits of exercising 30 minutes per day and prevention of complications of DM. Low Fat Low Sodium, Diet for CKD Stg 3, High FIber   Goals  Keep up the great job! 1. Go to [facility] three times per week. 2. Start eating lunch an  don't skip meals. Continue to limit potassium and high phosphorus foods.  Teaching Method Utilized:  Visual Auditory Hands on  Handouts given during visit include:  The Plate Method   Meal Plan Card  Low Sodium  Nutrition for CKD  Barriers to learning/adherence to lifestyle change: none  Demonstrated degree of understanding via:  Teach Back   Monitoring/Evaluation:  Dietary intake, exercise, meal planning, SBG, and body weight in 2  Month(s).

## 2018-09-20 ENCOUNTER — Telehealth: Payer: Self-pay | Admitting: *Deleted

## 2018-09-20 NOTE — Telephone Encounter (Signed)
Copied from [place] [phone]. Topic: Medicare AWV >> Sep 20, 2018 10:45 AM Bea Graff, NT wrote: Reason for CRM: Pt would like a call to schedule his AWV.

## 2018-09-24 NOTE — Telephone Encounter (Signed)
Call to Bruce Weber and he did schedule an apt for office visit Educated regarding AWV and he will schedule this after he is seen on Friday by Dr. Volanda Napoleon  Note added to apt as a reminder.   Wynetta Fines RN

## 2018-09-26 ENCOUNTER — Encounter: Payer: Self-pay | Admitting: Family Medicine

## 2018-09-26 ENCOUNTER — Ambulatory Visit (INDEPENDENT_AMBULATORY_CARE_PROVIDER_SITE_OTHER): Payer: Medicare Other | Admitting: Family Medicine

## 2018-09-26 VITALS — BP 148/82 | HR 74 | Temp 98.2°F | Wt 207.0 lb

## 2018-09-26 DIAGNOSIS — E782 Mixed hyperlipidemia: Secondary | ICD-10-CM | POA: Diagnosis not present

## 2018-09-26 DIAGNOSIS — R252 Cramp and spasm: Secondary | ICD-10-CM | POA: Diagnosis not present

## 2018-09-26 DIAGNOSIS — I10 Essential (primary) hypertension: Secondary | ICD-10-CM

## 2018-09-26 MED ORDER — PROPRANOLOL HCL 80 MG PO TABS: 80.0000 mg | ORAL_TABLET | Freq: Two times a day (BID) | ORAL | 3 refills | Status: DC

## 2018-09-26 NOTE — Patient Instructions (Signed)
Muscle Cramps and Spasms Muscle cramps and spasms occur when a muscle or muscles tighten and you have no control over this tightening (involuntary muscle contraction). They are a common problem and can develop in any muscle. The most common place is in the calf muscles of the leg. Muscle cramps and muscle spasms are both involuntary muscle contractions, but there are some differences between the two:  Muscle cramps are painful. They come and go and may last a few seconds to 15 minutes. Muscle cramps are often more forceful and last longer than muscle spasms.  Muscle spasms may or may not be painful. They may also last just a few seconds or much longer.  Certain medical conditions, such as diabetes or Parkinson disease, can make it more likely to develop cramps or spasms. However, cramps or spasms are usually not caused by a serious underlying problem. Common causes include:  Overexertion.  Overuse from repetitive motions, or doing the same thing over and over.  Remaining in a certain position for a long period of time.  Improper preparation, form, or technique while playing a sport or doing an activity.  Dehydration.  Injury.  Side effects of some medicines.  Abnormally low levels of the salts and ions in your blood (electrolytes), especially potassium and calcium. This could happen if you are taking water pills (diuretics) or if you are pregnant.  In many cases, the cause of muscle cramps or spasms is unknown. Follow these instructions at home:  Stay well hydrated. Drink enough fluid to keep your urine clear or pale yellow.  Try massaging, stretching, and relaxing the affected muscle.  If directed, apply heat to tight or tense muscles as often as told by your health care provider. Use the heat source that your health care provider recommends, such as a moist heat pack or a heating pad. ? Place a towel between your skin and the heat source. ? Leave the heat on for 20-30  minutes. ? Remove the heat if your skin turns bright red. This is especially important if you are unable to feel pain, heat, or cold. You may have a greater risk of getting burned.  If directed, put ice on the affected area. This may help if you are sore or have pain after a cramp or spasm. ? Put ice in a plastic bag. ? Place a towel between your skin and the bag. ? Leavethe ice on for 20 minutes, 2-3 times a day.  Take over-the-counter and prescription medicines only as told by your health care provider.  Pay attention to any changes in your symptoms. Contact a health care provider if:  Your cramps or spasms get more severe or happen more often.  Your cramps or spasms do not improve over time. This information is not intended to replace advice given to you by your health care provider. Make sure you discuss any questions you have with your health care provider. Document Released: 05/12/2002 Document Revised: 12/22/2015 Document Reviewed: 08/24/2015 Elsevier Interactive Patient Education  2018 Elsevier Inc.  

## 2018-09-26 NOTE — Progress Notes (Signed)
Subjective:    Patient ID: Bruce Gerold., male    DOB: 04-25-1951, 67 y.o.   MRN: [id number]  No chief complaint on file.   HPI Patient was seen today for acute concern.  Pt endorses leg cramps x several days.  Pt takes lasix 40 mg daily.  Followed by Nephrology for CKD, typically has labs drawn every 3 months.  Next labs due November 18.  Pt requesting refill on propranolol.  Has been out times a few months.  Pt was checking BP daily and exercising but has not done so since June.  Pt taking rosuvastatin 5 mg daily.  No recent cholesterol check.  Pt had a pork chop biscuit with gravy and a sausage biscuit for breakfast this morning.  Working with a Automotive engineer.  Past Medical History:  Diagnosis Date  . Adenoma 11/2009   1.1 cm from TCS  . DM (diabetes mellitus) ([place])   . GERD (gastroesophageal reflux disease)   . High cholesterol   . HTN (hypertension)   . Kidney stones   . Obesity (BMI 30-39.9) DEC 2011 223 LBS  . Rectal bleeding 2011   secondary to hemorrhoids    Allergies  Allergen Reactions  . Dye Fdc Blue [Brilliant Blue Fcf (Fd&C Blue #1)]     PT UNSURE WHICH DYE  . Iodinated Diagnostic Agents Hives    30 YEARS AGO    ROS General: Denies fever, chills, night sweats, changes in weight, changes in appetite HEENT: Denies headaches, ear pain, changes in vision, rhinorrhea, sore throat CV: Denies CP, palpitations, SOB, orthopnea Pulm: Denies SOB, cough, wheezing GI: Denies abdominal pain, nausea, vomiting, diarrhea, constipation GU: Denies dysuria, hematuria, frequency, vaginal discharge Msk: Denies joint pains  +muscle cramps Neuro: Denies weakness, numbness, tingling Skin: Denies rashes, bruising Psych: Denies depression, anxiety, hallucinations      Objective:    Blood pressure (!) 148/82, pulse 74, temperature 98.2 F (36.8 C), temperature source Oral, weight 207 lb (93.9 kg), SpO2 98 %.  Gen. Pleasant, well-nourished, in no distress, normal affect    Lungs: no accessory muscle use Cardiovascular: RRR, no peripheral edema Musculoskeletal: No deformities, no cyanosis or clubbing, normal tone Neuro:  A&Ox3, CN II-XII intact, normal gait    Wt Readings from Last 3 Encounters:  09/26/18 207 lb (93.9 kg)  09/11/18 200 lb (90.7 kg)  06/17/18 210 lb (95.3 kg)    Lab Results  Component Value Date   ALT 10 04/09/2018   AST 13 (A) 04/09/2018   NA 128 (A) 04/09/2018   K 4.0 04/09/2018   CREATININE 3.0 (A) 04/09/2018   BUN 31 (A) 04/09/2018    Assessment/Plan:  Muscle cramps  -Discussed drinking plenty of water, stretching daily. -Consider trying yellow mustard or tumor for muscle cramping -We will obtain BMP -If needed will send in Rx for potassium supplements - Plan: Basic metabolic panel  Essential hypertension  -Elevated -Restart checking BP at home and keeping a log to bring with you to clinic -Continue Norvasc 10 mg daily, felodipine 5 mg, Lasix 40 mg, propanolol 80 mg twice daily. - Plan: propranolol (INDERAL) 80 MG tablet  HLD -Will place future lab order for lipid panel.  Patient to return to clinic when fasting or have labs drawn at next [facility] with nephrology.  Follow-up PRN in the next few months  Grier Mitts, MD

## 2018-09-27 LAB — BASIC METABOLIC PANEL
BUN: 27 mg/dL — AB (ref 6–23)
CHLORIDE: 104 meq/L (ref 96–112)
CO2: 23 mEq/L (ref 19–32)
CREATININE: 2.96 mg/dL — AB (ref 0.40–1.50)
Calcium: 9 mg/dL (ref 8.4–10.5)
GFR: 27.22 mL/min — AB (ref >60.00)
GLUCOSE: 74 mg/dL (ref 70–99)
POTASSIUM: 4 meq/L (ref 3.5–5.1)
Sodium: 139 mEq/L (ref 135–145)

## 2018-10-14 DIAGNOSIS — Z961 Presence of intraocular lens: Secondary | ICD-10-CM | POA: Diagnosis not present

## 2018-10-22 DIAGNOSIS — G4733 Obstructive sleep apnea (adult) (pediatric): Secondary | ICD-10-CM | POA: Diagnosis not present

## 2018-10-22 DIAGNOSIS — D631 Anemia in chronic kidney disease: Secondary | ICD-10-CM | POA: Diagnosis not present

## 2018-10-22 DIAGNOSIS — M1990 Unspecified osteoarthritis, unspecified site: Secondary | ICD-10-CM | POA: Diagnosis not present

## 2018-10-22 DIAGNOSIS — E118 Type 2 diabetes mellitus with unspecified complications: Secondary | ICD-10-CM | POA: Diagnosis not present

## 2018-10-22 DIAGNOSIS — N2581 Secondary hyperparathyroidism of renal origin: Secondary | ICD-10-CM | POA: Diagnosis not present

## 2018-10-22 DIAGNOSIS — I129 Hypertensive chronic kidney disease with stage 1 through stage 4 chronic kidney disease, or unspecified chronic kidney disease: Secondary | ICD-10-CM | POA: Diagnosis not present

## 2018-10-22 DIAGNOSIS — Z8739 Personal history of other diseases of the musculoskeletal system and connective tissue: Secondary | ICD-10-CM | POA: Diagnosis not present

## 2018-10-22 DIAGNOSIS — E1122 Type 2 diabetes mellitus with diabetic chronic kidney disease: Secondary | ICD-10-CM | POA: Diagnosis not present

## 2018-10-22 DIAGNOSIS — E669 Obesity, unspecified: Secondary | ICD-10-CM | POA: Diagnosis not present

## 2018-10-22 DIAGNOSIS — N184 Chronic kidney disease, stage 4 (severe): Secondary | ICD-10-CM | POA: Diagnosis not present

## 2018-10-22 DIAGNOSIS — Z9989 Dependence on other enabling machines and devices: Secondary | ICD-10-CM | POA: Diagnosis not present

## 2018-10-22 DIAGNOSIS — Z87442 Personal history of urinary calculi: Secondary | ICD-10-CM | POA: Diagnosis not present

## 2018-12-21 ENCOUNTER — Other Ambulatory Visit: Payer: Self-pay | Admitting: Internal Medicine

## 2018-12-30 ENCOUNTER — Telehealth: Payer: Self-pay

## 2018-12-30 NOTE — Telephone Encounter (Signed)
Author phoned pt. to set up AWV. Appointment made for 2/24, with same-day visit with Dr. Volanda Napoleon for CPE. Copied from [place] [phone]. Topic: Medicare AWV >> Nov 22, 2018 11:08 AM Bea Graff, NT wrote: Reason for CRM: Pt calling to schedule his AWV, if possible by the end of this year. >> Nov 22, 2018 12:10 PM Virl Cagey, CMA wrote: Vita Barley, are we still able to schedule AWV with the Provider since there is no Health Coach on sight?

## 2019-01-03 DIAGNOSIS — S134XXA Sprain of ligaments of cervical spine, initial encounter: Secondary | ICD-10-CM | POA: Diagnosis not present

## 2019-01-03 DIAGNOSIS — M4004 Postural kyphosis, thoracic region: Secondary | ICD-10-CM | POA: Diagnosis not present

## 2019-01-03 DIAGNOSIS — M9902 Segmental and somatic dysfunction of thoracic region: Secondary | ICD-10-CM | POA: Diagnosis not present

## 2019-01-03 DIAGNOSIS — M9903 Segmental and somatic dysfunction of lumbar region: Secondary | ICD-10-CM | POA: Diagnosis not present

## 2019-01-03 DIAGNOSIS — M546 Pain in thoracic spine: Secondary | ICD-10-CM | POA: Diagnosis not present

## 2019-01-03 DIAGNOSIS — S335XXA Sprain of ligaments of lumbar spine, initial encounter: Secondary | ICD-10-CM | POA: Diagnosis not present

## 2019-01-03 DIAGNOSIS — M9901 Segmental and somatic dysfunction of cervical region: Secondary | ICD-10-CM | POA: Diagnosis not present

## 2019-01-08 ENCOUNTER — Telehealth: Payer: Self-pay

## 2019-01-08 NOTE — Telephone Encounter (Signed)
No paperwork has been received regarding pt back brace

## 2019-01-10 ENCOUNTER — Ambulatory Visit: Payer: Medicare Other | Admitting: Internal Medicine

## 2019-01-10 DIAGNOSIS — S134XXA Sprain of ligaments of cervical spine, initial encounter: Secondary | ICD-10-CM | POA: Diagnosis not present

## 2019-01-10 DIAGNOSIS — M9903 Segmental and somatic dysfunction of lumbar region: Secondary | ICD-10-CM | POA: Diagnosis not present

## 2019-01-10 DIAGNOSIS — M9901 Segmental and somatic dysfunction of cervical region: Secondary | ICD-10-CM | POA: Diagnosis not present

## 2019-01-10 DIAGNOSIS — M546 Pain in thoracic spine: Secondary | ICD-10-CM | POA: Diagnosis not present

## 2019-01-10 DIAGNOSIS — M4004 Postural kyphosis, thoracic region: Secondary | ICD-10-CM | POA: Diagnosis not present

## 2019-01-10 DIAGNOSIS — M9902 Segmental and somatic dysfunction of thoracic region: Secondary | ICD-10-CM | POA: Diagnosis not present

## 2019-01-10 DIAGNOSIS — S335XXA Sprain of ligaments of lumbar spine, initial encounter: Secondary | ICD-10-CM | POA: Diagnosis not present

## 2019-01-13 DIAGNOSIS — M9901 Segmental and somatic dysfunction of cervical region: Secondary | ICD-10-CM | POA: Diagnosis not present

## 2019-01-13 DIAGNOSIS — M9902 Segmental and somatic dysfunction of thoracic region: Secondary | ICD-10-CM | POA: Diagnosis not present

## 2019-01-13 DIAGNOSIS — S335XXA Sprain of ligaments of lumbar spine, initial encounter: Secondary | ICD-10-CM | POA: Diagnosis not present

## 2019-01-13 DIAGNOSIS — M4004 Postural kyphosis, thoracic region: Secondary | ICD-10-CM | POA: Diagnosis not present

## 2019-01-13 DIAGNOSIS — M546 Pain in thoracic spine: Secondary | ICD-10-CM | POA: Diagnosis not present

## 2019-01-13 DIAGNOSIS — S134XXA Sprain of ligaments of cervical spine, initial encounter: Secondary | ICD-10-CM | POA: Diagnosis not present

## 2019-01-13 DIAGNOSIS — M9903 Segmental and somatic dysfunction of lumbar region: Secondary | ICD-10-CM | POA: Diagnosis not present

## 2019-01-14 DIAGNOSIS — E669 Obesity, unspecified: Secondary | ICD-10-CM | POA: Diagnosis not present

## 2019-01-14 DIAGNOSIS — Z9989 Dependence on other enabling machines and devices: Secondary | ICD-10-CM | POA: Diagnosis not present

## 2019-01-14 DIAGNOSIS — N184 Chronic kidney disease, stage 4 (severe): Secondary | ICD-10-CM | POA: Diagnosis not present

## 2019-01-14 DIAGNOSIS — E118 Type 2 diabetes mellitus with unspecified complications: Secondary | ICD-10-CM | POA: Diagnosis not present

## 2019-01-14 DIAGNOSIS — G4733 Obstructive sleep apnea (adult) (pediatric): Secondary | ICD-10-CM | POA: Diagnosis not present

## 2019-01-14 DIAGNOSIS — I129 Hypertensive chronic kidney disease with stage 1 through stage 4 chronic kidney disease, or unspecified chronic kidney disease: Secondary | ICD-10-CM | POA: Diagnosis not present

## 2019-01-14 DIAGNOSIS — Z87442 Personal history of urinary calculi: Secondary | ICD-10-CM | POA: Diagnosis not present

## 2019-01-14 DIAGNOSIS — E1122 Type 2 diabetes mellitus with diabetic chronic kidney disease: Secondary | ICD-10-CM | POA: Diagnosis not present

## 2019-01-14 DIAGNOSIS — M1990 Unspecified osteoarthritis, unspecified site: Secondary | ICD-10-CM | POA: Diagnosis not present

## 2019-01-14 DIAGNOSIS — D631 Anemia in chronic kidney disease: Secondary | ICD-10-CM | POA: Diagnosis not present

## 2019-01-14 DIAGNOSIS — N2581 Secondary hyperparathyroidism of renal origin: Secondary | ICD-10-CM | POA: Diagnosis not present

## 2019-01-14 DIAGNOSIS — N189 Chronic kidney disease, unspecified: Secondary | ICD-10-CM | POA: Diagnosis not present

## 2019-01-14 DIAGNOSIS — Z8739 Personal history of other diseases of the musculoskeletal system and connective tissue: Secondary | ICD-10-CM | POA: Diagnosis not present

## 2019-01-15 DIAGNOSIS — S134XXA Sprain of ligaments of cervical spine, initial encounter: Secondary | ICD-10-CM | POA: Diagnosis not present

## 2019-01-15 DIAGNOSIS — M4004 Postural kyphosis, thoracic region: Secondary | ICD-10-CM | POA: Diagnosis not present

## 2019-01-15 DIAGNOSIS — M9903 Segmental and somatic dysfunction of lumbar region: Secondary | ICD-10-CM | POA: Diagnosis not present

## 2019-01-15 DIAGNOSIS — S335XXA Sprain of ligaments of lumbar spine, initial encounter: Secondary | ICD-10-CM | POA: Diagnosis not present

## 2019-01-15 DIAGNOSIS — M546 Pain in thoracic spine: Secondary | ICD-10-CM | POA: Diagnosis not present

## 2019-01-15 DIAGNOSIS — M9902 Segmental and somatic dysfunction of thoracic region: Secondary | ICD-10-CM | POA: Diagnosis not present

## 2019-01-15 DIAGNOSIS — M9901 Segmental and somatic dysfunction of cervical region: Secondary | ICD-10-CM | POA: Diagnosis not present

## 2019-01-18 ENCOUNTER — Other Ambulatory Visit: Payer: Self-pay | Admitting: Internal Medicine

## 2019-01-20 ENCOUNTER — Ambulatory Visit: Payer: Medicare Other | Admitting: Family Medicine

## 2019-01-20 DIAGNOSIS — S134XXA Sprain of ligaments of cervical spine, initial encounter: Secondary | ICD-10-CM | POA: Diagnosis not present

## 2019-01-20 DIAGNOSIS — M9901 Segmental and somatic dysfunction of cervical region: Secondary | ICD-10-CM | POA: Diagnosis not present

## 2019-01-20 DIAGNOSIS — M546 Pain in thoracic spine: Secondary | ICD-10-CM | POA: Diagnosis not present

## 2019-01-20 DIAGNOSIS — M9903 Segmental and somatic dysfunction of lumbar region: Secondary | ICD-10-CM | POA: Diagnosis not present

## 2019-01-20 DIAGNOSIS — M9902 Segmental and somatic dysfunction of thoracic region: Secondary | ICD-10-CM | POA: Diagnosis not present

## 2019-01-20 DIAGNOSIS — M4004 Postural kyphosis, thoracic region: Secondary | ICD-10-CM | POA: Diagnosis not present

## 2019-01-20 DIAGNOSIS — S335XXA Sprain of ligaments of lumbar spine, initial encounter: Secondary | ICD-10-CM | POA: Diagnosis not present

## 2019-01-21 DIAGNOSIS — M9901 Segmental and somatic dysfunction of cervical region: Secondary | ICD-10-CM | POA: Diagnosis not present

## 2019-01-21 DIAGNOSIS — S335XXA Sprain of ligaments of lumbar spine, initial encounter: Secondary | ICD-10-CM | POA: Diagnosis not present

## 2019-01-21 DIAGNOSIS — M546 Pain in thoracic spine: Secondary | ICD-10-CM | POA: Diagnosis not present

## 2019-01-21 DIAGNOSIS — M4004 Postural kyphosis, thoracic region: Secondary | ICD-10-CM | POA: Diagnosis not present

## 2019-01-21 DIAGNOSIS — M9903 Segmental and somatic dysfunction of lumbar region: Secondary | ICD-10-CM | POA: Diagnosis not present

## 2019-01-21 DIAGNOSIS — M9902 Segmental and somatic dysfunction of thoracic region: Secondary | ICD-10-CM | POA: Diagnosis not present

## 2019-01-21 DIAGNOSIS — S134XXA Sprain of ligaments of cervical spine, initial encounter: Secondary | ICD-10-CM | POA: Diagnosis not present

## 2019-01-22 ENCOUNTER — Ambulatory Visit: Payer: Medicare Other | Admitting: Nutrition

## 2019-01-24 DIAGNOSIS — S335XXA Sprain of ligaments of lumbar spine, initial encounter: Secondary | ICD-10-CM | POA: Diagnosis not present

## 2019-01-24 DIAGNOSIS — M9903 Segmental and somatic dysfunction of lumbar region: Secondary | ICD-10-CM | POA: Diagnosis not present

## 2019-01-24 DIAGNOSIS — M9902 Segmental and somatic dysfunction of thoracic region: Secondary | ICD-10-CM | POA: Diagnosis not present

## 2019-01-24 DIAGNOSIS — S134XXA Sprain of ligaments of cervical spine, initial encounter: Secondary | ICD-10-CM | POA: Diagnosis not present

## 2019-01-24 DIAGNOSIS — M4004 Postural kyphosis, thoracic region: Secondary | ICD-10-CM | POA: Diagnosis not present

## 2019-01-24 DIAGNOSIS — M9901 Segmental and somatic dysfunction of cervical region: Secondary | ICD-10-CM | POA: Diagnosis not present

## 2019-01-24 DIAGNOSIS — M546 Pain in thoracic spine: Secondary | ICD-10-CM | POA: Diagnosis not present

## 2019-01-24 NOTE — Progress Notes (Deleted)
Subjective:   Bruce Weber. is a 68 y.o. male who presents for an Initial Medicare Annual Wellness Visit.  Review of Systems  No ROS.  Medicare Wellness Visit. Additional risk factors are reflected in the social history.    Sleep patterns: {SX; SLEEP PATTERNS:18802::"feels rested on waking","does not get up to void","gets up *** times nightly to void","sleeps *** hours nightly"}.    Home Safety/Smoke Alarms: Feels safe in home. Smoke alarms in place.  Living environment; residence and Firearm Safety: {Rehab home environment / accessibility:30080::"no firearms","firearms stored safely"}. Seat Belt Safety/Bike Helmet: Wears seat belt.   Male:   CCS-     PSA- No results found for: PSA     Objective:    There were no vitals filed for this visit. There is no height or weight on file to calculate BMI.  Advanced Directives 03/01/2017 11/25/2012  Does Patient Have a Medical Advance Directive? Yes Patient has advance directive, copy not in chart  Type of Advance Directive [place];Living will Living will  Copy of [place] in Chart? No - copy requested -    Current Medications (verified) Outpatient Encounter Medications as of 01/27/2019  Medication Sig  . acetaminophen (TYLENOL) 500 MG tablet Take 1,000 mg by mouth 2 (two) times daily as needed for headache.  . allopurinol (ZYLOPRIM) 100 MG tablet Take 300 mg by mouth daily.   Marland Kitchen amLODipine (NORVASC) 10 MG tablet Take 10 mg by mouth daily.  . calcitRIOL (ROCALTROL) 0.25 MCG capsule Take 0.25 mcg by mouth at bedtime.   . clomiPHENE (CLOMID) 50 MG tablet Take 25 mg by mouth at bedtime.   . famciclovir (FAMVIR) 500 MG tablet Take 1,000 mg by mouth 2 (two) times daily as needed (fever blisters).   . famotidine (PEPCID) 40 MG tablet TAKE 1 TABLET (40 MG TOTAL) BY MOUTH DAILY. WITH BREAKFAST  . felodipine (PLENDIL) 5 MG 24 hr tablet Take 5 mg by mouth at bedtime.   . fluticasone (FLONASE) 50  MCG/ACT nasal spray Place 1 spray into both nostrils daily as needed for allergies or rhinitis.  . furosemide (LASIX) 40 MG tablet Take 40 mg by mouth.  . propranolol (INDERAL) 80 MG tablet Take 1 tablet (80 mg total) by mouth 2 (two) times daily.  . psyllium (METAMUCIL) 58.6 % powder Take 1 packet by mouth daily. As needed for constipation  . rosuvastatin (CRESTOR) 5 MG tablet Take 1 tablet (5 mg total) by mouth daily.  . vitamin C (ASCORBIC ACID) 500 MG tablet Take 500 mg by mouth 2 (two) times a week.   . zolpidem (AMBIEN) 5 MG tablet TAKE 1 TABLET BY MOUTH AT BEDTIME FOR SLEEP AS NEEDED   No facility-administered encounter medications on file as of 01/27/2019.     Allergies (verified) Dye fdc blue [brilliant blue fcf (fd&c blue #1)] and Iodinated diagnostic agents   History: Past Medical History:  Diagnosis Date  . Adenoma 11/2009   1.1 cm from TCS  . DM (diabetes mellitus) ([place])   . GERD (gastroesophageal reflux disease)   . High cholesterol   . HTN (hypertension)   . Kidney stones   . Obesity (BMI 30-39.9) DEC 2011 223 LBS  . Rectal bleeding 2011   secondary to hemorrhoids   Past Surgical History:  Procedure Laterality Date  . CIRCUMCISION    . COLONOSCOPY  Dec 2010 [facility]   MOD IH, SIMPLE ADENOMA 1.1 CM  . COLONOSCOPY  11/25/2012   SLF:  1. Sessile polyp measuring 49mm in size was found at the hepatic flesure; polypectomy was performed using snare cautery 2. Moderate diverticulosis was noted in the ascending colon and sigmoid colon. 3. The colon mucosa was otherwise normal 4. Large internal hemorrhoids.   . COLONOSCOPY N/A 03/01/2017   Procedure: COLONOSCOPY;  Surgeon: Danie Binder, MD;  Location: [facility];  Service: Endoscopy;  Laterality: N/A;  10:30 AM  . HERNIA REPAIR     Family History  Problem Relation Age of Onset  . Lung cancer Father   . Colon cancer Neg Hx   . Colon polyps Neg Hx    Social History   Socioeconomic History  . Marital status: Married     Spouse name: Not on file  . Number of children: Not on file  . Years of education: Not on file  . Highest education level: Not on file  Occupational History  . Occupation: Chief Strategy Officer for [employer]  Social Needs  . Financial resource strain: Not on file  . Food insecurity:    Worry: Not on file    Inability: Not on file  . Transportation needs:    Medical: Not on file    Non-medical: Not on file  Tobacco Use  . Smoking status: Never Smoker  . Smokeless tobacco: Never Used  . Tobacco comment: Never smoked  Substance and Sexual Activity  . Alcohol use: No  . Drug use: No  . Sexual activity: Never  Lifestyle  . Physical activity:    Days per week: Not on file    Minutes per session: Not on file  . Stress: Not on file  Relationships  . Social connections:    Talks on phone: Not on file    Gets together: Not on file    Attends religious service: Not on file    Active member of club or organization: Not on file    Attends meetings of clubs or organizations: Not on file    Relationship status: Not on file  Other Topics Concern  . Not on file  Social History Narrative   NO KIDS. MARRIED TO 2ND WIFE.   Tobacco Counseling Counseling given: Not Answered Comment: Never smoked   Activities of Daily Living No flowsheet data found.   Immunizations and Health Maintenance Immunization History  Administered Date(s) Administered  . Influenza Split 08/04/2012, 08/04/2014, 09/01/2015  . Influenza Whole 08/05/2013  . Zoster 08/05/2013   Health Maintenance Due  Topic Date Due  . HEMOGLOBIN A1C  [birth year]  . Hepatitis C Screening  November 01, 1951  . FOOT EXAM  07/03/1959  . OPHTHALMOLOGY EXAM  07/03/1959  . URINE MICROALBUMIN  07/03/1959  . TETANUS/TDAP  07/02/1968  . PNA vac Low Risk Adult (1 of 2 - PCV13) 07/02/2014    Patient Care Team: Billie Ruddy, MD as PCP - General (Family Medicine) Danie Binder, MD (Gastroenterology)  Indicate any recent Medical Services you may  have received from other than [facility] providers in the past year (date may be approximate).    Assessment:   This is a routine wellness examination for Bruce Weber. Physical assessment deferred to PCP.   Hearing/Vision screen No exam data present  Dietary issues and exercise activities discussed:   Diet (meal preparation, eat out, water intake, caffeinated beverages, dairy products, fruits and vegetables): {Desc; diets:16563}  Goals   None    Depression Screen PHQ 2/9 Scores 09/11/2018 06/17/2018 02/20/2018 11/28/2017  PHQ - 2 Score 0 0 0 0    Fall  Risk Fall Risk  09/11/2018 06/17/2018 02/20/2018 11/28/2017  Falls in the past year? No No No No     Cognitive Function:        Screening Tests Health Maintenance  Topic Date Due  . HEMOGLOBIN A1C  1950-12-29  . Hepatitis C Screening  [birth year]  . FOOT EXAM  07/03/1959  . OPHTHALMOLOGY EXAM  07/03/1959  . URINE MICROALBUMIN  07/03/1959  . TETANUS/TDAP  07/02/1968  . PNA vac Low Risk Adult (1 of 2 - PCV13) 07/02/2014  . COLONOSCOPY  03/02/2027  . INFLUENZA VACCINE  Completed    Qualifies for Shingles Vaccine?        Plan:     I have personally reviewed and noted the following in the patient's chart:   . Medical and social history . Use of alcohol, tobacco or illicit drugs  . Current medications and supplements . Functional ability and status . Nutritional status . Physical activity . Advanced directives . List of other physicians . Hospitalizations, surgeries, and ER visits in previous 12 months . Vitals . Screenings to include cognitive, depression, and falls . Referrals and appointments  In addition, I have reviewed and discussed with patient certain preventive protocols, quality metrics, and best practice recommendations. A written personalized care plan for preventive services as well as general preventive health recommendations were provided to patient.     Alphia Moh, RN   01/24/2019

## 2019-01-27 ENCOUNTER — Ambulatory Visit (INDEPENDENT_AMBULATORY_CARE_PROVIDER_SITE_OTHER): Payer: Medicare Other

## 2019-01-27 ENCOUNTER — Ambulatory Visit: Payer: Medicare Other

## 2019-01-27 ENCOUNTER — Ambulatory Visit (INDEPENDENT_AMBULATORY_CARE_PROVIDER_SITE_OTHER): Payer: Medicare Other | Admitting: Family Medicine

## 2019-01-27 ENCOUNTER — Encounter: Payer: Self-pay | Admitting: Family Medicine

## 2019-01-27 VITALS — BP 140/78 | HR 59 | Temp 97.8°F | Wt 209.0 lb

## 2019-01-27 DIAGNOSIS — M545 Low back pain, unspecified: Secondary | ICD-10-CM

## 2019-01-27 DIAGNOSIS — E785 Hyperlipidemia, unspecified: Secondary | ICD-10-CM

## 2019-01-27 DIAGNOSIS — I10 Essential (primary) hypertension: Secondary | ICD-10-CM | POA: Diagnosis not present

## 2019-01-27 DIAGNOSIS — R6883 Chills (without fever): Secondary | ICD-10-CM | POA: Diagnosis not present

## 2019-01-27 DIAGNOSIS — Z0000 Encounter for general adult medical examination without abnormal findings: Secondary | ICD-10-CM | POA: Diagnosis not present

## 2019-01-27 LAB — BASIC METABOLIC PANEL
BUN: 34 mg/dL — ABNORMAL HIGH (ref 6–23)
CHLORIDE: 108 meq/L (ref 96–112)
CO2: 25 mEq/L (ref 19–32)
Calcium: 9.6 mg/dL (ref 8.4–10.5)
Creatinine, Ser: 3.08 mg/dL — ABNORMAL HIGH (ref 0.40–1.50)
GFR: 24.44 mL/min — ABNORMAL LOW (ref >60.00)
Glucose, Bld: 110 mg/dL — ABNORMAL HIGH (ref 70–99)
POTASSIUM: 4.6 meq/L (ref 3.5–5.1)
SODIUM: 144 meq/L (ref 135–145)

## 2019-01-27 LAB — LIPID PANEL
Cholesterol: 164 mg/dL (ref 0–200)
HDL: 48.9 mg/dL (ref >39.00)
LDL Cholesterol: 96 mg/dL (ref 0–99)
NonHDL: 115.45
Total CHOL/HDL Ratio: 3
Triglycerides: 97 mg/dL (ref 0.0–149.0)
VLDL: 19.4 mg/dL (ref 0.0–40.0)

## 2019-01-27 LAB — POCT URINALYSIS DIPSTICK
Bilirubin, UA: NEGATIVE
Blood, UA: NEGATIVE
GLUCOSE UA: NEGATIVE
Ketones, UA: NEGATIVE
Leukocytes, UA: NEGATIVE
Nitrite, UA: NEGATIVE
Protein, UA: POSITIVE — AB
Spec Grav, UA: 1.015 (ref 1.010–1.025)
Urobilinogen, UA: 0.2 E.U./dL
pH, UA: 6.5 (ref 5.0–8.0)

## 2019-01-27 LAB — CBC WITH DIFFERENTIAL/PLATELET
BASOS PCT: 0.8 % (ref 0.0–3.0)
Basophils Absolute: 0.1 10*3/uL (ref 0.0–0.1)
Eosinophils Absolute: 0.4 10*3/uL (ref 0.0–0.7)
Eosinophils Relative: 3.3 % (ref 0.0–5.0)
HCT: 40.9 % (ref 39.0–52.0)
HEMOGLOBIN: 13.5 g/dL (ref 13.0–17.0)
LYMPHS ABS: 1.6 10*3/uL (ref 0.7–4.0)
Lymphocytes Relative: 14.1 % (ref 12.0–46.0)
MCHC: 33 g/dL (ref 30.0–36.0)
MCV: 98.9 fl (ref 78.0–100.0)
MONO ABS: 0.7 10*3/uL (ref 0.1–1.0)
MONOS PCT: 6 % (ref 3.0–12.0)
Neutro Abs: 8.7 10*3/uL — ABNORMAL HIGH (ref 1.4–7.7)
Neutrophils Relative %: 75.8 % (ref 43.0–77.0)
PLATELETS: 224 10*3/uL (ref 150.0–400.0)
RBC: 4.14 Mil/uL — AB (ref 4.22–5.81)
RDW: 14.9 % (ref 11.5–15.5)
WBC: 11.5 10*3/uL — ABNORMAL HIGH (ref 4.0–10.5)

## 2019-01-27 LAB — PSA: PSA: 1.41 ng/mL (ref 0.10–4.00)

## 2019-01-27 MED ORDER — CYCLOBENZAPRINE HCL 5 MG PO TABS: 5.0000 mg | ORAL_TABLET | Freq: Every evening | ORAL | 0 refills | Status: DC | PRN

## 2019-01-27 NOTE — Patient Instructions (Signed)
Back Exercises If you have pain in your back, do these exercises 2-3 times each day or as told by your doctor. When the pain goes away, do the exercises once each day, but repeat the steps more times for each exercise (do more repetitions). If you do not have pain in your back, do these exercises once each day or as told by your doctor. Exercises Single Knee to Chest Do these steps 3-5 times in a row for each leg: 1. Lie on your back on a firm bed or the floor with your legs stretched out. 2. Bring one knee to your chest. 3. Hold your knee to your chest by grabbing your knee or thigh. 4. Pull on your knee until you feel a gentle stretch in your lower back. 5. Keep doing the stretch for 10-30 seconds. 6. Slowly let go of your leg and straighten it. Pelvic Tilt Do these steps 5-10 times in a row: 1. Lie on your back on a firm bed or the floor with your legs stretched out. 2. Bend your knees so they point up to the ceiling. Your feet should be flat on the floor. 3. Tighten your lower belly (abdomen) muscles to press your lower back against the floor. This will make your tailbone point up to the ceiling instead of pointing down to your feet or the floor. 4. Stay in this position for 5-10 seconds while you gently tighten your muscles and breathe evenly. Cat-Cow Do these steps until your lower back bends more easily: 1. Get on your hands and knees on a firm surface. Keep your hands under your shoulders, and keep your knees under your hips. You may put padding under your knees. 2. Let your head hang down, and make your tailbone point down to the floor so your lower back is round like the back of a cat. 3. Stay in this position for 5 seconds. 4. Slowly lift your head and make your tailbone point up to the ceiling so your back hangs low (sags) like the back of a cow. 5. Stay in this position for 5 seconds.  Press-Ups Do these steps 5-10 times in a row: 1. Lie on your belly (face-down) on the  floor. 2. Place your hands near your head, about shoulder-width apart. 3. While you keep your back relaxed and keep your hips on the floor, slowly straighten your arms to raise the top half of your body and lift your shoulders. Do not use your back muscles. To make yourself more comfortable, you may change where you place your hands. 4. Stay in this position for 5 seconds. 5. Slowly return to lying flat on the floor.  Bridges Do these steps 10 times in a row: 1. Lie on your back on a firm surface. 2. Bend your knees so they point up to the ceiling. Your feet should be flat on the floor. 3. Tighten your butt muscles and lift your butt off of the floor until your waist is almost as high as your knees. If you do not feel the muscles working in your butt and the back of your thighs, slide your feet 1-2 inches farther away from your butt. 4. Stay in this position for 3-5 seconds. 5. Slowly lower your butt to the floor, and let your butt muscles relax. If this exercise is too easy, try doing it with your arms crossed over your chest. Belly Crunches Do these steps 5-10 times in a row: 1. Lie on your back on a  firm bed or the floor with your legs stretched out. 2. Bend your knees so they point up to the ceiling. Your feet should be flat on the floor. 3. Cross your arms over your chest. 4. Tip your chin a little bit toward your chest but do not bend your neck. 5. Tighten your belly muscles and slowly raise your chest just enough to lift your shoulder blades a tiny bit off of the floor. 6. Slowly lower your chest and your head to the floor. Back Lifts Do these steps 5-10 times in a row: 1. Lie on your belly (face-down) with your arms at your sides, and rest your forehead on the floor. 2. Tighten the muscles in your legs and your butt. 3. Slowly lift your chest off of the floor while you keep your hips on the floor. Keep the back of your head in line with the curve in your back. Look at the floor  while you do this. 4. Stay in this position for 3-5 seconds. 5. Slowly lower your chest and your face to the floor. Contact a doctor if:  Your back pain gets a lot worse when you do an exercise.  Your back pain does not lessen 2 hours after you exercise. If you have any of these problems, stop doing the exercises. Do not do them again unless your doctor says it is okay. Get help right away if:  You have sudden, very bad back pain. If this happens, stop doing the exercises. Do not do them again unless your doctor says it is okay. This information is not intended to replace advice given to you by your health care provider. Make sure you discuss any questions you have with your health care provider. Document Released: 12/23/2010 Document Revised: 08/14/2018 Document Reviewed: 01/14/2015 Elsevier Interactive Patient Education  2019 [place] Injury Prevention Back injuries can be very painful. They can also be difficult to heal. After having one back injury, you are more likely to have another one again. It is important to learn how to avoid injuring or re-injuring your back. The following tips can help you to prevent a back injury. What actions can I take to prevent back injuries? Changes in your diet Talk with your doctor about what to eat. Some foods can make the bones strong.  Talk with your doctor about how much calcium and vitamin D you need each day. These nutrients help to prevent weakening of the bones (osteoporosis).  Eat foods that have calcium. These include: ? Dairy products. ? Green leafy vegetables. ? Food and drinks that have had calcium added to them (fortified).  Eat foods that have vitamin D. These include: ? Milk. ? Food and drinks that have had vitamin D added to them.  Take other supplements and vitamins only as told by your doctor. Physical fitness Physical fitness makes your bones and muscles strong. It also improves your balance and  strength.  Exercise for 30 minutes per day on most days of the week, or as told by your doctor. Make sure to: ? Do aerobic exercises, such as walking, jogging, biking, or swimming. ? Do exercises that increase balance and strength, such as tai chi and yoga. ? Do stretching exercises. This helps with flexibility. ? Develop strong belly (abdominal) muscles. Your belly muscles help to support your back.  Stay at a healthy weight. This lowers your risk of a back injury. Good posture        Prevent back injuries by  developing and maintaining a good posture. To do this:  Sit up straight and stand up straight. Avoid leaning forward when you sit or hunching over when you stand.  Choose chairs that have good low-back (lumbar) support.  If you work at a desk: ? Sit close to it so you do not need to lean over. ? Keep your chin tucked in. ? Keep your neck drawn back. ? Keep your elbows bent so that your arms make a corner (right angle).  When you drive: ? Sit high and close to the steering wheel. Add a low-back support to your car seat, if needed. ? Take breaks every hour if you are driving for long periods of time.  Avoid sitting or standing in one position for very long. Take breaks to get up, stretch, and walk around at least once every hour.  Sleep on your side with your knees slightly bent, or sleep on your back with a pillow under your knees.  Lifting, twisting, and reaching   Heavy lifting ? Avoid heavy lifting, especially lifting over and over again. If you must do heavy lifting: ? Stretch before lifting. ? Work slowly. ? Rest between lifts. ? Use a tool such as a cart or a dolly to move objects if one is available. ? Make several small trips instead of carrying one heavy load. ? Ask for help when you need it, especially when moving big objects. ? Follow these steps when lifting: ? Stand with your feet shoulder-width apart. ? Get as close to the object as you can. Do not  pick up a heavy object that is far from your body. ? Use handles or lifting straps if they are available. ? Bend at your knees. Squat down, but keep your heels off the floor. ? Keep your shoulders back. Keep your chin tucked in. Keep your back straight. ? Lift the object slowly while you tighten the muscles in your legs, belly, and bottom. Keep the object as close to the center of your body as possible. ? Follow these steps when putting down a heavy load: ? Stand with your feet shoulder-width apart. ? Lower the object slowly while you tighten the muscles in your legs, belly, and bottom. Keep the object as close to the center of your body as possible. ? Keep your shoulders back. Keep your chin tucked in. Keep your back straight. ? Bend at your knees. Squat down, but keep your heels off the floor. ? Use handles or lifting straps if they are available.  Twisting and reaching ? Avoid lifting heavy objects above your waist. ? Do not twist at your waist while you are lifting or carrying a load. If you need to turn, move your feet. ? Do not bend over without bending at your knees. ? Avoid reaching over your head, across a table, or for an object on a high surface. Other things to do   Avoid wet floors and icy ground. Keep sidewalks clear of ice to prevent falls.  Do not sleep on a mattress that is too soft or too hard.  Store heavier objects on shelves at waist level.  Store lighter objects on lower or higher shelves.  Find ways to lower your stress, such as: ? Exercise. ? Massage. ? Relaxation techniques.  Talk with your doctor if you feel anxious or depressed. These conditions can make back pain worse.  Wear flat heel shoes with cushioned soles.  Use both shoulder straps when carrying a backpack.  Do  not use any products that contain nicotine or tobacco, such as cigarettes and e-cigarettes. If you need help quitting, ask your doctor. Summary  Back injuries can be very painful and  difficult to heal.  You can keep your back healthy by making certain changes. These include eating foods that make bones strong, working on being physically fit, developing a good posture, and lifting heavy objects in a safe way. This information is not intended to replace advice given to you by your health care provider. Make sure you discuss any questions you have with your health care provider. Document Released: 05/08/2008 Document Revised: 01/11/2018 Document Reviewed: 01/11/2018 Elsevier Interactive Patient Education  2019 [employer].

## 2019-01-28 DIAGNOSIS — I129 Hypertensive chronic kidney disease with stage 1 through stage 4 chronic kidney disease, or unspecified chronic kidney disease: Secondary | ICD-10-CM | POA: Diagnosis not present

## 2019-01-30 ENCOUNTER — Encounter: Payer: Self-pay | Admitting: Family Medicine

## 2019-01-30 DIAGNOSIS — M9901 Segmental and somatic dysfunction of cervical region: Secondary | ICD-10-CM | POA: Diagnosis not present

## 2019-01-30 DIAGNOSIS — M9903 Segmental and somatic dysfunction of lumbar region: Secondary | ICD-10-CM | POA: Diagnosis not present

## 2019-01-30 DIAGNOSIS — M546 Pain in thoracic spine: Secondary | ICD-10-CM | POA: Diagnosis not present

## 2019-01-30 DIAGNOSIS — S134XXA Sprain of ligaments of cervical spine, initial encounter: Secondary | ICD-10-CM | POA: Diagnosis not present

## 2019-01-30 DIAGNOSIS — S335XXA Sprain of ligaments of lumbar spine, initial encounter: Secondary | ICD-10-CM | POA: Diagnosis not present

## 2019-01-30 DIAGNOSIS — M4004 Postural kyphosis, thoracic region: Secondary | ICD-10-CM | POA: Diagnosis not present

## 2019-01-30 DIAGNOSIS — M9902 Segmental and somatic dysfunction of thoracic region: Secondary | ICD-10-CM | POA: Diagnosis not present

## 2019-01-30 NOTE — Progress Notes (Signed)
Subjective:   Bruce Weber. is a 68 y.o. male who presents for a Welcome to Medicare exam.   Review of Systems: Positive low back pain and chills.  Otherwise 13 point review of systems negative     Pt has been doing well overall.  Is taking below to pain, Norvasc 10 mg, propranolol 80 mg twice daily for blood pressure.  States BP is been okay at home.  Pt typically exercises but endorses injuring his back in the last month.  Does not recall how he injured his back.  But has been unable to walk on the treadmill since.  Patient was going to the chiropractor for this, but notes continued dull low back pain.  At times the pain makes it difficult to get comfortable and rest at night.  Patient also notes chills over the last 3 months though states his temperature has been normal.  Patient denies feeling sick.  HLD-taking Crestor 5 mg     Objective:    Today's Vitals   01/27/19 0841  BP: 140/78  Pulse: (!) 59  Temp: 97.8 F (36.6 C)  TempSrc: Oral  SpO2: 98%  Weight: 209 lb (94.8 kg)   Body mass index is 30.86 kg/m.  Medications Outpatient Encounter Medications as of 01/27/2019  Medication Sig  . acetaminophen (TYLENOL) 500 MG tablet Take 1,000 mg by mouth 2 (two) times daily as needed for headache.  . allopurinol (ZYLOPRIM) 100 MG tablet Take 300 mg by mouth daily.   Marland Kitchen amLODipine (NORVASC) 10 MG tablet Take 10 mg by mouth daily.  . calcitRIOL (ROCALTROL) 0.25 MCG capsule Take 0.25 mcg by mouth at bedtime.   . clomiPHENE (CLOMID) 50 MG tablet Take 25 mg by mouth at bedtime.   . cyclobenzaprine (FLEXERIL) 5 MG tablet Take 1 tablet (5 mg total) by mouth at bedtime as needed for muscle spasms.  . famciclovir (FAMVIR) 500 MG tablet Take 1,000 mg by mouth 2 (two) times daily as needed (fever blisters).   . famotidine (PEPCID) 40 MG tablet TAKE 1 TABLET (40 MG TOTAL) BY MOUTH DAILY. WITH BREAKFAST  . felodipine (PLENDIL) 5 MG 24 hr tablet Take 10 mg by mouth at bedtime.   .  fluticasone (FLONASE) 50 MCG/ACT nasal spray Place 1 spray into both nostrils daily as needed for allergies or rhinitis.  . furosemide (LASIX) 40 MG tablet Take 40 mg by mouth.  . propranolol (INDERAL) 80 MG tablet Take 1 tablet (80 mg total) by mouth 2 (two) times daily.  . psyllium (METAMUCIL) 58.6 % powder Take 1 packet by mouth daily. As needed for constipation  . rosuvastatin (CRESTOR) 5 MG tablet Take 1 tablet (5 mg total) by mouth daily.  . vitamin C (ASCORBIC ACID) 500 MG tablet Take 500 mg by mouth 2 (two) times a week.   . zolpidem (AMBIEN) 5 MG tablet TAKE 1 TABLET BY MOUTH AT BEDTIME FOR SLEEP AS NEEDED   No facility-administered encounter medications on file as of 01/27/2019.      History: Past Medical History:  Diagnosis Date  . Adenoma 11/2009   1.1 cm from TCS  . DM (diabetes mellitus) ([place])   . GERD (gastroesophageal reflux disease)   . High cholesterol   . HTN (hypertension)   . Kidney stones   . Obesity (BMI 30-39.9) DEC 2011 223 LBS  . Rectal bleeding 2011   secondary to hemorrhoids   Past Surgical History:  Procedure Laterality Date  . CIRCUMCISION    .  COLONOSCOPY  Dec 2010 [facility]   MOD IH, SIMPLE ADENOMA 1.1 CM  . COLONOSCOPY  11/25/2012   SLF: 1. Sessile polyp measuring 38mm in size was found at the hepatic flesure; polypectomy was performed using snare cautery 2. Moderate diverticulosis was noted in the ascending colon and sigmoid colon. 3. The colon mucosa was otherwise normal 4. Large internal hemorrhoids.   . COLONOSCOPY N/A 03/01/2017   Procedure: COLONOSCOPY;  Surgeon: Danie Binder, MD;  Location: [facility];  Service: Endoscopy;  Laterality: N/A;  10:30 AM  . HERNIA REPAIR      Family History  Problem Relation Age of Onset  . Lung cancer Father   . Colon cancer Neg Hx   . Colon polyps Neg Hx    Social History   Occupational History  . Occupation: Chief Strategy Officer for [employer]  Tobacco Use  . Smoking status: Never Smoker  . Smokeless tobacco: Never  Used  . Tobacco comment: Never smoked  Substance and Sexual Activity  . Alcohol use: No  . Drug use: No  . Sexual activity: Never   Tobacco Counseling Counseling given: Not Answered Comment: Never smoked   Immunizations and Health Maintenance Immunization History  Administered Date(s) Administered  . Influenza Split 08/04/2012, 08/04/2014, 09/01/2015  . Influenza Whole 08/05/2013  . Zoster 08/05/2013   Health Maintenance Due  Topic Date Due  . HEMOGLOBIN A1C  10/06/1951  . Hepatitis C Screening  1951-02-10  . FOOT EXAM  07/03/1959  . OPHTHALMOLOGY EXAM  07/03/1959  . URINE MICROALBUMIN  07/03/1959  . TETANUS/TDAP  07/02/1968  . PNA vac Low Risk Adult (1 of 2 - PCV13) 07/02/2014    Activities of Daily Living No difficulty with ADLs  Physical Exam   Gen. Pleasant, well developed, well-nourished, in NAD HEENT - [place]/AT, PERRL, no scleral icterus, no nasal drainage, pharynx without erythema or exudate.  TMs normal bilaterally.  No cervical lymphadenopathy. Neck: No JVD, no thyromegaly, no carotid bruits Lungs: no use of accessory muscles, CTAB, no wheezes, rales or rhonchi Cardiovascular: RRR, No r/g/m, no peripheral edema Abdomen: BS present, soft, nontender,nondistended Musculoskeletal: Mild TTP of lumbar paraspinal muscles, No deformities, moves all four extremities, no cyanosis or clubbing, normal tone GU/rectal: Deferred Neuro:  A&Ox3, CN II-XII intact, normal gait Skin:  Warm, dry, intact, no lesions  Advanced Directives: Discussed creating a well, getting a POA and a HCPOA.      Assessment:    This is a routine wellness  examination for this patient .   Vision/Hearing screen No issues with either.  Dietary issues and exercise activities discussed:    Pt exercising regularly, but has been unable to walk on treadmill since injuring back.  Goals   None     Depression Screen PHQ 2/9 Scores 09/11/2018 06/17/2018 02/20/2018 11/28/2017  PHQ - 2 Score 0 0 0 0      Fall Risk Fall Risk  09/11/2018  Falls in the past year? No   No recent falls.  Cognitive Function  no issues.  A&Ox3    Patient Care Team: Billie Ruddy, MD as PCP - General (Family Medicine) Danie Binder, MD (Gastroenterology) Deterding, Jeneen Rinks, MD as Consulting Physician (Nephrology)     Plan:    HTN: continue current meds.  Restart low impact exercise.  Monitor sodium intake.  Obtain BMP.  HLD: continue crestor 5 mg.  Continue lifestyle modifications.  Obtain lipid panel.  Acute low back pain:  Discussed stretching, tylenol, massage.  Will obtain lumbar  spine xray.  Given rx for flexeril, use sparingly, advised may make sleepy.  Pt advised not to take flexeril with ambien.  Obtain PSA.  Chills: will obtain labs (CBC) and UA.  I have personally reviewed and noted the following in the patient's chart:   . Medical and social history . Use of alcohol, tobacco or illicit drugs  . Current medications and supplements . Functional ability and status . Nutritional status . Physical activity . Advanced directives . List of other physicians . Hospitalizations, surgeries, and ER visits in previous 12 months . Vitals . Screenings to include cognitive, depression, and falls . Referrals and appointments  In addition, I have reviewed and discussed with patient certain preventive protocols, quality metrics, and best practice recommendations. A written personalized care plan for preventive services as well as general preventive health recommendations were provided to patient.    Billie Ruddy, MD 01/27/2019

## 2019-02-05 NOTE — Progress Notes (Signed)
Subjective:   Bruce Weber. is a 68 y.o. male who presents for an Initial Medicare Annual Wellness Visit.  Review of Systems  No ROS.  Medicare Wellness Visit. Additional risk factors are reflected in the social history.  Cardiac Risk Factors include: advanced age (>13men, >4 women);male gender;hypertension;obesity (BMI >30kg/m2);sedentary lifestyle;diabetes mellitus Sleep patterns: does not get up to void and sleeps 6-7 hours nightly. Goes ot bed late, often naps during the day.   Home Safety/Smoke Alarms: Feels safe in home. Smoke alarms in place.  Living environment; residence and Adult nurse: owns own home, mother co-habitates. No mobility issues, no need for DME at this time. Seat Belt Safety/Bike Helmet: Wears seat belt.   Male:   CCS- 02/2017, repeat 3-5 years per report, due 02/2022.    PSA-  Lab Results  Component Value Date   PSA 1.41 01/27/2019      Objective:    Today's Vitals   02/06/19 0812  BP: (!) 152/84  Pulse: (!) 56  Resp: 16  SpO2: 95%  Weight: 211 lb (95.7 kg)  Height: 5\' 9"  (1.753 m)   Body mass index is 31.16 kg/m.  Advanced Directives 02/06/2019 03/01/2017 11/25/2012  Does Patient Have a Medical Advance Directive? No Yes Patient has advance directive, copy not in chart  Type of Advance Directive - [place];Living will Living will  Copy of [place] in Chart? - No - copy requested -  Would patient like information on creating a medical advance directive? No - Patient declined - -    Current Medications (verified) Outpatient Encounter Medications as of 02/06/2019  Medication Sig  . acetaminophen (TYLENOL) 500 MG tablet Take 1,000 mg by mouth 2 (two) times daily as needed for headache.  . allopurinol (ZYLOPRIM) 100 MG tablet Take 300 mg by mouth daily.   Marland Kitchen amLODipine (NORVASC) 10 MG tablet Take 10 mg by mouth daily.  . calcitRIOL (ROCALTROL) 0.25 MCG capsule Take 0.25 mcg by mouth at bedtime.   .  clomiPHENE (CLOMID) 50 MG tablet Take 25 mg by mouth at bedtime.   . cyclobenzaprine (FLEXERIL) 5 MG tablet Take 1 tablet (5 mg total) by mouth at bedtime as needed for muscle spasms.  . famciclovir (FAMVIR) 500 MG tablet Take 1,000 mg by mouth 2 (two) times daily as needed (fever blisters).   . famotidine (PEPCID) 40 MG tablet TAKE 1 TABLET (40 MG TOTAL) BY MOUTH DAILY. WITH BREAKFAST  . felodipine (PLENDIL) 10 MG 24 hr tablet Take 10 mg by mouth daily.  . fluticasone (FLONASE) 50 MCG/ACT nasal spray Place 1 spray into both nostrils daily as needed for allergies or rhinitis.  . furosemide (LASIX) 40 MG tablet Take 40 mg by mouth.  . propranolol (INDERAL) 80 MG tablet Take 1 tablet (80 mg total) by mouth 2 (two) times daily.  . psyllium (METAMUCIL) 58.6 % powder Take 1 packet by mouth daily. As needed for constipation  . rosuvastatin (CRESTOR) 5 MG tablet Take 1 tablet (5 mg total) by mouth daily.  . vitamin C (ASCORBIC ACID) 500 MG tablet Take 500 mg by mouth 2 (two) times a week.   . zolpidem (AMBIEN) 5 MG tablet TAKE 1 TABLET BY MOUTH AT BEDTIME FOR SLEEP AS NEEDED  . [DISCONTINUED] felodipine (PLENDIL) 5 MG 24 hr tablet Take 10 mg by mouth at bedtime.    No facility-administered encounter medications on file as of 02/06/2019.     Allergies (verified) Dye fdc blue [place]  blue fcf (fd&c blue #1)] and Iodinated diagnostic agents   History: Past Medical History:  Diagnosis Date  . Adenoma 11/2009   1.1 cm from TCS  . DM (diabetes mellitus) ([place])   . GERD (gastroesophageal reflux disease)   . High cholesterol   . HTN (hypertension)   . Kidney stones   . Obesity (BMI 30-39.9) DEC 2011 223 LBS  . Rectal bleeding 2011   secondary to hemorrhoids   Past Surgical History:  Procedure Laterality Date  . CIRCUMCISION    . COLONOSCOPY  Dec 2010 [facility]   MOD IH, SIMPLE ADENOMA 1.1 CM  . COLONOSCOPY  11/25/2012   SLF: 1. Sessile polyp measuring 43mm in size was found at the hepatic  flesure; polypectomy was performed using snare cautery 2. Moderate diverticulosis was noted in the ascending colon and sigmoid colon. 3. The colon mucosa was otherwise normal 4. Large internal hemorrhoids.   . COLONOSCOPY N/A 03/01/2017   Procedure: COLONOSCOPY;  Surgeon: Danie Binder, MD;  Location: [facility];  Service: Endoscopy;  Laterality: N/A;  10:30 AM  . EYE SURGERY  05/2018   bilateral cataract surgery  . HERNIA REPAIR     Family History  Problem Relation Age of Onset  . Lung cancer Father   . Colon cancer Neg Hx   . Colon polyps Neg Hx    Social History   Socioeconomic History  . Marital status: Married    Spouse name: Not on file  . Number of children: 0  . Years of education: Not on file  . Highest education level: Not on file  Occupational History  . 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 Financial resource strain: Not hard at all  . Food insecurity:    Worry: Never true    Inability: Never true  . Transportation needs:    Medical: No    Non-medical: No  Tobacco Use  . Smoking status: Never Smoker  . Smokeless tobacco: Never Used  . Tobacco comment: Never smoked  Substance and Sexual Activity  . Alcohol use: No  . Drug use: No  . Sexual activity: Never  Lifestyle  . Physical activity:    Days per week: 0 days    Minutes per session: 0 min  . Stress: Not at all  Relationships  . Social connections:    Talks on phone: Twice a week    Gets together: Once a week    Attends religious service: More than 4 times per year    Active member of club or organization: No    Attends meetings of clubs or organizations: Never    Relationship status: Married  Other Topics Concern  . Not on file  Social History Narrative   NO KIDS. MARRIED TO 2ND WIFE.      02/06/2019: Lives with mother at this time assisting  in her care, while wife lives nearby, taking care of her own parents.   Works from home full time, but planning on retiring in next month to focus on health/well-being   Used to work out on treadmill, but has not in recent months   Motivated to return to exercise regimine and better diet control   Enjoys watching basketball, football      Tobacco Counseling Counseling given: Not Answered Comment: Never smoked  Activities of Daily Living In your present state of health, do you have any difficulty performing the following activities: 02/06/2019  Hearing? N  Vision? N  Difficulty concentrating or making decisions? N  Walking or climbing stairs? N  Dressing or bathing? N  Doing errands, shopping? N  Preparing Food and eating ? N  Using the Toilet? N  In the past six months, have you accidently leaked urine? N  Do you have problems with loss of bowel control? N  Managing your Medications? N  Managing your Finances? N  Housekeeping or managing your Housekeeping? N  Some recent data might be hidden     Immunizations and Health Maintenance Immunization History  Administered Date(s) Administered  . Influenza Split 08/04/2012, 08/04/2014, 09/01/2015  . Influenza Whole 08/05/2013  . Influenza-Unspecified 08/18/2018  . Pneumococcal Conjugate-13 02/06/2019  . Zoster 08/05/2013   Health Maintenance Due  Topic Date Due  . HEMOGLOBIN A1C  02-04-1951  . Hepatitis C Screening  10-02-1951  . URINE MICROALBUMIN  07/03/1959    Patient Care Team: Billie Ruddy, MD as PCP - General (Family Medicine) Danie Binder, MD (Gastroenterology) Deterding, Jeneen Rinks, MD as Consulting Physician (Nephrology) Rutherford Guys, MD as Consulting Physician (Ophthalmology) Deneise Lever, MD as Consulting Physician (Pulmonary Disease)  Indicate any recent Medical Services you may have received from other than [facility] providers in the past year (date may be approximate).    Assessment:   This is a routine  wellness examination for [place]. Physical assessment deferred to PCP.   Hearing/Vision screen Hearing Screening Comments: Able to hear conversational tones w/o difficulty. No issues reported.   Declines hearing screen at this time Vision Screening Comments: No acute vision concerns; since cataract surgery this summer has been seeing better. Sees Dr. Gershon Crane routinely.   Dietary issues and exercise activities discussed: Current Exercise Habits: The patient does not participate in regular exercise at present, Exercise limited by: neurologic condition(s);cardiac condition(s);orthopedic condition(s) Diet (meal preparation, eat out, water intake, caffeinated beverages, dairy products, fruits and vegetables): in general, an "unhealthy" diet. Pt. states he has been eating more sugar/carbs lately, which he knows he needs to limit. Pt. Attributes change in diet to stress of job. Information provided on renal diet.       Goals    . Patient Stated     Start going to [facility], retire ASAP! Return to 180lb. Weight by next year!      Depression Screen PHQ 2/9 Scores 02/06/2019 09/11/2018 06/17/2018 02/20/2018  PHQ - 2 Score 0 0 0 0  PHQ- 9 Score 2 - - -    Fall Risk Fall Risk  02/06/2019 09/11/2018 06/17/2018 02/20/2018 11/28/2017  Falls in the past year? 0 No No No No  Risk for fall due to : Medication side effect - - - -     Cognitive Function:       Ad8 score reviewed for issues:  Issues making decisions: no  Less interest in hobbies / activities: no  Repeats questions, stories (family complaining): no  Trouble using ordinary gadgets (microwave, computer, phone):no  Forgets the month or year: no  Mismanaging finances: no  Remembering appts: no  Daily problems with thinking and/or memory: yes Ad8 score is= 1   Screening Tests Health Maintenance  Topic Date Due  . HEMOGLOBIN A1C  July 10, 1951  . Hepatitis C Screening  [birth year]  . URINE MICROALBUMIN  07/03/1959  . TETANUS/TDAP   02/02/2020 (Originally 07/02/1968)  . OPHTHALMOLOGY EXAM  07/05/2019  .  FOOT EXAM  08/05/2019  . PNA vac Low Risk Adult (2 of 2 - PPSV23) 02/06/2020  . COLONOSCOPY  03/02/2027  . INFLUENZA VACCINE  Completed    Qualifies for Shingles Vaccine? Yes, education provided; advised to receive at local pharmacy.      Plan:    Follow up with nephrology as planned   Refer to weight loss/diet tips provided  Good to hear you are keeping your physical/mental health a priority. Staying active and engaged in your interests as you make transition to retirement is key!  I have personally reviewed and noted the following in the patient's chart:   . Medical and social history . Use of alcohol, tobacco or illicit drugs  . Current medications and supplements . Functional ability and status . Nutritional status . Physical activity . Advanced directives . List of other physicians . Hospitalizations, surgeries, and ER visits in previous 12 months . Vitals . Screenings to include cognitive, depression, and falls . Referrals and appointments  In addition, I have reviewed and discussed with patient certain preventive protocols, quality metrics, and best practice recommendations. A written personalized care plan for preventive services as well as general preventive health recommendations were provided to patient.     Alphia Moh, RN   02/06/2019

## 2019-02-06 ENCOUNTER — Ambulatory Visit (INDEPENDENT_AMBULATORY_CARE_PROVIDER_SITE_OTHER): Payer: Medicare Other

## 2019-02-06 VITALS — BP 152/84 | HR 56 | Resp 16 | Ht 69.0 in | Wt 211.0 lb

## 2019-02-06 DIAGNOSIS — Z0000 Encounter for general adult medical examination without abnormal findings: Secondary | ICD-10-CM | POA: Diagnosis not present

## 2019-02-06 DIAGNOSIS — Z23 Encounter for immunization: Secondary | ICD-10-CM | POA: Diagnosis not present

## 2019-02-06 NOTE — Patient Instructions (Addendum)
Follow up with nephrology as planned   Refer to weight loss/diet tips provided  Good to hear you are keeping your physical/mental health a priority. Staying active and engaged in your interests as you make transition to retirement is key!   [name] , Thank you for taking time to come for your Medicare Wellness Visit. I appreciate your ongoing commitment to your health goals. Please review the following plan we discussed and let me know if I can assist you in the future.   These are the goals we discussed: Goals    . Patient Stated     Start going to [facility], retire ASAP! Return to 180lb. Weight by next year!       This is a list of the screening recommended for you and due dates:  Health Maintenance  Topic Date Due  . Hemoglobin A1C  1951-02-18  .  Hepatitis C: One time screening is recommended by Center for Disease Control  (CDC) for  adults born from [birth year] through 1967.   11/28/1951  . Urine Protein Check  07/[birth year]/1959  . Tetanus Vaccine  02/02/2020*  . Eye exam for diabetics  07/05/2019  . Complete foot exam   08/05/2019  . Pneumonia vaccines (2 of 2 - PPSV23) 02/06/2020  . Colon Cancer Screening  03/02/2027  . Flu Shot  Completed  *Topic was postponed. The date shown is not the original due date.    Food Basics for Chronic Kidney Disease When your kidneys are not working well, they cannot remove waste and excess substances from your blood as effectively as they did before. This can lead to a buildup and imbalance of these substances, which can worsen kidney damage and affect how your body functions. Certain foods lead to a buildup of these substances in the body. By changing your diet as recommended by your diet and nutrition specialist (dietitian) or health care provider, you could help prevent further kidney damage and delay or prevent the need for dialysis. What are tips for following this plan? General instructions   Work with your health care provider and dietitian to  develop a meal plan that is right for you. Foods you can eat, limit, or avoid will be different for each person depending on the stage of kidney disease and any other existing health conditions.  Talk with your health care provider about whether you should take a vitamin and mineral supplement.  Use standard measuring cups and spoons to measure servings of foods. Use a kitchen scale to measure portions of protein foods.  If directed by your health care provider, avoid drinking too much fluid. Measure and count all liquids, including water, ice, soups, flavored gelatin, and frozen desserts such as popsicles or ice cream. Reading food labels  Check the amount of sodium in foods. Choose foods that have less than 300 milligrams (mg) per serving.  Check the ingredient list for phosphorus or potassium-based additives or preservatives.  Check the amount of saturated and trans fat. Limit or avoid these fats as told by your dietitian. Shopping  Avoid buying foods that are: ? Processed, frozen, or prepackaged. ? Calcium-enriched or fortified.  Do not buy foods that have salt or sodium listed among the first five ingredients.  Do not buy canned vegetables. Cooking  Replace animal proteins, such as meat, fish, eggs, or dairy, with plant proteins from beans, nuts, and soy. ? Use soy milk instead of cow's milk. ? Add beans or tofu to soups, casseroles, or pasta dishes instead  of meat.  Soak vegetables, such as potatoes, before cooking to reduce potassium. To do this: ? Peel and cut into small pieces. ? Soak in warm water for at least 2 hours. For every 1 cup of vegetables, use 10 cups of water. ? Drain and rinse with warm water. ? Boil for at least 5 minutes. Meal planning  Limit the amount of protein from plant and animal sources you eat each day.  Do not add salt to food when cooking or before eating.  Eat meals and snacks at around the same time each day. If you have diabetes:  If you  have diabetes (diabetes mellitus) and chronic kidney disease, it is important to keep your blood glucose in the target range recommended by your health care provider. Follow your diabetes management plan. This may include: ? Checking your blood glucose regularly. ? Taking oral medicines, insulin, or both. ? Exercising for at least 30 minutes on 5 or more days each week, or as told by your health care provider. ? Tracking how many servings of carbohydrates you eat at each meal.  You may be given specific guidelines on how much of certain foods and nutrients you may eat, depending on your stage of kidney disease and whether you have high blood pressure (hypertension). Follow your meal plan as told by your dietitian. What nutrients should be limited? The items listed are not a complete list. Talk with your dietitian about what dietary choices are best for you. Potassium Potassium affects how steadily your heart beats. If too much potassium builds up in your blood, it can cause an irregular heartbeat or even a heart attack. You may need to eat less potassium, depending on your blood potassium levels and the stage of kidney disease. Talk to your dietitian about how much potassium you may have each day. You may need to limit or avoid foods that are high in potassium, such as:  Milk and soy milk.  Fruits, such as bananas, papaya, apricots, nectarines, melon, prunes, raisins, kiwi, and oranges.  Vegetables, such as potatoes, sweet potatoes, yams, tomatoes, leafy greens, beets, okra, avocado, pumpkin, and winter squash.  White and lima beans. Phosphorus Phosphorus is a mineral found in your bones. A balance between calcium and phosphorous is needed to build and maintain healthy bones. Too much phosphorus pulls calcium from your bones. This can make your bones weak and more likely to break. Too much phosphorus can also make your skin itch. You may need to eat less phosphorus depending on your blood  phosphorus levels and the stage of kidney disease. Talk to your dietitian about how much potassium you may have each day. You may need to take medicine to lower your blood phosphorus levels if diet changes do not help. You may need to limit or avoid foods that are high in phosphorus, such as:  Milk and dairy products.  Dried beans and peas.  Tofu, soy milk, and other soy-based meat replacements.  Colas.  Nuts and peanut butter.  Meat, poultry, and fish.  Bran cereals and oatmeals. Protein Protein helps you to make and keep muscle. It also helps in the repair of your body's cells and tissues. One of the natural breakdown products of protein is a waste product called urea. When your kidneys are not working properly, they cannot remove wastes, such as urea, like they did before you developed chronic kidney disease. Reducing how much protein you eat can help prevent a buildup of urea in your blood. Depending  on your stage of kidney disease, you may need to limit foods that are high in protein. Sources of animal protein include:  Meat (all types).  Fish and seafood.  Poultry.  Eggs.  Dairy. Other protein foods include:  Beans and legumes.  Nuts and nut butter.  Soy and tofu. Sodium Sodium, which is found in salt, helps maintain a healthy balance of fluids in your body. Too much sodium can increase your blood pressure and have a negative effect on the function of your heart and lungs. Too much sodium can also cause your body to retain too much fluid, making your kidneys work harder. Most people should have less than 2,300 milligrams (mg) of sodium each day. If you have hypertension, you may need to limit your sodium to 1,500 mg each day. Talk to your dietitian about how much sodium you may have each day. You may need to limit or avoid foods that are high in sodium, such as:  Salt seasonings.  Soy sauce.  Cured and processed meats.  Salted crackers and snack foods.  Fast  food.  Canned soups and most canned foods.  Pickled foods.  Vegetable juice.  Boxed mixes or ready-to-eat boxed meals and side dishes.  Bottled dressings, sauces, and marinades. Summary  Chronic kidney disease can lead to a buildup and imbalance of waste and excess substances in the body. Certain foods lead to a buildup of these substances. By adjusting your intake of these foods, you could help prevent more kidney damage and delay or prevent the need for dialysis.  Food adjustments are different for each person with chronic kidney disease. Work with a dietitian to set up nutrient goals and a meal plan that is right for you.  If you have diabetes and chronic kidney disease, it is important to keep your blood glucose in the target range recommended by your health care provider. This information is not intended to replace advice given to you by your health care provider. Make sure you discuss any questions you have with your health care provider. Document Released: 02/10/2003 Document Revised: 11/15/2016 Document Reviewed: 11/15/2016 Elsevier Interactive Patient Education  2019 [place] Maintenance, Male A healthy lifestyle and preventive care is important for your health and wellness. Ask your health care provider about what schedule of regular examinations is right for you. What should I know about weight and diet? Eat a Healthy Diet  Eat plenty of vegetables, fruits, whole grains, low-fat dairy products, and lean protein.  Do not eat a lot of foods high in solid fats, added sugars, or salt.  Maintain a Healthy Weight Regular exercise can help you achieve or maintain a healthy weight. You should:  Do at least 150 minutes of exercise each week. The exercise should increase your heart rate and make you sweat (moderate-intensity exercise).  Do strength-training exercises at least twice a week. Watch Your Levels of Cholesterol and Blood Lipids  Have your blood  tested for lipids and cholesterol every 5 years starting at 70 years of age. If you are at high risk for heart disease, you should start having your blood tested when you are 70 years old. You may need to have your cholesterol levels checked more often if: ? Your lipid or cholesterol levels are high. ? You are older than 71 years of age. ? You are at high risk for heart disease. What should I know about cancer screening? Many types of cancers can be detected early and may often  be prevented. Lung Cancer  You should be screened every year for lung cancer if: ? You are a current smoker who has smoked for at least 30 years. ? You are a former smoker who has quit within the past 15 years.  Talk to your health care provider about your screening options, when you should start screening, and how often you should be screened. Colorectal Cancer  Routine colorectal cancer screening usually begins at 70 years of age and should be repeated every 5-10 years until you are 70 years old. You may need to be screened more often if early forms of precancerous polyps or small growths are found. Your health care provider may recommend screening at an earlier age if you have risk factors for colon cancer.  Your health care provider may recommend using home test kits to check for hidden blood in the stool.  A small camera at the end of a tube can be used to examine your colon (sigmoidoscopy or colonoscopy). This checks for the earliest forms of colorectal cancer. Prostate and Testicular Cancer  Depending on your age and overall health, your health care provider may do certain tests to screen for prostate and testicular cancer.  Talk to your health care provider about any symptoms or concerns you have about testicular or prostate cancer. Skin Cancer  Check your skin from head to toe regularly.  Tell your health care provider about any new moles or changes in moles, especially if: ? There is a change in a  mole's size, shape, or color. ? You have a mole that is larger than a pencil eraser.  Always use sunscreen. Apply sunscreen liberally and repeat throughout the day.  Protect yourself by wearing long sleeves, pants, a wide-brimmed hat, and sunglasses when outside. What should I know about heart disease, diabetes, and high blood pressure?  If you are 32-12 years of age, have your blood pressure checked every 3-5 years. If you are 94 years of age or older, have your blood pressure checked every year. You should have your blood pressure measured twice-once when you are at a hospital or clinic, and once when you are not at a hospital or clinic. Record the average of the two measurements. To check your blood pressure when you are not at a hospital or clinic, you can use: ? An automated blood pressure machine at a pharmacy. ? A home blood pressure monitor.  Talk to your health care provider about your target blood pressure.  If you are between 43-16 years old, ask your health care provider if you should take aspirin to prevent heart disease.  Have regular diabetes screenings by checking your fasting blood sugar level. ? If you are at a normal weight and have a low risk for diabetes, have this test once every three years after the age of 72. ? If you are overweight and have a high risk for diabetes, consider being tested at a younger age or more often.  A one-time screening for abdominal aortic aneurysm (AAA) by ultrasound is recommended for men aged 38-75 years who are current or former smokers. What should I know about preventing infection? Hepatitis B If you have a higher risk for hepatitis B, you should be screened for this virus. Talk with your health care provider to find out if you are at risk for hepatitis B infection. Hepatitis C Blood testing is recommended for:  Everyone born from [birth year] through 1965.  Anyone with known risk factors for hepatitis C. Sexually  Transmitted Diseases  (STDs)  You should be screened each year for STDs including gonorrhea and chlamydia if: ? You are sexually active and are younger than 70 years of age. ? You are older than 70 years of age and your health care provider tells you that you are at risk for this type of infection. ? Your sexual activity has changed since you were last screened and you are at an increased risk for chlamydia or gonorrhea. Ask your health care provider if you are at risk.  Talk with your health care provider about whether you are at high risk of being infected with HIV. Your health care provider may recommend a prescription medicine to help prevent HIV infection. What else can I do?  Schedule regular health, dental, and eye exams.  Stay current with your vaccines (immunizations).  Do not use any tobacco products, such as cigarettes, chewing tobacco, and e-cigarettes. If you need help quitting, ask your health care provider.  Limit alcohol intake to no more than 2 drinks per day. One drink equals 12 ounces of beer, 5 ounces of wine, or 1 ounces of hard liquor.  Do not use street drugs.  Do not share needles.  Ask your health care provider for help if you need support or information about quitting drugs.  Tell your health care provider if you often feel depressed.  Tell your health care provider if you have ever been abused or do not feel safe at home. This information is not intended to replace advice given to you by your health care provider. Make sure you discuss any questions you have with your health care provider. Document Released: 05/18/2008 Document Revised: 07/19/2016 Document Reviewed: 08/24/2015 Elsevier Interactive Patient Education  2019 [employer].

## 2019-02-07 DIAGNOSIS — M9901 Segmental and somatic dysfunction of cervical region: Secondary | ICD-10-CM | POA: Diagnosis not present

## 2019-02-07 DIAGNOSIS — S335XXA Sprain of ligaments of lumbar spine, initial encounter: Secondary | ICD-10-CM | POA: Diagnosis not present

## 2019-02-07 DIAGNOSIS — M4004 Postural kyphosis, thoracic region: Secondary | ICD-10-CM | POA: Diagnosis not present

## 2019-02-07 DIAGNOSIS — M9902 Segmental and somatic dysfunction of thoracic region: Secondary | ICD-10-CM | POA: Diagnosis not present

## 2019-02-07 DIAGNOSIS — S134XXA Sprain of ligaments of cervical spine, initial encounter: Secondary | ICD-10-CM | POA: Diagnosis not present

## 2019-02-07 DIAGNOSIS — M546 Pain in thoracic spine: Secondary | ICD-10-CM | POA: Diagnosis not present

## 2019-02-07 DIAGNOSIS — M9903 Segmental and somatic dysfunction of lumbar region: Secondary | ICD-10-CM | POA: Diagnosis not present

## 2019-02-09 ENCOUNTER — Other Ambulatory Visit: Payer: Self-pay | Admitting: Internal Medicine

## 2019-02-11 NOTE — Progress Notes (Signed)
Medical screening examination/treatment was performed by qualified clinical staff member and as supervising physician I was immediately available for consultation/collaboration. I have reviewed documentation and agree with assessment and plan.  Billie Ruddy, MD

## 2019-02-14 DIAGNOSIS — M4004 Postural kyphosis, thoracic region: Secondary | ICD-10-CM | POA: Diagnosis not present

## 2019-02-14 DIAGNOSIS — S134XXA Sprain of ligaments of cervical spine, initial encounter: Secondary | ICD-10-CM | POA: Diagnosis not present

## 2019-02-14 DIAGNOSIS — M546 Pain in thoracic spine: Secondary | ICD-10-CM | POA: Diagnosis not present

## 2019-02-14 DIAGNOSIS — M9901 Segmental and somatic dysfunction of cervical region: Secondary | ICD-10-CM | POA: Diagnosis not present

## 2019-02-14 DIAGNOSIS — M9903 Segmental and somatic dysfunction of lumbar region: Secondary | ICD-10-CM | POA: Diagnosis not present

## 2019-02-14 DIAGNOSIS — S335XXA Sprain of ligaments of lumbar spine, initial encounter: Secondary | ICD-10-CM | POA: Diagnosis not present

## 2019-02-14 DIAGNOSIS — M9902 Segmental and somatic dysfunction of thoracic region: Secondary | ICD-10-CM | POA: Diagnosis not present

## 2019-02-22 ENCOUNTER — Other Ambulatory Visit: Payer: Self-pay | Admitting: Family Medicine

## 2019-02-22 DIAGNOSIS — M545 Low back pain, unspecified: Secondary | ICD-10-CM

## 2019-02-24 NOTE — Telephone Encounter (Signed)
Pt LOV and last refill were on 01/27/2019, please advise if ok for refill

## 2019-03-05 ENCOUNTER — Ambulatory Visit: Payer: Medicare Other | Admitting: Internal Medicine

## 2019-03-21 DIAGNOSIS — M9901 Segmental and somatic dysfunction of cervical region: Secondary | ICD-10-CM | POA: Diagnosis not present

## 2019-03-21 DIAGNOSIS — S335XXA Sprain of ligaments of lumbar spine, initial encounter: Secondary | ICD-10-CM | POA: Diagnosis not present

## 2019-03-21 DIAGNOSIS — S134XXA Sprain of ligaments of cervical spine, initial encounter: Secondary | ICD-10-CM | POA: Diagnosis not present

## 2019-03-21 DIAGNOSIS — M4004 Postural kyphosis, thoracic region: Secondary | ICD-10-CM | POA: Diagnosis not present

## 2019-03-21 DIAGNOSIS — M546 Pain in thoracic spine: Secondary | ICD-10-CM | POA: Diagnosis not present

## 2019-03-21 DIAGNOSIS — M9902 Segmental and somatic dysfunction of thoracic region: Secondary | ICD-10-CM | POA: Diagnosis not present

## 2019-03-21 DIAGNOSIS — M9903 Segmental and somatic dysfunction of lumbar region: Secondary | ICD-10-CM | POA: Diagnosis not present

## 2019-03-27 ENCOUNTER — Other Ambulatory Visit: Payer: Self-pay | Admitting: Family Medicine

## 2019-03-27 DIAGNOSIS — M545 Low back pain, unspecified: Secondary | ICD-10-CM

## 2019-03-27 NOTE — Telephone Encounter (Signed)
Last OV was 01/27/2019 and last refill done on 02/24/19 for 30 tablets, ok for refill

## 2019-03-28 DIAGNOSIS — M9901 Segmental and somatic dysfunction of cervical region: Secondary | ICD-10-CM | POA: Diagnosis not present

## 2019-03-28 DIAGNOSIS — M546 Pain in thoracic spine: Secondary | ICD-10-CM | POA: Diagnosis not present

## 2019-03-28 DIAGNOSIS — M4004 Postural kyphosis, thoracic region: Secondary | ICD-10-CM | POA: Diagnosis not present

## 2019-03-28 DIAGNOSIS — M9903 Segmental and somatic dysfunction of lumbar region: Secondary | ICD-10-CM | POA: Diagnosis not present

## 2019-03-28 DIAGNOSIS — M9902 Segmental and somatic dysfunction of thoracic region: Secondary | ICD-10-CM | POA: Diagnosis not present

## 2019-03-28 DIAGNOSIS — S335XXA Sprain of ligaments of lumbar spine, initial encounter: Secondary | ICD-10-CM | POA: Diagnosis not present

## 2019-03-28 DIAGNOSIS — S134XXA Sprain of ligaments of cervical spine, initial encounter: Secondary | ICD-10-CM | POA: Diagnosis not present

## 2019-04-04 DIAGNOSIS — M9903 Segmental and somatic dysfunction of lumbar region: Secondary | ICD-10-CM | POA: Diagnosis not present

## 2019-04-04 DIAGNOSIS — M4004 Postural kyphosis, thoracic region: Secondary | ICD-10-CM | POA: Diagnosis not present

## 2019-04-04 DIAGNOSIS — S335XXA Sprain of ligaments of lumbar spine, initial encounter: Secondary | ICD-10-CM | POA: Diagnosis not present

## 2019-04-04 DIAGNOSIS — M9902 Segmental and somatic dysfunction of thoracic region: Secondary | ICD-10-CM | POA: Diagnosis not present

## 2019-04-04 DIAGNOSIS — S134XXA Sprain of ligaments of cervical spine, initial encounter: Secondary | ICD-10-CM | POA: Diagnosis not present

## 2019-04-04 DIAGNOSIS — M9901 Segmental and somatic dysfunction of cervical region: Secondary | ICD-10-CM | POA: Diagnosis not present

## 2019-04-04 DIAGNOSIS — M546 Pain in thoracic spine: Secondary | ICD-10-CM | POA: Diagnosis not present

## 2019-04-11 DIAGNOSIS — S335XXA Sprain of ligaments of lumbar spine, initial encounter: Secondary | ICD-10-CM | POA: Diagnosis not present

## 2019-04-11 DIAGNOSIS — M4004 Postural kyphosis, thoracic region: Secondary | ICD-10-CM | POA: Diagnosis not present

## 2019-04-11 DIAGNOSIS — M9901 Segmental and somatic dysfunction of cervical region: Secondary | ICD-10-CM | POA: Diagnosis not present

## 2019-04-11 DIAGNOSIS — M9903 Segmental and somatic dysfunction of lumbar region: Secondary | ICD-10-CM | POA: Diagnosis not present

## 2019-04-11 DIAGNOSIS — S134XXA Sprain of ligaments of cervical spine, initial encounter: Secondary | ICD-10-CM | POA: Diagnosis not present

## 2019-04-11 DIAGNOSIS — M546 Pain in thoracic spine: Secondary | ICD-10-CM | POA: Diagnosis not present

## 2019-04-11 DIAGNOSIS — M9902 Segmental and somatic dysfunction of thoracic region: Secondary | ICD-10-CM | POA: Diagnosis not present

## 2019-04-18 DIAGNOSIS — M546 Pain in thoracic spine: Secondary | ICD-10-CM | POA: Diagnosis not present

## 2019-04-18 DIAGNOSIS — M9901 Segmental and somatic dysfunction of cervical region: Secondary | ICD-10-CM | POA: Diagnosis not present

## 2019-04-18 DIAGNOSIS — S134XXA Sprain of ligaments of cervical spine, initial encounter: Secondary | ICD-10-CM | POA: Diagnosis not present

## 2019-04-18 DIAGNOSIS — M9902 Segmental and somatic dysfunction of thoracic region: Secondary | ICD-10-CM | POA: Diagnosis not present

## 2019-04-18 DIAGNOSIS — S335XXA Sprain of ligaments of lumbar spine, initial encounter: Secondary | ICD-10-CM | POA: Diagnosis not present

## 2019-04-18 DIAGNOSIS — M4004 Postural kyphosis, thoracic region: Secondary | ICD-10-CM | POA: Diagnosis not present

## 2019-04-18 DIAGNOSIS — M9903 Segmental and somatic dysfunction of lumbar region: Secondary | ICD-10-CM | POA: Diagnosis not present

## 2019-04-29 DIAGNOSIS — E669 Obesity, unspecified: Secondary | ICD-10-CM | POA: Diagnosis not present

## 2019-04-29 DIAGNOSIS — D631 Anemia in chronic kidney disease: Secondary | ICD-10-CM | POA: Diagnosis not present

## 2019-04-29 DIAGNOSIS — N184 Chronic kidney disease, stage 4 (severe): Secondary | ICD-10-CM | POA: Diagnosis not present

## 2019-04-29 DIAGNOSIS — Z9989 Dependence on other enabling machines and devices: Secondary | ICD-10-CM | POA: Diagnosis not present

## 2019-04-29 DIAGNOSIS — Z8739 Personal history of other diseases of the musculoskeletal system and connective tissue: Secondary | ICD-10-CM | POA: Diagnosis not present

## 2019-04-29 DIAGNOSIS — Z87442 Personal history of urinary calculi: Secondary | ICD-10-CM | POA: Diagnosis not present

## 2019-04-29 DIAGNOSIS — M1990 Unspecified osteoarthritis, unspecified site: Secondary | ICD-10-CM | POA: Diagnosis not present

## 2019-04-29 DIAGNOSIS — I129 Hypertensive chronic kidney disease with stage 1 through stage 4 chronic kidney disease, or unspecified chronic kidney disease: Secondary | ICD-10-CM | POA: Diagnosis not present

## 2019-04-29 DIAGNOSIS — G4733 Obstructive sleep apnea (adult) (pediatric): Secondary | ICD-10-CM | POA: Diagnosis not present

## 2019-04-29 DIAGNOSIS — N2581 Secondary hyperparathyroidism of renal origin: Secondary | ICD-10-CM | POA: Diagnosis not present

## 2019-04-29 DIAGNOSIS — E1122 Type 2 diabetes mellitus with diabetic chronic kidney disease: Secondary | ICD-10-CM | POA: Diagnosis not present

## 2019-05-13 ENCOUNTER — Ambulatory Visit: Payer: Medicare Other | Admitting: Internal Medicine

## 2019-05-20 ENCOUNTER — Other Ambulatory Visit: Payer: Self-pay

## 2019-05-20 ENCOUNTER — Encounter: Payer: Self-pay | Admitting: Internal Medicine

## 2019-05-20 ENCOUNTER — Ambulatory Visit (INDEPENDENT_AMBULATORY_CARE_PROVIDER_SITE_OTHER): Payer: Medicare Other | Admitting: Internal Medicine

## 2019-05-20 DIAGNOSIS — G4733 Obstructive sleep apnea (adult) (pediatric): Secondary | ICD-10-CM | POA: Diagnosis not present

## 2019-05-20 DIAGNOSIS — F5101 Primary insomnia: Secondary | ICD-10-CM | POA: Diagnosis not present

## 2019-05-20 MED ORDER — TRAZODONE HCL 50 MG PO TABS: 50.0000 mg | ORAL_TABLET | Freq: Every day | ORAL | 5 refills | Status: DC

## 2019-05-20 NOTE — Assessment & Plan Note (Signed)
Discussed sleep hygiene, need not to give up regular sleep schedule when he retires. Plan- try changing from ambien to trazodone for occasional use if needed.

## 2019-05-20 NOTE — Progress Notes (Signed)
HPI M never smoker followed for OSA, complicated by GERD NPSG 04/15/13- moderate obstructive sleep apnea-AHI 22.5 per hour. CPAP 10 CWP. Weight 198 pounds.   --------------------------------------------------------------------------------- 01/10/18- 68 year old male never smoker followed for OSA, insomnia, complicated by GERD ----OSA; DME [facility]. Pt wears CPAP nighlty and DL attached. No new supplies needed and pressure works well. CPAP auto 8-20/ Advanced Ambien 5 mg Treated at [facility] for sinusitis with Z pak.  Asks referral to primary care to establish. Download 97% compliance, AHI 3.9/hour.  Sleeps much better with CPAP.  Bought So Clean machine and it gives him peace of mind.  05/20/2019- 68 year old male never smoker followed for OSA, insomnia, complicated by GERD, CKD4 CPAP auto 8-20/ [employer]   No DL available -----OSA, CPAP, QJJ:HERDE. Patient reports he uses nightly with no complaints.  Body weight today 207 lbs He sleeps much better with CPAP, used as directed. About to retire, anticipating easier sleeping, but does have trouble initiating sleep. Had used ambien occasionally. Sleep hygiene discussed.   ROS-see HPI + = positive Constitutional:   No-   weight loss, night sweats, fevers, chills, +fatigue, lassitude. HEENT:   No-  headaches, difficulty swallowing, tooth/dental problems, sore throat,       No-  sneezing, itching, ear ache, nasal congestion, +post nasal drip,  CV:  No-   chest pain, orthopnea, PND, swelling in lower extremities, anasarca, dizziness, palpitations Resp: No-   shortness of breath with exertion or at rest.              No-   productive cough,  No non-productive cough,  No- coughing up of blood.              No-   change in color of mucus.  No- wheezing.   Skin: No-   rash or lesions. GI:  No-   heartburn, indigestion, abdominal pain, nausea, vomiting,  GU:  MS:  No-   joint pain or swelling.   Neuro-     nothing unusual Psych:  No- change in mood or affect. No  depression or anxiety.  No memory loss.  OBJ- Physical Exam General- Alert, Oriented, Affect-appropriate, Distress- none acute,  Skin- rash-none, lesions- none, excoriation- none Lymphadenopathy- none Head- atraumatic            Eyes- Gross vision intact, PERRLA, conjunctivae and secretions clear            Ears- Hearing, canals-normal            Nose-  sniffing,  turbinate edema, no-Septal dev, mucus, polyps, erosion, perforation             Throat- Mallampati III-IV , mucosa clear , drainage- none, tonsils- atrophic Neck- flexible , trachea midline, no stridor , thyroid nl, carotid no bruit Chest - symmetrical excursion , unlabored           Heart/CV- RRR , no murmur heard , no gallop  , no rub, nl s1 s2                           - JVD- none , edema- none, stasis changes- none, varices- none           Lung- clear to P&A, wheeze- none, cough- none , dullness-none, rub- none           Chest wall-  Abd-  Br/ Gen/ Rectal- Not done, not indicated Extrem- cyanosis- none, clubbing, none, atrophy- none, strength- nl Neuro- grossly  intact to observation

## 2019-05-20 NOTE — Assessment & Plan Note (Signed)
He continues to benefit from CPAP and usess it aall night, every night. Plan- continue CPAP auto 8-20, mask of choice, supplies, humidifier, AirView/ card

## 2019-05-20 NOTE — Patient Instructions (Signed)
We can continue CPAP auto 8-20, mask of choice, humidifier, supplies, AirView/ card  Script sent to change your sleep medicine to Trazodone. Try 1/2 or 1 tab for sleep if needed.  Please call if we can help

## 2019-05-22 ENCOUNTER — Other Ambulatory Visit: Payer: Self-pay | Admitting: Family Medicine

## 2019-05-22 DIAGNOSIS — M545 Low back pain, unspecified: Secondary | ICD-10-CM

## 2019-05-30 DIAGNOSIS — S335XXA Sprain of ligaments of lumbar spine, initial encounter: Secondary | ICD-10-CM | POA: Diagnosis not present

## 2019-05-30 DIAGNOSIS — M9901 Segmental and somatic dysfunction of cervical region: Secondary | ICD-10-CM | POA: Diagnosis not present

## 2019-05-30 DIAGNOSIS — M546 Pain in thoracic spine: Secondary | ICD-10-CM | POA: Diagnosis not present

## 2019-05-30 DIAGNOSIS — M9903 Segmental and somatic dysfunction of lumbar region: Secondary | ICD-10-CM | POA: Diagnosis not present

## 2019-05-30 DIAGNOSIS — M4004 Postural kyphosis, thoracic region: Secondary | ICD-10-CM | POA: Diagnosis not present

## 2019-05-30 DIAGNOSIS — S134XXA Sprain of ligaments of cervical spine, initial encounter: Secondary | ICD-10-CM | POA: Diagnosis not present

## 2019-05-30 DIAGNOSIS — M9902 Segmental and somatic dysfunction of thoracic region: Secondary | ICD-10-CM | POA: Diagnosis not present

## 2019-06-10 ENCOUNTER — Ambulatory Visit: Payer: Medicare Other | Admitting: Internal Medicine

## 2019-06-11 ENCOUNTER — Other Ambulatory Visit: Payer: Self-pay | Admitting: Internal Medicine

## 2019-07-07 DIAGNOSIS — M79671 Pain in right foot: Secondary | ICD-10-CM | POA: Diagnosis not present

## 2019-07-07 DIAGNOSIS — E1140 Type 2 diabetes mellitus with diabetic neuropathy, unspecified: Secondary | ICD-10-CM | POA: Diagnosis not present

## 2019-07-07 DIAGNOSIS — M79672 Pain in left foot: Secondary | ICD-10-CM | POA: Diagnosis not present

## 2019-07-11 ENCOUNTER — Other Ambulatory Visit: Payer: Self-pay | Admitting: Internal Medicine

## 2019-07-11 NOTE — Telephone Encounter (Signed)
Trazodone refill e-ent

## 2019-07-11 NOTE — Telephone Encounter (Signed)
Is this appropriate ? Please advise

## 2019-07-22 ENCOUNTER — Other Ambulatory Visit: Payer: Self-pay | Admitting: Family Medicine

## 2019-07-22 DIAGNOSIS — M545 Low back pain, unspecified: Secondary | ICD-10-CM

## 2019-07-22 NOTE — Telephone Encounter (Signed)
Dr. Banks please advise  

## 2019-08-04 DIAGNOSIS — M79671 Pain in right foot: Secondary | ICD-10-CM | POA: Diagnosis not present

## 2019-08-04 DIAGNOSIS — M25579 Pain in unspecified ankle and joints of unspecified foot: Secondary | ICD-10-CM | POA: Diagnosis not present

## 2019-08-04 DIAGNOSIS — M79672 Pain in left foot: Secondary | ICD-10-CM | POA: Diagnosis not present

## 2019-08-13 DIAGNOSIS — E1122 Type 2 diabetes mellitus with diabetic chronic kidney disease: Secondary | ICD-10-CM | POA: Diagnosis not present

## 2019-08-13 DIAGNOSIS — Z1159 Encounter for screening for other viral diseases: Secondary | ICD-10-CM | POA: Diagnosis not present

## 2019-08-13 DIAGNOSIS — E669 Obesity, unspecified: Secondary | ICD-10-CM | POA: Diagnosis not present

## 2019-08-13 DIAGNOSIS — N2581 Secondary hyperparathyroidism of renal origin: Secondary | ICD-10-CM | POA: Diagnosis not present

## 2019-08-13 DIAGNOSIS — Z9989 Dependence on other enabling machines and devices: Secondary | ICD-10-CM | POA: Diagnosis not present

## 2019-08-13 DIAGNOSIS — Z23 Encounter for immunization: Secondary | ICD-10-CM | POA: Diagnosis not present

## 2019-08-13 DIAGNOSIS — Z8739 Personal history of other diseases of the musculoskeletal system and connective tissue: Secondary | ICD-10-CM | POA: Diagnosis not present

## 2019-08-13 DIAGNOSIS — D631 Anemia in chronic kidney disease: Secondary | ICD-10-CM | POA: Diagnosis not present

## 2019-08-13 DIAGNOSIS — N184 Chronic kidney disease, stage 4 (severe): Secondary | ICD-10-CM | POA: Diagnosis not present

## 2019-08-13 DIAGNOSIS — G4733 Obstructive sleep apnea (adult) (pediatric): Secondary | ICD-10-CM | POA: Diagnosis not present

## 2019-08-13 DIAGNOSIS — I129 Hypertensive chronic kidney disease with stage 1 through stage 4 chronic kidney disease, or unspecified chronic kidney disease: Secondary | ICD-10-CM | POA: Diagnosis not present

## 2019-08-13 DIAGNOSIS — Z87442 Personal history of urinary calculi: Secondary | ICD-10-CM | POA: Diagnosis not present

## 2019-09-02 DIAGNOSIS — I129 Hypertensive chronic kidney disease with stage 1 through stage 4 chronic kidney disease, or unspecified chronic kidney disease: Secondary | ICD-10-CM | POA: Diagnosis not present

## 2019-09-05 DIAGNOSIS — R350 Frequency of micturition: Secondary | ICD-10-CM | POA: Diagnosis not present

## 2019-09-05 DIAGNOSIS — N401 Enlarged prostate with lower urinary tract symptoms: Secondary | ICD-10-CM | POA: Diagnosis not present

## 2019-09-05 DIAGNOSIS — E291 Testicular hypofunction: Secondary | ICD-10-CM | POA: Diagnosis not present

## 2019-09-05 DIAGNOSIS — N5201 Erectile dysfunction due to arterial insufficiency: Secondary | ICD-10-CM | POA: Diagnosis not present

## 2019-09-13 ENCOUNTER — Other Ambulatory Visit: Payer: Self-pay | Admitting: Family Medicine

## 2019-09-13 DIAGNOSIS — I10 Essential (primary) hypertension: Secondary | ICD-10-CM

## 2019-09-16 ENCOUNTER — Telehealth: Payer: Self-pay

## 2019-09-16 NOTE — Telephone Encounter (Signed)
Refill request received from [facility], for Famotidine 40 mg #30 1 tab po daily with breakfast.

## 2019-09-17 ENCOUNTER — Other Ambulatory Visit: Payer: Self-pay | Admitting: Gastroenterology

## 2019-09-17 DIAGNOSIS — K219 Gastro-esophageal reflux disease without esophagitis: Secondary | ICD-10-CM

## 2019-09-17 MED ORDER — FAMOTIDINE 40 MG PO TABS: 40.0000 mg | ORAL_TABLET | Freq: Every day | ORAL | 3 refills | Status: DC

## 2019-09-17 NOTE — Telephone Encounter (Signed)
Noted. Pt notified and has scheduled a f/u appointment.

## 2019-09-17 NOTE — Telephone Encounter (Signed)
Prescription sent to pharmacy.  I wrote for a limited supply as patient has not been seen in our office since 2017.  Patient will need to schedule follow-up appointment for further refills.

## 2019-10-16 DIAGNOSIS — Z961 Presence of intraocular lens: Secondary | ICD-10-CM | POA: Diagnosis not present

## 2019-10-17 DIAGNOSIS — N5201 Erectile dysfunction due to arterial insufficiency: Secondary | ICD-10-CM | POA: Diagnosis not present

## 2019-10-17 DIAGNOSIS — E291 Testicular hypofunction: Secondary | ICD-10-CM | POA: Diagnosis not present

## 2019-10-17 DIAGNOSIS — R351 Nocturia: Secondary | ICD-10-CM | POA: Diagnosis not present

## 2019-10-17 DIAGNOSIS — N401 Enlarged prostate with lower urinary tract symptoms: Secondary | ICD-10-CM | POA: Diagnosis not present

## 2019-10-28 ENCOUNTER — Telehealth: Payer: Self-pay | Admitting: Orthopedic Surgery

## 2019-10-28 NOTE — Telephone Encounter (Signed)
Patient called (returned call per his voice message) - inquiring about seeing Dr Aline Brochure for his back and bilateral leg pain, 'chronic problem.'  Relayed primary care as our clinic does not treat chronic back pain per Dr Ruthe Mannan protocol. Michela Pitcher will re-check with primary care to possibly see for this problem, or refer to back specialist.

## 2019-10-29 ENCOUNTER — Telehealth: Payer: Self-pay | Admitting: Internal Medicine

## 2019-10-29 NOTE — Telephone Encounter (Signed)
I actually have 2 copies but Dr. Annamaria Boots will not be back in office until 12/01 Tuesday

## 2019-10-29 NOTE — Telephone Encounter (Signed)
Rodena Piety please advise if you've received a CMN for this patient, or if we need to re-request this.  Thanks!

## 2019-11-03 NOTE — Telephone Encounter (Signed)
Noted. Will await return of CY.

## 2019-11-05 ENCOUNTER — Ambulatory Visit (INDEPENDENT_AMBULATORY_CARE_PROVIDER_SITE_OTHER): Payer: Medicare Other | Admitting: Gastroenterology

## 2019-11-05 ENCOUNTER — Encounter: Payer: Self-pay | Admitting: Gastroenterology

## 2019-11-05 ENCOUNTER — Other Ambulatory Visit: Payer: Self-pay

## 2019-11-05 DIAGNOSIS — K648 Other hemorrhoids: Secondary | ICD-10-CM | POA: Diagnosis not present

## 2019-11-05 DIAGNOSIS — K219 Gastro-esophageal reflux disease without esophagitis: Secondary | ICD-10-CM

## 2019-11-05 DIAGNOSIS — Z86010 Personal history of colonic polyps: Secondary | ICD-10-CM | POA: Diagnosis not present

## 2019-11-05 NOTE — Progress Notes (Signed)
ON RECALL  °

## 2019-11-05 NOTE — Progress Notes (Signed)
Subjective:    Patient ID: Bruce Gerold., male    DOB: 1951-02-18, 68 y.o.   MRN: [id number]  Billie Ruddy, MD  HPI PT DOING FAIRLY WELL. No questions or concerns. WAS WORKING FROM HOME NOW RETIRED OCT 2020. 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 RARE SUBJECTIVE CHILLS. OCCASIONAL LEFT FLANK/RIB PAIN. PEPCID CONTROLLING HEARTBURN. FEEL SSUGAR UP AND HURT HIS BACK. BM: 1X/DAY.   PT DENIES FEVER,HEMATOCHEZIA, HEMATEMESIS, nausea, vomiting, melena, diarrhea, CHEST PAIN, SHORTNESS OF BREATH,  CHANGE IN BOWEL IN HABITS, constipation, abdominal pain, problems swallowing, OR heartburn or indigestion.    Past Medical History:  Diagnosis Date  . Adenoma 11/2009   1.1 cm from TCS  . DM (diabetes mellitus) ([place])   . GERD (gastroesophageal reflux disease)   . High cholesterol   . HTN (hypertension)   . Kidney stones   . Obesity (BMI 30-39.9) DEC 2011 223 LBS  . Rectal bleeding 2011   secondary to hemorrhoids    Past Surgical History:  Procedure Laterality Date  . CIRCUMCISION    . COLONOSCOPY  Dec 2010 [facility]   MOD IH, SIMPLE ADENOMA 1.1 CM  . COLONOSCOPY  11/25/2012   SLF: 1. Sessile polyp measuring 40mm in size was found at the hepatic flesure; polypectomy was performed using snare cautery 2. Moderate diverticulosis was noted in the ascending colon and sigmoid colon. 3. The colon mucosa was otherwise normal 4. Large internal hemorrhoids.   . COLONOSCOPY N/A 03/01/2017   Procedure: COLONOSCOPY;  Surgeon: Danie Binder, MD;  Location: [facility];  Service: Endoscopy;  Laterality: N/A;  10:30 AM  . EYE SURGERY  05/2018   bilateral cataract surgery  . HERNIA REPAIR      Allergies  Allergen Reactions  . Dye Fdc Blue [Brilliant Blue Fcf (Fd&C Blue #1)]     PT UNSURE WHICH DYE  . Iodinated Diagnostic Agents Hives    30 YEARS AGO    Current Outpatient Medications  Medication Sig    . acetaminophen (TYLENOL) 500 MG tablet Take 1,000 mg by mouth 2 (two) times  daily as needed for headache.    . allopurinol (ZYLOPRIM) 100 MG tablet Take 300 mg by mouth daily.     Marland Kitchen amLODipine (NORVASC) 10 MG tablet Take 10 mg by mouth daily.    . calcitRIOL (ROCALTROL) 0.25 MCG capsule Take 0.25 mcg by mouth at bedtime.     . clomiPHENE (CLOMID) 50 MG tablet Take 25 mg by mouth at bedtime.     . cyclobenzaprine (FLEXERIL) 5 MG tablet TAKE 1 TABLET BY MOUTH AT BEDTIME AS NEEDED FOR MUSCLE SPASMS.    . famciclovir (FAMVIR) 500 MG tablet Take 1,000 mg by mouth 2 (two) times daily as needed (fever blisters).     . famotidine (PEPCID) 40 MG tablet Take 1 tablet (40 mg total) by mouth daily. WITH BREAKFAST    . fluticasone (FLONASE) 50 MCG/ACT nasal spray Place 1 spray into both nostrils daily as needed for allergies or rhinitis.    . furosemide (LASIX) 40 MG tablet Take 40 mg by mouth.    . hydrALAZINE (APRESOLINE) 25 MG tablet Take 25 mg by mouth 3 (three) times daily.    . propranolol (INDERAL) 80 MG tablet TAKE 1 TABLET (80 MG TOTAL) BY MOUTH 2 (TWO) TIMES DAILY.    Marland Kitchen psyllium (METAMUCIL) 58.6 % powder Take 1 packet by mouth daily. As needed for constipation    . vitamin C (ASCORBIC ACID) 500  MG tablet Take 500 mg by mouth 2 (two) times a week.     . felodipine (PLENDIL) 10 MG 24 hr tablet Take 10 mg by mouth daily.    .      .       Review of Systems PER HPI OTHERWISE ALL SYSTEMS ARE NEGATIVE.    Objective:   Physical Exam Constitutional:      General: He is not in acute distress.    Appearance: Normal appearance.  HENT:     Mouth/Throat:     Comments: MASK IN PLACE Eyes:     General: No scleral icterus.    Pupils: Pupils are equal, round, and reactive to light.  Neck:     Musculoskeletal: Normal range of motion.  Cardiovascular:     Rate and Rhythm: Normal rate and regular rhythm.     Pulses: Normal pulses.     Heart sounds: Normal heart sounds.  Pulmonary:     Effort: Pulmonary effort is normal.     Breath sounds: Normal breath sounds.  Abdominal:      General: Bowel sounds are normal.     Palpations: Abdomen is soft.     Tenderness: There is no abdominal tenderness.  Musculoskeletal:     Right lower leg: No edema.     Left lower leg: No edema.  Lymphadenopathy:     Cervical: No cervical adenopathy.  Skin:    General: Skin is warm and dry.  Neurological:     Mental Status: He is alert and oriented to person, place, and time.     Comments: NO  NEW FOCAL DEFICITS  Psychiatric:        Mood and Affect: Mood normal.     Comments: NORMAL AFFECT       Assessment & Plan:

## 2019-11-05 NOTE — Patient Instructions (Addendum)
EAT TO LIVE AND THINK OF FOOD AS MEDICINE. 75% OF YOUR PLATE SHOULD BE FRUITS/VEGGIES.  To have more energy, and to lose weight:      1. CONTINUE YOUR WEIGHT LOSS EFFORTS. I RECOMMEND YOU READ AND FOLLOW RECOMMENDATIONS BY DR. MARK HYMAN, "10-DAY DETOX DIET".    2. If you must eat bread, EAT EZEKIEL BREAD. IT IS IN THE FROZEN SECTION OF THE GROCERY STORE.    3. DRINK WATER WITH FRUIT OR CUCUMBER ADDED. YOUR URINE SHOULD BE LIGHT YELLOW. AVOID SODA, GATORADE, ENERGY DRINKS, OR DIET SODA.     4. AVOID HIGH FRUCTOSE CORN SYRUP AND CAFFEINE.     5. DO NOT chew SUGAR FREE GUM OR USE ARTIFICIAL SWEETENERS. IF NEEDED USE STEVIA AS A SWEETENER.    6. DO NOT EAT ENRICHED WHEAT FLOUR, PASTA, RICE, OR CEREAL.    7. ONLY EAT WILD CAUGHT SEAFOOD, GRASS FED BEEF OR CHICKEN, PORK FROM PASTURE RAISE PIGS, OR EGGS FROM PASTURE RAISED CHICKENS.    8. PRACTICE CHAIR YOGA FOR 15-30 MINS 3 OR 4 TIMES A WEEK AND PROGRESS TO HATHA YOGA OVER NEXT 6 MOS.    9. START TAKING A MULTIVITAMIN, VITAMIN B12, AND VITAMIN D3 2000 IU DAILY.   ADDITIONAL SUPPLEMENTS TO DECREASE CRAVING AND SUPPRESS YOUR APPETITE:    1. CINNAMON 500 MG EVERY AM PRIOR TO FIRST MEAL.   **STABILIZES BLOOD GLUCOSE/REDUCES CRAVINGS**    2. CHROMIUM 400-500 MG WITH MEALS TWICE DAILY.    **FAT BURNER**    3. GREEN TEA EXTRACT ONE DAILY.   **FAT BURNER/SUPPRESSES YOUR APPETITE**    4. ALPHA LIPOIC ACID TWICE DAILY.   **NATURAL ANTI-INFLAMMATORY SUPPLEMENT THAT IS AN ALTERNATIVE TO IBUPROFEN OR NAPROXEN**    CONTINUE PEPCID.   FOLLOW UP IN 1 YEAR.   NEXT COLONOSCOPY IN [facility] 2023.

## 2019-11-05 NOTE — Assessment & Plan Note (Signed)
SYMPTOMS CONTROLLED/RESOLVED.  CONTINUE TO MONITOR SYMPTOMS. 

## 2019-11-05 NOTE — Assessment & Plan Note (Signed)
NO WARNING SIGNS/SYMPTOMS  NEXT COLONOSCOPY IN [facility] 2023.

## 2019-11-05 NOTE — Assessment & Plan Note (Addendum)
SYMPTOMS FAIRLY WELL CONTROLLED.  EAT TO LIVE AND THINK OF FOOD AS MEDICINE. 75% OF YOUR PLATE SHOULD BE FRUITS/VEGGIES. To have more energy, and to lose weight:     1. CONTINUE YOUR WEIGHT LOSS EFFORTS. I RECOMMEND YOU READ AND FOLLOW RECOMMENDATIONS BY DR. MARK HYMAN, "10-DAY DETOX DIET".   2. If you must eat bread, EAT EZEKIEL BREAD. IT IS IN THE FROZEN SECTION OF THE GROCERY STORE.   3. DRINK WATER WITH FRUIT OR CUCUMBER ADDED. YOUR URINE SHOULD BE LIGHT YELLOW. AVOID SODA, GATORADE, ENERGY DRINKS, OR DIET SODA.    4. AVOID HIGH FRUCTOSE CORN SYRUP AND CAFFEINE.    5. DO NOT chew SUGAR FREE GUM OR USE ARTIFICIAL SWEETENERS. IF NEEDED USE STEVIA AS A SWEETENER.   6. DO NOT EAT ENRICHED WHEAT FLOUR, PASTA, RICE, OR CEREAL.   7. ONLY EAT WILD CAUGHT SEAFOOD, GRASS FED BEEF OR CHICKEN, PORK FROM PASTURE RAISE PIGS, OR EGGS FROM PASTURE RAISED CHICKENS.   8. PRACTICE CHAIR YOGA FOR 15-30 MINS 3 OR 4 TIMES A WEEK AND PROGRESS TO HATHA YOGA OVER NEXT 6 MOS. YOGA HANDOUT GIVEN AN DEMONSTRATED.   9. START TAKING A MULTIVITAMIN, VITAMIN B12, AND VITAMIN D3 2000 IU DAILY.  ADDITIONAL SUPPLEMENTS TO DECREASE CRAVING AND SUPPRESS YOUR APPETITE:   1. CINNAMON 500 MG EVERY AM PRIOR TO FIRST MEAL.   **STABILIZES BLOOD GLUCOSE/REDUCES CRAVINGS**   2. CHROMIUM 400-500 MG WITH MEALS TWICE DAILY.    **FAT BURNER**   3. GREEN TEA EXTRACT ONE DAILY.   **FAT BURNER/SUPPRESSES YOUR APPETITE**   4. ALPHA LIPOIC ACID TWICE DAILY.   **NATURAL ANTI-INFLAMMATORY SUPPLEMENT THAT IS AN ALTERNATIVE TO IBUPROFEN OR NAPROXEN**    CONTINUE PEPCID.  FOLLOW UP IN 1 YEAR.

## 2019-11-07 ENCOUNTER — Ambulatory Visit: Payer: Self-pay

## 2019-11-07 NOTE — Telephone Encounter (Signed)
   Reason for Disposition . [1] After 7 days AND [2] rash from bed bug bites is not improving  Answer Assessment - Initial Assessment Questions 1. LOCATION: "Where are the bites located?"     [place] shouder 2. ONSET: "When did you get bitten?"      A week 3. CAUSE: Why do you suspect bed bugs?" (e.g., recent travel, recent purchase of used clothing)     denies 4. REDNESS: "Is the area red or pink?" If so, ask "What size is area of redness?" (inches or cm). "When did the redness start?"     *No Answer* 5. ITCHING: "Does it itch?" If so, ask: "How bad is the itch?"    - MILD: doesn't interfere with normal activities   - MODERATE-SEVERE: interferes with work, school, sleep, or other activities     moderate 6. SWELLING: "How big is the swelling?" (inches, cm, or compare to coins)     denies 7. OTHER SYMPTOMS: "Do you have any other symptoms?"  (e.g., difficulty breathing, hives)    red marks Denies hives. 70 year old mattress.,  Protocols used: BED BUG BITE-A-AH

## 2019-11-07 NOTE — Telephone Encounter (Signed)
112Incoming call from Patient asking what kind of ointment an he purchase over the counter for  Questionable bed bug bite.  Not sure that it bed bug just saw some mark on him.   Called exterminator to come out next week.  Recommend calamine lotion and antibiotic ointment.  Voiced understanding.

## 2019-11-17 DIAGNOSIS — G4733 Obstructive sleep apnea (adult) (pediatric): Secondary | ICD-10-CM | POA: Diagnosis not present

## 2019-11-17 DIAGNOSIS — E669 Obesity, unspecified: Secondary | ICD-10-CM | POA: Diagnosis not present

## 2019-11-17 DIAGNOSIS — N2581 Secondary hyperparathyroidism of renal origin: Secondary | ICD-10-CM | POA: Diagnosis not present

## 2019-11-17 DIAGNOSIS — E1122 Type 2 diabetes mellitus with diabetic chronic kidney disease: Secondary | ICD-10-CM | POA: Diagnosis not present

## 2019-11-17 DIAGNOSIS — I129 Hypertensive chronic kidney disease with stage 1 through stage 4 chronic kidney disease, or unspecified chronic kidney disease: Secondary | ICD-10-CM | POA: Diagnosis not present

## 2019-11-17 DIAGNOSIS — Z8739 Personal history of other diseases of the musculoskeletal system and connective tissue: Secondary | ICD-10-CM | POA: Diagnosis not present

## 2019-11-17 DIAGNOSIS — Z87442 Personal history of urinary calculi: Secondary | ICD-10-CM | POA: Diagnosis not present

## 2019-11-17 DIAGNOSIS — N184 Chronic kidney disease, stage 4 (severe): Secondary | ICD-10-CM | POA: Diagnosis not present

## 2019-11-17 DIAGNOSIS — Z9989 Dependence on other enabling machines and devices: Secondary | ICD-10-CM | POA: Diagnosis not present

## 2019-11-17 DIAGNOSIS — D631 Anemia in chronic kidney disease: Secondary | ICD-10-CM | POA: Diagnosis not present

## 2019-11-17 DIAGNOSIS — Z1159 Encounter for screening for other viral diseases: Secondary | ICD-10-CM | POA: Diagnosis not present

## 2019-11-17 DIAGNOSIS — M1990 Unspecified osteoarthritis, unspecified site: Secondary | ICD-10-CM | POA: Diagnosis not present

## 2019-11-17 NOTE — Telephone Encounter (Signed)
This form was signed by Dr. Annamaria Boots on 11/04/19 but because I wasn't here the form was not faxed until 11/17/19

## 2019-11-21 ENCOUNTER — Other Ambulatory Visit: Payer: Self-pay | Admitting: Family Medicine

## 2019-11-21 DIAGNOSIS — M545 Low back pain, unspecified: Secondary | ICD-10-CM

## 2019-11-21 NOTE — Telephone Encounter (Signed)
Pt LOV was on 01/27/2019 and last refill was done on 07/23/2019 for 30 tabs with 3 refills. Pt is scheduled for a CPE on 12/10/2019, please advise

## 2019-12-10 ENCOUNTER — Encounter: Payer: Self-pay | Admitting: Family Medicine

## 2019-12-10 ENCOUNTER — Ambulatory Visit (INDEPENDENT_AMBULATORY_CARE_PROVIDER_SITE_OTHER): Payer: Medicare Other

## 2019-12-10 ENCOUNTER — Other Ambulatory Visit: Payer: Self-pay

## 2019-12-10 ENCOUNTER — Ambulatory Visit (INDEPENDENT_AMBULATORY_CARE_PROVIDER_SITE_OTHER): Payer: Medicare Other | Admitting: Family Medicine

## 2019-12-10 VITALS — BP 118/73 | HR 68 | Temp 97.7°F | Wt 206.0 lb

## 2019-12-10 DIAGNOSIS — L853 Xerosis cutis: Secondary | ICD-10-CM

## 2019-12-10 DIAGNOSIS — I10 Essential (primary) hypertension: Secondary | ICD-10-CM | POA: Diagnosis not present

## 2019-12-10 DIAGNOSIS — E782 Mixed hyperlipidemia: Secondary | ICD-10-CM | POA: Diagnosis not present

## 2019-12-10 DIAGNOSIS — M545 Low back pain, unspecified: Secondary | ICD-10-CM

## 2019-12-10 DIAGNOSIS — M25551 Pain in right hip: Secondary | ICD-10-CM

## 2019-12-10 DIAGNOSIS — N1831 Chronic kidney disease, stage 3a: Secondary | ICD-10-CM

## 2019-12-10 LAB — LIPID PANEL
Cholesterol: 260 mg/dL — ABNORMAL HIGH (ref 0–200)
HDL: 51.5 mg/dL (ref >39.00)
LDL Cholesterol: 179 mg/dL — ABNORMAL HIGH (ref 0–99)
NonHDL: 208.47
Total CHOL/HDL Ratio: 5
Triglycerides: 146 mg/dL (ref 0.0–149.0)
VLDL: 29.2 mg/dL (ref 0.0–40.0)

## 2019-12-10 MED ORDER — TRIAMCINOLONE ACETONIDE 0.025 % EX OINT: 1.0000 "application " | TOPICAL_OINTMENT | Freq: Two times a day (BID) | CUTANEOUS | 0 refills | Status: DC

## 2019-12-10 NOTE — Patient Instructions (Addendum)
Chronic Back Pain When back pain lasts longer than 3 months, it is called chronic back pain.The cause of your back pain may not be known. Some common causes include:  Wear and tear (degenerative disease) of the bones, ligaments, or disks in your back.  Inflammation and stiffness in your back (arthritis). People who have chronic back pain often go through certain periods in which the pain is more intense (flare-ups). Many people can learn to manage the pain with home care. Follow these instructions at home: Pay attention to any changes in your symptoms. Take these actions to help with your pain: Activity   Avoid bending and other activities that make the problem worse.  Maintain a proper position when standing or sitting: ? When standing, keep your upper back and neck straight, with your shoulders pulled back. Avoid slouching. ? When sitting, keep your back straight and relax your shoulders. Do not round your shoulders or pull them backward.  Do not sit or stand in one place for long periods of time.  Take brief periods of rest throughout the day. This will reduce your pain. Resting in a lying or standing position is usually better than sitting to rest.  When you are resting for longer periods, mix in some mild activity or stretching between periods of rest. This will help to prevent stiffness and pain.  Get regular exercise. Ask your health care provider what activities are safe for you.  Do not lift anything that is heavier than 10 lb (4.5 kg). Always use proper lifting technique, which includes: ? Bending your knees. ? Keeping the load close to your body. ? Avoiding twisting.  Sleep on a firm mattress in a comfortable position. Try lying on your side with your knees slightly bent. If you lie on your back, put a pillow under your knees. Managing pain  If directed, apply ice to the painful area. Your health care provider may recommend applying ice during the first 24-48 hours after  a flare-up begins. ? Put ice in a plastic bag. ? Place a towel between your skin and the bag. ? Leave the ice on for 20 minutes, 2-3 times per day.  If directed, apply heat to the affected area as often as told by your health care provider. Use the heat source that your health care provider recommends, such as a moist heat pack or a heating pad. ? Place a towel between your skin and the heat source. ? Leave the heat on for 20-30 minutes. ? Remove the heat if your skin turns bright red. This is especially important if you are unable to feel pain, heat, or cold. You may have a greater risk of getting burned.  Try soaking in a warm tub.  Take over-the-counter and prescription medicines only as told by your health care provider.  Keep all follow-up visits as told by your health care provider. This is important. Contact a health care provider if:  You have pain that is not relieved with rest or medicine. Get help right away if:  You have weakness or numbness in one or both of your legs or feet.  You have trouble controlling your bladder or your bowels.  You have nausea or vomiting.  You have pain in your abdomen.  You have shortness of breath or you faint. This information is not intended to replace advice given to you by your health care provider. Make sure you discuss any questions you have with your health care provider. Document Revised: 03/13/2019   Document Reviewed: 05/30/2017 Elsevier Patient Education  [place].  Hip Pain The hip is the joint between the upper legs and the lower pelvis. The bones, cartilage, tendons, and muscles of your hip joint support your body and allow you to move around. Hip pain can range from a minor ache to severe pain in one or both of your hips. The pain may be felt on the inside of the hip joint near the groin, or on the outside near the buttocks and upper thigh. You may also have swelling or stiffness in your hip area. Follow these  instructions at home: Managing pain, stiffness, and swelling      If directed, put ice on the painful area. To do this: ? Put ice in a plastic bag. ? Place a towel between your skin and the bag. ? Leave the ice on for 20 minutes, 2-3 times a day.  If directed, apply heat to the affected area as often as told by your health care provider. Use the heat source that your health care provider recommends, such as a moist heat pack or a heating pad. ? Place a towel between your skin and the heat source. ? Leave the heat on for 20-30 minutes. ? Remove the heat if your skin turns bright red. This is especially important if you are unable to feel pain, heat, or cold. You may have a greater risk of getting burned. Activity  Do exercises as told by your health care provider.  Avoid activities that cause pain. General instructions   Take over-the-counter and prescription medicines only as told by your health care provider.  Keep a journal of your symptoms. Write down: ? How often you have hip pain. ? The location of your pain. ? What the pain feels like. ? What makes the pain worse.  Sleep with a pillow between your legs on your most comfortable side.  Keep all follow-up visits as told by your health care provider. This is important. Contact a health care provider if:  You cannot put weight on your leg.  Your pain or swelling continues or gets worse after one week.  It gets harder to walk.  You have a fever. Get help right away if:  You fall.  You have a sudden increase in pain and swelling in your hip.  Your hip is red or swollen or very tender to touch. Summary  Hip pain can range from a minor ache to severe pain in one or both of your hips.  The pain may be felt on the inside of the hip joint near the groin, or on the outside near the buttocks and upper thigh.  Avoid activities that cause pain.  Write down how often you have hip pain, the location of the pain, what  makes it worse, and what it feels like. This information is not intended to replace advice given to you by your health care provider. Make sure you discuss any questions you have with your health care provider. Document Revised: 04/07/2019 Document Reviewed: 04/07/2019 Elsevier Patient Education  [place] Your Hypertension Hypertension is commonly called high blood pressure. This is when the force of your blood pressing against the walls of your arteries is too strong. Arteries are blood vessels that carry blood from your heart throughout your body. Hypertension forces the heart to work harder to pump blood, and may cause the arteries to become narrow or stiff. Having untreated or uncontrolled hypertension can cause heart attack,  stroke, kidney disease, and other problems. What are blood pressure readings? A blood pressure reading consists of a higher number over a lower number. Ideally, your blood pressure should be below 120/80. The first ("top") number is called the systolic pressure. It is a measure of the pressure in your arteries as your heart beats. The second ("bottom") number is called the diastolic pressure. It is a measure of the pressure in your arteries as the heart relaxes. What does my blood pressure reading mean? Blood pressure is classified into four stages. Based on your blood pressure reading, your health care provider may use the following stages to determine what type of treatment you need, if any. Systolic pressure and diastolic pressure are measured in a unit called mm Hg. Normal  Systolic pressure: below 696.  Diastolic pressure: below 80. Elevated  Systolic pressure: 295-284.  Diastolic pressure: below 80. Hypertension stage 1  Systolic pressure: 132-440.  Diastolic pressure: 10-27. Hypertension stage 2  Systolic pressure: 253 or above.  Diastolic pressure: 90 or above. What health risks are associated with hypertension? Managing your  hypertension is an important responsibility. Uncontrolled hypertension can lead to:  A heart attack.  A stroke.  A weakened blood vessel (aneurysm).  Heart failure.  Kidney damage.  Eye damage.  Metabolic syndrome.  Memory and concentration problems. What changes can I make to manage my hypertension? Hypertension can be managed by making lifestyle changes and possibly by taking medicines. Your health care provider will help you make a plan to bring your blood pressure within a normal range. Eating and drinking   Eat a diet that is high in fiber and potassium, and low in salt (sodium), added sugar, and fat. An example eating plan is called the DASH (Dietary Approaches to Stop Hypertension) diet. To eat this way: ? Eat plenty of fresh fruits and vegetables. Try to fill half of your plate at each meal with fruits and vegetables. ? Eat whole grains, such as whole wheat pasta, brown rice, or whole grain bread. Fill about one quarter of your plate with whole grains. ? Eat low-fat diary products. ? Avoid fatty cuts of meat, processed or cured meats, and poultry with skin. Fill about one quarter of your plate with lean proteins such as fish, chicken without skin, beans, eggs, and tofu. ? Avoid premade and processed foods. These tend to be higher in sodium, added sugar, and fat.  Reduce your daily sodium intake. Most people with hypertension should eat less than 1,500 mg of sodium a day.  Limit alcohol intake to no more than 1 drink a day for nonpregnant women and 2 drinks a day for men. One drink equals 12 oz of beer, 5 oz of wine, or 1 oz of hard liquor. Lifestyle  Work with your health care provider to maintain a healthy body weight, or to lose weight. Ask what an ideal weight is for you.  Get at least 30 minutes of exercise that causes your heart to beat faster (aerobic exercise) most days of the week. Activities may include walking, swimming, or biking.  Include exercise to  strengthen your muscles (resistance exercise), such as weight lifting, as part of your weekly exercise routine. Try to do these types of exercises for 30 minutes at least 3 days a week.  Do not use any products that contain nicotine or tobacco, such as cigarettes and e-cigarettes. If you need help quitting, ask your health care provider.  Control any long-term (chronic) conditions you have, such as  high cholesterol or diabetes. Monitoring  Monitor your blood pressure at home as told by your health care provider. Your personal target blood pressure may vary depending on your medical conditions, your age, and other factors.  Have your blood pressure checked regularly, as often as told by your health care provider. Working with your health care provider  Review all the medicines you take with your health care provider because there may be side effects or interactions.  Talk with your health care provider about your diet, exercise habits, and other lifestyle factors that may be contributing to hypertension.  Visit your health care provider regularly. Your health care provider can help you create and adjust your plan for managing hypertension. Will I need medicine to control my blood pressure? Your health care provider may prescribe medicine if lifestyle changes are not enough to get your blood pressure under control, and if:  Your systolic blood pressure is 130 or higher.  Your diastolic blood pressure is 80 or higher. Take medicines only as told by your health care provider. Follow the directions carefully. Blood pressure medicines must be taken as prescribed. The medicine does not work as well when you skip doses. Skipping doses also puts you at risk for problems. Contact a health care provider if:  You think you are having a reaction to medicines you have taken.  You have repeated (recurrent) headaches.  You feel dizzy.  You have swelling in your ankles.  You have trouble with your  vision. Get help right away if:  You develop a severe headache or confusion.  You have unusual weakness or numbness, or you feel faint.  You have severe pain in your chest or abdomen.  You vomit repeatedly.  You have trouble breathing. Summary  Hypertension is when the force of blood pumping through your arteries is too strong. If this condition is not controlled, it may put you at risk for serious complications.  Your personal target blood pressure may vary depending on your medical conditions, your age, and other factors. For most people, a normal blood pressure is less than 120/80.  Hypertension is managed by lifestyle changes, medicines, or both. Lifestyle changes include weight loss, eating a healthy, low-sodium diet, exercising more, and limiting alcohol. This information is not intended to replace advice given to you by your health care provider. Make sure you discuss any questions you have with your health care provider. Document Revised: 03/14/2019 Document Reviewed: 10/18/2016 Elsevier Patient Education  [place] are tiny bugs that live in and around beds. They stay hidden during the day, and they come out at night and bite. Bedbugs need blood to live and grow. Where are bedbugs found? Bedbugs can be found anywhere, whether a place is clean or dirty. They are found in places where many people come and go, such as hotels, shelters, dorms, and health care settings. It is also common for them to be found in homes where there are many birds or bats nearby. What are bedbug bites like?  A bedbug bite leaves a small red bump with a darker red dot in the middle. The bump may appear soon after a person is bitten or one or more days later. Bedbug bites usually do not hurt, but they may itch. Most people do not need treatment for bedbug bites. The bumps usually go away on their own in a few days. If you have a lot of bedbug bites and they feel very  itchy:  Do not scratch the bite areas.  You may apply any of these to the bite area as told by your health care provider: ? A baking soda paste. Make this by adding water to baking soda. ? Cortisone cream. ? Calamine lotion. How do I check for bedbugs? Adult bedbugs are reddish-brown, oval, and flat. They are about as long as a grain of rice ( inch or 5-7 mm), and they cannot fly. Young bedbugs (nymphs) are smaller, and they are whitish-yellow or clear (translucent). Using a flashlight, look for bedbugs in these places:  On mattresses, bed frames, headboards, and box springs.  On drapes and curtains in bedrooms.  Under carpeting in bedrooms.  Behind electrical outlets.  Behind any wallpaper that is peeling.  Inside luggage. Also look for black or red spots or stains on or near the bed. Stains can come from bedbugs that have been crushed or from bedbug waste. What should I do if I find bedbugs? When traveling If you find bedbugs while traveling, check all of your possessions carefully before you bring them into your home. Consider throwing away anything that has bedbugs on it. At home If you find bedbugs at home, your bedroom may need to be treated by a pest control expert. You may also need to throw away mattresses or luggage. To help prevent bedbugs from coming back, consider taking these actions:  Wash your clothes and bedding in water that is hotter than 120F (48.9C) and dry them on a hot setting. Bedbugs are killed by high temperatures.  Put a plastic cover over your mattress.  When sleeping, wear pajamas that have long sleeves and pant legs. Bedbugs usually bite areas of the skin that are not covered.  Vacuum often around the bed and in all of the cracks and crevices where the bugs might hide.  Carefully check all used furniture, bedding, or clothes that you bring into your home.  Eliminate bird nests and bat roosts that are near your home. Where to find more  information  [employer], operational (EPA): BluetoothSpecialist.co.nz Summary  Bedbugs are tiny bugs that live in and around beds.  Bedbugs are most often found in places where many people come and go, such as hotels, shelters, dorms, and health care settings.  A bedbug bite leaves a small red bump with a darker red dot in the middle.  Bedbug bites usually do not hurt, but they may itch.  If you find bedbugs at home, your bedroom may need to be treated by a pest control expert. This information is not intended to replace advice given to you by your health care provider. Make sure you discuss any questions you have with your health care provider. Document Revised: 11/02/2017 Document Reviewed: 07/13/2017 Elsevier Patient Education  [place].  Chronic Kidney Disease, Adult Chronic kidney disease (CKD) occurs when the kidneys become damaged slowly over a long period of time. The kidneys are a pair of organs that do many important jobs in the body, including:  Removing waste and extra fluid from the blood to make urine.  Making hormones that maintain the amount of fluid in tissues and blood vessels.  Maintaining the right amount of fluids and chemicals in the body. A small amount of kidney damage may not cause problems, but a large amount of damage may make it hard or impossible for the kidneys to work the way they should. If steps are not taken to slow down kidney damage or to stop it from  getting worse, the kidneys may stop working permanently (end-stage renal disease or ESRD). Most of the time, CKD does not go away, but it can often be controlled. People who have CKD are usually able to live normal lives. What are the causes? The most common causes of this condition are diabetes and high blood pressure (hypertension). Other causes include:  Heart and blood vessel (cardiovascular) disease.  Kidney diseases, such as: ? Glomerulonephritis. ? Interstitial  nephritis. ? Polycystic kidney disease. ? Renal vascular disease.  Diseases that affect the immune system.  Genetic diseases.  Medicines that damage the kidneys, such as anti-inflammatory medicines.  Being around or being in contact with poisonous (toxic) substances.  A kidney or urinary infection that occurs again and again (recurs).  Vasculitis. This is swelling or inflammation of the blood vessels.  A problem with urine flow that may be caused by: ? Cancer. ? Having kidney stones more than one time. ? An enlarged prostate, in males. What increases the risk? You are more likely to develop this condition if you:  Are older than age 56.  Are male.  Are African-American, Hispanic, Asian, [place], or American [place].  Are a current or former smoker.  Are obese.  Have a family history of kidney disease or failure.  Often take medicines that are damaging to the kidneys. What are the signs or symptoms? Symptoms of this condition include:  Swelling (edema) of the face, legs, ankles, or feet.  Tiredness (lethargy) and having less energy.  Nausea or vomiting.  Confusion or trouble concentrating.  Problems with urination, such as: ? Painful or burning feeling during urination. ? Decreased urine production. ? Frequent urination, especially at night. ? Bloody urine.  Muscle twitches and cramps, especially in the legs.  Shortness of breath.  Weakness.  Loss of appetite.  Metallic taste in the mouth.  Trouble sleeping.  Dry, itchy skin.  A low blood count (anemia).  Pale lining of the eyelids and surface of the eye (conjunctiva). Symptoms develop slowly and may not be obvious until the kidney damage becomes severe. It is possible to have kidney disease for years without having any symptoms. How is this diagnosed? This condition may be diagnosed based on:  Blood tests.  Urine tests.  Imaging tests, such as an ultrasound or CT scan.  A test  in which a sample of tissue is removed from the kidneys to be examined under a microscope (kidney biopsy). These test results will help your health care provider determine how serious the CKD is. How is this treated? There is no cure for most cases of this condition, but treatment usually relieves symptoms and prevents or slows the progression of the disease. Treatment may include:  Making diet changes, which may require you to avoid alcohol, salty foods (sodium), and foods that are high in potassium, calcium, and protein.  Medicines: ? To lower blood pressure. ? To control blood glucose. ? To relieve anemia. ? To relieve swelling. ? To protect your bones. ? To improve the balance of electrolytes in your blood.  Removing toxic waste from the body through types of dialysis, if the kidneys can no longer do their job (kidney failure).  Managing any other conditions that are causing your CKD or making it worse. Follow these instructions at home: Medicines  Take over-the-counter and prescription medicines only as told by your health care provider. The dose of some medicines that you take may need to be adjusted.  Do not take any new  medicines unless approved by your health care provider. Many medicines can worsen your kidney damage.  Do not take any vitamin and mineral supplements unless approved by your health care provider. Many nutritional supplements can worsen your kidney damage. General instructions  Follow your prescribed diet as told by your health care provider.  Do not use any products that contain nicotine or tobacco, such as cigarettes and e-cigarettes. If you need help quitting, ask your health care provider.  Monitor and track your blood pressure at home. Report changes in your blood pressure as told by your health care provider.  If you are being treated for diabetes, monitor and track your blood sugar (blood glucose) levels as told by your health care  provider.  Maintain a healthy weight. If you need help with this, ask your health care provider.  Start or continue an exercise plan. Exercise at least 30 minutes a day, 5 days a week.  Keep your immunizations up to date as told by your health care provider.  Keep all follow-up visits as told by your health care provider. This is important. Where to find more information  American Association of Kidney Patients: BombTimer.gl  National Kidney Foundation: www.kidney.[place]: https://mathis.com/  [facility]: www.lifeoptions.org and www.kidneyschool.org Contact a health care provider if:  Your symptoms get worse.  You develop new symptoms. Get help right away if:  You develop symptoms of ESRD, which include: ? Headaches. ? Numbness in the hands or feet. ? Easy bruising. ? Frequent hiccups. ? Chest pain. ? Shortness of breath. ? Lack of menstruation, in women.  You have a fever.  You have decreased urine production.  You have pain or bleeding when you urinate. Summary  Chronic kidney disease (CKD) occurs when the kidneys become damaged slowly over a long period of time.  The most common causes of this condition are diabetes and high blood pressure (hypertension).  There is no cure for most cases of this condition, but treatment usually relieves symptoms and prevents or slows the progression of the disease. Treatment may include a combination of medicines and lifestyle changes. This information is not intended to replace advice given to you by your health care provider. Make sure you discuss any questions you have with your health care provider. Document Revised: 11/02/2017 Document Reviewed: 12/28/2016 Elsevier Patient Education  2020 [employer].

## 2019-12-10 NOTE — Progress Notes (Signed)
Subjective:    Patient ID: Bruce Weber., male    DOB: 07-26-1951, 69 y.o.   MRN: [id number]  No chief complaint on file.   HPI Patient was seen today for f/u.  Pt recently seen by [facility] for CKD, seen by Dr. Jimmy Footman, but now seeing a new provider.  Felodipine d/c'd.  Pt now taking norvasc 10 mg, lasix 40 mg, hydralazine 25 mg TID, and propanolol 80 mg.   Pt notes slight increase in bp, 130s/80s at home.  Pt with continued low back and R hip pain.  Notes stiffness in the am that improves with movement.  At times has pain in R groin and L lower back.  Xrays of lumbar spine showed disc space narrowing last yr.  Pt requesting referral to [facility].  Seen by Dr. Arther Abbott in the past.  Pt also notes pruritis.  States his mother moved in and brought bed bugs several months ago after buying items from yard sales.  Pt had house exterminated, but notes intermittent itching on LEs.  Tried [employer]	[place] for symptoms.  Denies rash or lesions elsewhere.  Past Medical History:  Diagnosis Date  . Adenoma 11/2009   1.1 cm from TCS  . DM (diabetes mellitus) ([place])   . GERD (gastroesophageal reflux disease)   . High cholesterol   . HTN (hypertension)   . Kidney stones   . Obesity (BMI 30-39.9) DEC 2011 223 LBS  . Rectal bleeding 2011   secondary to hemorrhoids    Allergies  Allergen Reactions  . Dye Fdc Blue [Brilliant Blue Fcf (Fd&C Blue #1)]     PT UNSURE WHICH DYE  . Iodinated Diagnostic Agents Hives    30 YEARS AGO    ROS General: Denies fever, chills, night sweats, changes in weight, changes in appetite HEENT: Denies headaches, ear pain, changes in vision, rhinorrhea, sore throat CV: Denies CP, palpitations, SOB, orthopnea Pulm: Denies SOB, cough, wheezing GI: Denies abdominal pain, nausea, vomiting, diarrhea, constipation GU: Denies dysuria, hematuria, frequency, vaginal discharge Msk: Denies muscle cramps, joint pains  +back and R hip pain Neuro: Denies  weakness, numbness, tingling Skin: Denies rashes, bruising  +pruritis. Psych: Denies depression, anxiety, hallucinations    Objective:    Blood pressure 130/84, pulse 68, temperature 97.7 F (36.5 C), temperature source Temporal, weight 206 lb (93.4 kg), SpO2 97 %.   Gen. Pleasant, well-nourished, in no distress, normal affect   HEENT: [place]/AT, face symmetric, no scleral icterus, PERRLA, EOMI, nares patent without drainage Lungs: no accessory muscle use, CTAB, no wheezes or rales Cardiovascular: RRR, no m/r/g, no peripheral edema Abdomen: BS present, soft, NT/ND Musculoskeletal: No TTP of cervical, thoracic, or lumbar spine.  TTP of R hip.  Mild discomfort with Abduction of RLE.  Negative log roll, straight leg raise, FADIR, and FABER of b/l LEs.  No deformities, no cyanosis or clubbing, normal tone Neuro:  A&Ox3, CN II-XII intact, normal gait Skin:  Warm, no lesions/ rash.  Dry skin with excoriations of LLE.  No lesions between web spaces of fingers.   Wt Readings from Last 3 Encounters:  12/10/19 206 lb (93.4 kg)  11/05/19 207 lb 3.2 oz (94 kg)  05/20/19 207 lb (93.9 kg)    Lab Results  Component Value Date   WBC 11.5 (H) 01/27/2019   HGB 13.5 01/27/2019   HCT 40.9 01/27/2019   PLT 224.0 01/27/2019   GLUCOSE 110 (H) 01/27/2019   CHOL 164 01/27/2019   TRIG 97.0 01/27/2019  HDL 48.90 01/27/2019   LDLCALC 96 01/27/2019   ALT 10 04/09/2018   AST 13 (A) 04/09/2018   NA 144 01/27/2019   K 4.6 01/27/2019   CL 108 01/27/2019   CREATININE 3.08 (H) 01/27/2019   BUN 34 (H) 01/27/2019   CO2 25 01/27/2019   PSA 1.41 01/27/2019    Assessment/Plan:  Essential hypertension -controlled   -discussed lifestyle modifications -continue current meds:  norvasc 10 mg, lasix 40 mg, hydralazine 25 mg TID, propranolol 80 mg -continue monitoring at home  Acute bilateral low back pain without sciatica  -lumbar spine xray 01/27/19 with mild disc space narrowing of T12-L1, L1-L2.  Mild  narrowing posterior aspect L5-S1 disc space. -discussed supportive care: stretching, heat, massage, NSAIDs - Plan: Ambulatory referral to Orthopedic Surgery  Right hip pain  -possibly 2/2 arthritis -supportive care -will obtain imaging - Plan: DG Hip Unilat W OR W/O Pelvis 2-3 Views Right, Ambulatory referral to Orthopedic Surgery  Xerosis of skin  -skin likely irritated 2/2 being dry.  Must also consider h/o bed bugs. - Plan: triamcinolone (KENALOG) 0.025 % ointment  Mixed hyperlipidemia  -continue crestor 5 mg daily - Plan: Lipid Panel  Stage 3a chronic kidney disease -stable -recent labs done by nephrology -continue to avoid nephrotoxic meds -continue f/u with [facility]  F/u in 3 months  Grier Mitts, MD

## 2019-12-15 ENCOUNTER — Telehealth: Payer: Self-pay | Admitting: Family Medicine

## 2019-12-15 ENCOUNTER — Other Ambulatory Visit: Payer: Self-pay

## 2019-12-15 MED ORDER — ROSUVASTATIN CALCIUM 5 MG PO TABS: 5.0000 mg | ORAL_TABLET | Freq: Every day | ORAL | 5 refills | Status: DC

## 2019-12-15 NOTE — Telephone Encounter (Signed)
Message Routed to PCP CMA 

## 2019-12-15 NOTE — Telephone Encounter (Signed)
Pt states Dr Volanda Napoleon was to call in statin med for him after the visit on 12/10/19, but nothing is at the pharmacy.  Please advise.  [id number] - [place], [place] Phone:  [phone]  Fax:  [phone]

## 2019-12-15 NOTE — Telephone Encounter (Signed)
Rx sent to pt pharmacy 

## 2020-01-14 ENCOUNTER — Other Ambulatory Visit: Payer: Self-pay

## 2020-01-14 ENCOUNTER — Encounter: Payer: Self-pay | Admitting: Orthopedic Surgery

## 2020-01-14 ENCOUNTER — Ambulatory Visit (INDEPENDENT_AMBULATORY_CARE_PROVIDER_SITE_OTHER): Payer: Medicare Other | Admitting: Orthopedic Surgery

## 2020-01-14 VITALS — BP 134/79 | HR 63 | Temp 98.4°F | Ht 70.0 in | Wt 204.0 lb

## 2020-01-14 DIAGNOSIS — M545 Low back pain, unspecified: Secondary | ICD-10-CM

## 2020-01-14 MED ORDER — GABAPENTIN 100 MG PO CAPS: 100.0000 mg | ORAL_CAPSULE | Freq: Three times a day (TID) | ORAL | 2 refills | Status: DC

## 2020-01-14 NOTE — Patient Instructions (Addendum)
You have arthritis in the lower back   We will order physical therapy you should receive a phone call from the PT department. If you havent in 3 days please call us to follow up on it    Take 500 mg tylenol every 6 hrs for pain as well as use a heating pad as neded   I ve also added some gabapentin 100 mg 3 times a day    Chronic Back Pain When back pain lasts longer than 3 months, it is called chronic back pain.The cause of your back pain may not be known. Some common causes include:  Wear and tear (degenerative disease) of the bones, ligaments, or disks in your back.  Inflammation and stiffness in your back (arthritis). People who have chronic back pain often go through certain periods in which the pain is more intense (flare-ups). Many people can learn to manage the pain with home care. Follow these instructions at home: Pay attention to any changes in your symptoms. Take these actions to help with your pain: Activity   Avoid bending and other activities that make the problem worse.  Maintain a proper position when standing or sitting: ? When standing, keep your upper back and neck straight, with your shoulders pulled back. Avoid slouching. ? When sitting, keep your back straight and relax your shoulders. Do not round your shoulders or pull them backward.  Do not sit or stand in one place for long periods of time.  Take brief periods of rest throughout the day. This will reduce your pain. Resting in a lying or standing position is usually better than sitting to rest.  When you are resting for longer periods, mix in some mild activity or stretching between periods of rest. This will help to prevent stiffness and pain.  Get regular exercise. Ask your health care provider what activities are safe for you.  Do not lift anything that is heavier than 10 lb (4.5 kg). Always use proper lifting technique, which includes: ? Bending your knees. ? Keeping the load close to your  body. ? Avoiding twisting.  Sleep on a firm mattress in a comfortable position. Try lying on your side with your knees slightly bent. If you lie on your back, put a pillow under your knees. Managing pain  If directed, apply ice to the painful area. Your health care provider may recommend applying ice during the first 24-48 hours after a flare-up begins. ? Put ice in a plastic bag. ? Place a towel between your skin and the bag. ? Leave the ice on for 20 minutes, 2-3 times per day.  If directed, apply heat to the affected area as often as told by your health care provider. Use the heat source that your health care provider recommends, such as a moist heat pack or a heating pad. ? Place a towel between your skin and the heat source. ? Leave the heat on for 20-30 minutes. ? Remove the heat if your skin turns bright red. This is especially important if you are unable to feel pain, heat, or cold. You may have a greater risk of getting burned.  Try soaking in a warm tub.  Take over-the-counter and prescription medicines only as told by your health care provider.  Keep all follow-up visits as told by your health care provider. This is important. Contact a health care provider if:  You have pain that is not relieved with rest or medicine. Get help right away if:  You have  weakness or numbness in one or both of your legs or feet.  You have trouble controlling your bladder or your bowels.  You have nausea or vomiting.  You have pain in your abdomen.  You have shortness of breath or you faint. This information is not intended to replace advice given to you by your health care provider. Make sure you discuss any questions you have with your health care provider. Document Revised: 03/13/2019 Document Reviewed: 05/30/2017 Elsevier Patient Education  [place].

## 2020-01-14 NOTE — Progress Notes (Signed)
Bruce Weber.  01/14/2020  Body mass index is 29.27 kg/m.   HISTORY SECTION :  Chief Complaint  Patient presents with  . Hip Pain    Right hip pain, xrays in system.   69 year old male presents for evaluation of lower back pain which started on one side and went across his back and is settled shoulders and both sides of the back and radiates to both hips  He does not have any groin pain  He has not have any radicular symptoms  He is only tried Tylenol.  His symptoms started a few months ago he did have x-rays of his hips and his back and he has mild arthritis in his hip and degenerative arthritis in his lower back    Review of Systems  Eyes: Positive for double vision.  Gastrointestinal: Negative.   Genitourinary: Negative.   Endo/Heme/Allergies: Positive for environmental allergies.     has a past medical history of Adenoma (11/2009), DM (diabetes mellitus) ([place]), GERD (gastroesophageal reflux disease), High cholesterol, HTN (hypertension), Kidney stones, Obesity (BMI 30-39.9) (DEC 2011 223 LBS), and Rectal bleeding (2011).   Past Surgical History:  Procedure Laterality Date  . CIRCUMCISION    . COLONOSCOPY  Dec 2010 [facility]   MOD IH, SIMPLE ADENOMA 1.1 CM  . COLONOSCOPY  11/25/2012   SLF: 1. Sessile polyp measuring 37mm in size was found at the hepatic flesure; polypectomy was performed using snare cautery 2. Moderate diverticulosis was noted in the ascending colon and sigmoid colon. 3. The colon mucosa was otherwise normal 4. Large internal hemorrhoids.   . COLONOSCOPY N/A 03/01/2017   Procedure: COLONOSCOPY;  Surgeon: Danie Binder, MD;  Location: [facility];  Service: Endoscopy;  Laterality: N/A;  10:30 AM  . EYE SURGERY  05/2018   bilateral cataract surgery  . HERNIA REPAIR      Body mass index is 29.27 kg/m.   Allergies  Allergen Reactions  . Dye Fdc Blue [Brilliant Blue Fcf (Fd&C Blue #1)]     PT UNSURE WHICH DYE  . Iodinated Diagnostic Agents Hives     30 YEARS AGO     Current Outpatient Medications:  .  acetaminophen (TYLENOL) 500 MG tablet, Take 1,000 mg by mouth 2 (two) times daily as needed for headache., Disp: , Rfl:  .  allopurinol (ZYLOPRIM) 100 MG tablet, Take 300 mg by mouth daily. , Disp: , Rfl:  .  amLODipine (NORVASC) 10 MG tablet, Take 10 mg by mouth daily., Disp: , Rfl:  .  calcitRIOL (ROCALTROL) 0.25 MCG capsule, Take 0.25 mcg by mouth at bedtime. , Disp: , Rfl:  .  clomiPHENE (CLOMID) 50 MG tablet, Take 25 mg by mouth at bedtime. , Disp: , Rfl:  .  cyclobenzaprine (FLEXERIL) 5 MG tablet, TAKE 1 TABLET BY MOUTH AT BEDTIME AS NEEDED FOR MUSCLE SPASMS., Disp: 30 tablet, Rfl: 3 .  famciclovir (FAMVIR) 500 MG tablet, Take 1,000 mg by mouth 2 (two) times daily as needed (fever blisters). , Disp: , Rfl: 5 .  famotidine (PEPCID) 40 MG tablet, Take 1 tablet (40 mg total) by mouth daily. WITH BREAKFAST, Disp: 30 tablet, Rfl: 3 .  fluticasone (FLONASE) 50 MCG/ACT nasal spray, Place 1 spray into both nostrils daily as needed for allergies or rhinitis., Disp: , Rfl:  .  furosemide (LASIX) 40 MG tablet, Take 40 mg by mouth., Disp: , Rfl:  .  hydrALAZINE (APRESOLINE) 25 MG tablet, Take 25 mg by mouth 3 (three) times daily., Disp: ,  Rfl:  .  propranolol (INDERAL) 80 MG tablet, TAKE 1 TABLET (80 MG TOTAL) BY MOUTH 2 (TWO) TIMES DAILY., Disp: 180 tablet, Rfl: 1 .  psyllium (METAMUCIL) 58.6 % powder, Take 1 packet by mouth daily. As needed for constipation, Disp: , Rfl:  .  rosuvastatin (CRESTOR) 5 MG tablet, Take 1 tablet (5 mg total) by mouth daily., Disp: 30 tablet, Rfl: 5 .  traZODone (DESYREL) 50 MG tablet, TAKE 1 TABLET BY MOUTH EVERYDAY AT BEDTIME, Disp: 90 tablet, Rfl: 2 .  triamcinolone (KENALOG) 0.025 % ointment, Apply 1 application topically 2 (two) times daily., Disp: 30 g, Rfl: 0 .  vitamin C (ASCORBIC ACID) 500 MG tablet, Take 500 mg by mouth 2 (two) times a week. , Disp: , Rfl:  .  felodipine (PLENDIL) 10 MG 24 hr tablet, Take  10 mg by mouth daily., Disp: , Rfl:  .  gabapentin (NEURONTIN) 100 MG capsule, Take 1 capsule (100 mg total) by mouth 3 (three) times daily., Disp: 90 capsule, Rfl: 2   PHYSICAL EXAM SECTION: 1) BP 134/79   Pulse 63   Temp 98.4 F (36.9 C)   Ht 5\' 10"  (1.778 m)   Wt 204 lb (92.5 kg)   BMI 29.27 kg/m   Body mass index is 29.27 kg/m. General appearance: Well-developed well-nourished no gross deformities  2) Cardiovascular normal pulse and perfusion , normal color   3) Neurologically deep tendon reflexes are equal and normal, no sensation loss or deficits no pathologic reflexes  4) Psychological: Awake alert and oriented x3 mood and affect normal  5) Skin no lacerations or ulcerations no nodularity no palpable masses, no erythema or nodularity  6) Musculoskeletal:     Both hips were normal full range of motion no pain I could not produce any of the symptoms with stressing his hip  He was tender in his lower back straight leg raises were negative his neurovascular exam was intact his reflexes were good his pinprick test were normal MEDICAL DECISION MAKING  A.  Encounter Diagnosis  Name Primary?  . Lumbar pain Yes    B. DATA ANALYSED:  IMAGING: Independent interpretation of images: Yes, first set lumbar spine degenerative arthritis in the lumbar spine  Hip and pelvis mild arthritis right hip  Orders: Physical therapy  Outside records reviewed: No  C. MANAGEMENT physical therapy and medication stay away from any anti-inflammatories patient has grade 3-4 kidney disease  Meds ordered this encounter  Medications  . gabapentin (NEURONTIN) 100 MG capsule    Sig: Take 1 capsule (100 mg total) by mouth 3 (three) times daily.    Dispense:  90 capsule    Refill:  2      Arther Abbott, MD  01/14/2020 2:32 PM

## 2020-01-20 ENCOUNTER — Encounter (HOSPITAL_COMMUNITY): Payer: Self-pay | Admitting: Physical Therapy

## 2020-01-20 ENCOUNTER — Ambulatory Visit (HOSPITAL_COMMUNITY): Payer: Medicare Other | Attending: Orthopedic Surgery | Admitting: Physical Therapy

## 2020-01-20 ENCOUNTER — Other Ambulatory Visit: Payer: Self-pay

## 2020-01-20 DIAGNOSIS — M545 Low back pain, unspecified: Secondary | ICD-10-CM

## 2020-01-20 DIAGNOSIS — R29898 Other symptoms and signs involving the musculoskeletal system: Secondary | ICD-10-CM | POA: Diagnosis not present

## 2020-01-20 NOTE — Patient Instructions (Signed)
Lower Trunk Rotation Stretch    Keeping back flat and feet together, rotate knees to left side. Hold __10__ seconds. Repeat _10___ times per set. Do __1__ sets per session. Do _1-2___ sessions per day.  http://orth.exer.us/122   Copyright  VHI. All rights reserved.

## 2020-01-20 NOTE — Therapy (Signed)
[facility] [address] [place], [place], [address] Phone: [phone]   Fax:  [phone]  Physical Therapy Evaluation  Patient Details  Name: Bruce Weber. MRN: [id number] Date of Birth: 1951/03/14 Referring Provider (PT): Arther Abbott, MD   Encounter Date: 01/20/2020  PT End of Session - 01/20/20 1534    Visit Number  1    Number of Visits  12    Date for PT Re-Evaluation  03/02/20    Authorization Type  Primary: Medicare; Secondary: AARP    Authorization Time Period  01/20/20 - 03/02/20 (Perform progress note on or by 10th visit)    Authorization - Visit Number  1    Authorization - Number of Visits  10    PT Start Time  1121    PT Stop Time  1205    PT Time Calculation (min)  44 min    Activity Tolerance  Patient tolerated treatment well;No increased pain    Behavior During Therapy  WFL for tasks assessed/performed       Past Medical History:  Diagnosis Date  . Adenoma 11/2009   1.1 cm from TCS  . DM (diabetes mellitus) ([place])   . GERD (gastroesophageal reflux disease)   . High cholesterol   . HTN (hypertension)   . Kidney stones   . Obesity (BMI 30-39.9) DEC 2011 223 LBS  . Rectal bleeding 2011   secondary to hemorrhoids    Past Surgical History:  Procedure Laterality Date  . CIRCUMCISION    . COLONOSCOPY  Dec 2010 [facility]   MOD IH, SIMPLE ADENOMA 1.1 CM  . COLONOSCOPY  11/25/2012   SLF: 1. Sessile polyp measuring 23mm in size was found at the hepatic flesure; polypectomy was performed using snare cautery 2. Moderate diverticulosis was noted in the ascending colon and sigmoid colon. 3. The colon mucosa was otherwise normal 4. Large internal hemorrhoids.   . COLONOSCOPY N/A 03/01/2017   Procedure: COLONOSCOPY;  Surgeon: Danie Binder, MD;  [facility];  Service: Endoscopy;  Laterality: N/A;  10:30 AM  . EYE SURGERY  05/2018   bilateral cataract surgery  . HERNIA REPAIR      There were no vitals  filed for this visit.   Subjective Assessment - 01/20/20 1133    Subjective  Patient reported that he has had back pain for the past 9 months which he stated moves from one side to the other of his low back. He reported that he did have an x-Brayln of his hips and showed some bone spurs, but that Dr. Aline Brochure believes that the pain is coming from his back. Patient also reported having a sharp pain in the left foot in the middle of the foot and feels like a point. Patient denied a history of having had diabetes, but that he has been told he is pre-diabetic. Patient reported that he retired at the end of October 2020. Reported that his back pain has gotten better since retiring from his job, which required him to sit most the day. Patient reported that he has noticed that his back hurts every morning when he wakes up. He reported that once he gets moving he can move pretty good. Denied any tingling or numbness in legs. Denied changes in bowel and bladder function. Patient reported sleeping on his back. Reported a maximum of 7/10 pain.    Limitations  Standing;Walking;Sitting    How long can you sit comfortably?  Tries to stand  up every 30 minutes    How long can you stand comfortably?  A couple hours    How long can you walk comfortably?  Half an hour    Diagnostic tests  X-rays of lumbar and hips    Patient Stated Goals  Have less pain    Currently in Pain?  No/denies         [facility] PT Assessment - 01/20/20 0001      Assessment   Medical Diagnosis  Lumbar Pain    Referring Provider (PT)  Arther Abbott, MD    Onset Date/Surgical Date  --   about 9 months   Next MD Visit  --   Possibly April   Prior Therapy  Yes, for neck      Precautions   Precautions  None      Restrictions   Weight Bearing Restrictions  No      Balance Screen   Has the patient fallen in the past 6 months  No    Has the patient had a decrease in activity level because of a fear of falling?   Yes    Is the patient  reluctant to leave their home because of a fear of falling?   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 WFL. IR 10% limited increase in pain at end ROM. Scour test negative    AROM Assessment Site  Lumbar    Right/Left Hip  --    Lumbar Flexion  25% limited    Lumbar Extension  75% limited    Lumbar - Right Side Bend  50% limited. Recreated back pain on that side.     Lumbar - Left Side Bend  50% limited. Recreates back pain on that side.     Lumbar - Right Rotation  50% limited    Lumbar - Left Rotation  50% limited      Strength   Strength Assessment Site  Hip;Knee;Ankle    Right/Left Hip  Right;Left    Right Hip Flexion  5/5    Right Hip Extension  4/5    Right Hip ABduction  5/5    Left Hip Flexion  5/5    Left Hip Extension  4/5  Left Hip ABduction  5/5    Right/Left Knee  Right;Left    Right Knee Flexion  5/5    Right Knee Extension  5/5    Left Knee Flexion  5/5    Left Knee Extension  5/5    Right/Left Ankle  Right;Left      Palpation   Spinal  mobility  General hypomobility throughout with CPAs. Recreation of LBP with CPAs of lumbar spine.    Palpation comment  Denied tenderness to palpation of gluteals. Some tenderness through lumbar spine.                 Objective measurements completed on examination: See above findings.              PT Education - 01/20/20 1427    Education Details  Patient was educated on examination findings, POC, and initial HEP.    Person(s) Educated  Patient    Methods  Explanation;Handout    Comprehension  Verbalized understanding       PT Short Term Goals - 01/20/20 1536      PT SHORT TERM GOAL #1   Title  Patient will report understanding and regular compliance with HEP to improve mobility and decrease pain.    Time  3    Period  Weeks    Status  New    Target Date  02/10/20        PT Long Term Goals - 01/20/20 1537      PT LONG TERM GOAL #1   Title  Patient will report that his back pain has not exceeded a 3/10 over the course of a 1 week period indicating improved tolerance to daily activities.    Time  6    Period  Weeks    Status  New    Target Date  03/02/20      PT LONG TERM GOAL #2   Title  Patient will demonstrate improvement on the FOTO assessment of at least 9% indicating improved percieved functional mobility.    Time  6    Period  Weeks    Status  New    Target Date  03/02/20      PT LONG TERM GOAL #3   Title  Patient will report improvement in overall symptoms of at least 75% to improve functional mobility and quality of life.    Time  6    Period  Weeks    Status  New    Target Date  03/02/20      PT LONG TERM GOAL #4   Title  Patient will demonstrate pain-free lumbar AROM in order to improve functional mobility.    Time  6    Period  Weeks    Status  New    Target Date  03/02/20             Plan - 01/20/20 1552    Clinical Impression Statement  Patient is a 69 year old male who presents to outpatient physical therapy with  primary concern of low back pain for the last 9 months. Patient presents with decreased lumbar AROM and pain with movement. In addition, patient demonstrated minimally decreased hip IR PROM with some increased pain. Overall, patient did demonstrate some decreases in LE strength, but is most limited by hypomobility through the spine. Patient would benefit from continued skilled physical therapy in order to address the abovementioned deficits and help patient return to his PLOF.    Personal Factors and  Comorbidities  Age;Fitness;Comorbidity 2    Comorbidities  HTN, Kidney disease    Examination-Activity Limitations  Lift;Stand;Locomotion Level;Transfers;Squat;Stairs    Examination-Participation Restrictions  Yard Work;Cleaning;Meal Prep;Community Activity;Shop    Stability/Clinical Decision Making  Stable/Uncomplicated    Clinical Decision Making  Low    Rehab Potential  Good    PT Frequency  2x / week    PT Duration  6 weeks    PT Treatment/Interventions  ADLs/Self Care Home Management;Aquatic Therapy;Cryotherapy;Electrical Stimulation;Moist Heat;Traction;DME Instruction;Gait training;Stair training;Functional mobility training;Therapeutic activities;Therapeutic exercise;Balance training;Neuromuscular re-education;Patient/family education;Orthotic Fit/Training;Manual techniques;Passive range of motion;Dry needling;Energy conservation;Taping    PT Next Visit Plan  Review eval/goals and initial HEP. Ask adv. directive questions, ab/lit questions. Focus on lumbar AROM and perform gentle lumbar traction.    PT Home Exercise Plan  01/20/20: LTR 10x10''    Consulted and Agree with Plan of Care  Patient       Patient will benefit from skilled therapeutic intervention in order to improve the following deficits and impairments:  Improper body mechanics, Pain, Decreased mobility, Postural dysfunction, Decreased activity tolerance, Decreased endurance, Decreased range of motion, Decreased strength,  Hypomobility  Visit Diagnosis: Bilateral low back pain, unspecified chronicity, unspecified whether sciatica present  Other symptoms and signs involving the musculoskeletal system     Problem List Patient Active Problem List   Diagnosis Date Noted  . Colon adenomas   . Insomnia 01/11/2016  . Internal hemorrhoids with complication 54/65/6812  . Obstructive sleep apnea 07/27/2013  . Seasonal and perennial allergic rhinitis 07/27/2013  . Hx of colonic polyps 11/15/2012  . UNSPECIFIED DISEASE OF THE JAWS 03/11/2010  . COLONIC POLYPS, ADENOMATOUS 06/30/2009  . HYPERLIPIDEMIA 10/13/2008  . GLOBUS HYSTERICUS 10/13/2008  . GASTROESOPHAGEAL REFLUX DISEASE 10/13/2008  . RENAL CALCULUS 10/13/2008  . Dysphagia, idiopathic 10/13/2008  . ABDOMINAL PAIN 10/13/2008  . H N P-LUMBAR 06/23/2008  . SPINAL STENOSIS 06/23/2008  . LOW BACK PAIN 06/23/2008   Clarene Critchley PT, DPT 3:58 PM, 01/20/20 [place], [place], [address] Phone: [phone]   Fax:  [phone]  Name: Stacy Deshler. MRN: [id number] Date of Birth: [birth year]

## 2020-01-27 ENCOUNTER — Other Ambulatory Visit: Payer: Self-pay

## 2020-01-27 ENCOUNTER — Encounter (HOSPITAL_COMMUNITY): Payer: Self-pay

## 2020-01-27 ENCOUNTER — Ambulatory Visit (HOSPITAL_COMMUNITY): Payer: Medicare Other

## 2020-01-27 DIAGNOSIS — M545 Low back pain, unspecified: Secondary | ICD-10-CM

## 2020-01-27 DIAGNOSIS — R29898 Other symptoms and signs involving the musculoskeletal system: Secondary | ICD-10-CM

## 2020-01-27 NOTE — Patient Instructions (Signed)
Knee to Chest (Flexion)    Pull knee toward chest. Feel stretch in lower back or buttock area. Breathing deeply, Hold 30  seconds. Repeat with other knee. Repeat 3 times. Do 2  sessions per day.  http://gt2.exer.us/225   Copyright  VHI. All rights reserved.   Bridging    Slowly raise buttocks from floor, keeping stomach tight. Repeat 10 times per set. Do 2 sets per session.   http://orth.exer.us/1096   Copyright  VHI. All rights reserved.    Posture - Sitting    Sit upright, head facing forward. Try using a roll to support lower back. Keep shoulders relaxed, and avoid rounded back. Keep hips level with knees. Avoid crossing legs for long periods.   Copyright  VHI. All rights reserved.

## 2020-01-27 NOTE — Therapy (Signed)
[facility] [place], [place], [address] Phone: [phone]   Fax:  [phone]  Physical Therapy Treatment  Patient Details  Name: Randel Hargens. MRN: [id number] Date of Birth: Apr 15, 1951 Referring Provider (PT): Arther Abbott, MD   Encounter Date: 01/27/2020  PT End of Session - 01/27/20 1123    Visit Number  2    Number of Visits  12    Date for PT Re-Evaluation  03/02/20    Authorization Type  Primary: Medicare; Secondary: AARP    Authorization Time Period  01/20/20 - 03/02/20 (Perform progress note on or by 10th visit)    Authorization - Visit Number  2    Authorization - Number of Visits  10    PT Start Time  1117    PT Stop Time  1157    PT Time Calculation (min)  40 min    Activity Tolerance  Patient tolerated treatment well;No increased pain    Behavior During Therapy  WFL for tasks assessed/performed       Past Medical History:  Diagnosis Date  . Adenoma 11/2009   1.1 cm from TCS  . DM (diabetes mellitus) ([place])   . GERD (gastroesophageal reflux disease)   . High cholesterol   . HTN (hypertension)   . Kidney stones   . Obesity (BMI 30-39.9) DEC 2011 223 LBS  . Rectal bleeding 2011   secondary to hemorrhoids    Past Surgical History:  Procedure Laterality Date  . CIRCUMCISION    . COLONOSCOPY  Dec 2010 [facility]   MOD IH, SIMPLE ADENOMA 1.1 CM  . COLONOSCOPY  11/25/2012   SLF: 1. Sessile polyp measuring 76mm in size was found at the hepatic flesure; polypectomy was performed using snare cautery 2. Moderate diverticulosis was noted in the ascending colon and sigmoid colon. 3. The colon mucosa was otherwise normal 4. Large internal hemorrhoids.   . COLONOSCOPY N/A 03/01/2017   Procedure: COLONOSCOPY;  Surgeon: Danie Binder, MD;  [facility];  Service: Endoscopy;  Laterality: N/A;  10:30 AM  . EYE SURGERY  05/2018   bilateral cataract surgery  . HERNIA REPAIR      There were no vitals  filed for this visit.  Subjective Assessment - 01/27/20 1115    Subjective  Pt stated he feels okay this morning, stated pain is worse in morning and feels better throughout the day.  Current pain scale 3-4/10 dull pain Rt side of lower back.  Reports compliance with stretches every morning.    Currently in Pain?  Yes    Pain Score  4     Pain Location  Back    Pain Orientation  Lower;Right    Pain Descriptors / Indicators  Dull    Pain Type  Chronic pain    Pain Onset  More than a month ago    Pain Frequency  Intermittent    Aggravating Factors   sleeping    Pain Relieving Factors  movements help    Effect of Pain on Daily Activities  not really                       [facility] Adult PT Treatment/Exercise - 01/27/20 0001      Posture/Postural Control   Posture/Postural Control  Postural limitations    Postural Limitations  Rounded Shoulders;Forward head      Exercises   Exercises  Lumbar      Lumbar  Exercises: Stretches   Single Knee to Chest Stretch  2 reps;30 seconds    Double Knee to Chest Stretch  3 reps;30 seconds    Lower Trunk Rotation  10 seconds    Lower Trunk Rotation Limitations  10 reps      Lumbar Exercises: Seated   Other Seated Lumbar Exercises  Educated seated posture    Other Seated Lumbar Exercises  lumbar roll x 3 minutes      Lumbar Exercises: Supine   Bridge  10 reps             PT Education - 01/27/20 1130    Education Details  Reviewed goals, educated on importance of compliance with HEP and assured compliance wiht proper form and mechanics with no cueing required.    Person(s) Educated  Patient    Methods  Explanation    Comprehension  Verbalized understanding;Returned demonstration       PT Short Term Goals - 01/20/20 1536      PT SHORT TERM GOAL #1   Title  Patient will report understanding and regular compliance with HEP to improve mobility and decrease pain.    Time  3    Period  Weeks    Status  New    Target Date   02/10/20        PT Long Term Goals - 01/20/20 1537      PT LONG TERM GOAL #1   Title  Patient will report that his back pain has not exceeded a 3/10 over the course of a 1 week period indicating improved tolerance to daily activities.    Time  6    Period  Weeks    Status  New    Target Date  03/02/20      PT LONG TERM GOAL #2   Title  Patient will demonstrate improvement on the FOTO assessment of at least 9% indicating improved percieved functional mobility.    Time  6    Period  Weeks    Status  New    Target Date  03/02/20      PT LONG TERM GOAL #3   Title  Patient will report improvement in overall symptoms of at least 75% to improve functional mobility and quality of life.    Time  6    Period  Weeks    Status  New    Target Date  03/02/20      PT LONG TERM GOAL #4   Title  Patient will demonstrate pain-free lumbar AROM in order to improve functional mobility.    Time  6    Period  Weeks    Status  New    Target Date  03/02/20            Plan - 01/27/20 1205    Clinical Impression Statement  Reviewed goals and assured compliance with HEP.  Pt able to demonstrate and verbalize appropriate mechanics with current HEP.  Session focus with lumbar mobility with additoinal stretches as well as education on importance of posture for pain control.  Pt educated on use of lumbar roll to assist with seated posture.  Pt given seated posture, SKTC and bridges additional to HEP.    Personal Factors and Comorbidities  Age;Fitness;Comorbidity 2    Comorbidities  HTN, Kidney disease    Examination-Activity Limitations  Lift;Stand;Locomotion Level;Transfers;Squat;Stairs    Examination-Participation Restrictions  Yard Work;Cleaning;Meal Prep;Community Activity;Shop    Stability/Clinical Decision Making  Stable/Uncomplicated    Clinical Decision  Making  Low    Rehab Potential  Good    PT Frequency  2x / week    PT Duration  6 weeks    PT Treatment/Interventions  ADLs/Self Care  Home Management;Aquatic Therapy;Cryotherapy;Electrical Stimulation;Moist Heat;Traction;DME Instruction;Gait training;Stair training;Functional mobility training;Therapeutic activities;Therapeutic exercise;Balance training;Neuromuscular re-education;Patient/family education;Orthotic Fit/Training;Manual techniques;Passive range of motion;Dry needling;Energy conservation;Taping    PT Next Visit Plan  Focus on lumbar AROM and perform gentle lumbar traction.    PT Home Exercise Plan  01/20/20: LTR 10x10''; 2/23: Seated posture/lumbar roll, SKTC, bridge       Patient will benefit from skilled therapeutic intervention in order to improve the following deficits and impairments:  Improper body mechanics, Pain, Decreased mobility, Postural dysfunction, Decreased activity tolerance, Decreased endurance, Decreased range of motion, Decreased strength, Hypomobility  Visit Diagnosis: Bilateral low back pain, unspecified chronicity, unspecified whether sciatica present  Other symptoms and signs involving the musculoskeletal system     Problem List Patient Active Problem List   Diagnosis Date Noted  . Colon adenomas   . Insomnia 01/11/2016  . Internal hemorrhoids with complication 52/84/1324  . Obstructive sleep apnea 07/27/2013  . Seasonal and perennial allergic rhinitis 07/27/2013  . Hx of colonic polyps 11/15/2012  . UNSPECIFIED DISEASE OF THE JAWS 03/11/2010  . COLONIC POLYPS, ADENOMATOUS 06/30/2009  . HYPERLIPIDEMIA 10/13/2008  . GLOBUS HYSTERICUS 10/13/2008  . GASTROESOPHAGEAL REFLUX DISEASE 10/13/2008  . RENAL CALCULUS 10/13/2008  . Dysphagia, idiopathic 10/13/2008  . ABDOMINAL PAIN 10/13/2008  . H N P-LUMBAR 06/23/2008  . SPINAL STENOSIS 06/23/2008  . LOW BACK PAIN 06/23/2008   Ihor Austin, [place]; [place]  Aldona Lento 01/27/2020, 12:10 PM  [facility] [address] [place], [place], [address] Phone: [phone]   Fax:   [phone]  Name: Beckem Tomberlin. MRN: [id number] Date of Birth: [birth year]

## 2020-01-29 ENCOUNTER — Ambulatory Visit: Payer: Medicare Other

## 2020-01-29 ENCOUNTER — Ambulatory Visit: Payer: Medicare Other | Attending: Internal Medicine

## 2020-01-29 DIAGNOSIS — Z23 Encounter for immunization: Secondary | ICD-10-CM | POA: Insufficient documentation

## 2020-01-29 NOTE — Progress Notes (Signed)
   Covid-19 Vaccination Clinic  Name:  Paris [place].    MRN: [id number] DOB: Feb 08, 1951  01/29/2020  Mr. Delay was observed post Covid-19 immunization for 15 minutes without incidence. He was provided with Vaccine Information Sheet and instruction to access the V-Safe system.   Mr. Kestler was instructed to call 911 with any severe reactions post vaccine: [place] Difficulty breathing  . Swelling of your face and throat  . A fast heartbeat  . A bad rash all over your body  . Dizziness and weakness    Immunizations Administered    Name Date Dose VIS Date Route   Pfizer COVID-19 Vaccine 01/29/2020  9:39 AM 0.3 mL 11/14/2019 Intramuscular   Manufacturer: [place]   Lot: [id number]   [place]: [id number]

## 2020-01-30 ENCOUNTER — Ambulatory Visit (HOSPITAL_COMMUNITY): Payer: Medicare Other | Admitting: Physical Therapy

## 2020-01-30 ENCOUNTER — Other Ambulatory Visit: Payer: Self-pay

## 2020-01-30 ENCOUNTER — Encounter (HOSPITAL_COMMUNITY): Payer: Self-pay | Admitting: Physical Therapy

## 2020-01-30 DIAGNOSIS — M545 Low back pain, unspecified: Secondary | ICD-10-CM

## 2020-01-30 DIAGNOSIS — R29898 Other symptoms and signs involving the musculoskeletal system: Secondary | ICD-10-CM | POA: Diagnosis not present

## 2020-01-30 NOTE — Therapy (Signed)
[place], [place], [address] Phone: [phone]   Fax:  [phone]  Physical Therapy Treatment  Patient Details  Name: Bruce Weber. MRN: [id number] Date of Birth: 21-Sep-1951 Referring Provider (PT): Arther Abbott, MD   Encounter Date: 01/30/2020  PT End of Session - 01/30/20 1211    Visit Number  3    Number of Visits  12    Date for PT Re-Evaluation  03/02/20    Authorization Type  Primary: Medicare; Secondary: AARP    Authorization Time Period  01/20/20 - 03/02/20 (Perform progress note on or by 10th visit)    Authorization - Visit Number  3    Authorization - Number of Visits  10    PT Start Time  1117    PT Stop Time  1200    PT Time Calculation (min)  43 min    Activity Tolerance  Patient tolerated treatment well;No increased pain    Behavior During Therapy  WFL for tasks assessed/performed       Past Medical History:  Diagnosis Date  . Adenoma 11/2009   1.1 cm from TCS  . DM (diabetes mellitus) ([place])   . GERD (gastroesophageal reflux disease)   . High cholesterol   . HTN (hypertension)   . Kidney stones   . Obesity (BMI 30-39.9) DEC 2011 223 LBS  . Rectal bleeding 2011   secondary to hemorrhoids    Past Surgical History:  Procedure Laterality Date  . CIRCUMCISION    . COLONOSCOPY  Dec 2010 [facility]   MOD IH, SIMPLE ADENOMA 1.1 CM  . COLONOSCOPY  11/25/2012   SLF: 1. Sessile polyp measuring 95mm in size was found at the hepatic flesure; polypectomy was performed using snare cautery 2. Moderate diverticulosis was noted in the ascending colon and sigmoid colon. 3. The colon mucosa was otherwise normal 4. Large internal hemorrhoids.   . COLONOSCOPY N/A 03/01/2017   Procedure: COLONOSCOPY;  Surgeon: Danie Binder, MD;  [facility];  Service: Endoscopy;  Laterality: N/A;  10:30 AM  . EYE SURGERY  05/2018   bilateral cataract surgery  . HERNIA REPAIR      There were no vitals  filed for this visit.  Subjective Assessment - 01/30/20 1121    Subjective  Patient reported his back is feeling pretty good today. He rates the pain as a 3/10 currently. He says he has tried to do his exercises at home sometimes.    Currently in Pain?  Yes    Pain Score  3     Pain Location  Back    Pain Orientation  Lower;Right    Pain Descriptors / Indicators  Dull    Pain Onset  More than a month ago                       [facility] Adult PT Treatment/Exercise - 01/30/20 0001      Posture/Postural Control   Posture/Postural Control  Postural limitations    Postural Limitations  Rounded Shoulders;Forward head      Lumbar Exercises: Stretches   Single Knee to Chest Stretch  2 reps;30 seconds    Double Knee to Chest Stretch  3 reps;30 seconds    Lower Trunk Rotation  10 seconds    Lower Trunk Rotation Limitations  10 reps      Lumbar Exercises: Supine   Bridge  10 reps      Manual  Therapy   Manual Therapy  Joint mobilization;Manual Traction    Manual therapy comments  All manual completed separately from other skilled interventions    Joint Mobilization  CPAs T6-L5 grade III oscillations    Manual Traction  Using physioball 1 minute x 5              PT Education - 01/30/20 1206    Education Details  Discussed purpose and technique of interventions throughout the session.    Person(s) Educated  Patient    Methods  Explanation    Comprehension  Verbalized understanding       PT Short Term Goals - 01/30/20 1210      PT SHORT TERM GOAL #1   Title  Patient will report understanding and regular compliance with HEP to improve mobility and decrease pain.    Time  3    Period  Weeks    Status  On-going    Target Date  02/10/20        PT Long Term Goals - 01/30/20 1210      PT LONG TERM GOAL #1   Title  Patient will report that his back pain has not exceeded a 3/10 over the course of a 1 week period indicating improved tolerance to daily activities.     Time  6    Period  Weeks    Status  On-going      PT LONG TERM GOAL #2   Title  Patient will demonstrate improvement on the FOTO assessment of at least 9% indicating improved percieved functional mobility.    Time  6    Period  Weeks    Status  On-going      PT LONG TERM GOAL #3   Title  Patient will report improvement in overall symptoms of at least 75% to improve functional mobility and quality of life.    Time  6    Period  Weeks    Status  On-going      PT LONG TERM GOAL #4   Title  Patient will demonstrate pain-free lumbar AROM in order to improve functional mobility.    Time  6    Period  Weeks    Status  On-going            Plan - 01/30/20 1208    Clinical Impression Statement  Focused on mobility this session. This session added gentle traction and thoracic/lumbar joint mobilizations. He tolerated the manual techniques well. Patient required minimal cues to improve form with exercises. Patient performed all exercises at a slow pace. He reported that he could feel the manual techniques some, but that he didn't have an increase in pain.    Personal Factors and Comorbidities  Age;Fitness;Comorbidity 2    Comorbidities  HTN, Kidney disease    Examination-Activity Limitations  Lift;Stand;Locomotion Level;Transfers;Squat;Stairs    Examination-Participation Restrictions  Yard Work;Cleaning;Meal Prep;Community Activity;Shop    Stability/Clinical Decision Making  Stable/Uncomplicated    Rehab Potential  Good    PT Frequency  2x / week    PT Duration  6 weeks    PT Treatment/Interventions  ADLs/Self Care Home Management;Aquatic Therapy;Cryotherapy;Electrical Stimulation;Moist Heat;Traction;DME Instruction;Gait training;Stair training;Functional mobility training;Therapeutic activities;Therapeutic exercise;Balance training;Neuromuscular re-education;Patient/family education;Orthotic Fit/Training;Manual techniques;Passive range of motion;Dry needling;Energy conservation;Taping     PT Next Visit Plan  F/U effect of manual. Continue if tolerated.    PT Home Exercise Plan  01/20/20: LTR 10x10''; 2/23: Seated posture/lumbar roll, SKTC, bridge       Patient  will benefit from skilled therapeutic intervention in order to improve the following deficits and impairments:  Improper body mechanics, Pain, Decreased mobility, Postural dysfunction, Decreased activity tolerance, Decreased endurance, Decreased range of motion, Decreased strength, Hypomobility  Visit Diagnosis: Bilateral low back pain, unspecified chronicity, unspecified whether sciatica present  Other symptoms and signs involving the musculoskeletal system     Problem List Patient Active Problem List   Diagnosis Date Noted  . Colon adenomas   . Insomnia 01/11/2016  . Internal hemorrhoids with complication 59/97/7414  . Obstructive sleep apnea 07/27/2013  . Seasonal and perennial allergic rhinitis 07/27/2013  . Hx of colonic polyps 11/15/2012  . UNSPECIFIED DISEASE OF THE JAWS 03/11/2010  . COLONIC POLYPS, ADENOMATOUS 06/30/2009  . HYPERLIPIDEMIA 10/13/2008  . GLOBUS HYSTERICUS 10/13/2008  . GASTROESOPHAGEAL REFLUX DISEASE 10/13/2008  . RENAL CALCULUS 10/13/2008  . Dysphagia, idiopathic 10/13/2008  . ABDOMINAL PAIN 10/13/2008  . H N P-LUMBAR 06/23/2008  . SPINAL STENOSIS 06/23/2008  . LOW BACK PAIN 06/23/2008   Clarene Critchley PT, DPT 12:13 PM, 01/30/20 [place], [place], [address] Phone: [phone]   Fax:  [phone]  Name: Yedidya Duddy. MRN: [id number] Date of Birth: 20-Sep-1951

## 2020-02-03 ENCOUNTER — Other Ambulatory Visit: Payer: Self-pay

## 2020-02-03 ENCOUNTER — Ambulatory Visit (HOSPITAL_COMMUNITY): Payer: Medicare Other | Attending: Orthopedic Surgery | Admitting: Physical Therapy

## 2020-02-03 ENCOUNTER — Encounter (HOSPITAL_COMMUNITY): Payer: Self-pay | Admitting: Physical Therapy

## 2020-02-03 DIAGNOSIS — R29898 Other symptoms and signs involving the musculoskeletal system: Secondary | ICD-10-CM | POA: Diagnosis not present

## 2020-02-03 DIAGNOSIS — M545 Low back pain, unspecified: Secondary | ICD-10-CM

## 2020-02-03 NOTE — Therapy (Addendum)
[place], [place], [address] Phone: [phone]   Fax:  [phone]  Physical Therapy Treatment  Patient Details  Name: Bruce Weber. MRN: [id number] Date of Birth: November 24, 1951 Referring Provider (PT): Arther Abbott, MD   Encounter Date: 02/03/2020  PT End of Session - 02/03/20 1128    Visit Number  4    Number of Visits  12    Date for PT Re-Evaluation  03/02/20    Authorization Type  Primary: Medicare; Secondary: AARP    Authorization Time Period  01/20/20 - 03/02/20 (Perform progress note on or by 10th visit)    Authorization - Visit Number  4    Authorization - Number of Visits  10    PT Start Time  1121    PT Stop Time  1200    PT Time Calculation (min)  39 min    Activity Tolerance  Patient tolerated treatment well;No increased pain    Behavior During Therapy  WFL for tasks assessed/performed       Past Medical History:  Diagnosis Date  . Adenoma 11/2009   1.1 cm from TCS  . DM (diabetes mellitus) ([place])   . GERD (gastroesophageal reflux disease)   . High cholesterol   . HTN (hypertension)   . Kidney stones   . Obesity (BMI 30-39.9) DEC 2011 223 LBS  . Rectal bleeding 2011   secondary to hemorrhoids    Past Surgical History:  Procedure Laterality Date  . CIRCUMCISION    . COLONOSCOPY  Dec 2010 [facility]   MOD IH, SIMPLE ADENOMA 1.1 CM  . COLONOSCOPY  11/25/2012   SLF: 1. Sessile polyp measuring 53mm in size was found at the hepatic flesure; polypectomy was performed using snare cautery 2. Moderate diverticulosis was noted in the ascending colon and sigmoid colon. 3. The colon mucosa was otherwise normal 4. Large internal hemorrhoids.   . COLONOSCOPY N/A 03/01/2017   Procedure: COLONOSCOPY;  Surgeon: Danie Binder, MD;  [facility];  Service: Endoscopy;  Laterality: N/A;  10:30 AM  . EYE SURGERY  05/2018   bilateral cataract surgery  . HERNIA REPAIR      There were no vitals  filed for this visit.  Subjective Assessment - 02/03/20 1126    Subjective  Patient reported he is having a little soreness today. He reported feeling pretty good overall and having felt good following last session.    Currently in Pain?  Yes    Pain Score  4     Pain Orientation  Lower;Right    Pain Descriptors / Indicators  Dull    Pain Type  Chronic pain    Pain Onset  More than a month ago                       [facility] Adult PT Treatment/Exercise - 02/03/20 0001      Lumbar Exercises: Stretches   Single Knee to Chest Stretch  30 seconds;3 reps    Single Knee to Chest Stretch Limitations  with towel    Double Knee to Chest Stretch  3 reps;30 seconds    Double Knee to Chest Stretch Limitations  with towel    Lower Trunk Rotation  10 seconds    Lower Trunk Rotation Limitations  10 reps    Other Lumbar Stretch Exercise  Thoracic book openers 5x10'' holds in sitting      Manual Therapy   Manual  Therapy  Joint mobilization    Manual therapy comments  All manual completed separately from other skilled interventions    Joint Mobilization  CPAs T6-L5 grade III oscillations               PT Short Term Goals - 01/30/20 1210      PT SHORT TERM GOAL #1   Title  Patient will report understanding and regular compliance with HEP to improve mobility and decrease pain.    Time  3    Period  Weeks    Status  On-going    Target Date  02/10/20        PT Long Term Goals - 01/30/20 1210      PT LONG TERM GOAL #1   Title  Patient will report that his back pain has not exceeded a 3/10 over the course of a 1 week period indicating improved tolerance to daily activities.    Time  6    Period  Weeks    Status  On-going      PT LONG TERM GOAL #2   Title  Patient will demonstrate improvement on the FOTO assessment of at least 9% indicating improved percieved functional mobility.    Time  6    Period  Weeks    Status  On-going      PT LONG TERM GOAL #3   Title   Patient will report improvement in overall symptoms of at least 75% to improve functional mobility and quality of life.    Time  6    Period  Weeks    Status  On-going      PT LONG TERM GOAL #4   Title  Patient will demonstrate pain-free lumbar AROM in order to improve functional mobility.    Time  6    Period  Weeks    Status  On-going            Plan - 02/03/20 1140    Clinical Impression Statement  Focused on mobility of the spine again this session. Continued with manual therapy including joint mobilizations to both the thoracic and lumbar spine as patient reported feeling good following the last session and having tolerated these treatments well. Added thoracic book openers this session for improved thoracic mobility. Patient reported having some discomfort on the right side with thoracic book openers which he said was similar to his usual pain, which indicates possible contribution of the thoracic spine in his low back pain. Patient would benefit from continued skilled physical therapy in order to progress towards achieving functional goals.    Personal Factors and Comorbidities  Age;Fitness;Comorbidity 2    Comorbidities  HTN, Kidney disease    Examination-Activity Limitations  Lift;Stand;Locomotion Level;Transfers;Squat;Stairs    Examination-Participation Restrictions  Yard Work;Cleaning;Meal Prep;Community Activity;Shop    Stability/Clinical Decision Making  Stable/Uncomplicated    Rehab Potential  Good    PT Frequency  2x / week    PT Duration  6 weeks    PT Treatment/Interventions  ADLs/Self Care Home Management;Aquatic Therapy;Cryotherapy;Electrical Stimulation;Moist Heat;Traction;DME Instruction;Gait training;Stair training;Functional mobility training;Therapeutic activities;Therapeutic exercise;Balance training;Neuromuscular re-education;Patient/family education;Orthotic Fit/Training;Manual techniques;Passive range of motion;Dry needling;Energy conservation;Taping    PT  Next Visit Plan  F/U effect of manual. Continue if tolerated. Assess rib mobility and for irritability of pain and consider rib mobilization.    PT Home Exercise Plan  01/20/20: LTR 10x10''; 2/23: Seated posture/lumbar roll, SKTC, bridge       Patient will benefit from skilled therapeutic intervention in  order to improve the following deficits and impairments:  Improper body mechanics, Pain, Decreased mobility, Postural dysfunction, Decreased activity tolerance, Decreased endurance, Decreased range of motion, Decreased strength, Hypomobility  Visit Diagnosis: Bilateral low back pain, unspecified chronicity, unspecified whether sciatica present  Other symptoms and signs involving the musculoskeletal system     Problem List Patient Active Problem List   Diagnosis Date Noted  . Colon adenomas   . Insomnia 01/11/2016  . Internal hemorrhoids with complication 34/74/2595  . Obstructive sleep apnea 07/27/2013  . Seasonal and perennial allergic rhinitis 07/27/2013  . Hx of colonic polyps 11/15/2012  . UNSPECIFIED DISEASE OF THE JAWS 03/11/2010  . COLONIC POLYPS, ADENOMATOUS 06/30/2009  . HYPERLIPIDEMIA 10/13/2008  . GLOBUS HYSTERICUS 10/13/2008  . GASTROESOPHAGEAL REFLUX DISEASE 10/13/2008  . RENAL CALCULUS 10/13/2008  . Dysphagia, idiopathic 10/13/2008  . ABDOMINAL PAIN 10/13/2008  . H N P-LUMBAR 06/23/2008  . SPINAL STENOSIS 06/23/2008  . LOW BACK PAIN 06/23/2008   Clarene Critchley PT, DPT 12:53 PM, 02/03/20 [place], [place], [address] Phone: [phone]   Fax:  [phone]  Name: Bruce Weber. MRN: [id number] Date of Birth: 11-29-1951

## 2020-02-06 ENCOUNTER — Encounter (HOSPITAL_COMMUNITY): Payer: Self-pay | Admitting: Physical Therapy

## 2020-02-06 ENCOUNTER — Other Ambulatory Visit: Payer: Self-pay

## 2020-02-06 ENCOUNTER — Ambulatory Visit (HOSPITAL_COMMUNITY): Payer: Medicare Other | Admitting: Physical Therapy

## 2020-02-06 DIAGNOSIS — M545 Low back pain, unspecified: Secondary | ICD-10-CM

## 2020-02-06 DIAGNOSIS — R29898 Other symptoms and signs involving the musculoskeletal system: Secondary | ICD-10-CM | POA: Diagnosis not present

## 2020-02-06 NOTE — Therapy (Addendum)
[place], [place], [address] Phone: [phone]   Fax:  [phone]  Physical Therapy Treatment  Patient Details  Name: Bruce Weber. MRN: [id number] Date of Birth: 24-Aug-1951 Referring Provider (PT): Arther Abbott, MD   Encounter Date: 02/06/2020  PT End of Session - 02/06/20 1452    Visit Number  5    Number of Visits  12    Date for PT Re-Evaluation  03/02/20    Authorization Type  Primary: Medicare; Secondary: AARP    Authorization Time Period  01/20/20 - 03/02/20 (Perform progress note on or by 10th visit)    Authorization - Visit Number  5    Authorization - Number of Visits  10    PT Start Time  1121    PT Stop Time  1203    PT Time Calculation (min)  42 min    Activity Tolerance  Patient tolerated treatment well;No increased pain    Behavior During Therapy  WFL for tasks assessed/performed       Past Medical History:  Diagnosis Date  . Adenoma 11/2009   1.1 cm from TCS  . DM (diabetes mellitus) ([place])   . GERD (gastroesophageal reflux disease)   . High cholesterol   . HTN (hypertension)   . Kidney stones   . Obesity (BMI 30-39.9) DEC 2011 223 LBS  . Rectal bleeding 2011   secondary to hemorrhoids    Past Surgical History:  Procedure Laterality Date  . CIRCUMCISION    . COLONOSCOPY  Dec 2010 [facility]   MOD IH, SIMPLE ADENOMA 1.1 CM  . COLONOSCOPY  11/25/2012   SLF: 1. Sessile polyp measuring 73mm in size was found at the hepatic flesure; polypectomy was performed using snare cautery 2. Moderate diverticulosis was noted in the ascending colon and sigmoid colon. 3. The colon mucosa was otherwise normal 4. Large internal hemorrhoids.   . COLONOSCOPY N/A 03/01/2017   Procedure: COLONOSCOPY;  Surgeon: Danie Binder, MD;  [facility];  Service: Endoscopy;  Laterality: N/A;  10:30 AM  . EYE SURGERY  05/2018   bilateral cataract surgery  . HERNIA REPAIR      There were no vitals  filed for this visit.  Subjective Assessment - 02/06/20 1124    Subjective  Patient reported he is still having some rib pain. He reported that he has had some soreness this morning in his back.    Currently in Pain?  Yes    Pain Score  5     Pain Location  Back    Pain Orientation  Lower;Right    Pain Descriptors / Indicators  Sore    Pain Onset  More than a month ago                       [facility] Adult PT Treatment/Exercise - 02/06/20 0001      Lumbar Exercises: Stretches   Other Lumbar Stretch Exercise  Thoracic book openers 5x10'' holds in sidelying    Other Lumbar Stretch Exercise  Seated lumbar flexion stretch with physioball 5x fwd, RT, LT 5'' holds      Lumbar Exercises: Standing   Other Standing Lumbar Exercises  Rows GTB x15      Manual Therapy   Manual Therapy  Joint mobilization    Joint Mobilization  Rib mobilizations incoporated with breathing grade II for pain relief 10x5''  PT Education - 02/06/20 1452    Education Details  Discussed deep breathing techniques and purpose in improving rib mobility.    Person(s) Educated  Patient    Methods  Explanation;Demonstration    Comprehension  Verbalized understanding       PT Short Term Goals - 01/30/20 1210      PT SHORT TERM GOAL #1   Title  Patient will report understanding and regular compliance with HEP to improve mobility and decrease pain.    Time  3    Period  Weeks    Status  On-going    Target Date  02/10/20        PT Long Term Goals - 01/30/20 1210      PT LONG TERM GOAL #1   Title  Patient will report that his back pain has not exceeded a 3/10 over the course of a 1 week period indicating improved tolerance to daily activities.    Time  6    Period  Weeks    Status  On-going      PT LONG TERM GOAL #2   Title  Patient will demonstrate improvement on the FOTO assessment of at least 9% indicating improved percieved functional mobility.    Time  6    Period  Weeks     Status  On-going      PT LONG TERM GOAL #3   Title  Patient will report improvement in overall symptoms of at least 75% to improve functional mobility and quality of life.    Time  6    Period  Weeks    Status  On-going      PT LONG TERM GOAL #4   Title  Patient will demonstrate pain-free lumbar AROM in order to improve functional mobility.    Time  6    Period  Weeks    Status  On-going            Plan - 02/06/20 1454    Clinical Impression Statement  Focused on mobility this session. Educated patient on the purpose and technique of the rib mobilizations with breathing and performed. Educated patient on how to incorporate deep breathing at home for improved rib excursion and mobility to decrease pain. Incorporated lumbar flexion stretches with physioball this session. Patient would benefit from continued skilled physical therapy to continue progressing towards functional goals.    Personal Factors and Comorbidities  Age;Fitness;Comorbidity 2    Comorbidities  HTN, Kidney disease    Examination-Activity Limitations  Lift;Stand;Locomotion Level;Transfers;Squat;Stairs    Examination-Participation Restrictions  Yard Work;Cleaning;Meal Prep;Community Activity;Shop    Stability/Clinical Decision Making  Stable/Uncomplicated    Rehab Potential  Good    PT Frequency  2x / week    PT Duration  6 weeks    PT Treatment/Interventions  ADLs/Self Care Home Management;Aquatic Therapy;Cryotherapy;Electrical Stimulation;Moist Heat;Traction;DME Instruction;Gait training;Stair training;Functional mobility training;Therapeutic activities;Therapeutic exercise;Balance training;Neuromuscular re-education;Patient/family education;Orthotic Fit/Training;Manual techniques;Passive range of motion;Dry needling;Energy conservation;Taping    PT Next Visit Plan  F/U about patient's deep breathing. Assess stability with walking. Perform FOTO for halfway update next session.    PT Home Exercise Plan  01/20/20:  LTR 10x10''; 2/23: Seated posture/lumbar roll, SKTC, bridge; 02/06/20: Deep breathing for rib mobility       Patient will benefit from skilled therapeutic intervention in order to improve the following deficits and impairments:  Improper body mechanics, Pain, Decreased mobility, Postural dysfunction, Decreased activity tolerance, Decreased endurance, Decreased range of motion, Decreased strength, Hypomobility  Visit Diagnosis:  Bilateral low back pain, unspecified chronicity, unspecified whether sciatica present  Other symptoms and signs involving the musculoskeletal system     Problem List Patient Active Problem List   Diagnosis Date Noted  . Colon adenomas   . Insomnia 01/11/2016  . Internal hemorrhoids with complication 36/64/4034  . Obstructive sleep apnea 07/27/2013  . Seasonal and perennial allergic rhinitis 07/27/2013  . Hx of colonic polyps 11/15/2012  . UNSPECIFIED DISEASE OF THE JAWS 03/11/2010  . COLONIC POLYPS, ADENOMATOUS 06/30/2009  . HYPERLIPIDEMIA 10/13/2008  . GLOBUS HYSTERICUS 10/13/2008  . GASTROESOPHAGEAL REFLUX DISEASE 10/13/2008  . RENAL CALCULUS 10/13/2008  . Dysphagia, idiopathic 10/13/2008  . ABDOMINAL PAIN 10/13/2008  . H N P-LUMBAR 06/23/2008  . SPINAL STENOSIS 06/23/2008  . LOW BACK PAIN 06/23/2008   Clarene Critchley PT, DPT 2:57 PM, 02/06/20 [facility] [address] [place], [place], [address] Phone: [phone]   Fax:  [phone]  Name: Refugio Vandevoorde. MRN: [id number] Date of Birth: 14-Sep-1951

## 2020-02-10 ENCOUNTER — Ambulatory Visit (HOSPITAL_COMMUNITY): Payer: Medicare Other | Admitting: Physical Therapy

## 2020-02-10 ENCOUNTER — Other Ambulatory Visit: Payer: Self-pay

## 2020-02-10 ENCOUNTER — Encounter (HOSPITAL_COMMUNITY): Payer: Self-pay | Admitting: Physical Therapy

## 2020-02-10 DIAGNOSIS — R29898 Other symptoms and signs involving the musculoskeletal system: Secondary | ICD-10-CM | POA: Diagnosis not present

## 2020-02-10 DIAGNOSIS — M545 Low back pain, unspecified: Secondary | ICD-10-CM

## 2020-02-10 NOTE — Therapy (Addendum)
[place], [place], [address] Phone: [phone]   Fax:  [phone]  Physical Therapy Treatment  Patient Details  Name: Bruce Weber. MRN: [id number] Date of Birth: 09/08/1951 Referring Provider (PT): Arther Abbott, MD   Encounter Date: 02/10/2020  PT End of Session - 02/10/20 1132    Visit Number  6    Number of Visits  12    Date for PT Re-Evaluation  03/02/20    Authorization Type  Primary: Medicare; Secondary: AARP    Authorization Time Period  01/20/20 - 03/02/20 (Perform progress note on or by 10th visit)    Authorization - Visit Number  6    Authorization - Number of Visits  10    PT Start Time  1127    PT Stop Time  1205    PT Time Calculation (min)  38 min    Activity Tolerance  Patient tolerated treatment well;No increased pain    Behavior During Therapy  WFL for tasks assessed/performed       Past Medical History:  Diagnosis Date  . Adenoma 11/2009   1.1 cm from TCS  . DM (diabetes mellitus) ([place])   . GERD (gastroesophageal reflux disease)   . High cholesterol   . HTN (hypertension)   . Kidney stones   . Obesity (BMI 30-39.9) DEC 2011 223 LBS  . Rectal bleeding 2011   secondary to hemorrhoids    Past Surgical History:  Procedure Laterality Date  . CIRCUMCISION    . COLONOSCOPY  Dec 2010 [facility]   MOD IH, SIMPLE ADENOMA 1.1 CM  . COLONOSCOPY  11/25/2012   SLF: 1. Sessile polyp measuring 37mm in size was found at the hepatic flesure; polypectomy was performed using snare cautery 2. Moderate diverticulosis was noted in the ascending colon and sigmoid colon. 3. The colon mucosa was otherwise normal 4. Large internal hemorrhoids.   . COLONOSCOPY N/A 03/01/2017   Procedure: COLONOSCOPY;  Surgeon: Danie Binder, MD;  [facility];  Service: Endoscopy;  Laterality: N/A;  10:30 AM  . EYE SURGERY  05/2018   bilateral cataract surgery  . HERNIA REPAIR      There were no vitals  filed for this visit.  Subjective Assessment - 02/10/20 1130    Subjective  Patient reported that following last session with the rib mobilizaions his ribs have not been bothering him. He reported that the right side of his back has been bothering him.    Currently in Pain?  Yes    Pain Score  3     Pain Location  Back    Pain Orientation  Lower;Right    Pain Descriptors / Indicators  Sore    Pain Onset  More than a month ago         [facility] PT Assessment - 02/10/20 0001      Observation/Other Assessments   Focus on Therapeutic Outcomes (FOTO)   57%   was 50%     Posture/Postural Control   Posture/Postural Control  Postural limitations    Postural Limitations  Rounded Shoulders;Forward head      Ambulation/Gait   Ambulation/Gait  Yes    Ambulation Distance (Feet)  200 Feet    Gait Pattern  Decreased trunk rotation;Decreased arm swing - right;Decreased arm swing - left    Gait Comments  Cues to increase armswing/trunk rotation  [facility] PT Treatment/Exercise - 02/10/20 0001      Lumbar Exercises: Standing   Other Standing Lumbar Exercises  Rows GTB 2x15    Other Standing Lumbar Exercises  Marching on foam with bilateral shoulder extension 2x10 GTB. Some intermittent unsteadiness. Pallof press GTB semi-tandem x 15 each LE fwd      Manual Therapy   Manual Therapy  Soft tissue mobilization    Manual therapy comments  All manual completed separately from other skilled interventions    Soft tissue mobilization  IASTM with massage gun using rounded piece for right lumbar paraspinals at frequency 14 and using pronged piece around lumbar and thoracic spine              PT Education - 02/10/20 1323    Education Details  Discussed purpose and technique of interventions throughout session.    Person(s) Educated  Patient    Methods  Explanation    Comprehension  Verbalized understanding       PT Short Term Goals - 01/30/20 1210      PT  SHORT TERM GOAL #1   Title  Patient will report understanding and regular compliance with HEP to improve mobility and decrease pain.    Time  3    Period  Weeks    Status  On-going    Target Date  02/10/20        PT Long Term Goals - 01/30/20 1210      PT LONG TERM GOAL #1   Title  Patient will report that his back pain has not exceeded a 3/10 over the course of a 1 week period indicating improved tolerance to daily activities.    Time  6    Period  Weeks    Status  On-going      PT LONG TERM GOAL #2   Title  Patient will demonstrate improvement on the FOTO assessment of at least 9% indicating improved percieved functional mobility.    Time  6    Period  Weeks    Status  On-going      PT LONG TERM GOAL #3   Title  Patient will report improvement in overall symptoms of at least 75% to improve functional mobility and quality of life.    Time  6    Period  Weeks    Status  On-going      PT LONG TERM GOAL #4   Title  Patient will demonstrate pain-free lumbar AROM in order to improve functional mobility.    Time  6    Period  Weeks    Status  On-going            Plan - 02/10/20 1336    Clinical Impression Statement  Began session by performing FOTO with patient with noted improvement since initial evaluation. Then performed soft tissue mobilization using massage gun to promote relaxation and improve mobility. Following manual therapy patient reported a reduction in his symptoms. Performed ambulation around the clinic monitoring patient's stability and gait pattern as patient had reported some difficulty with walking a in a straight line at times. Did note patient with decreased trunk rotation and decreased arm swing. Cued patient to improve this and incorporated some aspects of stability and balance challenge in to exercises. Added single leg stance marching with bilateral shoulder extension with theraband this session.    Personal Factors and Comorbidities   Age;Fitness;Comorbidity 2    Comorbidities  HTN, Kidney disease    Examination-Activity Limitations  Lift;Stand;Locomotion Level;Transfers;Squat;Stairs    Examination-Participation Restrictions  Yard Work;Cleaning;Meal Prep;Community Activity;Shop    Stability/Clinical Decision Making  Stable/Uncomplicated    Rehab Potential  Good    PT Frequency  2x / week    PT Duration  6 weeks    PT Treatment/Interventions  ADLs/Self Care Home Management;Aquatic Therapy;Cryotherapy;Electrical Stimulation;Moist Heat;Traction;DME Instruction;Gait training;Stair training;Functional mobility training;Therapeutic activities;Therapeutic exercise;Balance training;Neuromuscular re-education;Patient/family education;Orthotic Fit/Training;Manual techniques;Passive range of motion;Dry needling;Energy conservation;Taping    PT Next Visit Plan  F/U about patient's deep breathing. Assess stability with walking    PT Home Exercise Plan  01/20/20: LTR 10x10''; 2/23: Seated posture/lumbar roll, SKTC, bridge; 02/06/20: Deep breathing for rib mobility       Patient will benefit from skilled therapeutic intervention in order to improve the following deficits and impairments:  Improper body mechanics, Pain, Decreased mobility, Postural dysfunction, Decreased activity tolerance, Decreased endurance, Decreased range of motion, Decreased strength, Hypomobility  Visit Diagnosis: Bilateral low back pain, unspecified chronicity, unspecified whether sciatica present  Other symptoms and signs involving the musculoskeletal system     Problem List Patient Active Problem List   Diagnosis Date Noted  . Colon adenomas   . Insomnia 01/11/2016  . Internal hemorrhoids with complication 72/62/0355  . Obstructive sleep apnea 07/27/2013  . Seasonal and perennial allergic rhinitis 07/27/2013  . Hx of colonic polyps 11/15/2012  . UNSPECIFIED DISEASE OF THE JAWS 03/11/2010  . COLONIC POLYPS, ADENOMATOUS 06/30/2009  . HYPERLIPIDEMIA  10/13/2008  . GLOBUS HYSTERICUS 10/13/2008  . GASTROESOPHAGEAL REFLUX DISEASE 10/13/2008  . RENAL CALCULUS 10/13/2008  . Dysphagia, idiopathic 10/13/2008  . ABDOMINAL PAIN 10/13/2008  . H N P-LUMBAR 06/23/2008  . SPINAL STENOSIS 06/23/2008  . LOW BACK PAIN 06/23/2008   Clarene Critchley PT, DPT 1:40 PM, 02/10/20 [place] [address] [place], [place], [address] Phone: [phone]   Fax:  [phone]  Name: Bruce Weber. MRN: [id number] Date of Birth: Apr 23, 1951

## 2020-02-13 ENCOUNTER — Ambulatory Visit (HOSPITAL_COMMUNITY): Payer: Medicare Other | Admitting: Physical Therapy

## 2020-02-13 ENCOUNTER — Other Ambulatory Visit: Payer: Self-pay

## 2020-02-13 ENCOUNTER — Encounter (HOSPITAL_COMMUNITY): Payer: Self-pay | Admitting: Physical Therapy

## 2020-02-13 DIAGNOSIS — R29898 Other symptoms and signs involving the musculoskeletal system: Secondary | ICD-10-CM

## 2020-02-13 DIAGNOSIS — M545 Low back pain, unspecified: Secondary | ICD-10-CM

## 2020-02-13 NOTE — Therapy (Signed)
[place], [place], [address] Phone: [phone]   Fax:  [phone]  Physical Therapy Treatment  Patient Details  Name: Bruce Weber. MRN: [id number] Date of Birth: [birth year] Referring Provider (PT): Arther Abbott, MD   Encounter Date: 02/13/2020  PT End of Session - 02/13/20 1145    Visit Number  7    Number of Visits  12    Date for PT Re-Evaluation  03/02/20    Authorization Type  Primary: Medicare; Secondary: AARP    Authorization Time Period  01/20/20 - 03/02/20 (Perform progress note on or by 10th visit)    Authorization - Visit Number  7    Authorization - Number of Visits  10    PT Start Time  1120    PT Stop Time  1202    PT Time Calculation (min)  42 min    Activity Tolerance  Patient tolerated treatment well;No increased pain    Behavior During Therapy  WFL for tasks assessed/performed       Past Medical History:  Diagnosis Date  . Adenoma 11/2009   1.1 cm from TCS  . DM (diabetes mellitus) ([place])   . GERD (gastroesophageal reflux disease)   . High cholesterol   . HTN (hypertension)   . Kidney stones   . Obesity (BMI 30-39.9) DEC 2011 223 LBS  . Rectal bleeding 2011   secondary to hemorrhoids    Past Surgical History:  Procedure Laterality Date  . CIRCUMCISION    . COLONOSCOPY  Dec 2010 [facility]   MOD IH, SIMPLE ADENOMA 1.1 CM  . COLONOSCOPY  11/25/2012   SLF: 1. Sessile polyp measuring 3mm in size was found at the hepatic flesure; polypectomy was performed using snare cautery 2. Moderate diverticulosis was noted in the ascending colon and sigmoid colon. 3. The colon mucosa was otherwise normal 4. Large internal hemorrhoids.   . COLONOSCOPY N/A 03/01/2017   Procedure: COLONOSCOPY;  Surgeon: Danie Binder, MD;  [facility];  Service: Endoscopy;  Laterality: N/A;  10:30 AM  . EYE SURGERY  05/2018   bilateral cataract surgery  . HERNIA REPAIR      There were no vitals  filed for this visit.  Subjective Assessment - 02/13/20 1144    Subjective  Patient reported having felt good following the last session and that his back is feeling pretty good today. He stated that we was able to ambulate to the clinic this date.    Currently in Pain?  Yes    Pain Score  2     Pain Location  Back    Pain Orientation  Left;Lower    Pain Descriptors / Indicators  Sore    Pain Type  Chronic pain    Pain Onset  More than a month ago                       [facility] Adult PT Treatment/Exercise - 02/13/20 0001      Posture/Postural Control   Posture/Postural Control  Postural limitations    Postural Limitations  Rounded Shoulders;Forward head      Lumbar Exercises: Stretches   Other Lumbar Stretch Exercise  Thoracic book openers 5x10'' holds in sidelying    Other Lumbar Stretch Exercise  Seated lumbar flexion stretch with physioball 5x fwd, RT, LT 5'' holds      Lumbar Exercises: Standing   Other Standing Lumbar Exercises  Rows GTB 2x15  Other Standing Lumbar Exercises  Pallof press GTB 2x15      Manual Therapy   Manual Therapy  Soft tissue mobilization    Manual therapy comments  All manual completed separately from other skilled interventions    Soft tissue mobilization  IASTM with massage gun using rounded piece for right lumbar paraspinals at frequency 14 and using pronged piece around lumbar and thoracic spine                PT Short Term Goals - 01/30/20 1210      PT SHORT TERM GOAL #1   Title  Patient will report understanding and regular compliance with HEP to improve mobility and decrease pain.    Time  3    Period  Weeks    Status  On-going    Target Date  02/10/20        PT Long Term Goals - 01/30/20 1210      PT LONG TERM GOAL #1   Title  Patient will report that his back pain has not exceeded a 3/10 over the course of a 1 week period indicating improved tolerance to daily activities.    Time  6    Period  Weeks     Status  On-going      PT LONG TERM GOAL #2   Title  Patient will demonstrate improvement on the FOTO assessment of at least 9% indicating improved percieved functional mobility.    Time  6    Period  Weeks    Status  On-going      PT LONG TERM GOAL #3   Title  Patient will report improvement in overall symptoms of at least 75% to improve functional mobility and quality of life.    Time  6    Period  Weeks    Status  On-going      PT LONG TERM GOAL #4   Title  Patient will demonstrate pain-free lumbar AROM in order to improve functional mobility.    Time  6    Period  Weeks    Status  On-going            Plan - 02/13/20 1329    Clinical Impression Statement  Patient reported feeling good following last session and having decreased back pain this session. Began session with manual therapy as patient had reported a positive result with this. Then progressed to mobility exercises and postural strengthening exercises. This session added postural strengthening exercises to patient's HEP with GTB. Patient reported feeling good at the end of the session and planned to walk home from the clinic.    Personal Factors and Comorbidities  Age;Fitness;Comorbidity 2    Comorbidities  HTN, Kidney disease    Examination-Activity Limitations  Lift;Stand;Locomotion Level;Transfers;Squat;Stairs    Examination-Participation Restrictions  Yard Work;Cleaning;Meal Prep;Community Activity;Shop    Stability/Clinical Decision Making  Stable/Uncomplicated    Rehab Potential  Good    PT Frequency  2x / week    PT Duration  6 weeks    PT Treatment/Interventions  ADLs/Self Care Home Management;Aquatic Therapy;Cryotherapy;Electrical Stimulation;Moist Heat;Traction;DME Instruction;Gait training;Stair training;Functional mobility training;Therapeutic activities;Therapeutic exercise;Balance training;Neuromuscular re-education;Patient/family education;Orthotic Fit/Training;Manual techniques;Passive range of  motion;Dry needling;Energy conservation;Taping    PT Next Visit Plan  F/U about new HEP. continue lumbar and thoracic mobility exercises. Manual to decrease pain and improve mobility.    PT Home Exercise Plan  01/20/20: LTR 10x10''; 2/23: Seated posture/lumbar roll, SKTC, bridge; 02/06/20: Deep breathing for rib mobility; 02/13/20: Postural strengthening  GTB rows and pallof press    Consulted and Agree with Plan of Care  Patient       Patient will benefit from skilled therapeutic intervention in order to improve the following deficits and impairments:  Improper body mechanics, Pain, Decreased mobility, Postural dysfunction, Decreased activity tolerance, Decreased endurance, Decreased range of motion, Decreased strength, Hypomobility  Visit Diagnosis: Bilateral low back pain, unspecified chronicity, unspecified whether sciatica present  Other symptoms and signs involving the musculoskeletal system     Problem List Patient Active Problem List   Diagnosis Date Noted  . Colon adenomas   . Insomnia 01/11/2016  . Internal hemorrhoids with complication 86/16/8372  . Obstructive sleep apnea 07/27/2013  . Seasonal and perennial allergic rhinitis 07/27/2013  . Hx of colonic polyps 11/15/2012  . UNSPECIFIED DISEASE OF THE JAWS 03/11/2010  . COLONIC POLYPS, ADENOMATOUS 06/30/2009  . HYPERLIPIDEMIA 10/13/2008  . GLOBUS HYSTERICUS 10/13/2008  . GASTROESOPHAGEAL REFLUX DISEASE 10/13/2008  . RENAL CALCULUS 10/13/2008  . Dysphagia, idiopathic 10/13/2008  . ABDOMINAL PAIN 10/13/2008  . H N P-LUMBAR 06/23/2008  . SPINAL STENOSIS 06/23/2008  . LOW BACK PAIN 06/23/2008   Clarene Critchley PT, DPT 1:33 PM, 02/13/20 [place] [address] [place], [place], [address] Phone: [phone]   Fax:  [phone]  Name: Khoen Genet. MRN: [id number] Date of Birth: 1951/05/19

## 2020-02-17 ENCOUNTER — Ambulatory Visit (HOSPITAL_COMMUNITY): Payer: Medicare Other | Admitting: Physical Therapy

## 2020-02-17 ENCOUNTER — Encounter (HOSPITAL_COMMUNITY): Payer: Self-pay | Admitting: Physical Therapy

## 2020-02-17 ENCOUNTER — Other Ambulatory Visit: Payer: Self-pay

## 2020-02-17 DIAGNOSIS — M545 Low back pain, unspecified: Secondary | ICD-10-CM

## 2020-02-17 DIAGNOSIS — R29898 Other symptoms and signs involving the musculoskeletal system: Secondary | ICD-10-CM

## 2020-02-17 NOTE — Therapy (Signed)
[place], [place], [address] Phone: [phone]   Fax:  [phone]  Physical Therapy Treatment  Patient Details  Name: Bruce Weber. MRN: [id number] Date of Birth: 1951-03-01 Referring Provider (PT): Arther Abbott, MD   Encounter Date: 02/17/2020  PT End of Session - 02/17/20 1339    Visit Number  8    Number of Visits  12    Date for PT Re-Evaluation  03/02/20    Authorization Type  Primary: Medicare; Secondary: AARP    Authorization Time Period  01/20/20 - 03/02/20 (Perform progress note on or by 10th visit)    Authorization - Visit Number  8    Authorization - Number of Visits  10    PT Start Time  1121    PT Stop Time  1200    PT Time Calculation (min)  39 min    Equipment Utilized During Treatment  Gait belt    Activity Tolerance  Patient tolerated treatment well;No increased pain    Behavior During Therapy  WFL for tasks assessed/performed       Past Medical History:  Diagnosis Date  . Adenoma 11/2009   1.1 cm from TCS  . DM (diabetes mellitus) ([place])   . GERD (gastroesophageal reflux disease)   . High cholesterol   . HTN (hypertension)   . Kidney stones   . Obesity (BMI 30-39.9) DEC 2011 223 LBS  . Rectal bleeding 2011   secondary to hemorrhoids    Past Surgical History:  Procedure Laterality Date  . CIRCUMCISION    . COLONOSCOPY  Dec 2010 [facility]   MOD IH, SIMPLE ADENOMA 1.1 CM  . COLONOSCOPY  11/25/2012   SLF: 1. Sessile polyp measuring 77mm in size was found at the hepatic flesure; polypectomy was performed using snare cautery 2. Moderate diverticulosis was noted in the ascending colon and sigmoid colon. 3. The colon mucosa was otherwise normal 4. Large internal hemorrhoids.   . COLONOSCOPY N/A 03/01/2017   Procedure: COLONOSCOPY;  Surgeon: Danie Binder, MD;  [facility];  Service: Endoscopy;  Laterality: N/A;  10:30 AM  . EYE SURGERY  05/2018   bilateral cataract  surgery  . HERNIA REPAIR      There were no vitals filed for this visit.  Subjective Assessment - 02/17/20 1123    Subjective  Patient reported having about 2/10 pain this date.    Currently in Pain?  Yes    Pain Score  2     Pain Location  Back    Pain Orientation  Lower    Pain Descriptors / Indicators  Sore    Pain Type  Chronic pain    Pain Onset  More than a month ago                       [facility] Adult PT Treatment/Exercise - 02/17/20 0001      Posture/Postural Control   Posture/Postural Control  Postural limitations    Postural Limitations  Rounded Shoulders;Forward head      Lumbar Exercises: Stretches   Other Lumbar Stretch Exercise  Thoracic book openers 5x10'' holds in sidelying    Other Lumbar Stretch Exercise  Seated lumbar flexion stretch with physioball 5x fwd, RT, LT 5'' holds      Lumbar Exercises: Standing   Other Standing Lumbar Exercises  Tandem stance with a 2# bar lifting overhead x10 each LE    Other Standing  Lumbar Exercises  Pallof press GTB 2x15. Standing on foam Bil shoulder extension GTB x 20 on foam marching       Manual Therapy   Manual Therapy  Soft tissue mobilization    Manual therapy comments  All manual completed separately from other skilled interventions    Soft tissue mobilization  IASTM with massage gun using rounded piece for right lumbar paraspinals at frequency 14                PT Short Term Goals - 01/30/20 1210      PT SHORT TERM GOAL #1   Title  Patient will report understanding and regular compliance with HEP to improve mobility and decrease pain.    Time  3    Period  Weeks    Status  On-going    Target Date  02/10/20        PT Long Term Goals - 01/30/20 1210      PT LONG TERM GOAL #1   Title  Patient will report that his back pain has not exceeded a 3/10 over the course of a 1 week period indicating improved tolerance to daily activities.    Time  6    Period  Weeks    Status  On-going       PT LONG TERM GOAL #2   Title  Patient will demonstrate improvement on the FOTO assessment of at least 9% indicating improved percieved functional mobility.    Time  6    Period  Weeks    Status  On-going      PT LONG TERM GOAL #3   Title  Patient will report improvement in overall symptoms of at least 75% to improve functional mobility and quality of life.    Time  6    Period  Weeks    Status  On-going      PT LONG TERM GOAL #4   Title  Patient will demonstrate pain-free lumbar AROM in order to improve functional mobility.    Time  6    Period  Weeks    Status  On-going            Plan - 02/17/20 1340    Clinical Impression Statement  Continued with established POC this session focusing on improving patient's mobility and decreasing stiffness and pain. Patient demonstrated difficulty when performing foam marching with shoulder extension particularly when standing on the left lower extremity. Patient reported feeling good at the end of the session and having decreased pain. Patient would benefit from continued skilled physical therapy in order to continue progressing towards functional goals.    Personal Factors and Comorbidities  Age;Fitness;Comorbidity 2    Comorbidities  HTN, Kidney disease    Examination-Activity Limitations  Lift;Stand;Locomotion Level;Transfers;Squat;Stairs    Examination-Participation Restrictions  Yard Work;Cleaning;Meal Prep;Community Activity;Shop    Stability/Clinical Decision Making  Stable/Uncomplicated    Rehab Potential  Good    PT Frequency  2x / week    PT Duration  6 weeks    PT Treatment/Interventions  ADLs/Self Care Home Management;Aquatic Therapy;Cryotherapy;Electrical Stimulation;Moist Heat;Traction;DME Instruction;Gait training;Stair training;Functional mobility training;Therapeutic activities;Therapeutic exercise;Balance training;Neuromuscular re-education;Patient/family education;Orthotic Fit/Training;Manual techniques;Passive range of  motion;Dry needling;Energy conservation;Taping    PT Next Visit Plan  continue lumbar and thoracic mobility exercises. Manual to decrease pain and improve mobility.    PT Home Exercise Plan  01/20/20: LTR 10x10''; 2/23: Seated posture/lumbar roll, SKTC, bridge; 02/06/20: Deep breathing for rib mobility; 02/13/20: Postural strengthening GTB rows and pallof  press    Consulted and Agree with Plan of Care  Patient       Patient will benefit from skilled therapeutic intervention in order to improve the following deficits and impairments:  Improper body mechanics, Pain, Decreased mobility, Postural dysfunction, Decreased activity tolerance, Decreased endurance, Decreased range of motion, Decreased strength, Hypomobility  Visit Diagnosis: Bilateral low back pain, unspecified chronicity, unspecified whether sciatica present  Other symptoms and signs involving the musculoskeletal system     Problem List Patient Active Problem List   Diagnosis Date Noted  . Colon adenomas   . Insomnia 01/11/2016  . Internal hemorrhoids with complication 33/35/4562  . Obstructive sleep apnea 07/27/2013  . Seasonal and perennial allergic rhinitis 07/27/2013  . Hx of colonic polyps 11/15/2012  . UNSPECIFIED DISEASE OF THE JAWS 03/11/2010  . COLONIC POLYPS, ADENOMATOUS 06/30/2009  . HYPERLIPIDEMIA 10/13/2008  . GLOBUS HYSTERICUS 10/13/2008  . GASTROESOPHAGEAL REFLUX DISEASE 10/13/2008  . RENAL CALCULUS 10/13/2008  . Dysphagia, idiopathic 10/13/2008  . ABDOMINAL PAIN 10/13/2008  . H N P-LUMBAR 06/23/2008  . SPINAL STENOSIS 06/23/2008  . LOW BACK PAIN 06/23/2008   Clarene Critchley PT, DPT 1:43 PM, 02/17/20 [place], [place], [address] Phone: [phone]   Fax:  [phone]  Name: Loi Rennaker. MRN: [id number] Date of Birth: 1951/07/01

## 2020-02-20 ENCOUNTER — Telehealth (HOSPITAL_COMMUNITY): Payer: Self-pay | Admitting: Physical Therapy

## 2020-02-20 ENCOUNTER — Ambulatory Visit (HOSPITAL_COMMUNITY): Payer: Medicare Other | Admitting: Physical Therapy

## 2020-02-20 NOTE — Telephone Encounter (Signed)
Called regarding patient not showing for appointment this date. Left voicemail reminding patient of next scheduled appointment and hoping he was doing alright.  Clarene Critchley PT, DPT 7:05 PM, 02/20/20 [phone]

## 2020-02-24 ENCOUNTER — Encounter (HOSPITAL_COMMUNITY): Payer: Self-pay

## 2020-02-24 ENCOUNTER — Other Ambulatory Visit: Payer: Self-pay

## 2020-02-24 ENCOUNTER — Ambulatory Visit (HOSPITAL_COMMUNITY): Payer: Medicare Other

## 2020-02-24 DIAGNOSIS — E1122 Type 2 diabetes mellitus with diabetic chronic kidney disease: Secondary | ICD-10-CM | POA: Diagnosis not present

## 2020-02-24 DIAGNOSIS — I129 Hypertensive chronic kidney disease with stage 1 through stage 4 chronic kidney disease, or unspecified chronic kidney disease: Secondary | ICD-10-CM | POA: Diagnosis not present

## 2020-02-24 DIAGNOSIS — D631 Anemia in chronic kidney disease: Secondary | ICD-10-CM | POA: Diagnosis not present

## 2020-02-24 DIAGNOSIS — R29898 Other symptoms and signs involving the musculoskeletal system: Secondary | ICD-10-CM | POA: Diagnosis not present

## 2020-02-24 DIAGNOSIS — Z87442 Personal history of urinary calculi: Secondary | ICD-10-CM | POA: Diagnosis not present

## 2020-02-24 DIAGNOSIS — Z8739 Personal history of other diseases of the musculoskeletal system and connective tissue: Secondary | ICD-10-CM | POA: Diagnosis not present

## 2020-02-24 DIAGNOSIS — N2581 Secondary hyperparathyroidism of renal origin: Secondary | ICD-10-CM | POA: Diagnosis not present

## 2020-02-24 DIAGNOSIS — E669 Obesity, unspecified: Secondary | ICD-10-CM | POA: Diagnosis not present

## 2020-02-24 DIAGNOSIS — M545 Low back pain, unspecified: Secondary | ICD-10-CM

## 2020-02-24 DIAGNOSIS — N184 Chronic kidney disease, stage 4 (severe): Secondary | ICD-10-CM | POA: Diagnosis not present

## 2020-02-24 DIAGNOSIS — M1990 Unspecified osteoarthritis, unspecified site: Secondary | ICD-10-CM | POA: Diagnosis not present

## 2020-02-24 DIAGNOSIS — R809 Proteinuria, unspecified: Secondary | ICD-10-CM | POA: Diagnosis not present

## 2020-02-24 DIAGNOSIS — G4733 Obstructive sleep apnea (adult) (pediatric): Secondary | ICD-10-CM | POA: Diagnosis not present

## 2020-02-24 DIAGNOSIS — Z9989 Dependence on other enabling machines and devices: Secondary | ICD-10-CM | POA: Diagnosis not present

## 2020-02-24 NOTE — Therapy (Signed)
[place], [place], [address] Phone: [phone]   Fax:  [phone]  Physical Therapy Treatment  Patient Details  Name: Bruce Weber. MRN: [id number] Date of Birth: 01-05-1951 Referring Provider (PT): Arther Abbott, MD   Encounter Date: 02/24/2020  PT End of Session - 02/24/20 1116    Visit Number  9    Number of Visits  12    Date for PT Re-Evaluation  03/02/20    Authorization Type  Primary: Medicare; Secondary: AARP    Authorization Time Period  01/20/20 - 03/02/20 (Perform progress note on or by 10th visit)    Authorization - Visit Number  9    Authorization - Number of Visits  10    PT Start Time  1117    PT Stop Time  1202    PT Time Calculation (min)  45 min    Activity Tolerance  Patient tolerated treatment well;No increased pain    Behavior During Therapy  WFL for tasks assessed/performed       Past Medical History:  Diagnosis Date  . Adenoma 11/2009   1.1 cm from TCS  . DM (diabetes mellitus) ([place])   . GERD (gastroesophageal reflux disease)   . High cholesterol   . HTN (hypertension)   . Kidney stones   . Obesity (BMI 30-39.9) DEC 2011 223 LBS  . Rectal bleeding 2011   secondary to hemorrhoids    Past Surgical History:  Procedure Laterality Date  . CIRCUMCISION    . COLONOSCOPY  Dec 2010 [facility]   MOD IH, SIMPLE ADENOMA 1.1 CM  . COLONOSCOPY  11/25/2012   SLF: 1. Sessile polyp measuring 39mm in size was found at the hepatic flesure; polypectomy was performed using snare cautery 2. Moderate diverticulosis was noted in the ascending colon and sigmoid colon. 3. The colon mucosa was otherwise normal 4. Large internal hemorrhoids.   . COLONOSCOPY N/A 03/01/2017   Procedure: COLONOSCOPY;  Surgeon: Danie Binder, MD;  [facility];  Service: Endoscopy;  Laterality: N/A;  10:30 AM  . EYE SURGERY  05/2018   bilateral cataract surgery  . HERNIA REPAIR      There were no vitals  filed for this visit.  Subjective Assessment - 02/24/20 1119    Subjective  Pt reports unable to put weight on RLE but things slowly got better. Pt reports "feeling okay right now".    Limitations  Standing;Walking;Sitting    How long can you sit comfortably?  Tries to stand up every 30 minutes    How long can you stand comfortably?  A couple hours    How long can you walk comfortably?  Half an hour    Diagnostic tests  X-rays of lumbar and hips    Patient Stated Goals  Have less pain    Currently in Pain?  No/denies    Pain Onset  More than a month ago             [facility] Adult PT Treatment/Exercise - 02/24/20 0001      Lumbar Exercises: Stretches   Lower Trunk Rotation  5 reps;10 seconds    Figure 4 Stretch  30 seconds    Figure 4 Stretch Limitations  R side    Other Lumbar Stretch Exercise  Thoracic book openers 5x10'' holds in sidelying; seated thoracic rotation with dowel at chest and bolster between knees, 10x10 reps each direction      Lumbar Exercises: Standing  Other Standing Lumbar Exercises  tandem stance, BUE paloff press with 2# bar, x15 reps each leg back    Other Standing Lumbar Exercises  Pallof press with rotate away GTB x15 reps each direction; chops x10 reps each direction; lifts x10 reps each direction      Lumbar Exercises: Supine   Ab Set  20 reps    AB Set Limitations  with exhale    Pelvic Tilt  20 reps    Pelvic Tilt Limitations  anterior-posterior, neutral-posterior with heavy verbal cues             PT Education - 02/24/20 1209    Education Details  Exercise technique, updated HEP    Person(s) Educated  Patient    Methods  Explanation;Demonstration    Comprehension  Verbalized understanding;Returned demonstration       PT Short Term Goals - 01/30/20 1210      PT SHORT TERM GOAL #1   Title  Patient will report understanding and regular compliance with HEP to improve mobility and decrease pain.    Time  3    Period  Weeks    Status   On-going    Target Date  02/10/20        PT Long Term Goals - 01/30/20 1210      PT LONG TERM GOAL #1   Title  Patient will report that his back pain has not exceeded a 3/10 over the course of a 1 week period indicating improved tolerance to daily activities.    Time  6    Period  Weeks    Status  On-going      PT LONG TERM GOAL #2   Title  Patient will demonstrate improvement on the FOTO assessment of at least 9% indicating improved percieved functional mobility.    Time  6    Period  Weeks    Status  On-going      PT LONG TERM GOAL #3   Title  Patient will report improvement in overall symptoms of at least 75% to improve functional mobility and quality of life.    Time  6    Period  Weeks    Status  On-going      PT LONG TERM GOAL #4   Title  Patient will demonstrate pain-free lumbar AROM in order to improve functional mobility.    Time  6    Period  Weeks    Status  On-going            Plan - 02/24/20 1116    Clinical Impression Statement  Added LTR and seated thoracic rotation with dowel rod for increasing spinal mobility. Pt with lateral trunk lean to the L with tandem exercises, but able to upright self and come back to normal postural alignment. Added isometric core activation this date with exhale with good activation. Increased difficulty with posterior/anterior pelvic tilts requiring verbal and tactile cues and little movement. Pt denied pain at EOS. Continue to progress as able.    Personal Factors and Comorbidities  Age;Fitness;Comorbidity 2    Comorbidities  HTN, Kidney disease    Examination-Activity Limitations  Lift;Stand;Locomotion Level;Transfers;Squat;Stairs    Examination-Participation Restrictions  Yard Work;Cleaning;Meal Prep;Community Activity;Shop    Stability/Clinical Decision Making  Stable/Uncomplicated    Rehab Potential  Good    PT Frequency  2x / week    PT Duration  6 weeks    PT Treatment/Interventions  ADLs/Self Care Home  Management;Aquatic Therapy;Cryotherapy;Electrical Stimulation;Moist Heat;Traction;DME Instruction;Gait training;Stair  training;Functional mobility training;Therapeutic activities;Therapeutic exercise;Balance training;Neuromuscular re-education;Patient/family education;Orthotic Fit/Training;Manual techniques;Passive range of motion;Dry needling;Energy conservation;Taping    PT Next Visit Plan  Continue thoracic and lumbar mobility exercises. Manual to decrease pain and improve mobility.    PT Home Exercise Plan  01/20/20: LTR 10x10''; 2/23: Seated posture/lumbar roll, SKTC, bridge; 02/06/20: Deep breathing for rib mobility; 02/13/20: Postural strengthening GTB rows and pallof press; 3/23: posterior pelvic tilt    Consulted and Agree with Plan of Care  Patient       Patient will benefit from skilled therapeutic intervention in order to improve the following deficits and impairments:  Improper body mechanics, Pain, Decreased mobility, Postural dysfunction, Decreased activity tolerance, Decreased endurance, Decreased range of motion, Decreased strength, Hypomobility  Visit Diagnosis: Bilateral low back pain, unspecified chronicity, unspecified whether sciatica present  Other symptoms and signs involving the musculoskeletal system     Problem List Patient Active Problem List   Diagnosis Date Noted  . Colon adenomas   . Insomnia 01/11/2016  . Internal hemorrhoids with complication 67/34/1937  . Obstructive sleep apnea 07/27/2013  . Seasonal and perennial allergic rhinitis 07/27/2013  . Hx of colonic polyps 11/15/2012  . UNSPECIFIED DISEASE OF THE JAWS 03/11/2010  . COLONIC POLYPS, ADENOMATOUS 06/30/2009  . HYPERLIPIDEMIA 10/13/2008  . GLOBUS HYSTERICUS 10/13/2008  . GASTROESOPHAGEAL REFLUX DISEASE 10/13/2008  . RENAL CALCULUS 10/13/2008  . Dysphagia, idiopathic 10/13/2008  . ABDOMINAL PAIN 10/13/2008  . H N P-LUMBAR 06/23/2008  . SPINAL STENOSIS 06/23/2008  . LOW BACK PAIN 06/23/2008       Talbot Grumbling PT, DPT 02/24/20, 12:10 PM [facility] [address] [place], [place], [address] Phone: [phone]   Fax:  [phone]  Name: Nahum Sherrer. MRN: [id number] Date of Birth: March 24, 1951

## 2020-02-25 ENCOUNTER — Ambulatory Visit: Payer: Medicare Other | Attending: Internal Medicine

## 2020-02-25 DIAGNOSIS — Z23 Encounter for immunization: Secondary | ICD-10-CM

## 2020-02-25 NOTE — Progress Notes (Signed)
   Covid-19 Vaccination Clinic  Name:  Calyb Mcquarrie.    MRN: [id number] DOB: [birth year]  02/25/2020  Mr. Sevey was observed post Covid-19 immunization for 15 minutes without incident. He was provided with Vaccine Information Sheet and instruction to access the V-Safe system.   Mr. Reece was instructed to call 911 with any severe reactions post vaccine: Marland Kitchen Difficulty breathing  . Swelling of face and throat  . A fast heartbeat  . A bad rash all over body  . Dizziness and weakness   Immunizations Administered    Name Date Dose VIS Date Route   Pfizer COVID-19 Vaccine 02/25/2020  9:28 AM 0.3 mL 11/14/2019 Intramuscular   Manufacturer: [place]   Lot: [id number]   [place]: [id number]

## 2020-02-27 ENCOUNTER — Encounter (HOSPITAL_COMMUNITY): Payer: Self-pay | Admitting: Physical Therapy

## 2020-02-27 ENCOUNTER — Ambulatory Visit (HOSPITAL_COMMUNITY): Payer: Medicare Other | Admitting: Physical Therapy

## 2020-02-27 ENCOUNTER — Other Ambulatory Visit: Payer: Self-pay

## 2020-02-27 DIAGNOSIS — R29898 Other symptoms and signs involving the musculoskeletal system: Secondary | ICD-10-CM | POA: Diagnosis not present

## 2020-02-27 DIAGNOSIS — M545 Low back pain, unspecified: Secondary | ICD-10-CM

## 2020-02-27 NOTE — Therapy (Signed)
[facility] [facility] [address] [place], [place], [address] Phone: [phone]   Fax:  [phone]  Physical Therapy Treatment / Progress Note  Patient Details  Name: Bruce Weber. MRN: [id number] Date of Birth: [birth year] Referring Provider (PT): Arther Abbott, MD   Encounter Date: 02/27/2020   Progress Note Reporting Period 01/20/20 to 02/27/20  See note below for Objective Data and Assessment of Progress/Goals.       PT End of Session - 02/27/20 1126    Visit Number  10    Number of Visits  16    Date for PT Re-Evaluation  03/19/20    Authorization Type  Primary: Medicare; Secondary: AARP    Authorization Time Period  01/20/20 - 03/02/20 (Perform progress note on or by 10th visit)    Progress Note Due on Visit  20    PT Start Time  1120    PT Stop Time  1158    PT Time Calculation (min)  38 min    Activity Tolerance  Patient tolerated treatment well;No increased pain    Behavior During Therapy  WFL for tasks assessed/performed       Past Medical History:  Diagnosis Date  . Adenoma 11/2009   1.1 cm from TCS  . DM (diabetes mellitus) ([place])   . GERD (gastroesophageal reflux disease)   . High cholesterol   . HTN (hypertension)   . Kidney stones   . Obesity (BMI 30-39.9) DEC 2011 223 LBS  . Rectal bleeding 2011   secondary to hemorrhoids    Past Surgical History:  Procedure Laterality Date  . CIRCUMCISION    . COLONOSCOPY  Dec 2010 [facility]   MOD IH, SIMPLE ADENOMA 1.1 CM  . COLONOSCOPY  11/25/2012   SLF: 1. Sessile polyp measuring 25m in size was found at the hepatic flesure; polypectomy was performed using snare cautery 2. Moderate diverticulosis was noted in the ascending colon and sigmoid colon. 3. The colon mucosa was otherwise normal 4. Large internal hemorrhoids.   . COLONOSCOPY N/A 03/01/2017   Procedure: COLONOSCOPY;  Surgeon: SDanie Binder MD;  [facility];  Service: Endoscopy;  Laterality: N/A;   10:30 AM  . EYE SURGERY  05/2018   bilateral cataract surgery  . HERNIA REPAIR      There were no vitals filed for this visit.  Subjective Assessment - 02/27/20 1122    Subjective  Patient reported feeling good today. He stated at his pain is a 4/10 currently. Reported a 4/10 pain maximum over the last week. He reported a 50% improvement since beginning therapy.    Limitations  Standing;Walking;Sitting    How long can you sit comfortably?  Tries to stand up every 30 minutes    How long can you stand comfortably?  A couple hours    How long can you walk comfortably?  Half an hour    Diagnostic tests  X-rays of lumbar and hips    Patient Stated Goals  Have less pain    Currently in Pain?  Yes    Pain Score  4     Pain Location  Back    Pain Orientation  Lower    Pain Descriptors / Indicators  Sore    Pain Onset  More than a month ago         [facility] - 02/27/20 0001      Assessment   Medical Diagnosis  Lumbar Pain    Referring  Provider (PT)  Arther Abbott, MD      Precautions   Precautions  None      Restrictions   Weight Bearing Restrictions  No      Prior Function   Level of Independence  Independent    Vocation  Retired      Observation/Other Assessments   Focus on Therapeutic Outcomes (FOTO)   53%   was 50% then 57%     AROM   Lumbar Flexion  10% limited   was 25% limited   Lumbar Extension  75% limited   was 75% limited   Lumbar - Right Side Bend  25% limited   was 50% limited and recreated back pain. Slight pain.   Lumbar - Left Side Bend  25% limited. Slight pain   Was 50% limited. Recreated pain.   Lumbar - Right Rotation  25% limited   was 50% limited.    Lumbar - Left Rotation  25% limited   was 50% limited                  [facility] Adult PT Treatment/Exercise - 02/27/20 0001      Lumbar Exercises: Stretches   Figure 4 Stretch  30 seconds    Figure 4 Stretch Limitations  R side    Other Lumbar Stretch Exercise  Thoracic  book openers 5x10'' holds in sidelying; seated thoracic rotation with dowel at chest and bolster between knees, 10x10 reps each direction      Lumbar Exercises: Standing   Other Standing Lumbar Exercises  tandem stance, BUE paloff press with 2# bar, x15 reps each leg back    Other Standing Lumbar Exercises  Pallof press with rotate away GTB x15 reps each direction; chops x10 reps each direction; lifts x10 reps each direction GTB             PT Education - 02/27/20 1238    Education Details  Discussed examination findings and continuing therapy for an additional 3 weeks.    Person(s) Educated  Patient    Methods  Explanation    Comprehension  Verbalized understanding       PT Short Term Goals - 02/27/20 1123      PT SHORT TERM GOAL #1   Title  Patient will report understanding and regular compliance with HEP to improve mobility and decrease pain.    Time  3    Period  Weeks    Status  Achieved    Target Date  02/10/20        PT Long Term Goals - 02/27/20 1123      PT LONG TERM GOAL #1   Title  Patient will report that his back pain has not exceeded a 3/10 over the course of a 1 week period indicating improved tolerance to daily activities.    Baseline  02/27/20: Maximum of 4/10 pain over the last week    Time  3    Period  Weeks    Status  On-going    Target Date  03/19/20      PT LONG TERM GOAL #2   Title  Patient will demonstrate improvement on the FOTO assessment of at least 9% indicating improved percieved functional mobility.    Baseline  02/27/20: See objective measures    Time  3    Period  Weeks    Status  On-going    Target Date  03/19/20      PT LONG TERM GOAL #3  Title  Patient will report improvement in overall symptoms of at least 75% to improve functional mobility and quality of life.    Baseline  02/27/20: 50% improvement    Time  3    Period  Weeks    Status  Partially Met    Target Date  03/19/20      PT LONG TERM GOAL #4   Title  Patient  will demonstrate pain-free lumbar AROM in order to improve functional mobility.    Baseline  02/27/20: See objective measures    Time  3    Period  Weeks    Status  Partially Met    Target Date  03/19/20            Plan - 02/27/20 1245    Clinical Impression Statement  Performed a re-assessment of patient's progress towards goals this session. Patient achieved 1 out of 1 short term goals. Patient partially met 2 out of 4 long term goals. Patient has demonstrated improvements in lumbar AROM and has reported decreased maximum pain scores. Continued with mobility exercises for thoracic spine this session as well as core strengthening exercises. Patient would benefit from continued skilled physical therapy to continue progressing towards achieving functional goals.    Personal Factors and Comorbidities  Age;Fitness;Comorbidity 2    Comorbidities  HTN, Kidney disease    Examination-Activity Limitations  Lift;Stand;Locomotion Level;Transfers;Squat;Stairs    Examination-Participation Restrictions  Yard Work;Cleaning;Meal Prep;Community Activity;Shop    Stability/Clinical Decision Making  Stable/Uncomplicated    Rehab Potential  Good    PT Frequency  2x / week    PT Duration  3 weeks    PT Treatment/Interventions  ADLs/Self Care Home Management;Aquatic Therapy;Cryotherapy;Electrical Stimulation;Moist Heat;Traction;DME Instruction;Gait training;Stair training;Functional mobility training;Therapeutic activities;Therapeutic exercise;Balance training;Neuromuscular re-education;Patient/family education;Orthotic Fit/Training;Manual techniques;Passive range of motion;Dry needling;Energy conservation;Taping    PT Next Visit Plan  Continue thoracic and lumbar mobility exercises. Manual to decrease pain and improve mobility PRN    PT Home Exercise Plan  01/20/20: LTR 10x10''; 2/23: Seated posture/lumbar roll, SKTC, bridge; 02/06/20: Deep breathing for rib mobility; 02/13/20: Postural strengthening GTB rows and  pallof press; 3/23: posterior pelvic tilt    Consulted and Agree with Plan of Care  Patient       Patient will benefit from skilled therapeutic intervention in order to improve the following deficits and impairments:  Improper body mechanics, Pain, Decreased mobility, Postural dysfunction, Decreased activity tolerance, Decreased endurance, Decreased range of motion, Decreased strength, Hypomobility  Visit Diagnosis: Bilateral low back pain, unspecified chronicity, unspecified whether sciatica present  Other symptoms and signs involving the musculoskeletal system     Problem List Patient Active Problem List   Diagnosis Date Noted  . Colon adenomas   . Insomnia 01/11/2016  . Internal hemorrhoids with complication 19/14/7829  . Obstructive sleep apnea 07/27/2013  . Seasonal and perennial allergic rhinitis 07/27/2013  . Hx of colonic polyps 11/15/2012  . UNSPECIFIED DISEASE OF THE JAWS 03/11/2010  . COLONIC POLYPS, ADENOMATOUS 06/30/2009  . HYPERLIPIDEMIA 10/13/2008  . GLOBUS HYSTERICUS 10/13/2008  . GASTROESOPHAGEAL REFLUX DISEASE 10/13/2008  . RENAL CALCULUS 10/13/2008  . Dysphagia, idiopathic 10/13/2008  . ABDOMINAL PAIN 10/13/2008  . H N P-LUMBAR 06/23/2008  . SPINAL STENOSIS 06/23/2008  . LOW BACK PAIN 06/23/2008   Clarene Critchley PT, DPT 12:48 PM, 02/27/20 [place], [place], [address] Phone: [phone]   Fax:  [phone]  Name: Kert Shackett. MRN: [id number] Date of  Birth: 1951/03/12

## 2020-03-02 ENCOUNTER — Other Ambulatory Visit: Payer: Self-pay

## 2020-03-02 ENCOUNTER — Ambulatory Visit (HOSPITAL_COMMUNITY): Payer: Medicare Other | Admitting: Physical Therapy

## 2020-03-02 ENCOUNTER — Encounter (HOSPITAL_COMMUNITY): Payer: Self-pay | Admitting: Physical Therapy

## 2020-03-02 DIAGNOSIS — M545 Low back pain, unspecified: Secondary | ICD-10-CM

## 2020-03-02 DIAGNOSIS — R29898 Other symptoms and signs involving the musculoskeletal system: Secondary | ICD-10-CM

## 2020-03-02 NOTE — Therapy (Signed)
[place], [place], [address] Phone: [phone]   Fax:  [phone]  Physical Therapy Treatment  Patient Details  Name: Bruce Weber. MRN: [id number] Date of Birth: August 31, 1951 Referring Provider (PT): Arther Abbott, MD   Encounter Date: 03/02/2020  PT End of Session - 03/02/20 1239    Visit Number  11    Number of Visits  16    Date for PT Re-Evaluation  03/19/20    Authorization Type  Primary: Medicare; Secondary: AARP    Authorization Time Period  01/20/20 - 03/02/20 (Perform progress note on or by 10th visit)    Progress Note Due on Visit  20    PT Start Time  1123    PT Stop Time  1202    PT Time Calculation (min)  39 min    Activity Tolerance  Patient tolerated treatment well;No increased pain    Behavior During Therapy  WFL for tasks assessed/performed       Past Medical History:  Diagnosis Date  . Adenoma 11/2009   1.1 cm from TCS  . DM (diabetes mellitus) ([place])   . GERD (gastroesophageal reflux disease)   . High cholesterol   . HTN (hypertension)   . Kidney stones   . Obesity (BMI 30-39.9) DEC 2011 223 LBS  . Rectal bleeding 2011   secondary to hemorrhoids    Past Surgical History:  Procedure Laterality Date  . CIRCUMCISION    . COLONOSCOPY  Dec 2010 [facility]   MOD IH, SIMPLE ADENOMA 1.1 CM  . COLONOSCOPY  11/25/2012   SLF: 1. Sessile polyp measuring 49m in size was found at the hepatic flesure; polypectomy was performed using snare cautery 2. Moderate diverticulosis was noted in the ascending colon and sigmoid colon. 3. The colon mucosa was otherwise normal 4. Large internal hemorrhoids.   . COLONOSCOPY N/A 03/01/2017   Procedure: COLONOSCOPY;  Surgeon: SDanie Binder MD;  [facility];  Service: Endoscopy;  Laterality: N/A;  10:30 AM  . EYE SURGERY  05/2018   bilateral cataract surgery  . HERNIA REPAIR      There were no vitals filed for this visit.  Subjective  Assessment - 03/02/20 1124    Subjective  Patient reported having some back stiffness which he rated as a 5/10 currently.    Limitations  Standing;Walking;Sitting    How long can you sit comfortably?  Tries to stand up every 30 minutes    How long can you stand comfortably?  A couple hours    How long can you walk comfortably?  Half an hour    Diagnostic tests  X-rays of lumbar and hips    Patient Stated Goals  Have less pain    Currently in Pain?  Yes    Pain Score  5     Pain Location  Back    Pain Orientation  Lower    Pain Descriptors / Indicators  Other (Comment)   stiffness   Pain Onset  More than a month ago                       [facility] - 03/02[facility]20 0001      Lumbar Exercises: Stretches   Figure 4 Stretch  30 seconds;3 reps    Figure 4 Stretch Limitations  RT side    Other Lumbar Stretch Exercise  Thoracic book openers 5x10'' holds in sidelying; seated thoracic rotation  with dowel at chest and bolster between knees, 10''x10 reps each direction      Lumbar Exercises: Standing   Other Standing Lumbar Exercises  tandem stance, BUE paloff press with 2# bar, x15 reps each leg back    Other Standing Lumbar Exercises  Pallof press with rotate away GTB x15 reps each direction; chops x10 reps each direction; lifts x10 reps each direction GTB      Lumbar Exercises: Quadruped   Other Quadruped Lumbar Exercises  Cat/cow 10x10'' holds each      Manual Therapy   Manual Therapy  Soft tissue mobilization    Manual therapy comments  All manual completed separately from other skilled interventions    Soft tissue mobilization  IASTM with green weighted ball to bilateral lumbar paraspinals. Patient in prone.                PT Short Term Goals - 02/27/20 1123      PT SHORT TERM GOAL #1   Title  Patient will report understanding and regular compliance with HEP to improve mobility and decrease pain.    Time  3    Period  Weeks    Status   Achieved    Target Date  02/10/20        PT Long Term Goals - 02/27/20 1123      PT LONG TERM GOAL #1   Title  Patient will report that his back pain has not exceeded a 3/10 over the course of a 1 week period indicating improved tolerance to daily activities.    Baseline  02/27/20: Maximum of 4/10 pain over the last week    Time  3    Period  Weeks    Status  On-going    Target Date  03/19/20      PT LONG TERM GOAL #2   Title  Patient will demonstrate improvement on the FOTO assessment of at least 9% indicating improved percieved functional mobility.    Baseline  02/27/20: See objective measures    Time  3    Period  Weeks    Status  On-going    Target Date  03/19/20      PT LONG TERM GOAL #3   Title  Patient will report improvement in overall symptoms of at least 75% to improve functional mobility and quality of life.    Baseline  02/27/20: 50% improvement    Time  3    Period  Weeks    Status  Partially Met    Target Date  03/19/20      PT LONG TERM GOAL #4   Title  Patient will demonstrate pain-free lumbar AROM in order to improve functional mobility.    Baseline  02/27/20: See objective measures    Time  3    Period  Weeks    Status  Partially Met    Target Date  03/19/20            Plan - 03/02/20 1252    Clinical Impression Statement  Continued to focus on improving spinal mobility this session. This session added cat/cow stretch in quadruped which patient required frequent cues to improve form and tactile cues to increase mobility with this. Patient did require cueing for pallof press exercise as well as with lift and chop exercises this session. Ended with manual therapy this session.    Personal Factors and Comorbidities  Age;Fitness;Comorbidity 2    Comorbidities  HTN, Kidney disease    Examination-Activity  Limitations  Lift;Stand;Locomotion Level;Transfers;Squat;Stairs    Examination-Participation Restrictions  Yard Work;Cleaning;Meal Prep;Community  Activity;Shop    Stability/Clinical Decision Making  Stable/Uncomplicated    Rehab Potential  Good    PT Frequency  2x / week    PT Duration  3 weeks    PT Treatment/Interventions  ADLs/Self Care Home Management;Aquatic Therapy;Cryotherapy;Electrical Stimulation;Moist Heat;Traction;DME Instruction;Gait training;Stair training;Functional mobility training;Therapeutic activities;Therapeutic exercise;Balance training;Neuromuscular re-education;Patient/family education;Orthotic Fit/Training;Manual techniques;Passive range of motion;Dry needling;Energy conservation;Taping    PT Next Visit Plan  Continue thoracic and lumbar mobility exercises. Manual to decrease pain and improve mobility PRN    PT Home Exercise Plan  01/20/20: LTR 10x10''; 2/23: Seated posture/lumbar roll, SKTC, bridge; 02/06/20: Deep breathing for rib mobility; 02/13/20: Postural strengthening GTB rows and pallof press; 3/23: posterior pelvic tilt    Consulted and Agree with Plan of Care  Patient       Patient will benefit from skilled therapeutic intervention in order to improve the following deficits and impairments:  Improper body mechanics, Pain, Decreased mobility, Postural dysfunction, Decreased activity tolerance, Decreased endurance, Decreased range of motion, Decreased strength, Hypomobility  Visit Diagnosis: Bilateral low back pain, unspecified chronicity, unspecified whether sciatica present  Other symptoms and signs involving the musculoskeletal system     Problem List Patient Active Problem List   Diagnosis Date Noted  . Colon adenomas   . Insomnia 01/11/2016  . Internal hemorrhoids with complication 75/09/2584  . Obstructive sleep apnea 07/27/2013  . Seasonal and perennial allergic rhinitis 07/27/2013  . Hx of colonic polyps 11/15/2012  . UNSPECIFIED DISEASE OF THE JAWS 03/11/2010  . COLONIC POLYPS, ADENOMATOUS 06/30/2009  . HYPERLIPIDEMIA 10/13/2008  . GLOBUS HYSTERICUS 10/13/2008  . GASTROESOPHAGEAL REFLUX  DISEASE 10/13/2008  . RENAL CALCULUS 10/13/2008  . Dysphagia, idiopathic 10/13/2008  . ABDOMINAL PAIN 10/13/2008  . H N P-LUMBAR 06/23/2008  . SPINAL STENOSIS 06/23/2008  . LOW BACK PAIN 06/23/2008   Clarene Critchley PT, DPT 12:53 PM, 03/02/20 [facility] [address] [place], [place], [address] Phone: [phone]   Fax:  [phone]  Name: Bruce Weber. MRN: [id number] Date of Birth: August 28, 1951

## 2020-03-03 ENCOUNTER — Other Ambulatory Visit: Payer: Self-pay | Admitting: Gastroenterology

## 2020-03-03 DIAGNOSIS — K219 Gastro-esophageal reflux disease without esophagitis: Secondary | ICD-10-CM

## 2020-03-05 ENCOUNTER — Other Ambulatory Visit: Payer: Self-pay

## 2020-03-05 ENCOUNTER — Encounter (HOSPITAL_COMMUNITY): Payer: Self-pay | Admitting: Physical Therapy

## 2020-03-05 ENCOUNTER — Ambulatory Visit (HOSPITAL_COMMUNITY): Payer: Medicare Other | Attending: Orthopedic Surgery | Admitting: Physical Therapy

## 2020-03-05 DIAGNOSIS — M545 Low back pain, unspecified: Secondary | ICD-10-CM

## 2020-03-05 DIAGNOSIS — R29898 Other symptoms and signs involving the musculoskeletal system: Secondary | ICD-10-CM | POA: Insufficient documentation

## 2020-03-05 NOTE — Therapy (Signed)
[place], [place], [address] Phone: [phone]   Fax:  [phone]  Physical Therapy Treatment  Patient Details  Name: Bruce Weber. MRN: [id number] Date of Birth: 09-05-1951 Referring Provider (PT): Arther Abbott, MD   Encounter Date: 03/05/2020  PT End of Session - 03/05/20 1128    Visit Number  12    Number of Visits  16    Date for PT Re-Evaluation  03/19/20    Authorization Type  Primary: Medicare; Secondary: AARP    Authorization Time Period  01/20/20 - 03/02/20 (Perform progress note on or by 10th visit)    Progress Note Due on Visit  20    PT Start Time  1124   Patient arrived late   PT Stop Time  1202    PT Time Calculation (min)  38 min    Activity Tolerance  Patient tolerated treatment well;No increased pain    Behavior During Therapy  WFL for tasks assessed/performed       Past Medical History:  Diagnosis Date  . Adenoma 11/2009   1.1 cm from TCS  . DM (diabetes mellitus) ([place])   . GERD (gastroesophageal reflux disease)   . High cholesterol   . HTN (hypertension)   . Kidney stones   . Obesity (BMI 30-39.9) DEC 2011 223 LBS  . Rectal bleeding 2011   secondary to hemorrhoids    Past Surgical History:  Procedure Laterality Date  . CIRCUMCISION    . COLONOSCOPY  Dec 2010 [facility]   MOD IH, SIMPLE ADENOMA 1.1 CM  . COLONOSCOPY  11/25/2012   SLF: 1. Sessile polyp measuring 35m in size was found at the hepatic flesure; polypectomy was performed using snare cautery 2. Moderate diverticulosis was noted in the ascending colon and sigmoid colon. 3. The colon mucosa was otherwise normal 4. Large internal hemorrhoids.   . COLONOSCOPY N/A 03/01/2017   Procedure: COLONOSCOPY;  Surgeon: SDanie Binder MD;  [facility];  Service: Endoscopy;  Laterality: N/A;  10:30 AM  . EYE SURGERY  05/2018   bilateral cataract surgery  . HERNIA REPAIR      There were no vitals filed for this  visit.  Subjective Assessment - 03/05/20 1128    Subjective  Patient reported having some back stiffness this date which he rated as a 2/10.    Limitations  Standing;Walking;Sitting    How long can you sit comfortably?  Tries to stand up every 30 minutes    How long can you stand comfortably?  A couple hours    How long can you walk comfortably?  Half an hour    Diagnostic tests  X-rays of lumbar and hips    Patient Stated Goals  Have less pain    Currently in Pain?  Yes    Pain Score  2     Pain Location  Back    Pain Orientation  Lower    Pain Descriptors / Indicators  Other (Comment)   Stiffness   Pain Onset  More than a month ago                       [facility] 0001      Lumbar Exercises: Stretches   Other Lumbar Stretch Exercise  Thoracic book openers 5x10'' holds in sidelying; seated thoracic rotation with dowel at chest and bolster between knees, 10''x10 reps each direction  Other Lumbar Stretch Exercise  Lower trunk rotation 10x10''      Lumbar Exercises: Standing   Functional Squats  10 reps    Functional Squats Limitations  Cues for form. BUE assist on // bars     Other Standing Lumbar Exercises  NBOS on foam BUE paloff press with 2# bar and then bilateral shoulder flexion, x15 reps each leg back    Other Standing Lumbar Exercises  Pallof press with rotate away GTB x15 reps each direction; chops x10 reps each direction; lifts x10 reps each direction GTB      Lumbar Exercises: Seated   Other Seated Lumbar Exercises  Seated thoracic extension over the back of the chair x10 cues for form      Lumbar Exercises: Quadruped   Other Quadruped Lumbar Exercises  Cat/cow 10x10'' holds each               PT Short Term Goals - 02/27/20 1123      PT SHORT TERM GOAL #1   Title  Patient will report understanding and regular compliance with HEP to improve mobility and decrease pain.    Time  3    Period  Weeks     Status  Achieved    Target Date  02/10/20        PT Long Term Goals - 02/27/20 1123      PT LONG TERM GOAL #1   Title  Patient will report that his back pain has not exceeded a 3/10 over the course of a 1 week period indicating improved tolerance to daily activities.    Baseline  02/27/20: Maximum of 4/10 pain over the last week    Time  3    Period  Weeks    Status  On-going    Target Date  03/19/20      PT LONG TERM GOAL #2   Title  Patient will demonstrate improvement on the FOTO assessment of at least 9% indicating improved percieved functional mobility.    Baseline  02/27/20: See objective measures    Time  3    Period  Weeks    Status  On-going    Target Date  03/19/20      PT LONG TERM GOAL #3   Title  Patient will report improvement in overall symptoms of at least 75% to improve functional mobility and quality of life.    Baseline  02/27/20: 50% improvement    Time  3    Period  Weeks    Status  Partially Met    Target Date  03/19/20      PT LONG TERM GOAL #4   Title  Patient will demonstrate pain-free lumbar AROM in order to improve functional mobility.    Baseline  02/27/20: See objective measures    Time  3    Period  Weeks    Status  Partially Met    Target Date  03/19/20            Plan - 03/05/20 1207    Clinical Impression Statement  Focused on mobility exercises this session. Patient required tactile and verbal cues to improve form with cat/cow exercise. This session added seated thoracic extension with cues to lengthen and lift up prior to performing back extension. Also added functional squats which patient required cueing for improved form with. Patient reported feeling good throughout session without increase in pain.    Personal Factors and Comorbidities  Age;Fitness;Comorbidity 2    Comorbidities  HTN, Kidney disease    Examination-Activity Limitations  Lift;Stand;Locomotion Level;Transfers;Squat;Stairs    Examination-Participation Restrictions   Yard Work;Cleaning;Meal Prep;Community Activity;Shop    Stability/Clinical Decision Making  Stable/Uncomplicated    Rehab Potential  Good    PT Frequency  2x / week    PT Duration  3 weeks    PT Treatment/Interventions  ADLs/Self Care Home Management;Aquatic Therapy;Cryotherapy;Electrical Stimulation;Moist Heat;Traction;DME Instruction;Gait training;Stair training;Functional mobility training;Therapeutic activities;Therapeutic exercise;Balance training;Neuromuscular re-education;Patient/family education;Orthotic Fit/Training;Manual techniques;Passive range of motion;Dry needling;Energy conservation;Taping    PT Next Visit Plan  Continue thoracic and lumbar mobility exercises. Manual to decrease pain and improve mobility PRN    PT Home Exercise Plan  01/20/20: LTR 10x10''; 2/23: Seated posture/lumbar roll, SKTC, bridge; 02/06/20: Deep breathing for rib mobility; 02/13/20: Postural strengthening GTB rows and pallof press; 3/23: posterior pelvic tilt    Consulted and Agree with Plan of Care  Patient       Patient will benefit from skilled therapeutic intervention in order to improve the following deficits and impairments:  Improper body mechanics, Pain, Decreased mobility, Postural dysfunction, Decreased activity tolerance, Decreased endurance, Decreased range of motion, Decreased strength, Hypomobility  Visit Diagnosis: Bilateral low back pain, unspecified chronicity, unspecified whether sciatica present  Other symptoms and signs involving the musculoskeletal system     Problem List Patient Active Problem List   Diagnosis Date Noted  . Colon adenomas   . Insomnia 01/11/2016  . Internal hemorrhoids with complication 24/19/9144  . Obstructive sleep apnea 07/27/2013  . Seasonal and perennial allergic rhinitis 07/27/2013  . Hx of colonic polyps 11/15/2012  . UNSPECIFIED DISEASE OF THE JAWS 03/11/2010  . COLONIC POLYPS, ADENOMATOUS 06/30/2009  . HYPERLIPIDEMIA 10/13/2008  . GLOBUS HYSTERICUS  10/13/2008  . GASTROESOPHAGEAL REFLUX DISEASE 10/13/2008  . RENAL CALCULUS 10/13/2008  . Dysphagia, idiopathic 10/13/2008  . ABDOMINAL PAIN 10/13/2008  . H N P-LUMBAR 06/23/2008  . SPINAL STENOSIS 06/23/2008  . LOW BACK PAIN 06/23/2008   Clarene Critchley PT, DPT 12:08 PM, 03/05/20 [facility] [address] [place], [place], [address] Phone: [phone]   Fax:  [phone]  Name: Joss Mcdill. MRN: [id number] Date of Birth: [birth year]

## 2020-03-06 ENCOUNTER — Other Ambulatory Visit: Payer: Self-pay | Admitting: Family Medicine

## 2020-03-07 ENCOUNTER — Other Ambulatory Visit: Payer: Self-pay | Admitting: Family Medicine

## 2020-03-07 DIAGNOSIS — I10 Essential (primary) hypertension: Secondary | ICD-10-CM

## 2020-03-08 ENCOUNTER — Other Ambulatory Visit: Payer: Self-pay

## 2020-03-08 NOTE — Telephone Encounter (Signed)
Please advise 

## 2020-03-10 ENCOUNTER — Other Ambulatory Visit: Payer: Self-pay

## 2020-03-10 ENCOUNTER — Encounter (HOSPITAL_COMMUNITY): Payer: Self-pay | Admitting: Physical Therapy

## 2020-03-10 ENCOUNTER — Ambulatory Visit (HOSPITAL_COMMUNITY): Payer: Medicare Other | Admitting: Physical Therapy

## 2020-03-10 DIAGNOSIS — M545 Low back pain, unspecified: Secondary | ICD-10-CM

## 2020-03-10 DIAGNOSIS — R29898 Other symptoms and signs involving the musculoskeletal system: Secondary | ICD-10-CM | POA: Diagnosis not present

## 2020-03-10 NOTE — Therapy (Signed)
[place], [place], [address] Phone: [phone]   Fax:  [phone]  Physical Therapy Treatment  Patient Details  Name: Bruce Weber. MRN: [id number] Date of Birth: 08-02-1951 Referring Provider (PT): Arther Abbott, MD   Encounter Date: 03/10/2020  PT End of Session - 03/10/20 0952    Visit Number  13    Number of Visits  16    Date for PT Re-Evaluation  03/19/20    Authorization Type  Primary: Medicare; Secondary: AARP    Authorization Time Period  01/20/20 - 03/02/20 (Perform progress note on or by 10th visit)    Progress Note Due on Visit  20    PT Start Time  0947    PT Stop Time  1025    PT Time Calculation (min)  38 min    Activity Tolerance  Patient tolerated treatment well;No increased pain    Behavior During Therapy  WFL for tasks assessed/performed       Past Medical History:  Diagnosis Date  . Adenoma 11/2009   1.1 cm from TCS  . DM (diabetes mellitus) ([place])   . GERD (gastroesophageal reflux disease)   . High cholesterol   . HTN (hypertension)   . Kidney stones   . Obesity (BMI 30-39.9) DEC 2011 223 LBS  . Rectal bleeding 2011   secondary to hemorrhoids    Past Surgical History:  Procedure Laterality Date  . CIRCUMCISION    . COLONOSCOPY  Dec 2010 [facility]   MOD IH, SIMPLE ADENOMA 1.1 CM  . COLONOSCOPY  11/25/2012   SLF: 1. Sessile polyp measuring 4m in size was found at the hepatic flesure; polypectomy was performed using snare cautery 2. Moderate diverticulosis was noted in the ascending colon and sigmoid colon. 3. The colon mucosa was otherwise normal 4. Large internal hemorrhoids.   . COLONOSCOPY N/A 03/01/2017   Procedure: COLONOSCOPY;  Surgeon: SDanie Binder MD;  [facility];  Service: Endoscopy;  Laterality: N/A;  10:30 AM  . EYE SURGERY  05/2018   bilateral cataract surgery  . HERNIA REPAIR      There were no vitals filed for this visit.  Subjective  Assessment - 03/10/20 0950    Subjective  Patient reported that he feels like he may have some gout in his right big toe.    Limitations  Standing;Walking;Sitting    How long can you sit comfortably?  Tries to stand up every 30 minutes    How long can you stand comfortably?  A couple hours    How long can you walk comfortably?  Half an hour    Diagnostic tests  X-rays of lumbar and hips    Patient Stated Goals  Have less pain    Pain Onset  More than a month ago         [facility] 03/10/20 0001      Observation/Other Assessments   Observations  No noted redness or swelling on the foot or toes of right foot                   [facility] Adult PT Treatment/Exercise - 03/10/20 0001      Lumbar Exercises: Standing   Other Standing Lumbar Exercises  NBOS on foam BUE paloff press with 3# bar and then bilateral shoulder flexion, x15 reps. Y liftoff with thoracic extension to improve thoracic mobility and postural strength x 15    Other Standing  Lumbar Exercises  Pallof press with rotate away GTB x15 reps each direction feet semi-tandem position      Lumbar Exercises: Seated   Other Seated Lumbar Exercises  Seated on medium physioball marching 3'' x20 alternating sides with upper extremity assistance. Cues for upright sitting               PT Short Term Goals - 02/27/20 1123      PT SHORT TERM GOAL #1   Title  Patient will report understanding and regular compliance with HEP to improve mobility and decrease pain.    Time  3    Period  Weeks    Status  Achieved    Target Date  02/10/20        PT Long Term Goals - 02/27/20 1123      PT LONG TERM GOAL #1   Title  Patient will report that his back pain has not exceeded a 3/10 over the course of a 1 week period indicating improved tolerance to daily activities.    Baseline  02/27/20: Maximum of 4/10 pain over the last week    Time  3    Period  Weeks    Status  On-going    Target Date  03/19/20      PT  LONG TERM GOAL #2   Title  Patient will demonstrate improvement on the FOTO assessment of at least 9% indicating improved percieved functional mobility.    Baseline  02/27/20: See objective measures    Time  3    Period  Weeks    Status  On-going    Target Date  03/19/20      PT LONG TERM GOAL #3   Title  Patient will report improvement in overall symptoms of at least 75% to improve functional mobility and quality of life.    Baseline  02/27/20: 50% improvement    Time  3    Period  Weeks    Status  Partially Met    Target Date  03/19/20      PT LONG TERM GOAL #4   Title  Patient will demonstrate pain-free lumbar AROM in order to improve functional mobility.    Baseline  02/27/20: See objective measures    Time  3    Period  Weeks    Status  Partially Met    Target Date  03/19/20            Plan - 03/10/20 1053    Clinical Impression Statement  This session continued to focus on thoracic mobility as well as improving rotational stability and mobility. Added seated on physioball marching with abdominal set for contralateral activation and mobility, which patient required upper extremity assistance to maintain balance for. Also added standing "Y lift off" with thoracic extension for improved mobility and postural strength to maintain upright posture. Patient did report some right toe pain, upon examination, no noted swelling or redness in the area. Educated patient on following up with MD regarding this.    Personal Factors and Comorbidities  Age;Fitness;Comorbidity 2    Comorbidities  HTN, Kidney disease    Examination-Activity Limitations  Lift;Stand;Locomotion Level;Transfers;Squat;Stairs    Examination-Participation Restrictions  Yard Work;Cleaning;Meal Prep;Community Activity;Shop    Stability/Clinical Decision Making  Stable/Uncomplicated    Rehab Potential  Good    PT Frequency  2x / week    PT Duration  3 weeks    PT Treatment/Interventions  ADLs/Self Care Home  Management;Aquatic Therapy;Cryotherapy;Electrical Stimulation;Moist Heat;Traction;DME Instruction;Gait training;Stair  training;Functional mobility training;Therapeutic activities;Therapeutic exercise;Balance training;Neuromuscular re-education;Patient/family education;Orthotic Fit/Training;Manual techniques;Passive range of motion;Dry needling;Energy conservation;Taping    PT Next Visit Plan  Continue thoracic and lumbar mobility exercises. Manual to decrease pain and improve mobility PRN    PT Home Exercise Plan  01/20/20: LTR 10x10''; 2/23: Seated posture/lumbar roll, SKTC, bridge; 02/06/20: Deep breathing for rib mobility; 02/13/20: Postural strengthening GTB rows and pallof press; 3/23: posterior pelvic tilt    Consulted and Agree with Plan of Care  Patient       Patient will benefit from skilled therapeutic intervention in order to improve the following deficits and impairments:  Improper body mechanics, Pain, Decreased mobility, Postural dysfunction, Decreased activity tolerance, Decreased endurance, Decreased range of motion, Decreased strength, Hypomobility  Visit Diagnosis: Bilateral low back pain, unspecified chronicity, unspecified whether sciatica present  Other symptoms and signs involving the musculoskeletal system     Problem List Patient Active Problem List   Diagnosis Date Noted  . Colon adenomas   . Insomnia 01/11/2016  . Internal hemorrhoids with complication 40/39/7953  . Obstructive sleep apnea 07/27/2013  . Seasonal and perennial allergic rhinitis 07/27/2013  . Hx of colonic polyps 11/15/2012  . UNSPECIFIED DISEASE OF THE JAWS 03/11/2010  . COLONIC POLYPS, ADENOMATOUS 06/30/2009  . HYPERLIPIDEMIA 10/13/2008  . GLOBUS HYSTERICUS 10/13/2008  . GASTROESOPHAGEAL REFLUX DISEASE 10/13/2008  . RENAL CALCULUS 10/13/2008  . Dysphagia, idiopathic 10/13/2008  . ABDOMINAL PAIN 10/13/2008  . H N P-LUMBAR 06/23/2008  . SPINAL STENOSIS 06/23/2008  . LOW BACK PAIN 06/23/2008    Clarene Critchley PT, DPT 10:55 AM, 03/10/20 [place], [place], [address] Phone: [phone]   Fax:  [phone]  Name: Bruce Weber. MRN: [id number] Date of Birth: February 28, 1951

## 2020-03-12 ENCOUNTER — Encounter (HOSPITAL_COMMUNITY): Payer: Self-pay | Admitting: Physical Therapy

## 2020-03-12 ENCOUNTER — Ambulatory Visit (HOSPITAL_COMMUNITY): Payer: Medicare Other | Admitting: Physical Therapy

## 2020-03-12 ENCOUNTER — Other Ambulatory Visit: Payer: Self-pay

## 2020-03-12 DIAGNOSIS — R29898 Other symptoms and signs involving the musculoskeletal system: Secondary | ICD-10-CM

## 2020-03-12 DIAGNOSIS — M545 Low back pain, unspecified: Secondary | ICD-10-CM

## 2020-03-12 NOTE — Therapy (Signed)
[facility], [place], [address] Phone: [phone]   Fax:  [phone]  Physical Therapy Treatment  Patient Details  Name: Bruce Weber. MRN: [id number] Date of Birth: [birth year] Referring Provider (PT): Arther Abbott, MD   Encounter Date: 03/12/2020  PT End of Session - 03/12/20 1014    Visit Number  14    Number of Visits  16    Date for PT Re-Evaluation  03/19/20    Authorization Type  Primary: Medicare; Secondary: AARP    Authorization Time Period  01/20/20 - 03/02/20 (Perform progress note on or by 10th visit)    Progress Note Due on Visit  20    PT Start Time  0947    PT Stop Time  1026    PT Time Calculation (min)  39 min    Activity Tolerance  Patient tolerated treatment well;No increased pain    Behavior During Therapy  WFL for tasks assessed/performed       Past Medical History:  Diagnosis Date  . Adenoma 11/2009   1.1 cm from TCS  . DM (diabetes mellitus) ([place])   . GERD (gastroesophageal reflux disease)   . High cholesterol   . HTN (hypertension)   . Kidney stones   . Obesity (BMI 30-39.9) DEC 2011 223 LBS  . Rectal bleeding 2011   secondary to hemorrhoids    Past Surgical History:  Procedure Laterality Date  . CIRCUMCISION    . COLONOSCOPY  Dec 2010 [facility]   MOD IH, SIMPLE ADENOMA 1.1 CM  . COLONOSCOPY  11/25/2012   SLF: 1. Sessile polyp measuring 45m in size was found at the hepatic flesure; polypectomy was performed using snare cautery 2. Moderate diverticulosis was noted in the ascending colon and sigmoid colon. 3. The colon mucosa was otherwise normal 4. Large internal hemorrhoids.   . COLONOSCOPY N/A 03/01/2017   Procedure: COLONOSCOPY;  Surgeon: SDanie Binder MD;  [facility];  Service: Endoscopy;  Laterality: N/A;  10:30 AM  . EYE SURGERY  05/2018   bilateral cataract surgery  . HERNIA REPAIR      There were no vitals filed for this visit.  Subjective  Assessment - 03/12/20 0950    Subjective  Patient reported that his toe has been feeling better. Patient reported that his back pain is a 3/10 currently.    Limitations  Standing;Walking;Sitting    How long can you sit comfortably?  Tries to stand up every 30 minutes    How long can you stand comfortably?  A couple hours    How long can you walk comfortably?  Half an hour    Diagnostic tests  X-rays of lumbar and hips    Patient Stated Goals  Have less pain    Currently in Pain?  Yes    Pain Score  3     Pain Location  Back    Pain Orientation  Lower    Pain Descriptors / Indicators  Other (Comment)   Stiffness   Pain Onset  More than a month ago                       [facility] - 03/12/20 0001      Lumbar Exercises: Stretches   Other Lumbar Stretch Exercise  Thoracic book openers 10x10'' holds in sidelying      Lumbar Exercises: Standing   Other Standing Lumbar Exercises  NBOS on  foam BUE paloff press with 3# bar and then bilateral shoulder flexion, x15 reps. Y liftoff with thoracic extension to improve thoracic mobility and postural strength x 15    Other Standing Lumbar Exercises  Pallof press with rotate away GTB x15 reps each direction feet tandem position      Lumbar Exercises: Seated   Other Seated Lumbar Exercises  Seated on medium physioball marching 3'' x20 alternating sides with upper extremity assistance. Cues for upright sitting      Manual Therapy   Manual Therapy  Manual Traction    Manual therapy comments  All manual completed separately from other skilled interventions    Manual Traction  Patient supine using sheet around patient's lower back therapist sitting back for traction force to patient's tolerance 8 minutes total               PT Short Term Goals - 02/27/20 1123      PT SHORT TERM GOAL #1   Title  Patient will report understanding and regular compliance with HEP to improve mobility and decrease pain.     Time  3    Period  Weeks    Status  Achieved    Target Date  02/10/20        PT Long Term Goals - 02/27/20 1123      PT LONG TERM GOAL #1   Title  Patient will report that his back pain has not exceeded a 3/10 over the course of a 1 week period indicating improved tolerance to daily activities.    Baseline  02/27/20: Maximum of 4/10 pain over the last week    Time  3    Period  Weeks    Status  On-going    Target Date  03/19/20      PT LONG TERM GOAL #2   Title  Patient will demonstrate improvement on the FOTO assessment of at least 9% indicating improved percieved functional mobility.    Baseline  02/27/20: See objective measures    Time  3    Period  Weeks    Status  On-going    Target Date  03/19/20      PT LONG TERM GOAL #3   Title  Patient will report improvement in overall symptoms of at least 75% to improve functional mobility and quality of life.    Baseline  02/27/20: 50% improvement    Time  3    Period  Weeks    Status  Partially Met    Target Date  03/19/20      PT LONG TERM GOAL #4   Title  Patient will demonstrate pain-free lumbar AROM in order to improve functional mobility.    Baseline  02/27/20: See objective measures    Time  3    Period  Weeks    Status  Partially Met    Target Date  03/19/20            Plan - 03/12/20 1040    Clinical Impression Statement  This session added manual traction with a sheet to reduce some of patient's pain and stiffness. Performed for 8 minutes with static holds for 45 seconds to 1 minute in length. Patient reported an improvement in symptoms following this. Patient continued to require cueing for proper form of exercises, particularly with pallof press and with Y liftoff. Plan to follow-up regarding effects of manual traction at the next session and continue if positive.    Personal Factors and Comorbidities  Age;Fitness;Comorbidity 2    Comorbidities  HTN, Kidney disease    Examination-Activity Limitations   Lift;Stand;Locomotion Level;Transfers;Squat;Stairs    Examination-Participation Restrictions  Yard Work;Cleaning;Meal Prep;Community Activity;Shop    Stability/Clinical Decision Making  Stable/Uncomplicated    Rehab Potential  Good    PT Frequency  2x / week    PT Duration  3 weeks    PT Treatment/Interventions  ADLs/Self Care Home Management;Aquatic Therapy;Cryotherapy;Electrical Stimulation;Moist Heat;Traction;DME Instruction;Gait training;Stair training;Functional mobility training;Therapeutic activities;Therapeutic exercise;Balance training;Neuromuscular re-education;Patient/family education;Orthotic Fit/Training;Manual techniques;Passive range of motion;Dry needling;Energy conservation;Taping    PT Next Visit Plan  Continue thoracic and lumbar mobility exercises. F/U regarding manual traction and continue if a positive benefit    PT Home Exercise Plan  01/20/20: LTR 10x10''; 2/23: Seated posture/lumbar roll, SKTC, bridge; 02/06/20: Deep breathing for rib mobility; 02/13/20: Postural strengthening GTB rows and pallof press; 3/23: posterior pelvic tilt    Consulted and Agree with Plan of Care  Patient       Patient will benefit from skilled therapeutic intervention in order to improve the following deficits and impairments:  Improper body mechanics, Pain, Decreased mobility, Postural dysfunction, Decreased activity tolerance, Decreased endurance, Decreased range of motion, Decreased strength, Hypomobility  Visit Diagnosis: Bilateral low back pain, unspecified chronicity, unspecified whether sciatica present  Other symptoms and signs involving the musculoskeletal system     Problem List Patient Active Problem List   Diagnosis Date Noted  . Colon adenomas   . Insomnia 01/11/2016  . Internal hemorrhoids with complication 28/20/8138  . Obstructive sleep apnea 07/27/2013  . Seasonal and perennial allergic rhinitis 07/27/2013  . Hx of colonic polyps 11/15/2012  . UNSPECIFIED DISEASE OF THE  JAWS 03/11/2010  . COLONIC POLYPS, ADENOMATOUS 06/30/2009  . HYPERLIPIDEMIA 10/13/2008  . GLOBUS HYSTERICUS 10/13/2008  . GASTROESOPHAGEAL REFLUX DISEASE 10/13/2008  . RENAL CALCULUS 10/13/2008  . Dysphagia, idiopathic 10/13/2008  . ABDOMINAL PAIN 10/13/2008  . H N P-LUMBAR 06/23/2008  . SPINAL STENOSIS 06/23/2008  . LOW BACK PAIN 06/23/2008   Clarene Critchley PT, DPT 10:42 AM, 03/12/20 [place], [place], [address] Phone: [phone]   Fax:  [phone]  Name: Kenn Rekowski. MRN: [id number] Date of Birth: 1950-12-07

## 2020-03-17 ENCOUNTER — Other Ambulatory Visit: Payer: Self-pay

## 2020-03-17 ENCOUNTER — Ambulatory Visit (INDEPENDENT_AMBULATORY_CARE_PROVIDER_SITE_OTHER): Payer: Medicare Other | Admitting: Orthopedic Surgery

## 2020-03-17 ENCOUNTER — Encounter: Payer: Self-pay | Admitting: Orthopedic Surgery

## 2020-03-17 ENCOUNTER — Ambulatory Visit (HOSPITAL_COMMUNITY): Payer: Medicare Other | Admitting: Physical Therapy

## 2020-03-17 ENCOUNTER — Encounter (HOSPITAL_COMMUNITY): Payer: Self-pay | Admitting: Physical Therapy

## 2020-03-17 VITALS — BP 131/72 | HR 66 | Ht 70.0 in | Wt 204.0 lb

## 2020-03-17 DIAGNOSIS — M545 Low back pain, unspecified: Secondary | ICD-10-CM

## 2020-03-17 DIAGNOSIS — R29898 Other symptoms and signs involving the musculoskeletal system: Secondary | ICD-10-CM | POA: Diagnosis not present

## 2020-03-17 NOTE — Progress Notes (Signed)
Chief Complaint  Patient presents with  . Back Pain    better with therapy     This is a follow-up evaluation Previous history: 69 year old male presents for evaluation of lower back pain which started on one side and went across his back and is settled shoulders and both sides of the back and radiates to both hips  He does not have any groin pain  He has not have any radicular symptoms  He is only tried Tylenol.  His symptoms started a few months ago he did have x-rays of his hips and his back and he has mild arthritis in his hip and degenerative arthritis in his lower back   Bruce Weber tells [place] he is doing much better his pain is improved his exercises are going well  His hip exam was normal today he was walking well I released him to home exercises  Encounter Diagnosis  Name Primary?  . Lumbar pain Yes

## 2020-03-17 NOTE — Patient Instructions (Signed)
Home exercises continue

## 2020-03-17 NOTE — Therapy (Signed)
[facility] [place], [place], [address] Phone: [phone]   Fax:  [phone]  Physical Therapy Treatment  Patient Details  Name: Bruce Weber. MRN: [id number] Date of Birth: 1951-04-22 Referring Provider (PT): Arther Abbott, MD   Encounter Date: 03/17/2020  PT End of Session - 03/17/20 0951    Visit Number  15    Number of Visits  16    Date for PT Re-Evaluation  03/19/20    Authorization Type  Primary: Medicare; Secondary: AARP    Authorization Time Period  01/20/20 - 03/02/20 (Perform progress note on or by 10th visit)    Progress Note Due on Visit  20    PT Start Time  0947    PT Stop Time  1025    PT Time Calculation (min)  38 min    Activity Tolerance  Patient tolerated treatment well;No increased pain    Behavior During Therapy  WFL for tasks assessed/performed       Past Medical History:  Diagnosis Date  . Adenoma 11/2009   1.1 cm from TCS  . DM (diabetes mellitus) ([place])   . GERD (gastroesophageal reflux disease)   . High cholesterol   . HTN (hypertension)   . Kidney stones   . Obesity (BMI 30-39.9) DEC 2011 223 LBS  . Rectal bleeding 2011   secondary to hemorrhoids    Past Surgical History:  Procedure Laterality Date  . CIRCUMCISION    . COLONOSCOPY  Dec 2010 [facility]   MOD IH, SIMPLE ADENOMA 1.1 CM  . COLONOSCOPY  11/25/2012   SLF: 1. Sessile polyp measuring 32m in size was found at the hepatic flesure; polypectomy was performed using snare cautery 2. Moderate diverticulosis was noted in the ascending colon and sigmoid colon. 3. The colon mucosa was otherwise normal 4. Large internal hemorrhoids.   . COLONOSCOPY N/A 03/01/2017   Procedure: COLONOSCOPY;  Surgeon: SDanie Binder MD;  [facility];  Service: Endoscopy;  Laterality: N/A;  10:30 AM  . EYE SURGERY  05/2018   bilateral cataract surgery  . HERNIA REPAIR      There were no vitals filed for this visit.  Subjective  Assessment - 03/17/20 0950    Subjective  Patient reported feeling that the manual traction helped his back last session.    Limitations  Standing;Walking;Sitting    How long can you sit comfortably?  Tries to stand up every 30 minutes    How long can you stand comfortably?  A couple hours    How long can you walk comfortably?  Half an hour    Diagnostic tests  X-rays of lumbar and hips    Patient Stated Goals  Have less pain    Currently in Pain?  Yes    Pain Score  3     Pain Location  Back    Pain Orientation  Lower    Pain Descriptors / Indicators  Other (Comment)   Stiffness   Pain Type  Chronic pain    Pain Onset  More than a month ago                       [facility] 0001      Lumbar Exercises: Stretches   Other Lumbar Stretch Exercise  Thoracic book openers 10x10'' holds in sidelying      Lumbar Exercises: Standing   Other Standing Lumbar Exercises  NBOS  on foam BUE paloff press with 3# bar and then bilateral shoulder flexion, x15 reps. Y liftoff with thoracic extension to improve thoracic mobility and postural strength x 15      Lumbar Exercises: Seated   Other Seated Lumbar Exercises  Seated on medium physioball marching 3'' x20 alternating sides with upper extremity assistance. Cues for upright sitting      Manual Therapy   Manual Therapy  Manual Traction    Manual therapy comments  All manual completed separately from other skilled interventions    Manual Traction  Patient supine using sheet around patient's lower back at L4-5 therapist sitting back for traction force to patient's tolerance 10 minutes total               PT Short Term Goals - 02/27/20 1123      PT SHORT TERM GOAL #1   Title  Patient will report understanding and regular compliance with HEP to improve mobility and decrease pain.    Time  3    Period  Weeks    Status  Achieved    Target Date  02/10/20        PT Long Term Goals -  02/27/20 1123      PT LONG TERM GOAL #1   Title  Patient will report that his back pain has not exceeded a 3/10 over the course of a 1 week period indicating improved tolerance to daily activities.    Baseline  02/27/20: Maximum of 4/10 pain over the last week    Time  3    Period  Weeks    Status  On-going    Target Date  03/19/20      PT LONG TERM GOAL #2   Title  Patient will demonstrate improvement on the FOTO assessment of at least 9% indicating improved percieved functional mobility.    Baseline  02/27/20: See objective measures    Time  3    Period  Weeks    Status  On-going    Target Date  03/19/20      PT LONG TERM GOAL #3   Title  Patient will report improvement in overall symptoms of at least 75% to improve functional mobility and quality of life.    Baseline  02/27/20: 50% improvement    Time  3    Period  Weeks    Status  Partially Met    Target Date  03/19/20      PT LONG TERM GOAL #4   Title  Patient will demonstrate pain-free lumbar AROM in order to improve functional mobility.    Baseline  02/27/20: See objective measures    Time  3    Period  Weeks    Status  Partially Met    Target Date  03/19/20            Plan - 03/17/20 1055    Clinical Impression Statement  Patient reported a positive response to manual traction, therefore continued with it this session for an increased amount of time. Patient continued to require cueing with seated physioball exercises for upright sitting and continued to require upper extremity assistance to maintain balance. Plan to re-assess patient next session.    Personal Factors and Comorbidities  Age;Fitness;Comorbidity 2    Comorbidities  HTN, Kidney disease    Examination-Activity Limitations  Lift;Stand;Locomotion Level;Transfers;Squat;Stairs    Examination-Participation Restrictions  Yard Work;Cleaning;Meal Prep;Community Activity;Shop    Stability/Clinical Decision Making  Stable/Uncomplicated    Rehab Potential  Good     PT Frequency  2x / week    PT Duration  3 weeks    PT Treatment/Interventions  ADLs/Self Care Home Management;Aquatic Therapy;Cryotherapy;Electrical Stimulation;Moist Heat;Traction;DME Instruction;Gait training;Stair training;Functional mobility training;Therapeutic activities;Therapeutic exercise;Balance training;Neuromuscular re-education;Patient/family education;Orthotic Fit/Training;Manual techniques;Passive range of motion;Dry needling;Energy conservation;Taping    PT Next Visit Plan  Re-assess    PT Home Exercise Plan  01/20/20: LTR 10x10''; 2/23: Seated posture/lumbar roll, SKTC, bridge; 02/06/20: Deep breathing for rib mobility; 02/13/20: Postural strengthening GTB rows and pallof press; 3/23: posterior pelvic tilt    Consulted and Agree with Plan of Care  Patient       Patient will benefit from skilled therapeutic intervention in order to improve the following deficits and impairments:  Improper body mechanics, Pain, Decreased mobility, Postural dysfunction, Decreased activity tolerance, Decreased endurance, Decreased range of motion, Decreased strength, Hypomobility  Visit Diagnosis: Bilateral low back pain, unspecified chronicity, unspecified whether sciatica present  Other symptoms and signs involving the musculoskeletal system     Problem List Patient Active Problem List   Diagnosis Date Noted  . Colon adenomas   . Insomnia 01/11/2016  . Internal hemorrhoids with complication 58/44/1712  . Obstructive sleep apnea 07/27/2013  . Seasonal and perennial allergic rhinitis 07/27/2013  . Hx of colonic polyps 11/15/2012  . UNSPECIFIED DISEASE OF THE JAWS 03/11/2010  . COLONIC POLYPS, ADENOMATOUS 06/30/2009  . HYPERLIPIDEMIA 10/13/2008  . GLOBUS HYSTERICUS 10/13/2008  . GASTROESOPHAGEAL REFLUX DISEASE 10/13/2008  . RENAL CALCULUS 10/13/2008  . Dysphagia, idiopathic 10/13/2008  . ABDOMINAL PAIN 10/13/2008  . H N P-LUMBAR 06/23/2008  . SPINAL STENOSIS 06/23/2008  . LOW BACK  PAIN 06/23/2008   Clarene Critchley PT, DPT 10:56 AM, 03/17/20 [place] [address] [place], [place], [address] Phone: [phone]   Fax:  [phone]  Name: Bruce Weber. MRN: [id number] Date of Birth: 1950-12-31

## 2020-03-19 ENCOUNTER — Ambulatory Visit (HOSPITAL_COMMUNITY): Payer: Medicare Other | Admitting: Physical Therapy

## 2020-03-19 ENCOUNTER — Encounter (HOSPITAL_COMMUNITY): Payer: Self-pay | Admitting: Physical Therapy

## 2020-03-19 ENCOUNTER — Other Ambulatory Visit: Payer: Self-pay

## 2020-03-19 DIAGNOSIS — M545 Low back pain, unspecified: Secondary | ICD-10-CM

## 2020-03-19 DIAGNOSIS — R29898 Other symptoms and signs involving the musculoskeletal system: Secondary | ICD-10-CM

## 2020-03-19 NOTE — Therapy (Signed)
[facility] [place], [place], [address] Phone: [phone]   Fax:  [phone]  Physical Therapy Treatment  Patient Details  Name: Bruce Weber. MRN: [id number] Date of Birth: 24-Aug-1951 Referring Provider (PT): Arther Abbott, MD   Encounter Date: 03/19/2020   Progress Note Reporting Period 02/27/20 to 04/16/20  See note below for Objective Data and Assessment of Progress/Goals.       PT End of Session - 03/19/20 1249    Visit Number  16    Number of Visits  17    Date for PT Re-Evaluation  04/16/20    Authorization Type  Primary: Medicare; Secondary: AARP    Progress Note Due on Visit  20    PT Start Time  1115    PT Stop Time  1155    PT Time Calculation (min)  40 min    Activity Tolerance  Patient tolerated treatment well;No increased pain    Behavior During Therapy  WFL for tasks assessed/performed       Past Medical History:  Diagnosis Date  . Adenoma 11/2009   1.1 cm from TCS  . DM (diabetes mellitus) ([place])   . GERD (gastroesophageal reflux disease)   . High cholesterol   . HTN (hypertension)   . Kidney stones   . Obesity (BMI 30-39.9) DEC 2011 223 LBS  . Rectal bleeding 2011   secondary to hemorrhoids    Past Surgical History:  Procedure Laterality Date  . CIRCUMCISION    . COLONOSCOPY  Dec 2010 [facility]   MOD IH, SIMPLE ADENOMA 1.1 CM  . COLONOSCOPY  11/25/2012   SLF: 1. Sessile polyp measuring 35m in size was found at the hepatic flesure; polypectomy was performed using snare cautery 2. Moderate diverticulosis was noted in the ascending colon and sigmoid colon. 3. The colon mucosa was otherwise normal 4. Large internal hemorrhoids.   . COLONOSCOPY N/A 03/01/2017   Procedure: COLONOSCOPY;  Surgeon: SDanie Binder MD;  [facility];  Service: Endoscopy;  Laterality: N/A;  10:30 AM  . EYE SURGERY  05/2018   bilateral cataract surgery  . HERNIA REPAIR      There were no vitals  filed for this visit.  Subjective Assessment - 03/19/20 1122    Subjective  Patient reported right hip pain which he rated is a 4/10 currently. He stated he felt great following the last session, but that his hip started hurting the last couple days which he suspects may have been due to the way he slept. Patient reported that prior to this set-back he feels like he has improved by at least 50% since beginning therpay.    Limitations  Standing;Walking;Sitting    How long can you sit comfortably?  Tries to stand up every 30 minutes    How long can you stand comfortably?  A couple hours    How long can you walk comfortably?  Half an hour    Diagnostic tests  X-rays of lumbar and hips    Patient Stated Goals  Have less pain    Currently in Pain?  Yes    Pain Score  4     Pain Location  Hip    Pain Orientation  Right    Pain Descriptors / Indicators  Other (Comment)   Stiffness   Pain Onset  In the past 7 days         [facility] - 03/19/20 0001  Observation/Other Assessments   Focus on Therapeutic Outcomes (FOTO)   56%   was 50%, then 57%, then 53%     ROM / Strength   AROM / PROM / Strength  --   Min limited IR of right hip pain. ER WFL. Pain scours R     AROM   Lumbar Flexion  10% limited    Lumbar Extension  75% limited   was 75% limited   Lumbar - Right Side Bend  25% limited   was 25% some back pain   Lumbar - Left Side Bend  25% limited. Slight pain   was 25%   Lumbar - Right Rotation  25% limited pain   was 25% limited. Painful   Lumbar - Left Rotation  25% limited. Painful   was 25% limited     Palpation   Spinal mobility  With CPAs noted general hypomobility throughout spine with increased pain through lumbar spine                   [facility] Adult PT Treatment/Exercise - 03/19/20 0001      Manual Therapy   Manual Therapy  Manual Traction;Soft tissue mobilization;Joint mobilization    Manual therapy comments  All manual completed  separately from other skilled interventions    Joint Mobilization  To lower thoracic through lumbar spine CPAs and to transverse processes grade II    Soft tissue mobilization  To lumbar paraspinals and into gluteals on the RT    Manual Traction  long axis distraction to the right hip 4 reps 20-40 seconds grade III for pain relief             PT Education - 03/19/20 1247    Education Details  Discussed re-assessment and plan for follow-up visit in next several weeks.    Person(s) Educated  Patient    Methods  Explanation    Comprehension  Verbalized understanding       PT Short Term Goals - 02/27/20 1123      PT SHORT TERM GOAL #1   Title  Patient will report understanding and regular compliance with HEP to improve mobility and decrease pain.    Time  3    Period  Weeks    Status  Achieved    Target Date  02/10/20        PT Long Term Goals - 03/19/20 1126      PT LONG TERM GOAL #1   Title  Patient will report that his back pain has not exceeded a 3/10 over the course of a 1 week period indicating improved tolerance to daily activities.    Baseline  03/19/20: 4/10 currently    Time  4    Period  Weeks    Status  On-going    Target Date  04/16/20      PT LONG TERM GOAL #2   Title  Patient will demonstrate improvement on the FOTO assessment of at least 9% indicating improved percieved functional mobility.    Baseline  03/19/20: See objective measures    Time  4    Period  Weeks    Status  On-going    Target Date  04/16/20      PT LONG TERM GOAL #3   Title  Patient will report improvement in overall symptoms of at least 75% to improve functional mobility and quality of life.    Baseline  03/19/20: 50% improvement prior to recent set-back    Time  4    Period  Weeks    Status  Partially Met    Target Date  04/16/20      PT LONG TERM GOAL #4   Title  Patient will demonstrate pain-free lumbar AROM in order to improve functional mobility.    Baseline  03/19/20: See  objective measures    Time  3    Period  Weeks    Status  Partially Met    Target Date  04/16/20            Plan - 03/19/20 1408    Clinical Impression Statement  Patient reported that he thinks he slept wrong and reported an increase in his pain particularly through his right hip. Performed re-assessment of patient's progress towards goals however, patient reported that he felt he was most limited by this most recent issue with his right hip. Patient had reported an improvement in his overall symptoms since beginning therapy of at least 50%, however, reported a decrease due to this current issue. Noted continued limitations in patient's lumbar spine mobility, and continued to report pain. Upon palpation, patient reported pain with CPAs and performed joint mobilization to decrease pain and improve mobility. Also performed long axis distraction of right hip which patient reported improved symptoms but did not eliminate them altogether. Patient noted to have increased stiffness with walking and transitions this session. Discussed with patient options for therapy and decided to have patient trialing performing his HEP for several weeks and to have a follow-up visit at that time to assess if most recent flare-up has resolved or persisted.    Personal Factors and Comorbidities  Age;Fitness;Comorbidity 2    Comorbidities  HTN, Kidney disease    Examination-Activity Limitations  Lift;Stand;Locomotion Level;Transfers;Squat;Stairs    Examination-Participation Restrictions  Yard Work;Cleaning;Meal Prep;Community Activity;Shop    Stability/Clinical Decision Making  Stable/Uncomplicated    Rehab Potential  Good    PT Frequency  Mon[place]    PT Duration  Other (comment)   1 follow-up visit within the next 4 weeks   PT Treatment/Interventions  ADLs/Self Care Home Management;Aquatic Therapy;Cryotherapy;Electrical Stimulation;Moist Heat;Traction;DME Instruction;Gait training;Stair training;Functional mobility  training;Therapeutic activities;Therapeutic exercise;Balance training;Neuromuscular re-education;Patient/family education;Orthotic Fit/Training;Manual techniques;Passive range of motion;Dry needling;Energy conservation;Taping    PT Next Visit Plan  Follow-up appointment in a few weeks    PT Home Exercise Plan  01/20/20: LTR 10x10''; 2/23: Seated posture/lumbar roll, SKTC, bridge; 02/06/20: Deep breathing for rib mobility; 02/13/20: Postural strengthening GTB rows and pallof press; 3/23: posterior pelvic tilt; 03/19/20: Upper extremity assisted circumduction of right hip in supine    Consulted and Agree with Plan of Care  Patient       Patient will benefit from skilled therapeutic intervention in order to improve the following deficits and impairments:  Improper body mechanics, Pain, Decreased mobility, Postural dysfunction, Decreased activity tolerance, Decreased endurance, Decreased range of motion, Decreased strength, Hypomobility  Visit Diagnosis: Bilateral low back pain, unspecified chronicity, unspecified whether sciatica present  Other symptoms and signs involving the musculoskeletal system     Problem List Patient Active Problem List   Diagnosis Date Noted  . Colon adenomas   . Insomnia 01/11/2016  . Internal hemorrhoids with complication 11/65/7903  . Obstructive sleep apnea 07/27/2013  . Seasonal and perennial allergic rhinitis 07/27/2013  . Hx of colonic polyps 11/15/2012  . UNSPECIFIED DISEASE OF THE JAWS 03/11/2010  . COLONIC POLYPS, ADENOMATOUS 06/30/2009  . HYPERLIPIDEMIA 10/13/2008  . GLOBUS HYSTERICUS 10/13/2008  . GASTROESOPHAGEAL REFLUX DISEASE 10/13/2008  . RENAL  CALCULUS 10/13/2008  . Dysphagia, idiopathic 10/13/2008  . ABDOMINAL PAIN 10/13/2008  . H N P-LUMBAR 06/23/2008  . SPINAL STENOSIS 06/23/2008  . LOW BACK PAIN 06/23/2008   Clarene Critchley PT, DPT 2:18 PM, 03/19/20 [facility] [place], [place], [address] Phone: [phone]   Fax:  [phone]  Name: Bruce Weber. MRN: [id number] Date of Birth: [birth year]

## 2020-03-23 ENCOUNTER — Emergency Department (HOSPITAL_COMMUNITY): Payer: Medicare Other

## 2020-03-23 ENCOUNTER — Emergency Department (HOSPITAL_COMMUNITY)
Admission: EM | Admit: 2020-03-23 | Discharge: 2020-03-23 | Disposition: A | Discharge status: Home / Self Care | Payer: Medicare Other | Attending: Emergency Medicine | Admitting: Emergency Medicine

## 2020-03-23 ENCOUNTER — Other Ambulatory Visit: Payer: Self-pay

## 2020-03-23 ENCOUNTER — Encounter (HOSPITAL_COMMUNITY): Payer: Self-pay | Admitting: Emergency Medicine

## 2020-03-23 DIAGNOSIS — M545 Low back pain, unspecified: Secondary | ICD-10-CM

## 2020-03-23 DIAGNOSIS — Z7984 Long term (current) use of oral hypoglycemic drugs: Secondary | ICD-10-CM | POA: Insufficient documentation

## 2020-03-23 DIAGNOSIS — Z79899 Other long term (current) drug therapy: Secondary | ICD-10-CM | POA: Diagnosis not present

## 2020-03-23 DIAGNOSIS — I10 Essential (primary) hypertension: Secondary | ICD-10-CM | POA: Insufficient documentation

## 2020-03-23 DIAGNOSIS — E119 Type 2 diabetes mellitus without complications: Secondary | ICD-10-CM | POA: Diagnosis not present

## 2020-03-23 MED ORDER — HYDROCODONE-ACETAMINOPHEN 5-325 MG PO TABS: 1.0000 | ORAL_TABLET | Freq: Four times a day (QID) | ORAL | 0 refills | Status: DC | PRN

## 2020-03-23 MED ORDER — DICLOFENAC SODIUM 1 % EX GEL: 2.0000 g | Freq: Four times a day (QID) | CUTANEOUS | 0 refills | Status: DC

## 2020-03-23 MED ORDER — HYDROCODONE-ACETAMINOPHEN 5-325 MG PO TABS
1.0000 | ORAL_TABLET | Freq: Once | ORAL | Status: AC
  Administered 2020-03-23: 1 via ORAL
  Filled 2020-03-23: qty 1

## 2020-03-23 NOTE — ED Triage Notes (Signed)
Seen by Dr Aline Brochure last [place] [facility]  Ongoing PT  PT on Friday - supposed to be his last   Now for the last 5 days pain and great difficulty getting up and down   "when I get up I'm all right"

## 2020-03-23 NOTE — ED Provider Notes (Signed)
[facility] EMERGENCY DEPARTMENT Provider Note   CSN: [id number] Arrival date & time: 03/23/20  1524     History Chief Complaint  Patient presents with  . Back Pain    Bruce Weber. is a 69 y.o. male.  HPI   Patient is a 69 year old male with a history of diabetes, GERD, hyperlipidemia, hypertension, nephrolithiasis, obesity, rectal bleeding, who presents the emergency department today for evaluation of low back pain.  States he has a history of chronic bilateral lower back pain that he follows with orthopedics for.  He has been in physical therapy for this for several weeks.  States he actually saw his orthopedic doctor last Wednesday 03/17/2020 and was feeling well.  States he actually walked home from his appointment that day.  He further states that he did a lot of housework last week for several days he was moving furniture and laid on the floor a few times.  He initially felt fine but after physical therapy on 03/19/2020 his back pain started worsening.  Pain is located to the midline lower back.  It is constant and severe in nature.  He is tried taking Tylenol without relief.  He also has a back brace which he uses and it has not provided any significant relief.  He denies radiation of pain.  Denies any bilateral lower extremity numbness, tingling, weakness, denies saddle anesthesia, loss control of bowel or bladder function, history of cancer or IVDU.  Denies any fevers, abdominal pain, chest pain, shortness of breath or urinary symptoms.  Past Medical History:  Diagnosis Date  . Adenoma 11/2009   1.1 cm from TCS  . DM (diabetes mellitus) ([place])   . GERD (gastroesophageal reflux disease)   . High cholesterol   . HTN (hypertension)   . Kidney stones   . Obesity (BMI 30-39.9) DEC 2011 223 LBS  . Rectal bleeding 2011   secondary to hemorrhoids    Patient Active Problem List   Diagnosis Date Noted  . Colon adenomas   . Insomnia 01/11/2016  . Internal hemorrhoids with  complication 37/90/2409  . Obstructive sleep apnea 07/27/2013  . Seasonal and perennial allergic rhinitis 07/27/2013  . Hx of colonic polyps 11/15/2012  . UNSPECIFIED DISEASE OF THE JAWS 03/11/2010  . COLONIC POLYPS, ADENOMATOUS 06/30/2009  . HYPERLIPIDEMIA 10/13/2008  . GLOBUS HYSTERICUS 10/13/2008  . GASTROESOPHAGEAL REFLUX DISEASE 10/13/2008  . RENAL CALCULUS 10/13/2008  . Dysphagia, idiopathic 10/13/2008  . ABDOMINAL PAIN 10/13/2008  . H N P-LUMBAR 06/23/2008  . SPINAL STENOSIS 06/23/2008  . LOW BACK PAIN 06/23/2008    Past Surgical History:  Procedure Laterality Date  . CIRCUMCISION    . COLONOSCOPY  Dec 2010 [facility]   MOD IH, SIMPLE ADENOMA 1.1 CM  . COLONOSCOPY  11/25/2012   SLF: 1. Sessile polyp measuring 30mm in size was found at the hepatic flesure; polypectomy was performed using snare cautery 2. Moderate diverticulosis was noted in the ascending colon and sigmoid colon. 3. The colon mucosa was otherwise normal 4. Large internal hemorrhoids.   . COLONOSCOPY N/A 03/01/2017   Procedure: COLONOSCOPY;  Surgeon: Danie Binder, MD;  [facility];  Service: Endoscopy;  Laterality: N/A;  10:30 AM  . EYE SURGERY  05/2018   bilateral cataract surgery  . HERNIA REPAIR         Family History  Problem Relation Age of Onset  . Lung cancer Father   . High blood pressure Father   . Colon cancer Neg Hx   .  Colon polyps Neg Hx     Social History   Tobacco Use  . Smoking status: Never Smoker  . Smokeless tobacco: Never Used  . Tobacco comment: Never smoked  Substance Use Topics  . Alcohol use: No  . Drug use: No    Home Medications Prior to Admission medications   Medication Sig Start Date End Date Taking? Authorizing Provider  acetaminophen (TYLENOL) 500 MG tablet Take 1,000 mg by mouth 2 (two) times daily as needed for headache.    [provider]  allopurinol (ZYLOPRIM) 100 MG tablet Take 300 mg by mouth daily.     [provider]   amLODipine (NORVASC) 10 MG tablet Take 10 mg by mouth daily.    [provider]  atorvastatin (LIPITOR) 10 MG tablet Take 1 tablet (10 mg total) by mouth daily. 03/08/20   Billie Ruddy, MD  calcitRIOL (ROCALTROL) 0.25 MCG capsule Take 0.25 mcg by mouth at bedtime.     [provider]  clomiPHENE (CLOMID) 50 MG tablet Take 25 mg by mouth at bedtime.     [provider]  cyclobenzaprine (FLEXERIL) 5 MG tablet TAKE 1 TABLET BY MOUTH AT BEDTIME AS NEEDED FOR MUSCLE SPASMS. 11/21/19   Billie Ruddy, MD  diclofenac Sodium (VOLTAREN) 1 % GEL Apply 2 g topically 4 (four) times daily. 03/23/20   Myna Freimark S, PA-C  famciclovir (FAMVIR) 500 MG tablet Take 1,000 mg by mouth 2 (two) times daily as needed (fever blisters).  02/19/17   [provider]  famotidine (PEPCID) 40 MG tablet TAKE 1 TABLET (40 MG TOTAL) BY MOUTH DAILY. WITH BREAKFAST 03/04/20   Erenest Rasher, PA-C  felodipine (PLENDIL) 10 MG 24 hr tablet Take 10 mg by mouth daily.    [provider]  fluticasone (FLONASE) 50 MCG/ACT nasal spray Place 1 spray into both nostrils daily as needed for allergies or rhinitis.    [provider]  furosemide (LASIX) 40 MG tablet Take 40 mg by mouth.    [provider]  gabapentin (NEURONTIN) 100 MG capsule Take 1 capsule (100 mg total) by mouth 3 (three) times daily. 01/14/20   Carole Civil, MD  hydrALAZINE (APRESOLINE) 25 MG tablet Take 25 mg by mouth 3 (three) times daily.    [provider]  HYDROcodone-acetaminophen (NORCO/VICODIN) 5-325 MG tablet Take 1 tablet by mouth every 6 (six) hours as needed. 03/23/20   Evarose Altland S, PA-C  propranolol (INDERAL) 80 MG tablet TAKE 1 TABLET (80 MG TOTAL) BY MOUTH 2 (TWO) TIMES DAILY. 03/08/20   Billie Ruddy, MD  psyllium (METAMUCIL) 58.6 % powder Take 1 packet by mouth daily. As needed for constipation    [provider]  traZODone (DESYREL) 50 MG tablet TAKE 1 TABLET  BY MOUTH EVERYDAY AT BEDTIME 07/11/19   Young, Clinton D, MD  triamcinolone (KENALOG) 0.025 % ointment Apply 1 application topically 2 (two) times daily. 12/10/19   Billie Ruddy, MD  vitamin C (ASCORBIC ACID) 500 MG tablet Take 500 mg by mouth 2 (two) times a week.     [provider]    Allergies    Dye fdc blue [brilliant blue fcf (fd&c blue #1)] and Iodinated diagnostic agents  Review of Systems   Review of Systems  Constitutional: Negative for fever.  Eyes: Negative for visual disturbance.  Respiratory: Negative for shortness of breath.   Cardiovascular: Negative for chest pain.  Gastrointestinal: Negative for abdominal pain, nausea and vomiting.  Genitourinary: Negative for dysuria and hematuria.       No loss of control of bowel or bladder function  Musculoskeletal: Positive for back pain.  Skin: Negative for rash.  Neurological: Negative for weakness and numbness.  All other systems reviewed and are negative.   Physical Exam Updated Vital Signs BP (!) 156/95   Pulse 75   Temp 98.1 F (36.7 C) (Oral)   Resp 18   Ht 5\' 10"  (1.778 m)   Wt 90.7 kg   SpO2 100%   BMI 28.70 kg/m   Physical Exam Vitals and nursing note reviewed.  Constitutional:      Appearance: He is well-developed.  HENT:     Head: Normocephalic and atraumatic.  Eyes:     Conjunctiva/sclera: Conjunctivae normal.  Cardiovascular:     Rate and Rhythm: Normal rate and regular rhythm.     Heart sounds: No murmur.  Pulmonary:     Effort: Pulmonary effort is normal. No respiratory distress.     Breath sounds: Normal breath sounds.  Abdominal:     Palpations: Abdomen is soft.     Tenderness: There is no abdominal tenderness.  Musculoskeletal:     Cervical back: Neck supple.     Comments: TTP to the lower lumbar spine with some bilat paraspinous TTP as well. 5/5 strength to the BLE. Normal sensation throughout.   Skin:    General: Skin is warm and dry.  Neurological:     Mental Status: He  is alert.     ED Results / Procedures / Treatments   Labs (all labs ordered are listed, but only abnormal results are displayed) Labs Reviewed - No data to display  EKG None  Radiology DG Lumbar Spine Complete  Result Date: 03/23/2020 CLINICAL DATA:  69 year old male with back pain. EXAM: LUMBAR SPINE - COMPLETE 4+ VIEW COMPARISON:  Lumbar spine radiograph dated 01/27/2019. FINDINGS: Five lumbar type vertebra. There is no acute fracture or subluxation of the lumbar spine. Mild degenerative changes and bone spurring. The visualized posterior elements are intact. Mild multilevel disc space narrowing. A 1 cm sclerotic focus over the right iliac wing, indeterminate, likely a bone island. The soft tissues are unremarkable. IMPRESSION: 1. No acute/traumatic lumbar spine pathology. 2. Mild degenerative changes. Electronically Signed   By: Anner Crete M.D.   On: 03/23/2020 18:13    Procedures Procedures (including critical care time)  Medications Ordered in ED Medications  HYDROcodone-acetaminophen (NORCO/VICODIN) 5-325 MG per tablet 1 tablet (1 tablet Oral Given 03/23/20 1644)    ED Course  I have reviewed the triage vital signs and the nursing notes.  Pertinent labs & imaging results that were available during my care of the patient were reviewed by me and considered in my medical decision making (see chart for details).    MDM Rules/Calculators/A&P                      12-year-old male with history of chronic back pain presenting for evaluation of acute exacerbation of his chronic back pain that started a few days ago after moving a bunch of furniture in his house and laying on the ground.  No red flag symptoms of back pain.  No neuro deficits.  Low concern for dissection or other emergent etiology.  Will get chest x-Treylen to rule out new compression fracture.  Will give pain medicines and reassess.  X-Coreon reviewed/interpreted.  Degenerative changes noted but no acute findings   Patient given a dose  of Norco in the ED.  On reassessment he states he feels somewhat improved.  He was able to ambulate in the room.  Discussed results of x-Jasten and plan for d/c home with pain meds and topical antiinflammatories. Advised to f/u with ortho and return to the ED for new or worsening symptoms. All questions answered, pt stable for discharge.   Final Clinical Impression(s) / ED Diagnoses Final diagnoses:  Midline low back pain without sciatica, unspecified chronicity    Rx / DC Orders ED Discharge Orders         Ordered    HYDROcodone-acetaminophen (NORCO/VICODIN) 5-325 MG tablet  Every 6 hours PRN     03/23/20 1837    diclofenac Sodium (VOLTAREN) 1 % GEL  4 times daily     03/23/20 1837           Melinda Gwinner, Willia Craze, PA-C 03/23/20 Lilly Cove, MD 03/23/20 2114

## 2020-03-23 NOTE — Discharge Instructions (Addendum)
Prescription given for Norco. Take medication as directed and do not operate machinery, drive a car, or work while taking this medication as it can make you drowsy.   Please use diclofenac gel as directed.   Please follow up with your orthopedic doctor within 5-7 days for re-evaluation of your symptoms.  Return to the emergency department immediately if you experience any back pain associated with fevers, loss of control of your bowels/bladder, weakness/numbness to your legs, numbness to your groin area, inability to walk, or inability to urinate.

## 2020-03-31 ENCOUNTER — Other Ambulatory Visit: Payer: Self-pay | Admitting: Internal Medicine

## 2020-04-01 NOTE — Telephone Encounter (Signed)
Dr. Annamaria Boots, please advise if you are okay refilling med.  Allergies  Allergen Reactions  . Dye Fdc Blue [Brilliant Blue Fcf (Fd&C Blue #1)]     PT UNSURE WHICH DYE  . Iodinated Diagnostic Agents Hives    30 YEARS AGO     Current Outpatient Medications:  .  acetaminophen (TYLENOL) 500 MG tablet, Take 1,000 mg by mouth 2 (two) times daily as needed for headache., Disp: , Rfl:  .  allopurinol (ZYLOPRIM) 100 MG tablet, Take 300 mg by mouth daily. , Disp: , Rfl:  .  amLODipine (NORVASC) 10 MG tablet, Take 10 mg by mouth daily., Disp: , Rfl:  .  atorvastatin (LIPITOR) 10 MG tablet, Take 1 tablet (10 mg total) by mouth daily., Disp: 90 tablet, Rfl: 3 .  calcitRIOL (ROCALTROL) 0.25 MCG capsule, Take 0.25 mcg by mouth at bedtime. , Disp: , Rfl:  .  clomiPHENE (CLOMID) 50 MG tablet, Take 25 mg by mouth at bedtime. , Disp: , Rfl:  .  cyclobenzaprine (FLEXERIL) 5 MG tablet, TAKE 1 TABLET BY MOUTH AT BEDTIME AS NEEDED FOR MUSCLE SPASMS., Disp: 30 tablet, Rfl: 3 .  diclofenac Sodium (VOLTAREN) 1 % GEL, Apply 2 g topically 4 (four) times daily., Disp: 50 g, Rfl: 0 .  famciclovir (FAMVIR) 500 MG tablet, Take 1,000 mg by mouth 2 (two) times daily as needed (fever blisters). , Disp: , Rfl: 5 .  famotidine (PEPCID) 40 MG tablet, TAKE 1 TABLET (40 MG TOTAL) BY MOUTH DAILY. WITH BREAKFAST, Disp: 90 tablet, Rfl: 3 .  felodipine (PLENDIL) 10 MG 24 hr tablet, Take 10 mg by mouth daily., Disp: , Rfl:  .  fluticasone (FLONASE) 50 MCG/ACT nasal spray, Place 1 spray into both nostrils daily as needed for allergies or rhinitis., Disp: , Rfl:  .  furosemide (LASIX) 40 MG tablet, Take 40 mg by mouth., Disp: , Rfl:  .  gabapentin (NEURONTIN) 100 MG capsule, Take 1 capsule (100 mg total) by mouth 3 (three) times daily., Disp: 90 capsule, Rfl: 2 .  hydrALAZINE (APRESOLINE) 25 MG tablet, Take 25 mg by mouth 3 (three) times daily., Disp: , Rfl:  .  HYDROcodone-acetaminophen (NORCO/VICODIN) 5-325 MG tablet, Take 1 tablet by  mouth every 6 (six) hours as needed., Disp: 8 tablet, Rfl: 0 .  propranolol (INDERAL) 80 MG tablet, TAKE 1 TABLET (80 MG TOTAL) BY MOUTH 2 (TWO) TIMES DAILY., Disp: 180 tablet, Rfl: 1 .  psyllium (METAMUCIL) 58.6 % powder, Take 1 packet by mouth daily. As needed for constipation, Disp: , Rfl:  .  traZODone (DESYREL) 50 MG tablet, TAKE 1 TABLET BY MOUTH EVERYDAY AT BEDTIME, Disp: 90 tablet, Rfl: 2 .  triamcinolone (KENALOG) 0.025 % ointment, Apply 1 application topically 2 (two) times daily., Disp: 30 g, Rfl: 0 .  vitamin C (ASCORBIC ACID) 500 MG tablet, Take 500 mg by mouth 2 (two) times a week. , Disp: , Rfl:

## 2020-04-01 NOTE — Telephone Encounter (Signed)
Trazodone refill e-sent 

## 2020-04-02 ENCOUNTER — Telehealth: Payer: Self-pay | Admitting: Orthopedic Surgery

## 2020-04-02 DIAGNOSIS — M545 Low back pain, unspecified: Secondary | ICD-10-CM

## 2020-04-02 NOTE — Telephone Encounter (Signed)
Ok I ll order (he may need to come back in first) but lets order it then if insur req another visit then we ll do that

## 2020-04-02 NOTE — Telephone Encounter (Signed)
[name] states that he is still having a lot of back pain and wants to know if Dr. Aline Brochure would order an MRI for him?  I told him that I would send a message to the clinical staff

## 2020-04-06 ENCOUNTER — Telehealth (HOSPITAL_COMMUNITY): Payer: Self-pay | Admitting: Physical Therapy

## 2020-04-06 NOTE — Telephone Encounter (Signed)
pt had to cx this appt due to he has to go back to the doctor.

## 2020-04-06 NOTE — Telephone Encounter (Signed)
Another visit should not be required. I will put in order, but have to call patient ask about claustro and metal first.

## 2020-04-07 ENCOUNTER — Ambulatory Visit (HOSPITAL_COMMUNITY): Payer: Medicare Other | Admitting: Physical Therapy

## 2020-04-08 DIAGNOSIS — E291 Testicular hypofunction: Secondary | ICD-10-CM | POA: Diagnosis not present

## 2020-04-08 DIAGNOSIS — N401 Enlarged prostate with lower urinary tract symptoms: Secondary | ICD-10-CM | POA: Diagnosis not present

## 2020-04-14 ENCOUNTER — Other Ambulatory Visit: Payer: Self-pay | Admitting: Family Medicine

## 2020-04-14 DIAGNOSIS — M545 Low back pain, unspecified: Secondary | ICD-10-CM

## 2020-04-19 ENCOUNTER — Encounter: Payer: Self-pay | Admitting: Orthopedic Surgery

## 2020-04-19 ENCOUNTER — Other Ambulatory Visit: Payer: Self-pay

## 2020-04-19 ENCOUNTER — Ambulatory Visit (INDEPENDENT_AMBULATORY_CARE_PROVIDER_SITE_OTHER): Payer: Medicare Other | Admitting: Orthopedic Surgery

## 2020-04-19 VITALS — Temp 97.2°F | Ht 70.0 in | Wt 204.0 lb

## 2020-04-19 DIAGNOSIS — N5201 Erectile dysfunction due to arterial insufficiency: Secondary | ICD-10-CM | POA: Diagnosis not present

## 2020-04-19 DIAGNOSIS — N401 Enlarged prostate with lower urinary tract symptoms: Secondary | ICD-10-CM | POA: Diagnosis not present

## 2020-04-19 DIAGNOSIS — R350 Frequency of micturition: Secondary | ICD-10-CM | POA: Diagnosis not present

## 2020-04-19 DIAGNOSIS — M545 Low back pain, unspecified: Secondary | ICD-10-CM

## 2020-04-19 DIAGNOSIS — E291 Testicular hypofunction: Secondary | ICD-10-CM | POA: Diagnosis not present

## 2020-04-19 NOTE — Patient Instructions (Signed)
Heating pad Muscle relaxer Gabapentin Follow-up after MRI

## 2020-04-19 NOTE — Progress Notes (Signed)
Chief Complaint  Patient presents with  . Follow-up    Recheck on L-spine.    69 year old male who we are following for lower back pain without any leg pain presents with continued lower back pain actually requiring ER visit after we saw him on the 14th went to the ER on the 20th  Now having lower back pain only he is on gabapentin Flexeril took some hydrocodone which seemed to help him in the ER does get a little bit of sleepiness from the medications  Recommend MRI on the 27th come back for follow-up and discussion of results and further treatment plan

## 2020-04-29 ENCOUNTER — Ambulatory Visit (HOSPITAL_COMMUNITY)
Admission: RE | Admit: 2020-04-29 | Discharge: 2020-04-29 | Disposition: A | Discharge status: Home / Self Care | Payer: Medicare Other | Source: Ambulatory Visit | Attending: Orthopedic Surgery | Admitting: Orthopedic Surgery

## 2020-04-29 ENCOUNTER — Other Ambulatory Visit: Payer: Self-pay

## 2020-04-29 DIAGNOSIS — M545 Low back pain, unspecified: Secondary | ICD-10-CM

## 2020-05-05 ENCOUNTER — Other Ambulatory Visit: Payer: Self-pay

## 2020-05-05 ENCOUNTER — Encounter: Payer: Self-pay | Admitting: Orthopedic Surgery

## 2020-05-05 ENCOUNTER — Ambulatory Visit (INDEPENDENT_AMBULATORY_CARE_PROVIDER_SITE_OTHER): Payer: Medicare Other | Admitting: Orthopedic Surgery

## 2020-05-05 VITALS — BP 138/75 | HR 68 | Ht 70.0 in | Wt 208.0 lb

## 2020-05-05 DIAGNOSIS — M48061 Spinal stenosis, lumbar region without neurogenic claudication: Secondary | ICD-10-CM | POA: Insufficient documentation

## 2020-05-05 DIAGNOSIS — M47812 Spondylosis without myelopathy or radiculopathy, cervical region: Secondary | ICD-10-CM | POA: Diagnosis not present

## 2020-05-05 DIAGNOSIS — M545 Low back pain, unspecified: Secondary | ICD-10-CM

## 2020-05-05 NOTE — Progress Notes (Signed)
Chief Complaint  Patient presents with   Back Pain   Results    review Lumbar MRI    Neck Pain    into left shoulder when turning head to the left     69 year old male here for review of his MRI.  He continues to complain of back pain with pain radiating to his right leg seems to be above the knee  His MRI was reviewed with the report and a model was shown to the patient to show that he has what I see as degenerative disc disease L5 levels with moderate levels of spinal foraminal stenosis.  The worst level was L3-4.  Patient also asked about his left shoulder he is having some pain in the left shoulder when he turned his neck to the left he has a history of degenerative disc disease cervical spine  He was able to turn the neck and reproduce the pain in the shoulder and also in the upper medial arm pain does not go below the elbow  He had excellent range of motion normal cuff strength no instability negative impingement signs  Neck was nontender  Past Medical History:  Diagnosis Date   Adenoma 11/2009   1.1 cm from TCS   DM (diabetes mellitus) ([place])    GERD (gastroesophageal reflux disease)    High cholesterol    HTN (hypertension)    Kidney stones    Obesity (BMI 30-39.9) DEC 2011 223 LBS   Rectal bleeding 2011   secondary to hemorrhoids   BP 138/75    Pulse 68    Ht 5\' 10"  (1.778 m)    Wt 208 lb (94.3 kg)    BMI 29.84 kg/m   Recommend epidural steroid injections  Follow-up in 8 weeks  Shoulder pain is most likely related to cervical spine dysfunction  Encounter Diagnoses  Name Primary?   Lumbar pain Yes   Foraminal stenosis of lumbar region    Spondylosis without myelopathy or radiculopathy, cervical region

## 2020-05-05 NOTE — Patient Instructions (Addendum)
You will get a call from our [facility] with the appointment for the injections in your back The number there is [phone] Dr Ernestina Patches is the doctor who will do the injections   If your neck and left shoulder get worse let us know and we can see you.

## 2020-05-10 DIAGNOSIS — Z8739 Personal history of other diseases of the musculoskeletal system and connective tissue: Secondary | ICD-10-CM | POA: Diagnosis not present

## 2020-05-10 DIAGNOSIS — Z87442 Personal history of urinary calculi: Secondary | ICD-10-CM | POA: Diagnosis not present

## 2020-05-10 DIAGNOSIS — E669 Obesity, unspecified: Secondary | ICD-10-CM | POA: Diagnosis not present

## 2020-05-10 DIAGNOSIS — D631 Anemia in chronic kidney disease: Secondary | ICD-10-CM | POA: Diagnosis not present

## 2020-05-10 DIAGNOSIS — G4733 Obstructive sleep apnea (adult) (pediatric): Secondary | ICD-10-CM | POA: Diagnosis not present

## 2020-05-10 DIAGNOSIS — Z9989 Dependence on other enabling machines and devices: Secondary | ICD-10-CM | POA: Diagnosis not present

## 2020-05-10 DIAGNOSIS — E1122 Type 2 diabetes mellitus with diabetic chronic kidney disease: Secondary | ICD-10-CM | POA: Diagnosis not present

## 2020-05-10 DIAGNOSIS — M1990 Unspecified osteoarthritis, unspecified site: Secondary | ICD-10-CM | POA: Diagnosis not present

## 2020-05-10 DIAGNOSIS — R809 Proteinuria, unspecified: Secondary | ICD-10-CM | POA: Diagnosis not present

## 2020-05-10 DIAGNOSIS — N2581 Secondary hyperparathyroidism of renal origin: Secondary | ICD-10-CM | POA: Diagnosis not present

## 2020-05-10 DIAGNOSIS — N184 Chronic kidney disease, stage 4 (severe): Secondary | ICD-10-CM | POA: Diagnosis not present

## 2020-05-10 DIAGNOSIS — I129 Hypertensive chronic kidney disease with stage 1 through stage 4 chronic kidney disease, or unspecified chronic kidney disease: Secondary | ICD-10-CM | POA: Diagnosis not present

## 2020-05-19 ENCOUNTER — Ambulatory Visit: Payer: Medicare Other | Admitting: Internal Medicine

## 2020-06-10 ENCOUNTER — Other Ambulatory Visit: Payer: Self-pay | Admitting: Orthopedic Surgery

## 2020-06-10 DIAGNOSIS — M545 Low back pain, unspecified: Secondary | ICD-10-CM

## 2020-06-15 ENCOUNTER — Ambulatory Visit (INDEPENDENT_AMBULATORY_CARE_PROVIDER_SITE_OTHER): Payer: Medicare Other | Admitting: Internal Medicine

## 2020-06-15 ENCOUNTER — Encounter: Payer: Self-pay | Admitting: Internal Medicine

## 2020-06-15 ENCOUNTER — Other Ambulatory Visit: Payer: Self-pay

## 2020-06-15 DIAGNOSIS — M545 Low back pain, unspecified: Secondary | ICD-10-CM

## 2020-06-15 DIAGNOSIS — G4733 Obstructive sleep apnea (adult) (pediatric): Secondary | ICD-10-CM | POA: Diagnosis not present

## 2020-06-15 NOTE — Patient Instructions (Signed)
Order- DME- please provide current CPAP download Continue auto 8-20, mask of choice, humidifier, supplies, AirView/ card   Please call if we can help

## 2020-06-15 NOTE — Progress Notes (Signed)
HPI M never smoker followed for OSA, complicated by GERD NPSG 04/15/13- moderate obstructive sleep apnea-AHI 22.5 per hour. CPAP 10 CWP. Weight 198 pounds.   --------------------------------------------------------------------------------- 01/10/18- 69 year old male never smoker followed for OSA, insomnia, complicated by GERD ----OSA; DME [facility]. Pt wears CPAP nighlty and DL attached. No new supplies needed and pressure works well. CPAP auto 8-20/ Advanced Ambien 5 mg Treated at [facility] for sinusitis with Z pak.  Asks referral to primary care to establish. Download 97% compliance, AHI 3.9/hour.  Sleeps much better with CPAP.  Bought So Clean machine and it gives him peace of mind.  05/20/2019- 69 year old male never smoker followed for OSA, insomnia, complicated by GERD, CKD4 CPAP auto 8-20/ [employer]            No DL available -----OSA, CPAP, SFK:CLEXN. Patient reports he uses nightly with no complaints.  Body weight today 207 lbs He sleeps much better with CPAP, used as directed. He has 2 homes and alternates between 2 machines About to retire, anticipating easier sleeping, but does have trouble initiating sleep. Had used ambien occasionally. Sleep hygiene discussed.  Body weight today 211 lbs Trazodone, Gabapentin,  Covid vax- 2 Phizer Degenerative disc disease- sleeping more in daytime due to pain meds. Discussed sleep hygiene.    ROS-see HPI + = positive Constitutional:   No-   weight loss, night sweats, fevers, chills, +fatigue, lassitude. HEENT:   No-  headaches, difficulty swallowing, tooth/dental problems, sore throat,       No-  sneezing, itching, ear ache, nasal congestion, +post nasal drip,  CV:  No-   chest pain, orthopnea, PND, swelling in lower extremities, anasarca, dizziness, palpitations Resp: No-   shortness of breath with exertion or at rest.              No-   productive cough,  No non-productive cough,  No- coughing up of blood.              No-   change in color of mucus.   No- wheezing.   Skin: No-   rash or lesions. GI:  No-   heartburn, indigestion, abdominal pain, nausea, vomiting,  GU:  MS:  No-   joint pain or swelling.   Neuro-     nothing unusual Psych:  No- change in mood or affect. No depression or anxiety.  No memory loss.  OBJ- Physical Exam General- Alert, Oriented, Affect-appropriate, Distress- none acute,  Skin- rash-none, lesions- none, excoriation- none Lymphadenopathy- none Head- atraumatic            Eyes- Gross vision intact, PERRLA, conjunctivae and secretions clear            Ears- Hearing, canals-normal            Nose-  sniffing,  turbinate edema, no-Septal dev, mucus, polyps, erosion, perforation             Throat- Mallampati III-IV , mucosa clear , drainage- none, tonsils- atrophic Neck- flexible , trachea midline, no stridor , thyroid nl, carotid no bruit Chest - symmetrical excursion , unlabored           Heart/CV- RRR , no murmur heard , no gallop  , no rub, nl s1 s2                           - JVD- none , edema- none, stasis changes- none, varices- none  Lung- clear to P&A, wheeze- none, cough- none , dullness-none, rub- none           Chest wall-  Abd-  Br/ Gen/ Rectal- Not done, not indicated Extrem- cyanosis- none, clubbing, none, atrophy- none, strength- nl Neuro- grossly intact to observation

## 2020-06-21 ENCOUNTER — Ambulatory Visit (INDEPENDENT_AMBULATORY_CARE_PROVIDER_SITE_OTHER): Payer: Medicare Other | Admitting: Physical Medicine and Rehabilitation

## 2020-06-21 ENCOUNTER — Other Ambulatory Visit: Payer: Self-pay

## 2020-06-21 ENCOUNTER — Ambulatory Visit: Payer: Self-pay

## 2020-06-21 ENCOUNTER — Encounter: Payer: Self-pay | Admitting: Physical Medicine and Rehabilitation

## 2020-06-21 VITALS — BP 151/83 | HR 60

## 2020-06-21 DIAGNOSIS — M5416 Radiculopathy, lumbar region: Secondary | ICD-10-CM

## 2020-06-21 MED ORDER — METHYLPREDNISOLONE ACETATE 80 MG/ML IJ SUSP
80.0000 mg | Freq: Once | INTRAMUSCULAR | Status: AC
  Administered 2020-06-21: 80 mg

## 2020-06-21 NOTE — Progress Notes (Signed)
Pt states lower back pain. Pt states the both side of his hip cause some pain. pt states sitting and trying to get comfortable makes the pain worse. Pt states laying down make the pain better.   Numeric Pain Rating Scale and Functional Assessment Average Pain 6   In the last MONTH (on 0-10 scale) has pain interfered with the following?  1. General activity like being  able to carry out your everyday physical activities such as walking, climbing stairs, carrying groceries, or moving a chair?  Rating(8)   +Driver, -BT, -Dye Allergies.

## 2020-06-30 ENCOUNTER — Ambulatory Visit (INDEPENDENT_AMBULATORY_CARE_PROVIDER_SITE_OTHER): Payer: Medicare Other | Admitting: Orthopedic Surgery

## 2020-06-30 ENCOUNTER — Encounter: Payer: Self-pay | Admitting: Orthopedic Surgery

## 2020-06-30 ENCOUNTER — Other Ambulatory Visit: Payer: Self-pay

## 2020-06-30 VITALS — BP 129/68 | HR 66 | Ht 70.0 in | Wt 210.0 lb

## 2020-06-30 DIAGNOSIS — M48061 Spinal stenosis, lumbar region without neurogenic claudication: Secondary | ICD-10-CM

## 2020-06-30 DIAGNOSIS — M47812 Spondylosis without myelopathy or radiculopathy, cervical region: Secondary | ICD-10-CM

## 2020-06-30 DIAGNOSIS — M545 Low back pain, unspecified: Secondary | ICD-10-CM

## 2020-06-30 NOTE — Progress Notes (Signed)
Chief Complaint  Patient presents with  . Back Pain    had one ESI helped but only for a few days    69 year old male with pain in his lower back radiating in his left leg we did get an MRI he had 1 ESI He says he did get relief for couple of days still has some pain in the back and left side of his hip  .  cyclobenzaprine (FLEXERIL) 5 MG tablet, TAKE 1 TABLET BY MOUTH AT BEDTIME AS NEEDED FOR MUSCLE SPASMS., Disp: 30 tablet, Rfl: 3 .  diclofenac Sodium (VOLTAREN) 1 % GEL, Apply 2 g topically 4 (four) times daily., Disp: 50 g, Rfl: 0 .  gabapentin (NEURONTIN) 100 MG capsule, TAKE 1 CAPSULE BY MOUTH THREE TIMES A DAY, Disp: 90 capsule, Rfl: 2 .  HYDROcodone-acetaminophen (NORCO/VICODIN) 5-325 MG tablet, Take 1 tablet by mouth every 6 (six) hours as needed., Disp: 8 tablet, Rfl: 0   MOST RECENT MRI L1-L2: Disc bulge and mild facet arthropathy with ligamentum flavum infolding. No significant canal or right foraminal stenosis. Minor left foraminal stenosis.   L2-L3: Disc bulge and mild facet arthropathy with ligamentum flavum infolding. No significant canal stenosis. Mild right foraminal stenosis. Minor left foraminal stenosis.   L3-L4: Disc bulge and mild facet arthropathy with ligamentum flavum infolding. No significant canal stenosis. Mild to moderate foraminal stenosis.   L4-L5: Disc bulge and mild facet arthropathy with ligamentum flavum infolding. No significant canal stenosis. Mild foraminal stenosis.   L5-S1: Disc bulge with small central and right subarticular annular fissures. No canal or foraminal stenosis.   IMPRESSION: Multilevel degenerative changes as detailed above. There is no high-grade canal stenosis. Foraminal stenosis is greatest at L3-L4.     Electronically Signed   By: Macy Mis M.D.   On: 04/30/2020 11:49   Recommend repeat ESI and then follow-up a few days after.

## 2020-07-08 ENCOUNTER — Other Ambulatory Visit: Payer: Self-pay

## 2020-07-08 ENCOUNTER — Ambulatory Visit (INDEPENDENT_AMBULATORY_CARE_PROVIDER_SITE_OTHER): Payer: Medicare Other | Admitting: Physical Medicine and Rehabilitation

## 2020-07-08 ENCOUNTER — Ambulatory Visit: Payer: Self-pay

## 2020-07-08 ENCOUNTER — Other Ambulatory Visit: Payer: Medicare Other

## 2020-07-08 ENCOUNTER — Telehealth: Payer: Self-pay | Admitting: Family Medicine

## 2020-07-08 ENCOUNTER — Encounter: Payer: Self-pay | Admitting: Physical Medicine and Rehabilitation

## 2020-07-08 VITALS — BP 145/83 | HR 60

## 2020-07-08 DIAGNOSIS — M5416 Radiculopathy, lumbar region: Secondary | ICD-10-CM | POA: Diagnosis not present

## 2020-07-08 DIAGNOSIS — M48061 Spinal stenosis, lumbar region without neurogenic claudication: Secondary | ICD-10-CM

## 2020-07-08 DIAGNOSIS — Z20822 Contact with and (suspected) exposure to covid-19: Secondary | ICD-10-CM

## 2020-07-08 MED ORDER — METHYLPREDNISOLONE ACETATE 80 MG/ML IJ SUSP
40.0000 mg | Freq: Once | INTRAMUSCULAR | Status: AC
  Administered 2020-07-08: 40 mg

## 2020-07-08 NOTE — Telephone Encounter (Signed)
Attempted to schedule AWV. Unable to LVM.  Will try at later time.  

## 2020-07-08 NOTE — Telephone Encounter (Signed)
Pt called the office back and will give the office a call back once he is out of his ortho appointment today.

## 2020-07-08 NOTE — Progress Notes (Signed)
Pt states lower back pain. Pt has hx of inj on 06/21/20 pt states that it last for four days then his left side started to hurt.  Numeric Pain Rating Scale and Functional Assessment Average Pain 3   In the last MONTH (on 0-10 scale) has pain interfered with the following?  1. General activity like being  able to carry out your everyday physical activities such as walking, climbing stairs, carrying groceries, or moving a chair?  Rating(5)   +Driver, -BT, -Dye Allergies.

## 2020-07-09 LAB — SARS-COV-2, NAA 2 DAY TAT

## 2020-07-09 LAB — NOVEL CORONAVIRUS, NAA: SARS-CoV-2, NAA: NOT DETECTED

## 2020-07-09 NOTE — Progress Notes (Signed)
Bruce Weber. - 69 y.o. male MRN [id number]  Date of birth: 1951/06/07  Office Visit Note: Visit Date: 06/21/2020 PCP: Billie Ruddy, MD Referred by: Billie Ruddy, MD  Subjective: Chief Complaint  Patient presents with  . Lower Back - Pain  . Left Hip - Pain  . Right Hip - Pain   HPI:  Bruce Weber. is a 69 y.o. male who comes in today at the request of Dr. Arther Abbott for planned Bilateral L3-L4 Lumbar epidural steroid injection with fluoroscopic guidance.  The patient has failed conservative care including physical therapy at [facility], home exercise, medications, time and activity modification.  This injection will be diagnostic and hopefully therapeutic.  Please see requesting physician notes for further details and justification.  MRI reviewed with images and spine model.  MRI reviewed in the note below.  MRI really with mild findings some foraminal narrowing at L3 which could cause some of his symptoms into the hips.  Small disc protrusion at L5-S1 not compressive.  Depending on relief diagnostically could look at lower lumbar injection or facet block.    ROS Otherwise per HPI.  Assessment & Plan: Visit Diagnoses:  1. Lumbar radiculopathy     Plan: No additional findings.   Meds & Orders:  Meds ordered this encounter  Medications  . methylPREDNISolone acetate (DEPO-MEDROL) injection 80 mg    Orders Placed This Encounter  Procedures  . XR C-ARM NO REPORT  . Epidural Steroid injection    Follow-up: Return if symptoms worsen or fail to improve.   Procedures: No procedures performed  Lumbosacral Transforaminal Epidural Steroid Injection - Sub-Pedicular Approach with Fluoroscopic Guidance  Patient: Bruce Weber.      Date of Birth: 12-Nov-1951 MRN: [id number] PCP: Billie Ruddy, MD      Visit Date: 06/21/2020   Universal Protocol:    Date/Time: 06/21/2020  Consent Given By: the patient  Position: PRONE  Additional Comments: Vital  signs were monitored before and after the procedure. Patient was prepped and draped in the usual sterile fashion. The correct patient, procedure, and site was verified.   Injection Procedure Details:  Procedure Site One Meds Administered:  Meds ordered this encounter  Medications  . methylPREDNISolone acetate (DEPO-MEDROL) injection 80 mg    Laterality: Bilateral  Location/Site:  L3-L4  Needle size: 22 G  Needle type: Spinal  Needle Placement: Transforaminal  Findings:    -Comments: Excellent flow of contrast along the nerve, nerve root and into the epidural space.  Procedure Details: After squaring off the end-plates to get a true AP view, the C-arm was positioned so that an oblique view of the foramen as noted above was visualized. The target area is just inferior to the "nose of the scotty dog" or sub pedicular. The soft tissues overlying this structure were infiltrated with 2-3 ml. of 1% Lidocaine without Epinephrine.  The spinal needle was inserted toward the target using a "trajectory" view along the fluoroscope beam.  Under AP and lateral visualization, the needle was advanced so it did not puncture dura and was located close the 6 O'Clock position of the pedical in AP tracterory. Biplanar projections were used to confirm position. Aspiration was confirmed to be negative for CSF and/or blood. A 1-2 ml. volume of Isovue-250 was injected and flow of contrast was noted at each level. Radiographs were obtained for documentation purposes.   After attaining the desired flow of contrast documented above, a 0.5 to 1.0  ml test dose of 0.25% Marcaine was injected into each respective transforaminal space.  The patient was observed for 90 seconds post injection.  After no sensory deficits were reported, and normal lower extremity motor function was noted,   the above injectate was administered so that equal amounts of the injectate were placed at each foramen (level) into the  transforaminal epidural space.   Additional Comments:  The patient tolerated the procedure well Dressing: 2 x 2 sterile gauze and Band-Aid    Post-procedure details: Patient was observed during the procedure. Post-procedure instructions were reviewed.  Patient left the clinic in stable condition.      Clinical History: CLINICAL DATA:  Low back pain radiating down both legs  EXAM: MRI LUMBAR SPINE WITHOUT CONTRAST  TECHNIQUE: Multiplanar, multisequence MR imaging of the lumbar spine was performed. No intravenous contrast was administered.  COMPARISON:  No recent imaging, prior is from 2010  FINDINGS: Segmentation:  Standard.  Alignment:  Anteroposterior alignment is maintained.  Vertebrae: Vertebral body heights are preserved. There is moderate marrow edema adjacent to a Schmorl's node at the inferior endplate of L1. Mild opposing endplate marrow edema is also present at L2. No suspicious osseous lesion.  Conus medullaris and cauda equina: Conus extends to the L1 level. Conus and cauda equina appear normal.  Paraspinal and other soft tissues: Unremarkable.  Disc levels:  L1-L2: Disc bulge and mild facet arthropathy with ligamentum flavum infolding. No significant canal or right foraminal stenosis. Minor left foraminal stenosis.  L2-L3: Disc bulge and mild facet arthropathy with ligamentum flavum infolding. No significant canal stenosis. Mild right foraminal stenosis. Minor left foraminal stenosis.  L3-L4: Disc bulge and mild facet arthropathy with ligamentum flavum infolding. No significant canal stenosis. Mild to moderate foraminal stenosis.  L4-L5: Disc bulge and mild facet arthropathy with ligamentum flavum infolding. No significant canal stenosis. Mild foraminal stenosis.  L5-S1: Disc bulge with small central and right subarticular annular fissures. No canal or foraminal stenosis.  IMPRESSION: Multilevel degenerative changes as  detailed above. There is no high-grade canal stenosis. Foraminal stenosis is greatest at L3-L4.   Electronically Signed   By: Macy Mis M.D.   On: 04/30/2020 11:49     Objective:  VS:  HT:    WT:   BMI:     BP:(!) 151/83  HR:60bpm  TEMP: ( )  RESP:  Physical Exam Constitutional:      General: He is not in acute distress.    Appearance: Normal appearance. He is not ill-appearing.  HENT:     Head: Normocephalic and atraumatic.     Right Ear: External ear normal.     Left Ear: External ear normal.  Eyes:     Extraocular Movements: Extraocular movements intact.  Cardiovascular:     Rate and Rhythm: Normal rate.     Pulses: Normal pulses.  Abdominal:     General: There is no distension.     Palpations: Abdomen is soft.  Musculoskeletal:        General: No tenderness or signs of injury.     Right lower leg: No edema.     Left lower leg: No edema.     Comments: Patient has good distal strength without clonus.  Skin:    Findings: No erythema or rash.  Neurological:     General: No focal deficit present.     Mental Status: He is alert and oriented to person, place, and time.     Sensory: No sensory deficit.  Motor: No weakness or abnormal muscle tone.     Coordination: Coordination normal.  Psychiatric:        Mood and Affect: Mood normal.        Behavior: Behavior normal.      Imaging: Epidural Steroid injection  Result Date: 07/08/2020 Magnus Sinning, MD     07/09/2020  6:38 AM Lumbosacral Transforaminal Epidural Steroid Injection - Sub-Pedicular Approach with Fluoroscopic Guidance Patient: Tanush Drees.     Date of Birth: May 27, 1951 MRN: [id number] PCP: Billie Ruddy, MD     Visit Date: 07/08/2020  Universal Protocol:   Date/Time: 07/08/2020 Consent Given By: the patient Position: PRONE Additional Comments: Vital signs were monitored before and after the procedure. Patient was prepped and draped in the usual sterile fashion. The correct patient,  procedure, and site was verified. Injection Procedure Details: Procedure Site One Meds Administered: Meds ordered this encounter Medications . methylPREDNISolone acetate (DEPO-MEDROL) injection 40 mg Laterality: Left Location/Site: L3-L4 Needle size: 22 G Needle type: Spinal Needle Placement: Transforaminal Findings:   -Comments: Excellent flow of contrast along the nerve, nerve root and into the epidural space. Procedure Details: After squaring off the end-plates to get a true AP view, the C-arm was positioned so that an oblique view of the foramen as noted above was visualized. The target area is just inferior to the "nose of the scotty dog" or sub pedicular. The soft tissues overlying this structure were infiltrated with 2-3 ml. of 1% Lidocaine without Epinephrine. The spinal needle was inserted toward the target using a "trajectory" view along the fluoroscope beam.  Under AP and lateral visualization, the needle was advanced so it did not puncture dura and was located close the 6 O'Clock position of the pedical in AP tracterory. Biplanar projections were used to confirm position. Aspiration was confirmed to be negative for CSF and/or blood. A 1-2 ml. volume of Isovue-250 was injected and flow of contrast was noted at each level. Radiographs were obtained for documentation purposes. After attaining the desired flow of contrast documented above, a 0.5 to 1.0 ml test dose of 0.25% Marcaine was injected into each respective transforaminal space.  The patient was observed for 90 seconds post injection.  After no sensory deficits were reported, and normal lower extremity motor function was noted,   the above injectate was administered so that equal amounts of the injectate were placed at each foramen (level) into the transforaminal epidural space. Additional Comments: The patient tolerated the procedure well Dressing: 2 x 2 sterile gauze and Band-Aid  Post-procedure details: Patient was observed during the procedure.  Post-procedure instructions were reviewed. Patient left the clinic in stable condition.   XR C-ARM NO REPORT  Result Date: 07/08/2020 Please see Notes tab for imaging impression.

## 2020-07-09 NOTE — Procedures (Signed)
Lumbosacral Transforaminal Epidural Steroid Injection - Sub-Pedicular Approach with Fluoroscopic Guidance  Patient: Bruce Weber.      Date of Birth: 09/14/1951 MRN: [id number] PCP: Billie Ruddy, MD      Visit Date: 06/21/2020   Universal Protocol:    Date/Time: 06/21/2020  Consent Given By: the patient  Position: PRONE  Additional Comments: Vital signs were monitored before and after the procedure. Patient was prepped and draped in the usual sterile fashion. The correct patient, procedure, and site was verified.   Injection Procedure Details:  Procedure Site One Meds Administered:  Meds ordered this encounter  Medications  . methylPREDNISolone acetate (DEPO-MEDROL) injection 80 mg    Laterality: Bilateral  Location/Site:  L3-L4  Needle size: 22 G  Needle type: Spinal  Needle Placement: Transforaminal  Findings:    -Comments: Excellent flow of contrast along the nerve, nerve root and into the epidural space.  Procedure Details: After squaring off the end-plates to get a true AP view, the C-arm was positioned so that an oblique view of the foramen as noted above was visualized. The target area is just inferior to the "nose of the scotty dog" or sub pedicular. The soft tissues overlying this structure were infiltrated with 2-3 ml. of 1% Lidocaine without Epinephrine.  The spinal needle was inserted toward the target using a "trajectory" view along the fluoroscope beam.  Under AP and lateral visualization, the needle was advanced so it did not puncture dura and was located close the 6 O'Clock position of the pedical in AP tracterory. Biplanar projections were used to confirm position. Aspiration was confirmed to be negative for CSF and/or blood. A 1-2 ml. volume of Isovue-250 was injected and flow of contrast was noted at each level. Radiographs were obtained for documentation purposes.   After attaining the desired flow of contrast documented above, a 0.5 to 1.0  ml test dose of 0.25% Marcaine was injected into each respective transforaminal space.  The patient was observed for 90 seconds post injection.  After no sensory deficits were reported, and normal lower extremity motor function was noted,   the above injectate was administered so that equal amounts of the injectate were placed at each foramen (level) into the transforaminal epidural space.   Additional Comments:  The patient tolerated the procedure well Dressing: 2 x 2 sterile gauze and Band-Aid    Post-procedure details: Patient was observed during the procedure. Post-procedure instructions were reviewed.  Patient left the clinic in stable condition.

## 2020-07-09 NOTE — Procedures (Signed)
Lumbosacral Transforaminal Epidural Steroid Injection - Sub-Pedicular Approach with Fluoroscopic Guidance  Patient: Bruce Weber.      Date of Birth: [birth year] MRN: [id number] PCP: Billie Ruddy, MD      Visit Date: 07/08/2020   Universal Protocol:    Date/Time: 07/08/2020  Consent Given By: the patient  Position: PRONE  Additional Comments: Vital signs were monitored before and after the procedure. Patient was prepped and draped in the usual sterile fashion. The correct patient, procedure, and site was verified.   Injection Procedure Details:  Procedure Site One Meds Administered:  Meds ordered this encounter  Medications  . methylPREDNISolone acetate (DEPO-MEDROL) injection 40 mg    Laterality: Left  Location/Site:  L3-L4  Needle size: 22 G  Needle type: Spinal  Needle Placement: Transforaminal  Findings:    -Comments: Excellent flow of contrast along the nerve, nerve root and into the epidural space.  Procedure Details: After squaring off the end-plates to get a true AP view, the C-arm was positioned so that an oblique view of the foramen as noted above was visualized. The target area is just inferior to the "nose of the scotty dog" or sub pedicular. The soft tissues overlying this structure were infiltrated with 2-3 ml. of 1% Lidocaine without Epinephrine.  The spinal needle was inserted toward the target using a "trajectory" view along the fluoroscope beam.  Under AP and lateral visualization, the needle was advanced so it did not puncture dura and was located close the 6 O'Clock position of the pedical in AP tracterory. Biplanar projections were used to confirm position. Aspiration was confirmed to be negative for CSF and/or blood. A 1-2 ml. volume of Isovue-250 was injected and flow of contrast was noted at each level. Radiographs were obtained for documentation purposes.   After attaining the desired flow of contrast documented above, a 0.5 to 1.0 ml  test dose of 0.25% Marcaine was injected into each respective transforaminal space.  The patient was observed for 90 seconds post injection.  After no sensory deficits were reported, and normal lower extremity motor function was noted,   the above injectate was administered so that equal amounts of the injectate were placed at each foramen (level) into the transforaminal epidural space.   Additional Comments:  The patient tolerated the procedure well Dressing: 2 x 2 sterile gauze and Band-Aid    Post-procedure details: Patient was observed during the procedure. Post-procedure instructions were reviewed.  Patient left the clinic in stable condition.

## 2020-07-09 NOTE — Progress Notes (Signed)
Ngai Delma Officer. - 69 y.o. male MRN [id number]  Date of birth: [birth year]  Office Visit Note: Visit Date: 07/08/2020 PCP: Billie Ruddy, MD Referred by: Billie Ruddy, MD  Subjective: Chief Complaint  Patient presents with  . Lower Back - Pain  . Left Hip - Pain   HPI:  Bruce Weber. is a 68 y.o. male who comes in today For possible repeat epidural injection.  Patient had bilateral L3 transforaminal injection on July 19.  He reports good relief for about a week and then he started having symptoms on the left side once again.  Interestingly symptoms are really over the iliac crest and laterally over the flank.  Nothing in the hip or leg at this point.  No paresthesias.  MRI of the lumbar spine not very specific or very troublesome.  He has mild foraminal narrowing at L3.  I will repeat the left L3 transforaminal injection because diagnostically he did get some relief.  At this point I am going to write a prescription for chiropractic care which she has done in the past to see if this is more mechanical or muscular.  He has had physical therapy at [facility].  Continue to follow-up with Dr. Arther Abbott.    A "series of 3" injections is not recommended and is outdated in the age of fluoroscopically guided injections as well as advanced imaging of the spine.  The injections are used both diagnostically and therapeutically and usually 1 to 2 injections will effectively treat the patient.  Occasionally there is another pain source identified and we may even do more than 3 injections depending on the source of pain.  A lot of insurance companies will not allow repeat injections without evaluation.  ROS Otherwise per HPI.  Assessment & Plan: Visit Diagnoses:  1. Lumbar radiculopathy   2. Foraminal stenosis of lumbar region     Plan: No additional findings.   Meds & Orders:  Meds ordered this encounter  Medications  . methylPREDNISolone acetate (DEPO-MEDROL) injection 40 mg      Orders Placed This Encounter  Procedures  . XR C-ARM NO REPORT  . Epidural Steroid injection    Follow-up: No follow-ups on file.   Procedures: No procedures performed  Lumbosacral Transforaminal Epidural Steroid Injection - Sub-Pedicular Approach with Fluoroscopic Guidance  Patient: Bruce Weber.      Date of Birth: Nov 06, 1951 MRN: [id number] PCP: Billie Ruddy, MD      Visit Date: 07/08/2020   Universal Protocol:    Date/Time: 07/08/2020  Consent Given By: the patient  Position: PRONE  Additional Comments: Vital signs were monitored before and after the procedure. Patient was prepped and draped in the usual sterile fashion. The correct patient, procedure, and site was verified.   Injection Procedure Details:  Procedure Site One Meds Administered:  Meds ordered this encounter  Medications  . methylPREDNISolone acetate (DEPO-MEDROL) injection 40 mg    Laterality: Left  Location/Site:  L3-L4  Needle size: 22 G  Needle type: Spinal  Needle Placement: Transforaminal  Findings:    -Comments: Excellent flow of contrast along the nerve, nerve root and into the epidural space.  Procedure Details: After squaring off the end-plates to get a true AP view, the C-arm was positioned so that an oblique view of the foramen as noted above was visualized. The target area is just inferior to the "nose of the scotty dog" or sub pedicular. The soft tissues overlying this structure  were infiltrated with 2-3 ml. of 1% Lidocaine without Epinephrine.  The spinal needle was inserted toward the target using a "trajectory" view along the fluoroscope beam.  Under AP and lateral visualization, the needle was advanced so it did not puncture dura and was located close the 6 O'Clock position of the pedical in AP tracterory. Biplanar projections were used to confirm position. Aspiration was confirmed to be negative for CSF and/or blood. A 1-2 ml. volume of Isovue-250 was injected  and flow of contrast was noted at each level. Radiographs were obtained for documentation purposes.   After attaining the desired flow of contrast documented above, a 0.5 to 1.0 ml test dose of 0.25% Marcaine was injected into each respective transforaminal space.  The patient was observed for 90 seconds post injection.  After no sensory deficits were reported, and normal lower extremity motor function was noted,   the above injectate was administered so that equal amounts of the injectate were placed at each foramen (level) into the transforaminal epidural space.   Additional Comments:  The patient tolerated the procedure well Dressing: 2 x 2 sterile gauze and Band-Aid    Post-procedure details: Patient was observed during the procedure. Post-procedure instructions were reviewed.  Patient left the clinic in stable condition.      Clinical History: CLINICAL DATA:  Low back pain radiating down both legs  EXAM: MRI LUMBAR SPINE WITHOUT CONTRAST  TECHNIQUE: Multiplanar, multisequence MR imaging of the lumbar spine was performed. No intravenous contrast was administered.  COMPARISON:  No recent imaging, prior is from 2010  FINDINGS: Segmentation:  Standard.  Alignment:  Anteroposterior alignment is maintained.  Vertebrae: Vertebral body heights are preserved. There is moderate marrow edema adjacent to a Schmorl's node at the inferior endplate of L1. Mild opposing endplate marrow edema is also present at L2. No suspicious osseous lesion.  Conus medullaris and cauda equina: Conus extends to the L1 level. Conus and cauda equina appear normal.  Paraspinal and other soft tissues: Unremarkable.  Disc levels:  L1-L2: Disc bulge and mild facet arthropathy with ligamentum flavum infolding. No significant canal or right foraminal stenosis. Minor left foraminal stenosis.  L2-L3: Disc bulge and mild facet arthropathy with ligamentum flavum infolding. No significant  canal stenosis. Mild right foraminal stenosis. Minor left foraminal stenosis.  L3-L4: Disc bulge and mild facet arthropathy with ligamentum flavum infolding. No significant canal stenosis. Mild to moderate foraminal stenosis.  L4-L5: Disc bulge and mild facet arthropathy with ligamentum flavum infolding. No significant canal stenosis. Mild foraminal stenosis.  L5-S1: Disc bulge with small central and right subarticular annular fissures. No canal or foraminal stenosis.  IMPRESSION: Multilevel degenerative changes as detailed above. There is no high-grade canal stenosis. Foraminal stenosis is greatest at L3-L4.   Electronically Signed   By: Macy Mis M.D.   On: 04/30/2020 11:49     Objective:  VS:  HT:    WT:   BMI:     BP:(!) 145/83  HR:60bpm  TEMP: ( )  RESP:  Physical Exam Constitutional:      General: He is not in acute distress.    Appearance: Normal appearance. He is not ill-appearing.  HENT:     Head: Normocephalic and atraumatic.     Right Ear: External ear normal.     Left Ear: External ear normal.  Eyes:     Extraocular Movements: Extraocular movements intact.  Cardiovascular:     Rate and Rhythm: Normal rate.     Pulses: Normal pulses.  Abdominal:     General: There is no distension.     Palpations: Abdomen is soft.  Musculoskeletal:        General: No tenderness or signs of injury.     Right lower leg: No edema.     Left lower leg: No edema.     Comments: Patient has good distal strength without clonus.  He does arise from a sitting position to standing with some low back pain with facet loading.  His pain to palpation is laterally over the iliac crest.  No induration or mass noted.  Possible trigger point is tender.  Skin:    Findings: No erythema or rash.  Neurological:     General: No focal deficit present.     Mental Status: He is alert and oriented to person, place, and time.     Sensory: No sensory deficit.     Motor: No weakness or  abnormal muscle tone.     Coordination: Coordination normal.  Psychiatric:        Mood and Affect: Mood normal.        Behavior: Behavior normal.      Imaging: XR C-ARM NO REPORT  Result Date: 07/08/2020 Please see Notes tab for imaging impression.

## 2020-07-23 DIAGNOSIS — M47816 Spondylosis without myelopathy or radiculopathy, lumbar region: Secondary | ICD-10-CM | POA: Diagnosis not present

## 2020-07-23 DIAGNOSIS — M9901 Segmental and somatic dysfunction of cervical region: Secondary | ICD-10-CM | POA: Diagnosis not present

## 2020-07-23 DIAGNOSIS — M9902 Segmental and somatic dysfunction of thoracic region: Secondary | ICD-10-CM | POA: Diagnosis not present

## 2020-07-23 DIAGNOSIS — M5126 Other intervertebral disc displacement, lumbar region: Secondary | ICD-10-CM | POA: Diagnosis not present

## 2020-07-23 DIAGNOSIS — M9903 Segmental and somatic dysfunction of lumbar region: Secondary | ICD-10-CM | POA: Diagnosis not present

## 2020-07-23 DIAGNOSIS — S233XXA Sprain of ligaments of thoracic spine, initial encounter: Secondary | ICD-10-CM | POA: Diagnosis not present

## 2020-07-23 DIAGNOSIS — M47812 Spondylosis without myelopathy or radiculopathy, cervical region: Secondary | ICD-10-CM | POA: Diagnosis not present

## 2020-07-26 DIAGNOSIS — S233XXA Sprain of ligaments of thoracic spine, initial encounter: Secondary | ICD-10-CM | POA: Diagnosis not present

## 2020-07-26 DIAGNOSIS — M47816 Spondylosis without myelopathy or radiculopathy, lumbar region: Secondary | ICD-10-CM | POA: Diagnosis not present

## 2020-07-26 DIAGNOSIS — M47812 Spondylosis without myelopathy or radiculopathy, cervical region: Secondary | ICD-10-CM | POA: Diagnosis not present

## 2020-07-26 DIAGNOSIS — M9903 Segmental and somatic dysfunction of lumbar region: Secondary | ICD-10-CM | POA: Diagnosis not present

## 2020-07-26 DIAGNOSIS — M5126 Other intervertebral disc displacement, lumbar region: Secondary | ICD-10-CM | POA: Diagnosis not present

## 2020-07-26 DIAGNOSIS — M9902 Segmental and somatic dysfunction of thoracic region: Secondary | ICD-10-CM | POA: Diagnosis not present

## 2020-07-26 DIAGNOSIS — M9901 Segmental and somatic dysfunction of cervical region: Secondary | ICD-10-CM | POA: Diagnosis not present

## 2020-07-28 DIAGNOSIS — M9901 Segmental and somatic dysfunction of cervical region: Secondary | ICD-10-CM | POA: Diagnosis not present

## 2020-07-28 DIAGNOSIS — S233XXA Sprain of ligaments of thoracic spine, initial encounter: Secondary | ICD-10-CM | POA: Diagnosis not present

## 2020-07-28 DIAGNOSIS — M9902 Segmental and somatic dysfunction of thoracic region: Secondary | ICD-10-CM | POA: Diagnosis not present

## 2020-07-28 DIAGNOSIS — M5126 Other intervertebral disc displacement, lumbar region: Secondary | ICD-10-CM | POA: Diagnosis not present

## 2020-07-28 DIAGNOSIS — M47812 Spondylosis without myelopathy or radiculopathy, cervical region: Secondary | ICD-10-CM | POA: Diagnosis not present

## 2020-07-28 DIAGNOSIS — M9903 Segmental and somatic dysfunction of lumbar region: Secondary | ICD-10-CM | POA: Diagnosis not present

## 2020-07-28 DIAGNOSIS — M47816 Spondylosis without myelopathy or radiculopathy, lumbar region: Secondary | ICD-10-CM | POA: Diagnosis not present

## 2020-08-02 DIAGNOSIS — S233XXA Sprain of ligaments of thoracic spine, initial encounter: Secondary | ICD-10-CM | POA: Diagnosis not present

## 2020-08-02 DIAGNOSIS — M47812 Spondylosis without myelopathy or radiculopathy, cervical region: Secondary | ICD-10-CM | POA: Diagnosis not present

## 2020-08-02 DIAGNOSIS — M9903 Segmental and somatic dysfunction of lumbar region: Secondary | ICD-10-CM | POA: Diagnosis not present

## 2020-08-02 DIAGNOSIS — M9901 Segmental and somatic dysfunction of cervical region: Secondary | ICD-10-CM | POA: Diagnosis not present

## 2020-08-02 DIAGNOSIS — M9902 Segmental and somatic dysfunction of thoracic region: Secondary | ICD-10-CM | POA: Diagnosis not present

## 2020-08-02 DIAGNOSIS — M5126 Other intervertebral disc displacement, lumbar region: Secondary | ICD-10-CM | POA: Diagnosis not present

## 2020-08-02 DIAGNOSIS — M47816 Spondylosis without myelopathy or radiculopathy, lumbar region: Secondary | ICD-10-CM | POA: Diagnosis not present

## 2020-08-04 DIAGNOSIS — M47812 Spondylosis without myelopathy or radiculopathy, cervical region: Secondary | ICD-10-CM | POA: Diagnosis not present

## 2020-08-04 DIAGNOSIS — M9901 Segmental and somatic dysfunction of cervical region: Secondary | ICD-10-CM | POA: Diagnosis not present

## 2020-08-04 DIAGNOSIS — S233XXA Sprain of ligaments of thoracic spine, initial encounter: Secondary | ICD-10-CM | POA: Diagnosis not present

## 2020-08-04 DIAGNOSIS — M47816 Spondylosis without myelopathy or radiculopathy, lumbar region: Secondary | ICD-10-CM | POA: Diagnosis not present

## 2020-08-04 DIAGNOSIS — M9903 Segmental and somatic dysfunction of lumbar region: Secondary | ICD-10-CM | POA: Diagnosis not present

## 2020-08-04 DIAGNOSIS — M9902 Segmental and somatic dysfunction of thoracic region: Secondary | ICD-10-CM | POA: Diagnosis not present

## 2020-08-04 DIAGNOSIS — M5126 Other intervertebral disc displacement, lumbar region: Secondary | ICD-10-CM | POA: Diagnosis not present

## 2020-08-06 DIAGNOSIS — I129 Hypertensive chronic kidney disease with stage 1 through stage 4 chronic kidney disease, or unspecified chronic kidney disease: Secondary | ICD-10-CM | POA: Diagnosis not present

## 2020-08-06 DIAGNOSIS — R809 Proteinuria, unspecified: Secondary | ICD-10-CM | POA: Diagnosis not present

## 2020-08-06 DIAGNOSIS — N184 Chronic kidney disease, stage 4 (severe): Secondary | ICD-10-CM | POA: Diagnosis not present

## 2020-08-06 DIAGNOSIS — M1990 Unspecified osteoarthritis, unspecified site: Secondary | ICD-10-CM | POA: Diagnosis not present

## 2020-08-06 DIAGNOSIS — G4733 Obstructive sleep apnea (adult) (pediatric): Secondary | ICD-10-CM | POA: Diagnosis not present

## 2020-08-06 DIAGNOSIS — Z9989 Dependence on other enabling machines and devices: Secondary | ICD-10-CM | POA: Diagnosis not present

## 2020-08-06 DIAGNOSIS — Z8739 Personal history of other diseases of the musculoskeletal system and connective tissue: Secondary | ICD-10-CM | POA: Diagnosis not present

## 2020-08-06 DIAGNOSIS — E669 Obesity, unspecified: Secondary | ICD-10-CM | POA: Diagnosis not present

## 2020-08-06 DIAGNOSIS — D631 Anemia in chronic kidney disease: Secondary | ICD-10-CM | POA: Diagnosis not present

## 2020-08-06 DIAGNOSIS — E1122 Type 2 diabetes mellitus with diabetic chronic kidney disease: Secondary | ICD-10-CM | POA: Diagnosis not present

## 2020-08-06 DIAGNOSIS — Z87442 Personal history of urinary calculi: Secondary | ICD-10-CM | POA: Diagnosis not present

## 2020-08-06 DIAGNOSIS — N2581 Secondary hyperparathyroidism of renal origin: Secondary | ICD-10-CM | POA: Diagnosis not present

## 2020-08-10 DIAGNOSIS — M47812 Spondylosis without myelopathy or radiculopathy, cervical region: Secondary | ICD-10-CM | POA: Diagnosis not present

## 2020-08-10 DIAGNOSIS — M5126 Other intervertebral disc displacement, lumbar region: Secondary | ICD-10-CM | POA: Diagnosis not present

## 2020-08-10 DIAGNOSIS — M9903 Segmental and somatic dysfunction of lumbar region: Secondary | ICD-10-CM | POA: Diagnosis not present

## 2020-08-10 DIAGNOSIS — M47816 Spondylosis without myelopathy or radiculopathy, lumbar region: Secondary | ICD-10-CM | POA: Diagnosis not present

## 2020-08-10 DIAGNOSIS — S233XXA Sprain of ligaments of thoracic spine, initial encounter: Secondary | ICD-10-CM | POA: Diagnosis not present

## 2020-08-10 DIAGNOSIS — M9902 Segmental and somatic dysfunction of thoracic region: Secondary | ICD-10-CM | POA: Diagnosis not present

## 2020-08-10 DIAGNOSIS — M9901 Segmental and somatic dysfunction of cervical region: Secondary | ICD-10-CM | POA: Diagnosis not present

## 2020-08-11 ENCOUNTER — Encounter: Payer: Self-pay | Admitting: Orthopedic Surgery

## 2020-08-11 ENCOUNTER — Other Ambulatory Visit: Payer: Self-pay

## 2020-08-11 ENCOUNTER — Ambulatory Visit (INDEPENDENT_AMBULATORY_CARE_PROVIDER_SITE_OTHER): Payer: Medicare Other | Admitting: Orthopedic Surgery

## 2020-08-11 VITALS — BP 138/74 | HR 59

## 2020-08-11 DIAGNOSIS — M545 Low back pain, unspecified: Secondary | ICD-10-CM

## 2020-08-11 DIAGNOSIS — M48061 Spinal stenosis, lumbar region without neurogenic claudication: Secondary | ICD-10-CM

## 2020-08-11 NOTE — Progress Notes (Signed)
Chief Complaint  Patient presents with  . Back Pain    6 week follow up lumbar pain, some better, he is able to move some more on it.    S/p 2 esi's  He is much better he is walking well he has a week of pain here and there  He is on his Flexeril at night  He is back to the chiropractor  I will see him on an as-needed basis we will hold his third shot till he gets another symptomatic episode

## 2020-08-12 ENCOUNTER — Ambulatory Visit: Payer: Medicare Other

## 2020-08-13 ENCOUNTER — Other Ambulatory Visit: Payer: Self-pay

## 2020-08-13 ENCOUNTER — Ambulatory Visit (INDEPENDENT_AMBULATORY_CARE_PROVIDER_SITE_OTHER): Payer: Medicare Other

## 2020-08-13 VITALS — BP 120/70 | HR 65 | Temp 98.5°F | Resp 16 | Ht 69.0 in | Wt 214.4 lb

## 2020-08-13 DIAGNOSIS — M9902 Segmental and somatic dysfunction of thoracic region: Secondary | ICD-10-CM | POA: Diagnosis not present

## 2020-08-13 DIAGNOSIS — Z23 Encounter for immunization: Secondary | ICD-10-CM | POA: Diagnosis not present

## 2020-08-13 DIAGNOSIS — Z0000 Encounter for general adult medical examination without abnormal findings: Secondary | ICD-10-CM

## 2020-08-13 DIAGNOSIS — M47812 Spondylosis without myelopathy or radiculopathy, cervical region: Secondary | ICD-10-CM | POA: Diagnosis not present

## 2020-08-13 DIAGNOSIS — M47816 Spondylosis without myelopathy or radiculopathy, lumbar region: Secondary | ICD-10-CM | POA: Diagnosis not present

## 2020-08-13 DIAGNOSIS — M9903 Segmental and somatic dysfunction of lumbar region: Secondary | ICD-10-CM | POA: Diagnosis not present

## 2020-08-13 DIAGNOSIS — M5126 Other intervertebral disc displacement, lumbar region: Secondary | ICD-10-CM | POA: Diagnosis not present

## 2020-08-13 DIAGNOSIS — S233XXA Sprain of ligaments of thoracic spine, initial encounter: Secondary | ICD-10-CM | POA: Diagnosis not present

## 2020-08-13 DIAGNOSIS — M9901 Segmental and somatic dysfunction of cervical region: Secondary | ICD-10-CM | POA: Diagnosis not present

## 2020-08-13 NOTE — Patient Instructions (Signed)
[name] , Thank you for taking time to come for your Medicare Wellness Visit. I appreciate your ongoing commitment to your health goals. Please review the following plan we discussed and let me know if I can assist you in the future.   Screening recommendations/referrals: Colonoscopy: Up to date, next due 03/02/2027 Recommended yearly ophthalmology/optometry visit for glaucoma screening and checkup Recommended yearly dental visit for hygiene and checkup  Vaccinations: Influenza vaccine: Up to date, next due fall 2022 Pneumococcal vaccine: Completed series Tdap vaccine: Currently due, you may contact your insurance company to discuss cost or await and injury to receive  Shingles vaccine: Currently due for Shingrix, you may contact your pharmacy to discuss cost or to receive vaccine     Advanced directives: Please bring copies of your medical advanced directives into the office so we can scan into your chart. If you would like to update your advanced directives we do have paperwork in office.  Conditions/risks identified: None   Next appointment: None  Preventive Care 65 Years and Older, Male Preventive care refers to lifestyle choices and visits with your health care provider that can promote health and wellness. What does preventive care include?  A yearly physical exam. This is also called an annual well check.  Dental exams once or twice a year.  Routine eye exams. Ask your health care provider how often you should have your eyes checked.  Personal lifestyle choices, including:  Daily care of your teeth and gums.  Regular physical activity.  Eating a healthy diet.  Avoiding tobacco and drug use.  Limiting alcohol use.  Practicing safe sex.  Taking low doses of aspirin every day.  Taking vitamin and mineral supplements as recommended by your health care provider. What happens during an annual well check? The services and screenings done by your health care provider  during your annual well check will depend on your age, overall health, lifestyle risk factors, and family history of disease. Counseling  Your health care provider may ask you questions about your:  Alcohol use.  Tobacco use.  Drug use.  Emotional well-being.  Home and relationship well-being.  Sexual activity.  Eating habits.  History of falls.  Memory and ability to understand (cognition).  Work and work Statistician. Screening  You may have the following tests or measurements:  Height, weight, and BMI.  Blood pressure.  Lipid and cholesterol levels. These may be checked every 5 years, or more frequently if you are over 50 years old.  Skin check.  Lung cancer screening. You may have this screening every year starting at age 59 if you have a 30-pack-year history of smoking and currently smoke or have quit within the past 15 years.  Fecal occult blood test (FOBT) of the stool. You may have this test every year starting at age 98.  Flexible sigmoidoscopy or colonoscopy. You may have a sigmoidoscopy every 5 years or a colonoscopy every 10 years starting at age 67.  Prostate cancer screening. Recommendations will vary depending on your family history and other risks.  Hepatitis C blood test.  Hepatitis B blood test.  Sexually transmitted disease (STD) testing.  Diabetes screening. This is done by checking your blood sugar (glucose) after you have not eaten for a while (fasting). You may have this done every 1-3 years.  Abdominal aortic aneurysm (AAA) screening. You may need this if you are a current or former smoker.  Osteoporosis. You may be screened starting at age 79 if you are at  high risk. Talk with your health care provider about your test results, treatment options, and if necessary, the need for more tests. Vaccines  Your health care provider may recommend certain vaccines, such as:  Influenza vaccine. This is recommended every year.  Tetanus,  diphtheria, and acellular pertussis (Tdap, Td) vaccine. You may need a Td booster every 10 years.  Zoster vaccine. You may need this after age 64.  Pneumococcal 13-valent conjugate (PCV13) vaccine. One dose is recommended after age 67.  Pneumococcal polysaccharide (PPSV23) vaccine. One dose is recommended after age 15. Talk to your health care provider about which screenings and vaccines you need and how often you need them. This information is not intended to replace advice given to you by your health care provider. Make sure you discuss any questions you have with your health care provider. Document Released: 12/17/2015 Document Revised: 08/09/2016 Document Reviewed: 09/21/2015 Elsevier Interactive Patient Education  2017 [place] Prevention in the Home Falls can cause injuries. They can happen to people of all ages. There are many things you can do to make your home safe and to help prevent falls. What can I do on the outside of my home?  Regularly fix the edges of walkways and driveways and fix any cracks.  Remove anything that might make you trip as you walk through a door, such as a raised step or threshold.  Trim any bushes or trees on the path to your home.  Use bright outdoor lighting.  Clear any walking paths of anything that might make someone trip, such as rocks or tools.  Regularly check to see if handrails are loose or broken. Make sure that both sides of any steps have handrails.  Any raised decks and porches should have guardrails on the edges.  Have any leaves, snow, or ice cleared regularly.  Use sand or salt on walking paths during winter.  Clean up any spills in your garage right away. This includes oil or grease spills. What can I do in the bathroom?  Use night lights.  Install grab bars by the toilet and in the tub and shower. Do not use towel bars as grab bars.  Use non-skid mats or decals in the tub or shower.  If you need to sit down in  the shower, use a plastic, non-slip stool.  Keep the floor dry. Clean up any water that spills on the floor as soon as it happens.  Remove soap buildup in the tub or shower regularly.  Attach bath mats securely with double-sided non-slip rug tape.  Do not have throw rugs and other things on the floor that can make you trip. What can I do in the bedroom?  Use night lights.  Make sure that you have a light by your bed that is easy to reach.  Do not use any sheets or blankets that are too big for your bed. They should not hang down onto the floor.  Have a firm chair that has side arms. You can use this for support while you get dressed.  Do not have throw rugs and other things on the floor that can make you trip. What can I do in the kitchen?  Clean up any spills right away.  Avoid walking on wet floors.  Keep items that you use a lot in easy-to-reach places.  If you need to reach something above you, use a strong step stool that has a grab bar.  Keep electrical cords out of the  way.  Do not use floor polish or wax that makes floors slippery. If you must use wax, use non-skid floor wax.  Do not have throw rugs and other things on the floor that can make you trip. What can I do with my stairs?  Do not leave any items on the stairs.  Make sure that there are handrails on both sides of the stairs and use them. Fix handrails that are broken or loose. Make sure that handrails are as long as the stairways.  Check any carpeting to make sure that it is firmly attached to the stairs. Fix any carpet that is loose or worn.  Avoid having throw rugs at the top or bottom of the stairs. If you do have throw rugs, attach them to the floor with carpet tape.  Make sure that you have a light switch at the top of the stairs and the bottom of the stairs. If you do not have them, ask someone to add them for you. What else can I do to help prevent falls?  Wear shoes that:  Do not have high  heels.  Have rubber bottoms.  Are comfortable and fit you well.  Are closed at the toe. Do not wear sandals.  If you use a stepladder:  Make sure that it is fully opened. Do not climb a closed stepladder.  Make sure that both sides of the stepladder are locked into place.  Ask someone to hold it for you, if possible.  Clearly mark and make sure that you can see:  Any grab bars or handrails.  First and last steps.  Where the edge of each step is.  Use tools that help you move around (mobility aids) if they are needed. These include:  Canes.  Walkers.  Scooters.  Crutches.  Turn on the lights when you go into a dark area. Replace any light bulbs as soon as they burn out.  Set up your furniture so you have a clear path. Avoid moving your furniture around.  If any of your floors are uneven, fix them.  If there are any pets around you, be aware of where they are.  Review your medicines with your doctor. Some medicines can make you feel dizzy. This can increase your chance of falling. Ask your doctor what other things that you can do to help prevent falls. This information is not intended to replace advice given to you by your health care provider. Make sure you discuss any questions you have with your health care provider. Document Released: 09/16/2009 Document Revised: 04/27/2016 Document Reviewed: 12/25/2014 Elsevier Interactive Patient Education  2017 [employer].

## 2020-08-13 NOTE — Progress Notes (Signed)
Subjective:   Bruce Weber. is a 69 y.o. male who presents for Medicare Annual/Subsequent preventive examination.  Review of Systems    N/A Cardiac Risk Factors include: advanced age (>59men, >31 women);male gender;hypertension;dyslipidemia     Objective:    Today's Vitals   08/13/20 1319  BP: 120/70  Pulse: 65  Resp: 16  Temp: 98.5 F (36.9 C)  TempSrc: Oral  SpO2: 95%  Weight: 214 lb 6 oz (97.2 kg)  Height: 5\' 9"  (1.753 m)   Body mass index is 31.66 kg/m.  Advanced Directives 08/13/2020 03/23/2020 01/27/2020 02/06/2019 03/01/2017 11/25/2012  Does Patient Have a Medical Advance Directive? Yes No Yes No Yes Patient has advance directive, copy not in chart  Type of Advance Directive [place];Living will - Living will - [place];Living will Living will  Does patient want to make changes to medical advance directive? No - Patient declined - Yes (ED - Information included in AVS) - - -  Copy of [place] in Chart? No - copy requested - - - No - copy requested -  Would patient like information on creating a medical advance directive? - - - No - Patient declined - -    Current Medications (verified) Outpatient Encounter Medications as of 08/13/2020  Medication Sig  . acetaminophen (TYLENOL) 500 MG tablet Take 1,000 mg by mouth 2 (two) times daily as needed for headache.  . allopurinol (ZYLOPRIM) 100 MG tablet Take 300 mg by mouth daily.   Marland Kitchen amLODipine (NORVASC) 10 MG tablet Take 10 mg by mouth daily.  Marland Kitchen atorvastatin (LIPITOR) 10 MG tablet Take 1 tablet (10 mg total) by mouth daily.  . calcitRIOL (ROCALTROL) 0.25 MCG capsule Take 0.25 mcg by mouth at bedtime.   . clomiPHENE (CLOMID) 50 MG tablet Take 25 mg by mouth at bedtime.   . cyclobenzaprine (FLEXERIL) 5 MG tablet TAKE 1 TABLET BY MOUTH AT BEDTIME AS NEEDED FOR MUSCLE SPASMS.  Marland Kitchen diclofenac Sodium (VOLTAREN) 1 % GEL Apply 2 g topically 4 (four) times daily.  .  famciclovir (FAMVIR) 500 MG tablet Take 1,000 mg by mouth 2 (two) times daily as needed (fever blisters).   . famotidine (PEPCID) 40 MG tablet TAKE 1 TABLET (40 MG TOTAL) BY MOUTH DAILY. WITH BREAKFAST  . felodipine (PLENDIL) 10 MG 24 hr tablet Take 10 mg by mouth daily.  . fluticasone (FLONASE) 50 MCG/ACT nasal spray Place 1 spray into both nostrils daily as needed for allergies or rhinitis.  . furosemide (LASIX) 40 MG tablet Take 40 mg by mouth.  . gabapentin (NEURONTIN) 100 MG capsule TAKE 1 CAPSULE BY MOUTH THREE TIMES A DAY  . hydrALAZINE (APRESOLINE) 25 MG tablet Take 25 mg by mouth 3 (three) times daily.  Marland Kitchen HYDROcodone-acetaminophen (NORCO/VICODIN) 5-325 MG tablet Take 1 tablet by mouth every 6 (six) hours as needed.  . propranolol (INDERAL) 80 MG tablet TAKE 1 TABLET (80 MG TOTAL) BY MOUTH 2 (TWO) TIMES DAILY.  Marland Kitchen psyllium (METAMUCIL) 58.6 % powder Take 1 packet by mouth daily. As needed for constipation  . tamsulosin (FLOMAX) 0.4 MG CAPS capsule Take 0.4 mg by mouth daily.  . traZODone (DESYREL) 50 MG tablet TAKE 1 TABLET BY MOUTH EVERYDAY AT BEDTIME  . triamcinolone (KENALOG) 0.025 % ointment Apply 1 application topically 2 (two) times daily.  . vitamin C (ASCORBIC ACID) 500 MG tablet Take 500 mg by mouth 2 (two) times a week.    No facility-administered encounter medications  on file as of 08/13/2020.    Allergies (verified) Dye fdc blue [brilliant blue fcf (fd&c blue #1)] and Iodinated diagnostic agents   History: Past Medical History:  Diagnosis Date  . Adenoma 11/2009   1.1 cm from TCS  . DM (diabetes mellitus) ([place])   . GERD (gastroesophageal reflux disease)   . High cholesterol   . HTN (hypertension)   . Kidney stones   . Obesity (BMI 30-39.9) DEC 2011 223 LBS  . Rectal bleeding 2011   secondary to hemorrhoids   Past Surgical History:  Procedure Laterality Date  . CIRCUMCISION    . COLONOSCOPY  Dec 2010 [facility]   MOD IH, SIMPLE ADENOMA 1.1 CM  . COLONOSCOPY   11/25/2012   SLF: 1. Sessile polyp measuring 39mm in size was found at the hepatic flesure; polypectomy was performed using snare cautery 2. Moderate diverticulosis was noted in the ascending colon and sigmoid colon. 3. The colon mucosa was otherwise normal 4. Large internal hemorrhoids.   . COLONOSCOPY N/A 03/01/2017   Procedure: COLONOSCOPY;  Surgeon: Danie Binder, MD;  [facility]: [facility];  Service: Endoscopy;  Laterality: N/A;  10:30 AM  . EYE SURGERY  05/2018   bilateral cataract surgery  . HERNIA REPAIR     Family History  Problem Relation Age of Onset  . Lung cancer Father   . High blood pressure Father   . Colon cancer Neg Hx   . Colon polyps Neg Hx    Social History   Socioeconomic History  . Marital status: Married    Spouse name: Not on file  . Number of children: 0  . Years of education: Not on file  . Highest education level: Not on file  Occupational History  . 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 Smoking status: Never Smoker  . Smokeless tobacco: Never Used  . Tobacco comment: Never smoked  Vaping Use  . Vaping Use: Never used  Substance and Sexual Activity  . Alcohol use: No  . Drug use: No  . Sexual activity: Never  Other Topics Concern  . Not on file  Social History Narrative   NO KIDS. MARRIED TO 2ND WIFE.      02/06/2019: Lives with mother at this time assisting in her care, while wife lives nearby, taking care of her own parents.   Works from home full time, but planning on retiring in next month to focus on health/well-being   Used to work out on treadmill, but has not in recent months   Motivated to return to exercise regimine and better diet control   Enjoys watching basketball, football      Social Determinants of Health   Financial Resource Strain: Low Risk   . Difficulty of Paying  Living Expenses: Not hard at all  Food Insecurity: No Food Insecurity  . Worried About Charity fundraiser in the Last Year: Never true  . Ran Out of Food in the Last Year: Never true  Transportation Needs: No Transportation Needs  . Lack of Transportation (Medical): No  . Lack of Transportation (Non-Medical): No  Physical Activity: Inactive  . Days of Exercise per Week: 0 days  . Minutes of Exercise per Session: 0 min  Stress: No Stress Concern Present  . Feeling of Stress : Not at  all  Social Connections: Socially Integrated  . Frequency of Communication with Friends and Family: More than three times a week  . Frequency of Social Gatherings with Friends and Family: More than three times a week  . Attends Religious Services: More than 4 times per year  . Active Member of Clubs or Organizations: Yes  . Attends Archivist Meetings: More than 4 times per year  . Marital Status: Married    Tobacco Counseling Counseling given: Not Answered Comment: Never smoked   Clinical Intake:  Pre-visit preparation completed: Yes  Pain : No/denies pain     Nutritional Status: BMI > 30  Obese Nutritional Risks: None Diabetes: No  How often do you need to have someone help you when you read instructions, pamphlets, or other written materials from your doctor or pharmacy?: 1 - Never What is the last grade level you completed in school?: some college  Diabetic?No  Interpreter Needed?: No  Information entered by :: [place] of Daily Living In your present state of health, do you have any difficulty performing the following activities: 08/13/2020  Hearing? N  Vision? N  Difficulty concentrating or making decisions? N  Walking or climbing stairs? Y  Comment States has issues with back pain when climbing stairs  Dressing or bathing? N  Doing errands, shopping? N  Preparing Food and eating ? N  Using the Toilet? N  In the past six months, have you accidently  leaked urine? N  Do you have problems with loss of bowel control? N  Managing your Medications? N  Managing your Finances? N  Housekeeping or managing your Housekeeping? N  Some recent data might be hidden    Patient Care Team: Billie Ruddy, MD as PCP - General (Family Medicine) Danie Binder, MD (Inactive) (Gastroenterology) Deterding, Jeneen Rinks, MD as Consulting Physician (Nephrology) Rutherford Guys, MD as Consulting Physician (Ophthalmology) Deneise Lever, MD as Consulting Physician (Pulmonary Disease)  Indicate any recent Medical Services you may have received from other than [facility] providers in the past year (date may be approximate).     Assessment:   This is a routine wellness examination for [place].  Hearing/Vision screen  Hearing Screening   125Hz  250Hz  500Hz  1000Hz  2000Hz  3000Hz  4000Hz  6000Hz  8000Hz   Right ear:           Left ear:           Vision Screening Comments: Patient states gets eye checked yearly    Dietary issues and exercise activities discussed: Current Exercise Habits: The patient does not participate in regular exercise at present  Goals    . Patient Stated     Start going to [facility], retire ASAP! Return to 180lb. Weight by next year!    . Patient Stated     I would like to volunteer somewhere since I've retired      Depression Screen PHQ 2/9 Scores 08/13/2020 02/06/2019 09/11/2018 06/17/2018 02/20/2018 11/28/2017  PHQ - 2 Score 0 0 0 0 0 0  PHQ- 9 Score 0 2 - - - -    Fall Risk Fall Risk  08/13/2020 02/06/2019 09/11/2018 06/17/2018 02/20/2018  Falls in the past year? 1 0 No No No  Number falls in past yr: 0 - - - -  Injury with Fall? 0 - - - -  Risk for fall due to : History of fall(s);Medication side effect Medication side effect - - -  Follow up Falls evaluation completed;Falls prevention discussed - - - -  Any stairs in or around the home? Yes  If so, are there any without handrails? No  Home free of loose throw rugs in walkways, pet beds,  electrical cords, etc? Yes  Adequate lighting in your home to reduce risk of falls? Yes   ASSISTIVE DEVICES UTILIZED TO PREVENT FALLS:  Life alert? No  Use of a cane, walker or w/c? No  Grab bars in the bathroom? No  Shower chair or bench in shower? No  Elevated toilet seat or a handicapped toilet? No   TIMED UP AND GO:  Was the test performed? Yes .  Length of time to ambulate 10 feet: 8 sec.   Gait steady and fast without use of assistive device  Cognitive Function:    Cognition intact based on direct observation     Immunizations Immunization History  Administered Date(s) Administered  . Fluad Quad(high Dose 65+) 08/13/2020  . Influenza Split 08/04/2012, 08/04/2014, 09/01/2015  . Influenza Whole 08/05/2013  . Influenza-Unspecified 08/18/2018  . PFIZER SARS-COV-2 Vaccination 01/29/2020, 02/25/2020  . Pneumococcal Conjugate-13 02/06/2019  . Pneumococcal Polysaccharide-23 08/13/2020  . Zoster 08/05/2013    TDAP status: Due, Education has been provided regarding the importance of this vaccine. Advised may receive this vaccine at local pharmacy or Health Dept. Aware to provide a copy of the vaccination record if obtained from local pharmacy or Health Dept. Verbalized acceptance and understanding. Flu Vaccine status: Completed at today's visit Pneumococcal vaccine status: Completed during today's visit. Covid-19 vaccine status: Completed vaccines  Qualifies for Shingles Vaccine? Yes   Zostavax completed Yes   Shingrix Completed?: No.    Education has been provided regarding the importance of this vaccine. Patient has been advised to call insurance company to determine out of pocket expense if they have not yet received this vaccine. Advised may also receive vaccine at local pharmacy or Health Dept. Verbalized acceptance and understanding.  Screening Tests Health Maintenance  Topic Date Due  . HEMOGLOBIN A1C  Never done  . Hepatitis C Screening  Never done  .  TETANUS/TDAP  Never done  . OPHTHALMOLOGY EXAM  07/05/2019  . FOOT EXAM  08/05/2019  . COLONOSCOPY  03/02/2027  . INFLUENZA VACCINE  Completed  . COVID-19 Vaccine  Completed  . PNA vac Low Risk Adult  Completed    Health Maintenance  Health Maintenance Due  Topic Date Due  . HEMOGLOBIN A1C  Never done  . Hepatitis C Screening  Never done  . TETANUS/TDAP  Never done  . OPHTHALMOLOGY EXAM  07/05/2019  . FOOT EXAM  08/05/2019    Colorectal cancer screening: Completed 03/01/2017. Repeat every 10 years  Lung Cancer Screening: (Low Dose CT Chest recommended if Age 45-80 years, 30 pack-year currently smoking OR have quit w/in 15years.) does not qualify.   Lung Cancer Screening Referral: N/A  Additional Screening:  Hepatitis C Screening: does qualify;   Vision Screening: Recommended annual ophthalmology exams for early detection of glaucoma and other disorders of the eye. Is the patient up to date with their annual eye exam?  Yes  Who is the provider or what is the name of the office in which the patient attends annual eye exams? Dr. Gershon Crane  If pt is not established with a provider, would they like to be referred to a provider to establish care? No .   Dental Screening: Recommended annual dental exams for proper oral hygiene  Community Resource Referral / Chronic Care Management: CRR required this visit?  No   CCM required  this visit?  No      Plan:     I have personally reviewed and noted the following in the patient's chart:   . Medical and social history . Use of alcohol, tobacco or illicit drugs  . Current medications and supplements . Functional ability and status . Nutritional status . Physical activity . Advanced directives . List of other physicians . Hospitalizations, surgeries, and ER visits in previous 12 months . Vitals . Screenings to include cognitive, depression, and falls . Referrals and appointments  In addition, I have reviewed and discussed  with patient certain preventive protocols, quality metrics, and best practice recommendations. A written personalized care plan for preventive services as well as general preventive health recommendations were provided to patient.     Ofilia Neas, LPN   6/31/4970   Nurse Notes: None

## 2020-08-17 DIAGNOSIS — S233XXA Sprain of ligaments of thoracic spine, initial encounter: Secondary | ICD-10-CM | POA: Diagnosis not present

## 2020-08-17 DIAGNOSIS — M5126 Other intervertebral disc displacement, lumbar region: Secondary | ICD-10-CM | POA: Diagnosis not present

## 2020-08-17 DIAGNOSIS — M9902 Segmental and somatic dysfunction of thoracic region: Secondary | ICD-10-CM | POA: Diagnosis not present

## 2020-08-17 DIAGNOSIS — M47816 Spondylosis without myelopathy or radiculopathy, lumbar region: Secondary | ICD-10-CM | POA: Diagnosis not present

## 2020-08-17 DIAGNOSIS — M9901 Segmental and somatic dysfunction of cervical region: Secondary | ICD-10-CM | POA: Diagnosis not present

## 2020-08-17 DIAGNOSIS — M47812 Spondylosis without myelopathy or radiculopathy, cervical region: Secondary | ICD-10-CM | POA: Diagnosis not present

## 2020-08-17 DIAGNOSIS — M9903 Segmental and somatic dysfunction of lumbar region: Secondary | ICD-10-CM | POA: Diagnosis not present

## 2020-08-23 DIAGNOSIS — M9902 Segmental and somatic dysfunction of thoracic region: Secondary | ICD-10-CM | POA: Diagnosis not present

## 2020-08-23 DIAGNOSIS — M5126 Other intervertebral disc displacement, lumbar region: Secondary | ICD-10-CM | POA: Diagnosis not present

## 2020-08-23 DIAGNOSIS — M9903 Segmental and somatic dysfunction of lumbar region: Secondary | ICD-10-CM | POA: Diagnosis not present

## 2020-08-23 DIAGNOSIS — M47816 Spondylosis without myelopathy or radiculopathy, lumbar region: Secondary | ICD-10-CM | POA: Diagnosis not present

## 2020-08-23 DIAGNOSIS — M47812 Spondylosis without myelopathy or radiculopathy, cervical region: Secondary | ICD-10-CM | POA: Diagnosis not present

## 2020-08-23 DIAGNOSIS — M9901 Segmental and somatic dysfunction of cervical region: Secondary | ICD-10-CM | POA: Diagnosis not present

## 2020-08-23 DIAGNOSIS — S233XXA Sprain of ligaments of thoracic spine, initial encounter: Secondary | ICD-10-CM | POA: Diagnosis not present

## 2020-08-26 DIAGNOSIS — M9903 Segmental and somatic dysfunction of lumbar region: Secondary | ICD-10-CM | POA: Diagnosis not present

## 2020-08-26 DIAGNOSIS — M47812 Spondylosis without myelopathy or radiculopathy, cervical region: Secondary | ICD-10-CM | POA: Diagnosis not present

## 2020-08-26 DIAGNOSIS — M9902 Segmental and somatic dysfunction of thoracic region: Secondary | ICD-10-CM | POA: Diagnosis not present

## 2020-08-26 DIAGNOSIS — M47816 Spondylosis without myelopathy or radiculopathy, lumbar region: Secondary | ICD-10-CM | POA: Diagnosis not present

## 2020-08-26 DIAGNOSIS — M5126 Other intervertebral disc displacement, lumbar region: Secondary | ICD-10-CM | POA: Diagnosis not present

## 2020-08-26 DIAGNOSIS — S233XXA Sprain of ligaments of thoracic spine, initial encounter: Secondary | ICD-10-CM | POA: Diagnosis not present

## 2020-08-26 DIAGNOSIS — M9901 Segmental and somatic dysfunction of cervical region: Secondary | ICD-10-CM | POA: Diagnosis not present

## 2020-08-30 DIAGNOSIS — M9901 Segmental and somatic dysfunction of cervical region: Secondary | ICD-10-CM | POA: Diagnosis not present

## 2020-08-30 DIAGNOSIS — M47816 Spondylosis without myelopathy or radiculopathy, lumbar region: Secondary | ICD-10-CM | POA: Diagnosis not present

## 2020-08-30 DIAGNOSIS — M9903 Segmental and somatic dysfunction of lumbar region: Secondary | ICD-10-CM | POA: Diagnosis not present

## 2020-08-30 DIAGNOSIS — M47812 Spondylosis without myelopathy or radiculopathy, cervical region: Secondary | ICD-10-CM | POA: Diagnosis not present

## 2020-08-30 DIAGNOSIS — S233XXA Sprain of ligaments of thoracic spine, initial encounter: Secondary | ICD-10-CM | POA: Diagnosis not present

## 2020-08-30 DIAGNOSIS — M5126 Other intervertebral disc displacement, lumbar region: Secondary | ICD-10-CM | POA: Diagnosis not present

## 2020-08-30 DIAGNOSIS — M9902 Segmental and somatic dysfunction of thoracic region: Secondary | ICD-10-CM | POA: Diagnosis not present

## 2020-09-02 ENCOUNTER — Ambulatory Visit (INDEPENDENT_AMBULATORY_CARE_PROVIDER_SITE_OTHER): Payer: Medicare Other | Admitting: Family Medicine

## 2020-09-02 ENCOUNTER — Other Ambulatory Visit: Payer: Self-pay

## 2020-09-02 ENCOUNTER — Encounter: Payer: Self-pay | Admitting: Family Medicine

## 2020-09-02 ENCOUNTER — Ambulatory Visit (INDEPENDENT_AMBULATORY_CARE_PROVIDER_SITE_OTHER): Payer: Medicare Other

## 2020-09-02 VITALS — BP 132/76 | HR 58 | Temp 98.8°F | Wt 211.0 lb

## 2020-09-02 DIAGNOSIS — I10 Essential (primary) hypertension: Secondary | ICD-10-CM | POA: Diagnosis not present

## 2020-09-02 DIAGNOSIS — M545 Low back pain: Secondary | ICD-10-CM | POA: Diagnosis not present

## 2020-09-02 DIAGNOSIS — G8929 Other chronic pain: Secondary | ICD-10-CM

## 2020-09-02 DIAGNOSIS — E119 Type 2 diabetes mellitus without complications: Secondary | ICD-10-CM

## 2020-09-02 DIAGNOSIS — N184 Chronic kidney disease, stage 4 (severe): Secondary | ICD-10-CM

## 2020-09-02 DIAGNOSIS — R0602 Shortness of breath: Secondary | ICD-10-CM | POA: Diagnosis not present

## 2020-09-02 DIAGNOSIS — M5134 Other intervertebral disc degeneration, thoracic region: Secondary | ICD-10-CM | POA: Diagnosis not present

## 2020-09-02 NOTE — Progress Notes (Signed)
Subjective:    Patient ID: Noel Gerold., male    DOB: May 31, 1951, 69 y.o.   MRN: [id number]  No chief complaint on file.   HPI Pt is a 69 yo male with pmh sig for OSA, seasonal allergies, GERD, h/o colon polyps, lumbar stenosis, renal calculi, HLD, insomnia, HTN, CKD 4 who was seen today for f/u.  Pt seen by Nephrology, Dr. Jimmy Footman for CKD 4.  Pt states he is undergoing preliminary steps to be considered for the renal transplant list as kidney function is decreasing.  Pt states he is hoping he can do home peritoneal dialysis if needed.  Trying to stay active, but notes SOB at times. Denies CP, calf pain, palpitations.  Blood sugar controlled by diet.  Typically 108, 113, 130 at the highest.  Pt notes being followed by Ortho and PT for history of bulging disc in back.  Needs refill on Flexeril for occasional spasms and Kenalog ointment.  Pt considering going back to work part time.  Still taking care of his mother and her home.  Pt enjoys watching high school and college sports.  Going to a [employer] this evening.  Pt also likes history of historically black High schools in [place].  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  Past Medical History:  Diagnosis Date  . Adenoma 11/2009   1.1 cm from TCS  . DM (diabetes mellitus) ([place])   . GERD (gastroesophageal reflux disease)   . High cholesterol   . HTN (hypertension)   . Kidney stones   . Obesity (BMI 30-39.9) DEC 2011 223 LBS  . Rectal bleeding 2011   secondary to hemorrhoids    Allergies  Allergen Reactions  . Dye Fdc Blue [Brilliant Blue Fcf (Fd&C Blue #1)]     PT UNSURE WHICH DYE  . Iodinated Diagnostic Agents Hives    30 YEARS AGO, recent 2021 epidural with contrast ok    ROS General: Denies fever, chills, night sweats, changes in weight, changes in appetite HEENT: Denies headaches, ear pain, changes in vision, rhinorrhea, sore throat CV: Denies CP, palpitations, orthopnea   +SOB Pulm: Denies cough, wheezing  +SOB GI: Denies abdominal pain, nausea, vomiting, diarrhea, constipation GU: Denies dysuria, hematuria, frequency, vaginal discharge Msk: Denies muscle cramps, joint pains  +intermittent back pain Neuro: Denies weakness, numbness, tingling Skin: Denies rashes, bruising Psych: Denies depression, anxiety, hallucinations    Objective:    Blood pressure 132/76, pulse (!) 58, temperature 98.8 F (37.1 C), temperature source Oral, weight 211 lb (95.7 kg), SpO2 97 %.  Gen. Pleasant, well-nourished, in no distress, normal affect  HEENT: [place]/AT, face symmetric, conjunctiva clear, no scleral icterus, PERRLA, EOMI, nares patent without drainage, pharynx without erythema or exudate. Neck: No JVD, no thyromegaly, no carotid bruits Lungs: no accessory muscle use, CTAB, no wheezes or rales Cardiovascular: RRR, no m/r/g, no peripheral edema Musculoskeletal: No deformities, no cyanosis or clubbing, normal tone Neuro:  A&Ox3, CN II-XII intact, normal gait Skin:  Warm, no lesions/ rash   Wt Readings from Last 3 Encounters:  08/13/20 214 lb 6 oz (97.2 kg)  06/30/20 (!) 210 lb (95.3 kg)  06/15/20 211 lb 3.2 oz (95.8 kg)    Lab Results  Component Value Date   WBC 11.5 (H) 01/27/2019   HGB 13.5 01/27/2019   HCT 40.9 01/27/2019   PLT 224.0 01/27/2019   GLUCOSE 110 (H) 01/27/2019   CHOL 260 (H) 12/10/2019   TRIG 146.0  12/10/2019   HDL 51.50 12/10/2019   LDLCALC 179 (H) 12/10/2019   ALT 10 04/09/2018   AST 13 (A) 04/09/2018   NA 144 01/27/2019   K 4.6 01/27/2019   CL 108 01/27/2019   CREATININE 3.08 (H) 01/27/2019   BUN 34 (H) 01/27/2019   CO2 25 01/27/2019   PSA 1.41 01/27/2019    Assessment/Plan:  SOB (shortness of breath) on exertion  -Discussed possible causes including arrhythmia, CHF, CAD, reactive airway dz, or infection -EKG obtained this visit with sinus bradycardia, small biphasic P waves in V1.  No ST elevation or depression noted.  No prior  studies for comparison.  Consider left atrial abnormality. - Plan: EKG 12-Lead, CBC with Differential/Platelet, Brain Natriuretic Peptide, DG Chest 2 View, Brain Natriuretic Peptide, CBC with Differential/Platelet, ECHO  CKD (chronic kidney disease) stage 4, GFR 15-29 ml/min (HCC)  -Continue follow-up with nephrology -Avoid nephrotoxic meds and renally dose medications -Patient advised to consider renal transplant list - Plan: CMP with eGFR([facility]), CMP with eGFR([facility])  Chronic midline low back pain without sciatica -Continue follow-up with Ortho and PT - Plan: cyclobenzaprine (FLEXERIL) 5 MG tablet  Diet-controlled diabetes mellitus (HCC)  -Pain lifestyle modifications -Continue checking FSBS regularly -Continue follow-up with Dr. Rutherford Guys, ophthalmology - Plan: Hemoglobin A1c, Hemoglobin A1c  Essential hypertension  -Controlled -Continue current medications including Norvasc 10 mg, felodipine 10 mg, Lasix 40 mg, hydralazine 25 mg 3 times daily, propranolol 80 mg twice daily -Continue lifestyle modifications -Encouraged to check BP at home - Plan: CMP with eGFR([facility]), Lipid panel, CMP with eGFR([facility]), Lipid panel  F/u in 1 month, sooner if needed.  Grier Mitts, MD

## 2020-09-02 NOTE — Patient Instructions (Signed)
Shortness of Breath, Adult Shortness of breath is when a person has trouble breathing enough air or when a person feels like she or he is having trouble breathing in enough air. Shortness of breath could be a sign of a medical problem. Follow these instructions at home:   Pay attention to any changes in your symptoms.  Do not use any products that contain nicotine or tobacco, such as cigarettes, e-cigarettes, and chewing tobacco.  Do not smoke. Smoking is a common cause of shortness of breath. If you need help quitting, ask your health care provider.  Avoid things that can irritate your airways, such as: ? Mold. ? Dust. ? Air pollution. ? Chemical fumes. ? Things that can cause allergy symptoms (allergens), if you have allergies.  Keep your living space clean and free of mold and dust.  Rest as needed. Slowly return to your usual activities.  Take over-the-counter and prescription medicines only as told by your health care provider. This includes oxygen therapy and inhaled medicines.  Keep all follow-up visits as told by your health care provider. This is important. Contact a health care provider if:  Your condition does not improve as soon as expected.  You have a hard time doing your normal activities, even after you rest.  You have new symptoms. Get help right away if:  Your shortness of breath gets worse.  You have shortness of breath when you are resting.  You feel light-headed or you faint.  You have a cough that is not controlled with medicines.  You cough up blood.  You have pain with breathing.  You have pain in your chest, arms, shoulders, or abdomen.  You have a fever.  You cannot walk up stairs or exercise the way that you normally do. These symptoms may represent a serious problem that is an emergency. Do not wait to see if the symptoms will go away. Get medical help right away. Call your local emergency services (911 in the [place] Do not drive yourself  to the hospital. Summary  Shortness of breath is when a person has trouble breathing enough air. It can be a sign of a medical problem.  Avoid things that irritate your lungs, such as smoking, pollution, mold, and dust.  Pay attention to changes in your symptoms and contact your health care provider if you have a hard time completing daily activities because of shortness of breath. This information is not intended to replace advice given to you by your health care provider. Make sure you discuss any questions you have with your health care provider. Document Revised: 04/22/2018 Document Reviewed: 04/22/2018 Elsevier Patient Education  [place].  Kidney Transplant, Adult A kidney transplant is a surgery to receive a kidney from another person (donor kidney). A kidney transplant may be done if there is no other treatment that can make a kidney healthy. This procedure is a major surgery. A team of health care providers (transplant team) is needed to help the person prepare for the procedure and to help with recovery. A donor kidney may come from a living person, or it may come from someone who has died if he or she was an organ donor. There may be a waiting list for the donation, and treatment with an artificial kidney (dialysis) may be needed during this time. Tell a health care provider about:  Any allergies you have.  All medicines you are taking, including vitamins, herbs, eye drops, creams, and over-the-counter medicines.  Any problems you  or family members have had with anesthetic medicines.  Any medical conditions you have.  Any surgeries you have had.  Any blood disorders you have.  Whether you are pregnant or may be pregnant. What are the risks? This is a major surgery. Problems may occur.  Possible short-term problems include: ? Infection. ? Bleeding. ? Damage to nearby structures or organs. ? Your body's defense system (immune system) attacking the new kidney.  This is called rejection. ? Urine blockage or leaking where the new kidney attaches to the bladder.  Possible long-term problems include: ? High blood pressure. ? Blood clots in the blood vessels of the new kidney. ? Narrowing of the blood vessels of the new kidney. ? Emotional problems and stress. These may be caused by the long recovery and the need to limit contact with other people after the procedure. ? Problems caused by medicines, such as an allergic reaction or a higher risk of infections, diabetes, certain cancers, and bone thinning (osteoporosis). What happens before the procedure? Staying hydrated Follow instructions from your health care provider about hydration, which may include:  Up to 2 hours before the procedure - you may continue to drink clear liquids, such as water, clear fruit juice, black coffee, and plain tea.  Eating and drinking restrictions Follow instructions from your health care provider about eating and drinking, which may include:  8 hours before the procedure - stop eating heavy meals or foods, such as meat, fried foods, or fatty foods.  6 hours before the procedure - stop eating light meals or foods, such as toast or cereal.  6 hours before the procedure - stop drinking milk or drinks that contain milk.  2 hours before the procedure - stop drinking clear liquids. Medicines Ask your health care provider about:  Changing or stopping your regular medicines. This is especially important if you are taking diabetes medicines or blood thinners.  Taking medicines such as aspirin and ibuprofen. These medicines can thin your blood. Do not take these medicines unless your health care provider tells you to take them.  Taking over-the-counter medicines, vitamins, herbs, and supplements. General instructions  You will have many tests, including blood tests and imaging tests.  Do not use any products that contain nicotine or tobacco for at least 4 weeks before  the procedure. These products include cigarettes, e-cigarettes, and chewing tobacco. These can affect how medicines work and may delay healing. If you need help quitting, ask your health care provider.  Ask your health care provider: ? How your surgery site will be marked. ? What steps will be taken to help prevent infection. These may include:  Removing hair at the surgery site.  Washing skin with a germ-killing soap.  Taking antibiotic medicine.  Plan to have someone take you home from the hospital or clinic.  You may need to plan for a long recovery time with limited activity. You may need psychological and emotional counseling to prepare for the procedure and long recovery. What happens during the procedure?   An IV will be inserted into one of your veins.  You will be given one or more of the following: ? A medicine to help you relax (sedative). ? A medicine to make you fall asleep (general anesthetic).  A small, thin tube will be inserted into your bladder (urinary catheter) to drain urine during and after surgery.  An incision will be made in your lower abdomen.  The donor kidney will be placed in your abdomen.  The blood vessels of the new kidney will be sewn onto blood vessels inside the abdomen.  The part of the body that drains urine from the new kidney to the bladder (ureter) will be sewn into the bladder.  Usually, your old kidneys will be left in their normal position. The old kidneys may be removed if they are enlarged due to cysts or if they cause problems, such as high blood pressure or infections.  A tube may be placed near the new kidney to drain extra fluids from the surgical area (surgical drain).  Your incision will be closed with stitches (sutures) or staples.  Your incision will be covered with a bandage (dressing). The procedure may vary among health care providers and hospitals. What happens after the procedure?  Your blood pressure, heart rate,  breathing rate, and blood oxygen level will be monitored until you leave the hospital or clinic.  You may continue to have an IV providing nutrition and fluids, a urinary catheter, and surgical drains.  Your IV may be removed after a few days, when you can start eating a normal diet.  You will be given medicines that help to relieve pain and prevent rejection of the new kidney.  You may have to wear compression stockings. These stockings help to prevent blood clots and reduce swelling in the legs. Summary  Kidney transplant is a major surgery. A transplant team will help you prepare for the procedure and help you recover.  Problems may occur, including your body's immune system attacking the new kidney. This is called rejection.  Right before the procedure, you will be given medicine to make you fall asleep (general anesthetic).  After the procedure, you will be given medicines for relieving pain and preventing rejection of the new kidney. This information is not intended to replace advice given to you by your health care provider. Make sure you discuss any questions you have with your health care provider. Document Revised: 06/10/2019 Document Reviewed: 06/10/2019 Elsevier Patient Education  [place].

## 2020-09-03 ENCOUNTER — Encounter (HOSPITAL_COMMUNITY): Payer: Self-pay | Admitting: Physical Therapy

## 2020-09-03 DIAGNOSIS — M47812 Spondylosis without myelopathy or radiculopathy, cervical region: Secondary | ICD-10-CM | POA: Diagnosis not present

## 2020-09-03 DIAGNOSIS — S233XXA Sprain of ligaments of thoracic spine, initial encounter: Secondary | ICD-10-CM | POA: Diagnosis not present

## 2020-09-03 DIAGNOSIS — M9901 Segmental and somatic dysfunction of cervical region: Secondary | ICD-10-CM | POA: Diagnosis not present

## 2020-09-03 DIAGNOSIS — M9902 Segmental and somatic dysfunction of thoracic region: Secondary | ICD-10-CM | POA: Diagnosis not present

## 2020-09-03 DIAGNOSIS — M47816 Spondylosis without myelopathy or radiculopathy, lumbar region: Secondary | ICD-10-CM | POA: Diagnosis not present

## 2020-09-03 DIAGNOSIS — M5126 Other intervertebral disc displacement, lumbar region: Secondary | ICD-10-CM | POA: Diagnosis not present

## 2020-09-03 DIAGNOSIS — M9903 Segmental and somatic dysfunction of lumbar region: Secondary | ICD-10-CM | POA: Diagnosis not present

## 2020-09-03 LAB — COMPLETE METABOLIC PANEL WITHOUT GFR
AG Ratio: 1.4 (calc) (ref 1.0–2.5)
ALT: 5 U/L — ABNORMAL LOW (ref 9–46)
AST: 10 U/L (ref 10–35)
Albumin: 4.1 g/dL (ref 3.6–5.1)
Alkaline phosphatase (APISO): 72 U/L (ref 35–144)
BUN/Creatinine Ratio: 10 (calc) (ref 6–22)
BUN: 39 mg/dL — ABNORMAL HIGH (ref 7–25)
CO2: 21 mmol/L (ref 20–32)
Calcium: 9.3 mg/dL (ref 8.6–10.3)
Chloride: 108 mmol/L (ref 98–110)
Creat: 3.8 mg/dL — ABNORMAL HIGH (ref 0.70–1.18)
GFR, Est African American: 17 mL/min/1.73m2 — ABNORMAL LOW
GFR, Est Non African American: 15 mL/min/1.73m2 — ABNORMAL LOW
Globulin: 2.9 g/dL (ref 1.9–3.7)
Glucose, Bld: 96 mg/dL (ref 65–99)
Potassium: 4.4 mmol/L (ref 3.5–5.3)
Sodium: 140 mmol/L (ref 135–146)
Total Bilirubin: 0.4 mg/dL (ref 0.2–1.2)
Total Protein: 7 g/dL (ref 6.1–8.1)

## 2020-09-03 LAB — CBC WITH DIFFERENTIAL/PLATELET
Absolute Monocytes: 806 cells/uL (ref 200–950)
Basophils Absolute: 48 cells/uL (ref 0–200)
Basophils Relative: 0.5 %
Eosinophils Absolute: 346 cells/uL (ref 15–500)
Eosinophils Relative: 3.6 %
HCT: 35.3 % — ABNORMAL LOW (ref 38.5–50.0)
Hemoglobin: 11.6 g/dL — ABNORMAL LOW (ref 13.2–17.1)
Lymphs Abs: 1517 cells/uL (ref 850–3900)
MCH: 32 pg (ref 27.0–33.0)
MCHC: 32.9 g/dL (ref 32.0–36.0)
MCV: 97.5 fL (ref 80.0–100.0)
MPV: 10.4 fL (ref 7.5–12.5)
Monocytes Relative: 8.4 %
Neutro Abs: 6883 cells/uL (ref 1500–7800)
Neutrophils Relative %: 71.7 %
Platelets: 222 10*3/uL (ref 140–400)
RBC: 3.62 10*6/uL — ABNORMAL LOW (ref 4.20–5.80)
RDW: 14.4 % (ref 11.0–15.0)
Total Lymphocyte: 15.8 %
WBC: 9.6 10*3/uL (ref 3.8–10.8)

## 2020-09-03 LAB — HEMOGLOBIN A1C
Hgb A1c MFr Bld: 5.9 % of total Hgb — ABNORMAL HIGH (ref <5.7)
Mean Plasma Glucose: 123 (calc)
eAG (mmol/L): 6.8 (calc)

## 2020-09-03 LAB — LIPID PANEL
Cholesterol: 185 mg/dL
HDL: 46 mg/dL
LDL Cholesterol (Calc): 116 mg/dL — ABNORMAL HIGH
Non-HDL Cholesterol (Calc): 139 mg/dL — ABNORMAL HIGH
Total CHOL/HDL Ratio: 4 (calc)
Triglycerides: 115 mg/dL

## 2020-09-03 LAB — BRAIN NATRIURETIC PEPTIDE: Brain Natriuretic Peptide: 5 pg/mL (ref <100)

## 2020-09-03 NOTE — Therapy (Signed)
[facility] [address] [place], [place], [address] Phone: [phone]   Fax:  [phone]  Patient Details  Name: Bruce Weber. MRN: [id number] Date of Birth: 1951/06/21 Referring Provider:  No ref. provider found  Encounter Date: 09/03/2020  PHYSICAL THERAPY DISCHARGE SUMMARY  Visits from Start of Care: 16  Current functional level related to goals / functional outcomes: Unknown what current status is, but at last re-assessment, patient had reported a flare-up in hip pain which he had suspected was due to the way he slept. See re-assessment on 03/19/20   Remaining deficits: Unknown what current status is, but at last re-assessment, patient had reported a flare-up in hip pain which he had suspected was due to the way he slept. See re-assessment on 03/19/20    Education / Equipment: Plan to continue HEP as tolerated and follow-up with PT in a few weeks. Patient did not return for follow-up PT visit.  Plan: Patient agrees to discharge.  Patient goals were partially met. Patient is being discharged due to not returning since the last visit.  ?????      Clarene Critchley PT, DPT 3:11 PM, 09/03/20 [place], [place], [address] Phone: [phone]   Fax:  [phone]

## 2020-09-05 NOTE — Assessment & Plan Note (Signed)
Managed by Nile Dear. He indicates discomfort and pain meds causing him to sleep more in daytime.  Plan- cautioned to protect good sleep schedule.

## 2020-09-05 NOTE — Assessment & Plan Note (Signed)
Benefits from CPAP and describess good compliance, alternating between 2 machines depending which house he is in. Plan- continue auo 8-20, requesst download from [employer]

## 2020-09-06 DIAGNOSIS — M9901 Segmental and somatic dysfunction of cervical region: Secondary | ICD-10-CM | POA: Diagnosis not present

## 2020-09-06 DIAGNOSIS — S233XXA Sprain of ligaments of thoracic spine, initial encounter: Secondary | ICD-10-CM | POA: Diagnosis not present

## 2020-09-06 DIAGNOSIS — M47812 Spondylosis without myelopathy or radiculopathy, cervical region: Secondary | ICD-10-CM | POA: Diagnosis not present

## 2020-09-06 DIAGNOSIS — M5126 Other intervertebral disc displacement, lumbar region: Secondary | ICD-10-CM | POA: Diagnosis not present

## 2020-09-06 DIAGNOSIS — M47816 Spondylosis without myelopathy or radiculopathy, lumbar region: Secondary | ICD-10-CM | POA: Diagnosis not present

## 2020-09-06 DIAGNOSIS — M9903 Segmental and somatic dysfunction of lumbar region: Secondary | ICD-10-CM | POA: Diagnosis not present

## 2020-09-06 DIAGNOSIS — M9902 Segmental and somatic dysfunction of thoracic region: Secondary | ICD-10-CM | POA: Diagnosis not present

## 2020-09-07 ENCOUNTER — Encounter: Payer: Self-pay | Admitting: Family Medicine

## 2020-09-07 DIAGNOSIS — N184 Chronic kidney disease, stage 4 (severe): Secondary | ICD-10-CM | POA: Insufficient documentation

## 2020-09-07 DIAGNOSIS — R0602 Shortness of breath: Secondary | ICD-10-CM | POA: Insufficient documentation

## 2020-09-07 DIAGNOSIS — E119 Type 2 diabetes mellitus without complications: Secondary | ICD-10-CM | POA: Insufficient documentation

## 2020-09-07 DIAGNOSIS — I10 Essential (primary) hypertension: Secondary | ICD-10-CM | POA: Insufficient documentation

## 2020-09-07 MED ORDER — TRIAMCINOLONE ACETONIDE 0.025 % EX OINT
1.0000 | TOPICAL_OINTMENT | Freq: Two times a day (BID) | CUTANEOUS | 0 refills | Status: DC
Start: 2020-09-07 — End: 2022-11-23

## 2020-09-07 MED ORDER — CYCLOBENZAPRINE HCL 5 MG PO TABS: ORAL_TABLET | ORAL | 3 refills | Status: DC

## 2020-09-15 DIAGNOSIS — S233XXA Sprain of ligaments of thoracic spine, initial encounter: Secondary | ICD-10-CM | POA: Diagnosis not present

## 2020-09-15 DIAGNOSIS — M5126 Other intervertebral disc displacement, lumbar region: Secondary | ICD-10-CM | POA: Diagnosis not present

## 2020-09-15 DIAGNOSIS — M9902 Segmental and somatic dysfunction of thoracic region: Secondary | ICD-10-CM | POA: Diagnosis not present

## 2020-09-15 DIAGNOSIS — M47816 Spondylosis without myelopathy or radiculopathy, lumbar region: Secondary | ICD-10-CM | POA: Diagnosis not present

## 2020-09-15 DIAGNOSIS — M9903 Segmental and somatic dysfunction of lumbar region: Secondary | ICD-10-CM | POA: Diagnosis not present

## 2020-09-15 DIAGNOSIS — M47812 Spondylosis without myelopathy or radiculopathy, cervical region: Secondary | ICD-10-CM | POA: Diagnosis not present

## 2020-09-15 DIAGNOSIS — M9901 Segmental and somatic dysfunction of cervical region: Secondary | ICD-10-CM | POA: Diagnosis not present

## 2020-09-21 DIAGNOSIS — M47816 Spondylosis without myelopathy or radiculopathy, lumbar region: Secondary | ICD-10-CM | POA: Diagnosis not present

## 2020-09-21 DIAGNOSIS — M9903 Segmental and somatic dysfunction of lumbar region: Secondary | ICD-10-CM | POA: Diagnosis not present

## 2020-09-21 DIAGNOSIS — M9901 Segmental and somatic dysfunction of cervical region: Secondary | ICD-10-CM | POA: Diagnosis not present

## 2020-09-21 DIAGNOSIS — M47812 Spondylosis without myelopathy or radiculopathy, cervical region: Secondary | ICD-10-CM | POA: Diagnosis not present

## 2020-09-21 DIAGNOSIS — M9902 Segmental and somatic dysfunction of thoracic region: Secondary | ICD-10-CM | POA: Diagnosis not present

## 2020-09-21 DIAGNOSIS — M5126 Other intervertebral disc displacement, lumbar region: Secondary | ICD-10-CM | POA: Diagnosis not present

## 2020-09-21 DIAGNOSIS — S233XXA Sprain of ligaments of thoracic spine, initial encounter: Secondary | ICD-10-CM | POA: Diagnosis not present

## 2020-09-29 ENCOUNTER — Other Ambulatory Visit: Payer: Self-pay

## 2020-09-29 ENCOUNTER — Ambulatory Visit (HOSPITAL_COMMUNITY): Payer: Medicare Other | Attending: Cardiovascular Disease

## 2020-09-29 ENCOUNTER — Ambulatory Visit
Admission: EM | Admit: 2020-09-29 | Discharge: 2020-09-29 | Disposition: A | Discharge status: Home / Self Care | Payer: Medicare Other | Attending: Emergency Medicine | Admitting: Emergency Medicine

## 2020-09-29 DIAGNOSIS — J0101 Acute recurrent maxillary sinusitis: Secondary | ICD-10-CM

## 2020-09-29 DIAGNOSIS — R0602 Shortness of breath: Secondary | ICD-10-CM

## 2020-09-29 DIAGNOSIS — I10 Essential (primary) hypertension: Secondary | ICD-10-CM | POA: Diagnosis not present

## 2020-09-29 LAB — ECHOCARDIOGRAM COMPLETE
Area-P 1/2: 4.31 cm2
S' Lateral: 3.2 cm

## 2020-09-29 MED ORDER — AMOXICILLIN-POT CLAVULANATE 875-125 MG PO TABS: 1.0000 | ORAL_TABLET | Freq: Two times a day (BID) | ORAL | 0 refills | Status: DC

## 2020-09-29 MED ORDER — CETIRIZINE HCL 10 MG PO TABS
10.0000 mg | ORAL_TABLET | Freq: Every day | ORAL | 0 refills | Status: DC
Start: 2020-09-29 — End: 2020-09-29

## 2020-09-29 MED ORDER — CETIRIZINE HCL 10 MG PO TABS: 10.0000 mg | ORAL_TABLET | Freq: Every day | ORAL | 0 refills | Status: DC

## 2020-09-29 MED ORDER — PREDNISONE 10 MG PO TABS: 20.0000 mg | ORAL_TABLET | Freq: Every day | ORAL | 0 refills | Status: DC

## 2020-09-29 MED ORDER — FLUTICASONE PROPIONATE 50 MCG/ACT NA SUSP: 1.0000 | Freq: Every day | NASAL | 0 refills | Status: DC

## 2020-09-29 NOTE — Discharge Instructions (Addendum)
Get plenty of rest and push fluids Augmentin was prescribed Prednisone was prescribed Flonase was prescribed/take as directed Use medications daily for symptom relief Use OTC medications like ibuprofen or tylenol as needed fever or pain Call or go to the ED if you have any new or worsening symptoms such as fever, worsening cough, shortness of breath, chest tightness, chest pain, turning blue, changes in mental status, etc..Marland Kitchen

## 2020-09-29 NOTE — ED Provider Notes (Addendum)
[facility]   [id number] 09/29/20 Arrival Time: 8502   Chief Complaint  Patient presents with  . Facial Pain     SUBJECTIVE: History from: patient.  Bruce Weber. is a 69 y.o. male who presents  to the urgent care with a complaint of nasal congestion, sinus pain, and sinus pressure for the past 1 week.  Report white-yellowish nasal discharge.  Denies sick exposure to COVID, flu or strep or anyone with the same symptom.  Denies recent travel.  Has tried OTC medication without relief.  Symptoms are made worse by lying down.  Report previous symptoms in the past that improved with Flonase, prednisone and azithromycin.   Denies fever, chills, fatigue, sore throat, SOB, wheezing, chest pain, nausea, changes in bowel or bladder habits.     ROS: As per HPI.  All other pertinent ROS negative.      Past Medical History:  Diagnosis Date  . Adenoma 11/2009   1.1 cm from TCS  . DM (diabetes mellitus) ([place])   . GERD (gastroesophageal reflux disease)   . High cholesterol   . HTN (hypertension)   . Kidney stones   . Obesity (BMI 30-39.9) DEC 2011 223 LBS  . Rectal bleeding 2011   secondary to hemorrhoids   Past Surgical History:  Procedure Laterality Date  . CIRCUMCISION    . COLONOSCOPY  Dec 2010 [facility]   MOD IH, SIMPLE ADENOMA 1.1 CM  . COLONOSCOPY  11/25/2012   SLF: 1. Sessile polyp measuring 87mm in size was found at the hepatic flesure; polypectomy was performed using snare cautery 2. Moderate diverticulosis was noted in the ascending colon and sigmoid colon. 3. The colon mucosa was otherwise normal 4. Large internal hemorrhoids.   . COLONOSCOPY N/A 03/01/2017   Procedure: COLONOSCOPY;  Surgeon: Danie Binder, MD;  Location: [facility];  Service: Endoscopy;  Laterality: N/A;  10:30 AM  . EYE SURGERY  05/2018   bilateral cataract surgery  . HERNIA REPAIR     Allergies  Allergen Reactions  . Dye Fdc Blue [Brilliant Blue Fcf (Fd&C Blue #1)]     PT UNSURE WHICH  DYE  . Iodinated Diagnostic Agents Hives    30 YEARS AGO, recent 2021 epidural with contrast ok   No current facility-administered medications on file prior to encounter.   Current Outpatient Medications on File Prior to Encounter  Medication Sig Dispense Refill  . acetaminophen (TYLENOL) 500 MG tablet Take 1,000 mg by mouth 2 (two) times daily as needed for headache.    . allopurinol (ZYLOPRIM) 100 MG tablet Take 300 mg by mouth daily.     Marland Kitchen amLODipine (NORVASC) 10 MG tablet Take 10 mg by mouth daily.    Marland Kitchen atorvastatin (LIPITOR) 10 MG tablet Take 1 tablet (10 mg total) by mouth daily. 90 tablet 3  . calcitRIOL (ROCALTROL) 0.25 MCG capsule Take 0.25 mcg by mouth at bedtime.     . clomiPHENE (CLOMID) 50 MG tablet Take 25 mg by mouth at bedtime.     . cyclobenzaprine (FLEXERIL) 5 MG tablet TAKE 1 TABLET BY MOUTH AT BEDTIME AS NEEDED FOR MUSCLE SPASMS. 30 tablet 3  . diclofenac Sodium (VOLTAREN) 1 % GEL Apply 2 g topically 4 (four) times daily. 50 g 0  . famciclovir (FAMVIR) 500 MG tablet Take 1,000 mg by mouth 2 (two) times daily as needed (fever blisters).   5  . famotidine (PEPCID) 40 MG tablet TAKE 1 TABLET (40 MG TOTAL) BY MOUTH DAILY.  WITH BREAKFAST 90 tablet 3  . felodipine (PLENDIL) 10 MG 24 hr tablet Take 10 mg by mouth daily.    . furosemide (LASIX) 40 MG tablet Take 40 mg by mouth.    . gabapentin (NEURONTIN) 100 MG capsule TAKE 1 CAPSULE BY MOUTH THREE TIMES A DAY 90 capsule 2  . hydrALAZINE (APRESOLINE) 25 MG tablet Take 25 mg by mouth 3 (three) times daily.    Marland Kitchen HYDROcodone-acetaminophen (NORCO/VICODIN) 5-325 MG tablet Take 1 tablet by mouth every 6 (six) hours as needed. 8 tablet 0  . propranolol (INDERAL) 80 MG tablet TAKE 1 TABLET (80 MG TOTAL) BY MOUTH 2 (TWO) TIMES DAILY. 180 tablet 1  . psyllium (METAMUCIL) 58.6 % powder Take 1 packet by mouth daily. As needed for constipation    . tamsulosin (FLOMAX) 0.4 MG CAPS capsule Take 0.4 mg by mouth daily.    . traZODone (DESYREL)  50 MG tablet TAKE 1 TABLET BY MOUTH EVERYDAY AT BEDTIME 90 tablet 2  . triamcinolone (KENALOG) 0.025 % ointment Apply 1 application topically 2 (two) times daily. 30 g 0  . vitamin C (ASCORBIC ACID) 500 MG tablet Take 500 mg by mouth 2 (two) times a week.      Social History   Socioeconomic History  . Marital status: Married    Spouse name: Not on file  . Number of children: 0  . Years of education: Not on file  . Highest education level: Not on file  Occupational History  . 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 Smoking status: Never Smoker  . Smokeless tobacco: Never Used  . Tobacco comment: Never smoked  Vaping Use  . Vaping Use: Never used  Substance and Sexual Activity  . Alcohol use: No  . Drug use: No  . Sexual activity: Never  Other Topics Concern  . Not on file  Social History Narrative   NO KIDS. MARRIED TO 2ND WIFE.      02/06/2019: Lives with mother at this time assisting in her care, while wife lives nearby, taking care of her own parents.   Works from home full time, but planning on retiring in next month to focus on health/well-being   Used to work out on treadmill, but has not in recent months   Motivated to return to exercise regimine and better diet control   Enjoys watching basketball, football      Social Determinants of Health   Financial Resource Strain: Low Risk   . Difficulty of Paying Living Expenses: Not hard at all  Food Insecurity: No Food Insecurity  . Worried About Charity fundraiser in the Last Year: Never true  . Ran Out of Food in the Last Year: Never true  Transportation Needs: No Transportation Needs  . Lack of Transportation (Medical): No  . Lack of Transportation (Non-Medical): No  Physical Activity: Inactive  . Days of Exercise per Week: 0 days  . Minutes of Exercise per Session: 0  min  Stress: No Stress Concern Present  . Feeling of Stress : Not at all  Social Connections: Socially Integrated  . Frequency of Communication with Friends and Family: More than three times a week  . Frequency of Social Gatherings with Friends and Family: More than three times a week  . Attends Religious Services: More than  4 times per year  . Active Member of Clubs or Organizations: Yes  . Attends Archivist Meetings: More than 4 times per year  . Marital Status: Married  Human resources Weber Violence: Not At Risk  . Fear of Current or Ex-Partner: No  . Emotionally Abused: No  . Physically Abused: No  . Sexually Abused: No   Family History  Problem Relation Age of Onset  . Lung cancer Father   . High blood pressure Father   . Colon cancer Neg Hx   . Colon polyps Neg Hx     OBJECTIVE:  Vitals:   09/29/20 1602  BP: 125/79  Pulse: 61  Resp: 16  Temp: 97.8 F (36.6 C)  SpO2: 98%     Physical Exam Vitals and nursing note reviewed.  Constitutional:      General: He is not in acute distress.    Appearance: Normal appearance. He is normal weight. He is not ill-appearing, toxic-appearing or diaphoretic.  HENT:     Head: Normocephalic.     Right Ear: Tympanic membrane, ear canal and external ear normal. There is no impacted cerumen.     Left Ear: Tympanic membrane, ear canal and external ear normal. There is no impacted cerumen.     Nose: Congestion present.     Right Sinus: Maxillary sinus tenderness present.     Left Sinus: Maxillary sinus tenderness present.     Mouth/Throat:     Mouth: Mucous membranes are moist.     Pharynx: No oropharyngeal exudate.  Cardiovascular:     Rate and Rhythm: Normal rate and regular rhythm.     Pulses: Normal pulses.     Heart sounds: Normal heart sounds. No murmur heard.  No friction rub. No gallop.   Pulmonary:     Effort: Pulmonary effort is normal. No respiratory distress.     Breath sounds: Normal breath sounds. No  stridor. No wheezing, rhonchi or rales.  Chest:     Chest wall: No tenderness.  Neurological:     Mental Status: He is alert and oriented to person, place, and time.     LABS:  No results found for this or any previous visit (from the past 24 hour(s)).   ASSESSMENT & PLAN:  1. Acute recurrent maxillary sinusitis     Meds ordered this encounter  Medications  . fluticasone (FLONASE) 50 MCG/ACT nasal spray    Sig: Place 1 spray into both nostrils daily for 14 days.    Dispense:  16 g    Refill:  0  . DISCONTD: cetirizine (ZYRTEC ALLERGY) 10 MG tablet    Sig: Take 1 tablet (10 mg total) by mouth daily.    Dispense:  30 tablet    Refill:  0  . predniSONE (DELTASONE) 10 MG tablet    Sig: Take 2 tablets (20 mg total) by mouth daily.    Dispense:  15 tablet    Refill:  0  . DISCONTD: cetirizine (ZYRTEC ALLERGY) 10 MG tablet    Sig: Take 1 tablet (10 mg total) by mouth daily.    Dispense:  30 tablet    Refill:  0  . amoxicillin-clavulanate (AUGMENTIN) 875-125 MG tablet    Sig: Take 1 tablet by mouth every 12 (twelve) hours.    Dispense:  14 tablet    Refill:  0    Discharge instructions  Get plenty of rest and push fluids Augmentin was prescribed Prednisone was prescribed Flonase was prescribed/take as directed Use medications  daily for symptom relief Use OTC medications like ibuprofen or tylenol as needed fever or pain Call or go to the ED if you have any new or worsening symptoms such as fever, worsening cough, shortness of breath, chest tightness, chest pain, turning blue, changes in mental status, etc...   Reviewed expectations re: course of current medical issues. Questions answered. Outlined signs and symptoms indicating need for more acute intervention. Patient verbalized understanding. After Visit Summary given.         Emerson Monte, FNP 09/29/20 1632    Emerson Monte, FNP 09/29/20 1636

## 2020-09-29 NOTE — ED Triage Notes (Signed)
Pt states he has had nasal congestion for past week

## 2020-10-03 ENCOUNTER — Other Ambulatory Visit: Payer: Self-pay | Admitting: Orthopedic Surgery

## 2020-10-03 DIAGNOSIS — M545 Low back pain, unspecified: Secondary | ICD-10-CM

## 2020-10-04 DIAGNOSIS — E1122 Type 2 diabetes mellitus with diabetic chronic kidney disease: Secondary | ICD-10-CM | POA: Diagnosis not present

## 2020-10-04 DIAGNOSIS — Z9989 Dependence on other enabling machines and devices: Secondary | ICD-10-CM | POA: Diagnosis not present

## 2020-10-04 DIAGNOSIS — N2581 Secondary hyperparathyroidism of renal origin: Secondary | ICD-10-CM | POA: Diagnosis not present

## 2020-10-04 DIAGNOSIS — E669 Obesity, unspecified: Secondary | ICD-10-CM | POA: Diagnosis not present

## 2020-10-04 DIAGNOSIS — N184 Chronic kidney disease, stage 4 (severe): Secondary | ICD-10-CM | POA: Diagnosis not present

## 2020-10-04 DIAGNOSIS — Z8739 Personal history of other diseases of the musculoskeletal system and connective tissue: Secondary | ICD-10-CM | POA: Diagnosis not present

## 2020-10-04 DIAGNOSIS — G4733 Obstructive sleep apnea (adult) (pediatric): Secondary | ICD-10-CM | POA: Diagnosis not present

## 2020-10-04 DIAGNOSIS — M1990 Unspecified osteoarthritis, unspecified site: Secondary | ICD-10-CM | POA: Diagnosis not present

## 2020-10-04 DIAGNOSIS — D631 Anemia in chronic kidney disease: Secondary | ICD-10-CM | POA: Diagnosis not present

## 2020-10-04 DIAGNOSIS — R809 Proteinuria, unspecified: Secondary | ICD-10-CM | POA: Diagnosis not present

## 2020-10-04 DIAGNOSIS — I129 Hypertensive chronic kidney disease with stage 1 through stage 4 chronic kidney disease, or unspecified chronic kidney disease: Secondary | ICD-10-CM | POA: Diagnosis not present

## 2020-10-04 DIAGNOSIS — N189 Chronic kidney disease, unspecified: Secondary | ICD-10-CM | POA: Diagnosis not present

## 2020-10-04 DIAGNOSIS — Z87442 Personal history of urinary calculi: Secondary | ICD-10-CM | POA: Diagnosis not present

## 2020-10-07 DIAGNOSIS — M5126 Other intervertebral disc displacement, lumbar region: Secondary | ICD-10-CM | POA: Diagnosis not present

## 2020-10-07 DIAGNOSIS — M47816 Spondylosis without myelopathy or radiculopathy, lumbar region: Secondary | ICD-10-CM | POA: Diagnosis not present

## 2020-10-07 DIAGNOSIS — M9903 Segmental and somatic dysfunction of lumbar region: Secondary | ICD-10-CM | POA: Diagnosis not present

## 2020-10-07 DIAGNOSIS — S233XXA Sprain of ligaments of thoracic spine, initial encounter: Secondary | ICD-10-CM | POA: Diagnosis not present

## 2020-10-07 DIAGNOSIS — M47812 Spondylosis without myelopathy or radiculopathy, cervical region: Secondary | ICD-10-CM | POA: Diagnosis not present

## 2020-10-07 DIAGNOSIS — M9902 Segmental and somatic dysfunction of thoracic region: Secondary | ICD-10-CM | POA: Diagnosis not present

## 2020-10-07 DIAGNOSIS — M9901 Segmental and somatic dysfunction of cervical region: Secondary | ICD-10-CM | POA: Diagnosis not present

## 2020-10-14 DIAGNOSIS — M9903 Segmental and somatic dysfunction of lumbar region: Secondary | ICD-10-CM | POA: Diagnosis not present

## 2020-10-14 DIAGNOSIS — M9902 Segmental and somatic dysfunction of thoracic region: Secondary | ICD-10-CM | POA: Diagnosis not present

## 2020-10-14 DIAGNOSIS — M9901 Segmental and somatic dysfunction of cervical region: Secondary | ICD-10-CM | POA: Diagnosis not present

## 2020-10-14 DIAGNOSIS — M47812 Spondylosis without myelopathy or radiculopathy, cervical region: Secondary | ICD-10-CM | POA: Diagnosis not present

## 2020-10-14 DIAGNOSIS — S233XXA Sprain of ligaments of thoracic spine, initial encounter: Secondary | ICD-10-CM | POA: Diagnosis not present

## 2020-10-14 DIAGNOSIS — M47816 Spondylosis without myelopathy or radiculopathy, lumbar region: Secondary | ICD-10-CM | POA: Diagnosis not present

## 2020-10-14 DIAGNOSIS — Z961 Presence of intraocular lens: Secondary | ICD-10-CM | POA: Diagnosis not present

## 2020-10-14 DIAGNOSIS — M5126 Other intervertebral disc displacement, lumbar region: Secondary | ICD-10-CM | POA: Diagnosis not present

## 2020-10-21 DIAGNOSIS — Z23 Encounter for immunization: Secondary | ICD-10-CM | POA: Diagnosis not present

## 2020-10-21 DIAGNOSIS — M47816 Spondylosis without myelopathy or radiculopathy, lumbar region: Secondary | ICD-10-CM | POA: Diagnosis not present

## 2020-10-21 DIAGNOSIS — M5126 Other intervertebral disc displacement, lumbar region: Secondary | ICD-10-CM | POA: Diagnosis not present

## 2020-10-21 DIAGNOSIS — M9902 Segmental and somatic dysfunction of thoracic region: Secondary | ICD-10-CM | POA: Diagnosis not present

## 2020-10-21 DIAGNOSIS — M9901 Segmental and somatic dysfunction of cervical region: Secondary | ICD-10-CM | POA: Diagnosis not present

## 2020-10-21 DIAGNOSIS — M9903 Segmental and somatic dysfunction of lumbar region: Secondary | ICD-10-CM | POA: Diagnosis not present

## 2020-10-21 DIAGNOSIS — S233XXA Sprain of ligaments of thoracic spine, initial encounter: Secondary | ICD-10-CM | POA: Diagnosis not present

## 2020-10-21 DIAGNOSIS — M47812 Spondylosis without myelopathy or radiculopathy, cervical region: Secondary | ICD-10-CM | POA: Diagnosis not present

## 2020-10-27 ENCOUNTER — Encounter: Payer: Self-pay | Admitting: Internal Medicine

## 2020-10-27 DIAGNOSIS — S233XXA Sprain of ligaments of thoracic spine, initial encounter: Secondary | ICD-10-CM | POA: Diagnosis not present

## 2020-10-27 DIAGNOSIS — M9903 Segmental and somatic dysfunction of lumbar region: Secondary | ICD-10-CM | POA: Diagnosis not present

## 2020-10-27 DIAGNOSIS — M9901 Segmental and somatic dysfunction of cervical region: Secondary | ICD-10-CM | POA: Diagnosis not present

## 2020-10-27 DIAGNOSIS — M47816 Spondylosis without myelopathy or radiculopathy, lumbar region: Secondary | ICD-10-CM | POA: Diagnosis not present

## 2020-10-27 DIAGNOSIS — M9902 Segmental and somatic dysfunction of thoracic region: Secondary | ICD-10-CM | POA: Diagnosis not present

## 2020-10-27 DIAGNOSIS — M47812 Spondylosis without myelopathy or radiculopathy, cervical region: Secondary | ICD-10-CM | POA: Diagnosis not present

## 2020-10-27 DIAGNOSIS — M5126 Other intervertebral disc displacement, lumbar region: Secondary | ICD-10-CM | POA: Diagnosis not present

## 2020-11-05 DIAGNOSIS — M47816 Spondylosis without myelopathy or radiculopathy, lumbar region: Secondary | ICD-10-CM | POA: Diagnosis not present

## 2020-11-05 DIAGNOSIS — M5126 Other intervertebral disc displacement, lumbar region: Secondary | ICD-10-CM | POA: Diagnosis not present

## 2020-11-05 DIAGNOSIS — M9902 Segmental and somatic dysfunction of thoracic region: Secondary | ICD-10-CM | POA: Diagnosis not present

## 2020-11-05 DIAGNOSIS — S233XXA Sprain of ligaments of thoracic spine, initial encounter: Secondary | ICD-10-CM | POA: Diagnosis not present

## 2020-11-05 DIAGNOSIS — M47812 Spondylosis without myelopathy or radiculopathy, cervical region: Secondary | ICD-10-CM | POA: Diagnosis not present

## 2020-11-05 DIAGNOSIS — M9903 Segmental and somatic dysfunction of lumbar region: Secondary | ICD-10-CM | POA: Diagnosis not present

## 2020-11-05 DIAGNOSIS — M9901 Segmental and somatic dysfunction of cervical region: Secondary | ICD-10-CM | POA: Diagnosis not present

## 2020-11-12 DIAGNOSIS — M9902 Segmental and somatic dysfunction of thoracic region: Secondary | ICD-10-CM | POA: Diagnosis not present

## 2020-11-12 DIAGNOSIS — M9901 Segmental and somatic dysfunction of cervical region: Secondary | ICD-10-CM | POA: Diagnosis not present

## 2020-11-12 DIAGNOSIS — M47816 Spondylosis without myelopathy or radiculopathy, lumbar region: Secondary | ICD-10-CM | POA: Diagnosis not present

## 2020-11-12 DIAGNOSIS — S233XXA Sprain of ligaments of thoracic spine, initial encounter: Secondary | ICD-10-CM | POA: Diagnosis not present

## 2020-11-12 DIAGNOSIS — M9903 Segmental and somatic dysfunction of lumbar region: Secondary | ICD-10-CM | POA: Diagnosis not present

## 2020-11-12 DIAGNOSIS — M47812 Spondylosis without myelopathy or radiculopathy, cervical region: Secondary | ICD-10-CM | POA: Diagnosis not present

## 2020-11-12 DIAGNOSIS — M5126 Other intervertebral disc displacement, lumbar region: Secondary | ICD-10-CM | POA: Diagnosis not present

## 2020-11-16 ENCOUNTER — Other Ambulatory Visit: Payer: Self-pay | Admitting: Internal Medicine

## 2020-11-17 NOTE — Telephone Encounter (Signed)
Desyrel script refilled to [facility]

## 2020-11-17 NOTE — Telephone Encounter (Signed)
Dr. Annamaria Boots, please advise if you are okay refilling med.  Allergies  Allergen Reactions  . Dye Fdc Blue [Brilliant Blue Fcf (Fd&C Blue #1)]     PT UNSURE WHICH DYE  . Iodinated Diagnostic Agents Hives    30 YEARS AGO, recent 2021 epidural with contrast ok     Current Outpatient Medications:  .  acetaminophen (TYLENOL) 500 MG tablet, Take 1,000 mg by mouth 2 (two) times daily as needed for headache., Disp: , Rfl:  .  allopurinol (ZYLOPRIM) 100 MG tablet, Take 300 mg by mouth daily. , Disp: , Rfl:  .  amLODipine (NORVASC) 10 MG tablet, Take 10 mg by mouth daily., Disp: , Rfl:  .  amoxicillin-clavulanate (AUGMENTIN) 875-125 MG tablet, Take 1 tablet by mouth every 12 (twelve) hours., Disp: 14 tablet, Rfl: 0 .  atorvastatin (LIPITOR) 10 MG tablet, Take 1 tablet (10 mg total) by mouth daily., Disp: 90 tablet, Rfl: 3 .  calcitRIOL (ROCALTROL) 0.25 MCG capsule, Take 0.25 mcg by mouth at bedtime. , Disp: , Rfl:  .  clomiPHENE (CLOMID) 50 MG tablet, Take 25 mg by mouth at bedtime. , Disp: , Rfl:  .  cyclobenzaprine (FLEXERIL) 5 MG tablet, TAKE 1 TABLET BY MOUTH AT BEDTIME AS NEEDED FOR MUSCLE SPASMS., Disp: 30 tablet, Rfl: 3 .  diclofenac Sodium (VOLTAREN) 1 % GEL, Apply 2 g topically 4 (four) times daily., Disp: 50 g, Rfl: 0 .  famciclovir (FAMVIR) 500 MG tablet, Take 1,000 mg by mouth 2 (two) times daily as needed (fever blisters). , Disp: , Rfl: 5 .  famotidine (PEPCID) 40 MG tablet, TAKE 1 TABLET (40 MG TOTAL) BY MOUTH DAILY. WITH BREAKFAST, Disp: 90 tablet, Rfl: 3 .  felodipine (PLENDIL) 10 MG 24 hr tablet, Take 10 mg by mouth daily., Disp: , Rfl:  .  fluticasone (FLONASE) 50 MCG/ACT nasal spray, Place 1 spray into both nostrils daily for 14 days., Disp: 16 g, Rfl: 0 .  furosemide (LASIX) 40 MG tablet, Take 40 mg by mouth., Disp: , Rfl:  .  gabapentin (NEURONTIN) 100 MG capsule, TAKE 1 CAPSULE BY MOUTH THREE TIMES A DAY, Disp: 90 capsule, Rfl: 2 .  hydrALAZINE (APRESOLINE) 25 MG tablet, Take 25  mg by mouth 3 (three) times daily., Disp: , Rfl:  .  HYDROcodone-acetaminophen (NORCO/VICODIN) 5-325 MG tablet, Take 1 tablet by mouth every 6 (six) hours as needed., Disp: 8 tablet, Rfl: 0 .  predniSONE (DELTASONE) 10 MG tablet, Take 2 tablets (20 mg total) by mouth daily., Disp: 15 tablet, Rfl: 0 .  propranolol (INDERAL) 80 MG tablet, TAKE 1 TABLET (80 MG TOTAL) BY MOUTH 2 (TWO) TIMES DAILY., Disp: 180 tablet, Rfl: 1 .  psyllium (METAMUCIL) 58.6 % powder, Take 1 packet by mouth daily. As needed for constipation, Disp: , Rfl:  .  tamsulosin (FLOMAX) 0.4 MG CAPS capsule, Take 0.4 mg by mouth daily., Disp: , Rfl:  .  traZODone (DESYREL) 50 MG tablet, TAKE 1 TABLET BY MOUTH EVERYDAY AT BEDTIME, Disp: 90 tablet, Rfl: 2 .  triamcinolone (KENALOG) 0.025 % ointment, Apply 1 application topically 2 (two) times daily., Disp: 30 g, Rfl: 0 .  vitamin C (ASCORBIC ACID) 500 MG tablet, Take 500 mg by mouth 2 (two) times a week. , Disp: , Rfl:

## 2020-11-19 DIAGNOSIS — M9903 Segmental and somatic dysfunction of lumbar region: Secondary | ICD-10-CM | POA: Diagnosis not present

## 2020-11-19 DIAGNOSIS — M9901 Segmental and somatic dysfunction of cervical region: Secondary | ICD-10-CM | POA: Diagnosis not present

## 2020-11-19 DIAGNOSIS — S233XXA Sprain of ligaments of thoracic spine, initial encounter: Secondary | ICD-10-CM | POA: Diagnosis not present

## 2020-11-19 DIAGNOSIS — M5126 Other intervertebral disc displacement, lumbar region: Secondary | ICD-10-CM | POA: Diagnosis not present

## 2020-11-19 DIAGNOSIS — M9902 Segmental and somatic dysfunction of thoracic region: Secondary | ICD-10-CM | POA: Diagnosis not present

## 2020-11-19 DIAGNOSIS — M47812 Spondylosis without myelopathy or radiculopathy, cervical region: Secondary | ICD-10-CM | POA: Diagnosis not present

## 2020-11-19 DIAGNOSIS — M47816 Spondylosis without myelopathy or radiculopathy, lumbar region: Secondary | ICD-10-CM | POA: Diagnosis not present

## 2020-11-25 DIAGNOSIS — M47816 Spondylosis without myelopathy or radiculopathy, lumbar region: Secondary | ICD-10-CM | POA: Diagnosis not present

## 2020-11-25 DIAGNOSIS — M9902 Segmental and somatic dysfunction of thoracic region: Secondary | ICD-10-CM | POA: Diagnosis not present

## 2020-11-25 DIAGNOSIS — M47812 Spondylosis without myelopathy or radiculopathy, cervical region: Secondary | ICD-10-CM | POA: Diagnosis not present

## 2020-11-25 DIAGNOSIS — S233XXA Sprain of ligaments of thoracic spine, initial encounter: Secondary | ICD-10-CM | POA: Diagnosis not present

## 2020-11-25 DIAGNOSIS — M9901 Segmental and somatic dysfunction of cervical region: Secondary | ICD-10-CM | POA: Diagnosis not present

## 2020-11-25 DIAGNOSIS — M9903 Segmental and somatic dysfunction of lumbar region: Secondary | ICD-10-CM | POA: Diagnosis not present

## 2020-11-25 DIAGNOSIS — M5126 Other intervertebral disc displacement, lumbar region: Secondary | ICD-10-CM | POA: Diagnosis not present

## 2020-12-29 ENCOUNTER — Ambulatory Visit: Payer: Medicare Other | Admitting: Internal Medicine

## 2020-12-29 ENCOUNTER — Encounter: Payer: Self-pay | Admitting: Internal Medicine

## 2020-12-29 ENCOUNTER — Other Ambulatory Visit: Payer: Self-pay

## 2020-12-29 VITALS — BP 132/75 | HR 63 | Temp 97.0°F | Ht 70.0 in | Wt 204.6 lb

## 2020-12-29 DIAGNOSIS — D126 Benign neoplasm of colon, unspecified: Secondary | ICD-10-CM

## 2020-12-29 DIAGNOSIS — K219 Gastro-esophageal reflux disease without esophagitis: Secondary | ICD-10-CM

## 2020-12-29 NOTE — Progress Notes (Signed)
Referring Provider: Billie Ruddy, MD Primary Care Physician:  Billie Ruddy, MD Primary GI:  Dr. Abbey Chatters  Chief Complaint  Patient presents with  . Follow-up    Doing well     HPI:   Bruce Khair Chasteen. is a 70 y.o. male who presents to the clinic today for yearly follow-up.  He has chronic reflux which is well maintained on famotidine 40 mg daily.  States he does not take this on weekends as he was told previously not to.  States he does not notice a difference in his symptoms on the days he does not take the medication.  No dysphagia or odynophagia.  No unintentional weight loss.  No abdominal pain.  No melena hematochezia.  Does have history of adenomatous colon polyps.  Last colonoscopy 03/01/2017 with multiple tubular adenomas removed.  Colonoscopy recall 2023.  Past Medical History:  Diagnosis Date  . Adenoma 11/2009   1.1 cm from TCS  . DM (diabetes mellitus) ([place])   . GERD (gastroesophageal reflux disease)   . High cholesterol   . HTN (hypertension)   . Kidney stones   . Obesity (BMI 30-39.9) DEC 2011 223 LBS  . Rectal bleeding 2011   secondary to hemorrhoids    Past Surgical History:  Procedure Laterality Date  . CIRCUMCISION    . COLONOSCOPY  Dec 2010 [facility]   MOD IH, SIMPLE ADENOMA 1.1 CM  . COLONOSCOPY  11/25/2012   SLF: 1. Sessile polyp measuring 56mm in size was found at the hepatic flesure; polypectomy was performed using snare cautery 2. Moderate diverticulosis was noted in the ascending colon and sigmoid colon. 3. The colon mucosa was otherwise normal 4. Large internal hemorrhoids.   . COLONOSCOPY N/A 03/01/2017   Procedure: COLONOSCOPY;  Surgeon: Danie Binder, MD;  [facility]: [facility];  Service: Endoscopy;  Laterality: N/A;  10:30 AM  . EYE SURGERY  05/2018   bilateral cataract surgery  . HERNIA REPAIR      Current Outpatient Medications  Medication Sig Dispense Refill  . acetaminophen (TYLENOL) 500 MG tablet Take 1,000 mg by mouth 2 (two)  times daily as needed for headache.    . allopurinol (ZYLOPRIM) 100 MG tablet Take 300 mg by mouth daily.     Marland Kitchen amLODipine (NORVASC) 10 MG tablet Take 10 mg by mouth daily.    Marland Kitchen atorvastatin (LIPITOR) 10 MG tablet Take 1 tablet (10 mg total) by mouth daily. 90 tablet 3  . calcitRIOL (ROCALTROL) 0.25 MCG capsule Take 0.25 mcg by mouth at bedtime.     . clomiPHENE (CLOMID) 50 MG tablet Take 25 mg by mouth at bedtime.     . cyclobenzaprine (FLEXERIL) 5 MG tablet TAKE 1 TABLET BY MOUTH AT BEDTIME AS NEEDED FOR MUSCLE SPASMS. 30 tablet 3  . famotidine (PEPCID) 40 MG tablet TAKE 1 TABLET (40 MG TOTAL) BY MOUTH DAILY. WITH BREAKFAST 90 tablet 3  . felodipine (PLENDIL) 10 MG 24 hr tablet Take 10 mg by mouth daily.    . furosemide (LASIX) 40 MG tablet Take 40 mg by mouth.    . gabapentin (NEURONTIN) 100 MG capsule TAKE 1 CAPSULE BY MOUTH THREE TIMES A DAY 90 capsule 2  . hydrALAZINE (APRESOLINE) 25 MG tablet Take 25 mg by mouth 3 (three) times daily.    . propranolol (INDERAL) 80 MG tablet TAKE 1 TABLET (80 MG TOTAL) BY MOUTH 2 (TWO) TIMES DAILY. 180 tablet 1  . psyllium (METAMUCIL) 58.6 % powder  Take 1 packet by mouth daily. As needed for constipation    . tamsulosin (FLOMAX) 0.4 MG CAPS capsule Take 0.4 mg by mouth daily.    . traZODone (DESYREL) 50 MG tablet TAKE 1 TABLET BY MOUTH EVERYDAY AT BEDTIME 90 tablet 2  . amoxicillin-clavulanate (AUGMENTIN) 875-125 MG tablet Take 1 tablet by mouth every 12 (twelve) hours. (Patient not taking: Reported on 12/29/2020) 14 tablet 0  . diclofenac Sodium (VOLTAREN) 1 % GEL Apply 2 g topically 4 (four) times daily. (Patient not taking: Reported on 12/29/2020) 50 g 0  . famciclovir (FAMVIR) 500 MG tablet Take 1,000 mg by mouth 2 (two) times daily as needed (fever blisters).  (Patient not taking: Reported on 12/29/2020)  5  . fluticasone (FLONASE) 50 MCG/ACT nasal spray Place 1 spray into both nostrils daily for 14 days. 16 g 0  . HYDROcodone-acetaminophen  (NORCO/VICODIN) 5-325 MG tablet Take 1 tablet by mouth every 6 (six) hours as needed. (Patient not taking: Reported on 12/29/2020) 8 tablet 0  . predniSONE (DELTASONE) 10 MG tablet Take 2 tablets (20 mg total) by mouth daily. (Patient not taking: Reported on 12/29/2020) 15 tablet 0  . triamcinolone (KENALOG) 0.025 % ointment Apply 1 application topically 2 (two) times daily. (Patient not taking: Reported on 12/29/2020) 30 g 0  . vitamin C (ASCORBIC ACID) 500 MG tablet Take 500 mg by mouth 2 (two) times a week.  (Patient not taking: Reported on 12/29/2020)     No current facility-administered medications for this visit.    Allergies as of 12/29/2020 - Review Complete 12/29/2020  Allergen Reaction Noted  . Dye fdc blue [brilliant blue fcf (fd&c blue #1)]  06/12/2011  . Iodinated diagnostic agents Hives 06/12/2011    Family History  Problem Relation Age of Onset  . Lung cancer Father   . High blood pressure Father   . Colon cancer Neg Hx   . Colon polyps Neg Hx     Social History   Socioeconomic History  . Marital status: Married    Spouse name: Not on file  . Number of children: 0  . Years of education: Not on file  . Highest education level: Not on file  Occupational History  . 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 Smoking status: Never Smoker  . Smokeless tobacco: Never Used  . Tobacco comment: Never smoked  Vaping Use  . Vaping Use: Never used  Substance and Sexual Activity  . Alcohol use: No  . Drug use: No  . Sexual activity: Never  Other Topics Concern  . Not on file  Social History Narrative   NO KIDS. MARRIED TO 2ND WIFE.      02/06/2019: Lives with mother at this time assisting in her care, while wife lives nearby, taking care of her own parents.   Works from home full time, but planning on retiring in next month to  focus on health/well-being   Used to work out on treadmill, but has not in recent months   Motivated to return to exercise regimine and better diet control   Enjoys watching basketball, football      Social Determinants of Health   Financial Resource Strain: Low Risk   . Difficulty of Paying Living Expenses: Not hard at all  Food Insecurity: No Food Insecurity  .  Worried About Charity fundraiser in the Last Year: Never true  . Ran Out of Food in the Last Year: Never true  Transportation Needs: No Transportation Needs  . Lack of Transportation (Medical): No  . Lack of Transportation (Non-Medical): No  Physical Activity: Inactive  . Days of Exercise per Week: 0 days  . Minutes of Exercise per Session: 0 min  Stress: No Stress Concern Present  . Feeling of Stress : Not at all  Social Connections: Socially Integrated  . Frequency of Communication with Friends and Family: More than three times a week  . Frequency of Social Gatherings with Friends and Family: More than three times a week  . Attends Religious Services: More than 4 times per year  . Active Member of Clubs or Organizations: Yes  . Attends Archivist Meetings: More than 4 times per year  . Marital Status: Married    Subjective: Review of Systems  Constitutional: Negative for chills and fever.  HENT: Negative for congestion and hearing loss.   Eyes: Negative for blurred vision and double vision.  Respiratory: Negative for cough and shortness of breath.   Cardiovascular: Negative for chest pain and palpitations.  Gastrointestinal: Negative for abdominal pain, blood in stool, constipation, diarrhea, heartburn, melena and vomiting.  Genitourinary: Negative for dysuria and urgency.  Musculoskeletal: Negative for joint pain and myalgias.  Skin: Negative for itching and rash.  Neurological: Negative for dizziness and headaches.  Psychiatric/Behavioral: Negative for depression. The patient is not nervous/anxious.       Objective: BP 132/75   Pulse 63   Temp (!) 97 F (36.1 C) (Temporal)   Ht 5\' 10"  (1.778 m)   Wt 204 lb 9.6 oz (92.8 kg)   BMI 29.36 kg/m  Physical Exam Constitutional:      Appearance: Normal appearance.  HENT:     Head: Normocephalic and atraumatic.  Eyes:     Extraocular Movements: Extraocular movements intact.     Conjunctiva/sclera: Conjunctivae normal.  Cardiovascular:     Rate and Rhythm: Normal rate and regular rhythm.  Pulmonary:     Effort: Pulmonary effort is normal.     Breath sounds: Normal breath sounds.  Abdominal:     General: Bowel sounds are normal.     Palpations: Abdomen is soft.  Musculoskeletal:        General: Normal range of motion.     Cervical back: Normal range of motion and neck supple.  Skin:    General: Skin is warm.  Neurological:     General: No focal deficit present.     Mental Status: He is alert and oriented to person, place, and time.  Psychiatric:        Mood and Affect: Mood normal.        Behavior: Behavior normal.      Assessment: *Chronic reflux *History of adenomatous colon polyp  Plan: Discussed reflux in depth with patient today.  We will attempt to wean him off famotidine altogether.  Patient counseled to take medication every other day for 2 weeks then every 3 days for 2 weeks then discontinue completely.  If he does have breakthrough symptoms, he can restart this medication.  Counseled on avoiding NSAIDs.  Printed off home practices to decrease reflux symptoms.  Colonoscopy recall 2023.  We will schedule this on yearly follow-up visit.  Otherwise he can follow-up as needed  12/29/2020 11:22 AM   Disclaimer: This note was dictated with voice recognition software. Similar sounding words  can inadvertently be transcribed and may not be corrected upon review.

## 2020-12-29 NOTE — Patient Instructions (Signed)
I recommend attempting to wean off the famotidine.  I would take 40 mg every other day for 2 weeks and then 40 mg every 3 days for 2 weeks and then stop medication altogether.  If your symptoms return, you may restart medication at any point.  Follow-up with GI in 1 year or sooner if needed.  Lifestyle and home remedies TO MANAGE REFLUX/HEARTBURN    You may eliminate or reduce the frequency of heartburn by making the following lifestyle changes:    Control your weight. Being overweight is a major risk factor for heartburn and GERD. Excess pounds put pressure on your abdomen, pushing up your stomach and causing acid to back up into your esophagus.     Eat smaller meals. 4 TO 6 MEALS A DAY. This reduces pressure on the lower esophageal sphincter, helping to prevent the valve from opening and acid from washing back into your esophagus.      Loosen your belt. Clothes that fit tightly around your waist put pressure on your abdomen and the lower esophageal sphincter.      Eliminate heartburn triggers. Everyone has specific triggers. Common triggers such as fatty or fried foods, spicy food, tomato sauce, carbonated beverages, alcohol, chocolate, mint, garlic, onion, caffeine and nicotine may make heartburn worse.     Avoid stooping or bending. Tying your shoes is OK. Bending over for longer periods to weed your garden isn't, especially soon after eating.     Don't lie down after a meal. Wait at least three to four hours after eating before going to bed, and don't lie down right after eating.   At [facility] we value your feedback. You may receive a survey about your visit today. Please share your experience as we strive to create trusting relationships with our patients to provide genuine, compassionate, quality care.  We appreciate your understanding and patience as we review any laboratory studies, imaging, and other diagnostic tests that are ordered as we care for you. Our  office policy is 5 business days for review of these results, and any emergent or urgent results are addressed in a timely manner for your best interest. If you do not hear from our office in 1 week, please contact us.   We also encourage the use of MyChart, which contains your medical information for your review as well. If you are not enrolled in this feature, an access code is on this after visit summary for your convenience. Thank you for allowing [place] to be involved in your care.  It was great to see you today!  I hope you have a great rest of your winter!!    Elon Alas. Abbey Chatters, D.O. Gastroenterology and Hepatology [facility]

## 2021-01-19 ENCOUNTER — Other Ambulatory Visit: Payer: Self-pay | Admitting: Orthopedic Surgery

## 2021-01-19 DIAGNOSIS — M545 Low back pain, unspecified: Secondary | ICD-10-CM

## 2021-01-26 DIAGNOSIS — R7303 Prediabetes: Secondary | ICD-10-CM | POA: Insufficient documentation

## 2021-01-26 DIAGNOSIS — Z87442 Personal history of urinary calculi: Secondary | ICD-10-CM | POA: Insufficient documentation

## 2021-01-26 DIAGNOSIS — Z01818 Encounter for other preprocedural examination: Secondary | ICD-10-CM | POA: Insufficient documentation

## 2021-02-02 ENCOUNTER — Other Ambulatory Visit: Payer: Self-pay | Admitting: Family Medicine

## 2021-02-02 DIAGNOSIS — I10 Essential (primary) hypertension: Secondary | ICD-10-CM

## 2021-02-21 ENCOUNTER — Other Ambulatory Visit: Payer: Self-pay | Admitting: Family Medicine

## 2021-02-21 ENCOUNTER — Other Ambulatory Visit: Payer: Self-pay | Admitting: Gastroenterology

## 2021-02-21 DIAGNOSIS — K219 Gastro-esophageal reflux disease without esophagitis: Secondary | ICD-10-CM

## 2021-03-01 ENCOUNTER — Ambulatory Visit
Admission: EM | Admit: 2021-03-01 | Discharge: 2021-03-01 | Disposition: A | Discharge status: Home / Self Care | Payer: Medicare Other | Attending: Emergency Medicine | Admitting: Emergency Medicine

## 2021-03-01 ENCOUNTER — Encounter: Payer: Self-pay | Admitting: Emergency Medicine

## 2021-03-01 ENCOUNTER — Other Ambulatory Visit: Payer: Self-pay

## 2021-03-01 DIAGNOSIS — H00011 Hordeolum externum right upper eyelid: Secondary | ICD-10-CM

## 2021-03-01 DIAGNOSIS — H02841 Edema of right upper eyelid: Secondary | ICD-10-CM

## 2021-03-01 MED ORDER — ERYTHROMYCIN 5 MG/GM OP OINT: TOPICAL_OINTMENT | OPHTHALMIC | 0 refills | Status: DC

## 2021-03-01 NOTE — ED Triage Notes (Signed)
Right eye lid swollen, draining x 2 days.

## 2021-03-01 NOTE — Discharge Instructions (Signed)
Perform warm compresses at home.  Soak a wash cloth in warm (not scalding) water and place it over the eyes. As the wash cloth cools, it should be rewarmed and replaced for a total of 5 to 10 minutes of soaking time. Warm compresses should be applied two to four times a day as long as the patient has symptoms Perform lid washing: Either warm water or very dilute baby shampoo can be placed on a clean wash cloth, gauze pad, or cotton swab. Then be advised to gently clean along the lashes and lid margin to remove the accumulated material with care to avoid contacting the ocular surface. If shampoo is used, thorough rinsing is recommended. Vigorous washing should be avoided, as it may cause more irritation.  Prescribed erythromycin ointment.  Apply up to 6 times daily for 5-7 days, or until symptomatic improvement Follow up with ophthalmology for further evaluation and management if symptoms persists Return or go to ER if you have any new or worsening symptoms such as fever, chills, redness, swelling, eye pain, painful eye movements, vision changes, etc..Marland Kitchen

## 2021-03-01 NOTE — ED Provider Notes (Signed)
[place]   [id number] 03/01/21 Arrival Time: 25  CC: Red eye  SUBJECTIVE:  Bruce Tant. is a 70 y.o. male who presents with complaint of RT upper eyelid with swelling and redness x few days.  Denies a precipitating event, trauma, or close contacts with similar symptoms.  Has tried OTC eye drops with relief.  Symptoms are made worse with the touch.  Denies similar symptoms in the past.  Complains of associated drainage.  Denies fever, chills, nausea, vomiting, eye pain, painful eye movements,  vision changes, double vision, FB sensation, periorbital erythema.     ROS: As per HPI.  All other pertinent ROS negative.     Past Medical History:  Diagnosis Date  . Adenoma 11/2009   1.1 cm from TCS  . DM (diabetes mellitus) ([place])   . GERD (gastroesophageal reflux disease)   . High cholesterol   . HTN (hypertension)   . Kidney stones   . Obesity (BMI 30-39.9) DEC 2011 223 LBS  . Rectal bleeding 2011   secondary to hemorrhoids   Past Surgical History:  Procedure Laterality Date  . CIRCUMCISION    . COLONOSCOPY  Dec 2010 [facility]   MOD IH, SIMPLE ADENOMA 1.1 CM  . COLONOSCOPY  11/25/2012   SLF: 1. Sessile polyp measuring 79mm in size was found at the hepatic flesure; polypectomy was performed using snare cautery 2. Moderate diverticulosis was noted in the ascending colon and sigmoid colon. 3. The colon mucosa was otherwise normal 4. Large internal hemorrhoids.   . COLONOSCOPY N/A 03/01/2017   Procedure: COLONOSCOPY;  Surgeon: Danie Binder, MD;  [facility];  Service: Endoscopy;  Laterality: N/A;  10:30 AM  . EYE SURGERY  05/2018   bilateral cataract surgery  . HERNIA REPAIR     Allergies  Allergen Reactions  . Dye Fdc Blue [Brilliant Blue Fcf (Fd&C Blue #1)]     PT UNSURE WHICH DYE  . Iodinated Diagnostic Agents Hives    30 YEARS AGO, recent 2021 epidural with contrast ok   No current facility-administered medications on file prior to encounter.    Current Outpatient Medications on File Prior to Encounter  Medication Sig Dispense Refill  . acetaminophen (TYLENOL) 500 MG tablet Take 1,000 mg by mouth 2 (two) times daily as needed for headache.    . allopurinol (ZYLOPRIM) 100 MG tablet Take 300 mg by mouth daily.     Marland Kitchen amLODipine (NORVASC) 10 MG tablet Take 10 mg by mouth daily.    Marland Kitchen amoxicillin-clavulanate (AUGMENTIN) 875-125 MG tablet Take 1 tablet by mouth every 12 (twelve) hours. (Patient not taking: Reported on 12/29/2020) 14 tablet 0  . atorvastatin (LIPITOR) 10 MG tablet TAKE 1 TABLET BY MOUTH EVERY DAY 30 tablet 0  . calcitRIOL (ROCALTROL) 0.25 MCG capsule Take 0.25 mcg by mouth at bedtime.     . clomiPHENE (CLOMID) 50 MG tablet Take 25 mg by mouth at bedtime.     . cyclobenzaprine (FLEXERIL) 5 MG tablet TAKE 1 TABLET BY MOUTH AT BEDTIME AS NEEDED FOR MUSCLE SPASMS. 30 tablet 3  . diclofenac Sodium (VOLTAREN) 1 % GEL Apply 2 g topically 4 (four) times daily. (Patient not taking: Reported on 12/29/2020) 50 g 0  . famciclovir (FAMVIR) 500 MG tablet Take 1,000 mg by mouth 2 (two) times daily as needed (fever blisters).  (Patient not taking: Reported on 12/29/2020)  5  . famotidine (PEPCID) 40 MG tablet TAKE 1 TABLET (40 MG TOTAL) BY MOUTH DAILY.  WITH BREAKFAST 90 tablet 3  . felodipine (PLENDIL) 10 MG 24 hr tablet Take 10 mg by mouth daily.    . fluticasone (FLONASE) 50 MCG/ACT nasal spray Place 1 spray into both nostrils daily for 14 days. 16 g 0  . furosemide (LASIX) 40 MG tablet Take 40 mg by mouth.    . gabapentin (NEURONTIN) 100 MG capsule TAKE 1 CAPSULE BY MOUTH THREE TIMES A DAY 90 capsule 2  . hydrALAZINE (APRESOLINE) 25 MG tablet Take 25 mg by mouth 3 (three) times daily.    Marland Kitchen HYDROcodone-acetaminophen (NORCO/VICODIN) 5-325 MG tablet Take 1 tablet by mouth every 6 (six) hours as needed. (Patient not taking: Reported on 12/29/2020) 8 tablet 0  . predniSONE (DELTASONE) 10 MG tablet Take 2 tablets (20 mg total) by mouth daily.  (Patient not taking: Reported on 12/29/2020) 15 tablet 0  . propranolol (INDERAL) 80 MG tablet TAKE 1 TABLET (80 MG TOTAL) BY MOUTH 2 (TWO) TIMES DAILY 180 tablet 1  . psyllium (METAMUCIL) 58.6 % powder Take 1 packet by mouth daily. As needed for constipation    . tamsulosin (FLOMAX) 0.4 MG CAPS capsule Take 0.4 mg by mouth daily.    . traZODone (DESYREL) 50 MG tablet TAKE 1 TABLET BY MOUTH EVERYDAY AT BEDTIME 90 tablet 2  . triamcinolone (KENALOG) 0.025 % ointment Apply 1 application topically 2 (two) times daily. (Patient not taking: Reported on 12/29/2020) 30 g 0  . vitamin C (ASCORBIC ACID) 500 MG tablet Take 500 mg by mouth 2 (two) times a week.  (Patient not taking: Reported on 12/29/2020)    . [DISCONTINUED] cetirizine (ZYRTEC ALLERGY) 10 MG tablet Take 1 tablet (10 mg total) by mouth daily. 30 tablet 0   Social History   Socioeconomic History  . Marital status: Married    Spouse name: Not on file  . Number of children: 0  . Years of education: Not on file  . Highest education level: Not on file  Occupational History  . 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 Smoking status: Never Smoker  . Smokeless tobacco: Never Used  . Tobacco comment: Never smoked  Vaping Use  . Vaping Use: Never used  Substance and Sexual Activity  . Alcohol use: No  . Drug use: No  . Sexual activity: Never  Other Topics Concern  . Not on file  Social History Narrative   NO KIDS. MARRIED TO 2ND WIFE.      02/06/2019: Lives with mother at this time assisting in her care, while wife lives nearby, taking care of her own parents.   Works from home full time, but planning on retiring in next month to focus on health/well-being   Used to work out on treadmill, but has not in recent months   Motivated to return to exercise regimine and better diet control    Enjoys watching basketball, football      Social Determinants of Health   Financial Resource Strain: Low Risk   . Difficulty of Paying Living Expenses: Not hard at all  Food Insecurity: No Food Insecurity  . Worried About Charity fundraiser in the Last Year: Never true  . Ran Out of Food in the Last Year: Never true  Transportation Needs: No Transportation Needs  . Lack of Transportation (Medical): No  .  Lack of Transportation (Non-Medical): No  Physical Activity: Inactive  . Days of Exercise per Week: 0 days  . Minutes of Exercise per Session: 0 min  Stress: No Stress Concern Present  . Feeling of Stress : Not at all  Social Connections: Socially Integrated  . Frequency of Communication with Friends and Family: More than three times a week  . Frequency of Social Gatherings with Friends and Family: More than three times a week  . Attends Religious Services: More than 4 times per year  . Active Member of Clubs or Organizations: Yes  . Attends Archivist Meetings: More than 4 times per year  . Marital Status: Married  Human resources officer Violence: Not At Risk  . Fear of Current or Ex-Partner: No  . Emotionally Abused: No  . Physically Abused: No  . Sexually Abused: No   Family History  Problem Relation Age of Onset  . Lung cancer Father   . High blood pressure Father   . Colon cancer Neg Hx   . Colon polyps Neg Hx     OBJECTIVE:    Vitals:   03/01/21 1739  BP: (!) 144/79  Pulse: 62  Resp: 18  Temp: 98.4 F (36.9 C)  TempSrc: Oral  SpO2: 94%    General appearance: alert; no distress Eyes: Right upper eyelid with redness and swelling, stye present; no conjunctival erythema. PERRL; EOMI without discomfort; some white drainage Neck: supple Lungs: normal respiratory effort Skin: warm and dry Psychological: alert and cooperative; normal mood and affect  ASSESSMENT & PLAN:  1. Hordeolum externum of right upper eyelid   2. Swelling of right upper eyelid      Meds ordered this encounter  Medications  . erythromycin ophthalmic ointment    Sig: Place a 1/2 inch ribbon of ointment onto the upper eyelid.    Dispense:  3.5 g    Refill:  0    Order Specific Question:   Supervising Provider    Answer:   Raylene Everts [[id number]]   Perform warm compresses at home.  Soak a wash cloth in warm (not scalding) water and place it over the eyes. As the wash cloth cools, it should be rewarmed and replaced for a total of 5 to 10 minutes of soaking time. Warm compresses should be applied two to four times a day as long as the patient has symptoms Perform lid washing: Either warm water or very dilute baby shampoo can be placed on a clean wash cloth, gauze pad, or cotton swab. Then be advised to gently clean along the lashes and lid margin to remove the accumulated material with care to avoid contacting the ocular surface. If shampoo is used, thorough rinsing is recommended. Vigorous washing should be avoided, as it may cause more irritation.  Prescribed erythromycin ointment.  Apply up to 6 times daily for 5-7 days, or until symptomatic improvement Follow up with ophthalmology for further evaluation and management if symptoms persists Return or go to ER if you have any new or worsening symptoms such as fever, chills, redness, swelling, eye pain, painful eye movements, vision changes, etc...  Reviewed expectations re: course of current medical issues. Questions answered. Outlined signs and symptoms indicating need for more acute intervention. Patient verbalized understanding. After Visit Summary given.   Lestine Box, PA-C 03/01/21 1821

## 2021-03-17 ENCOUNTER — Other Ambulatory Visit: Payer: Self-pay | Admitting: Family Medicine

## 2021-03-21 ENCOUNTER — Encounter: Payer: Self-pay | Admitting: Emergency Medicine

## 2021-03-21 ENCOUNTER — Other Ambulatory Visit: Payer: Self-pay

## 2021-03-21 ENCOUNTER — Ambulatory Visit
Admission: EM | Admit: 2021-03-21 | Discharge: 2021-03-21 | Disposition: A | Discharge status: Home / Self Care | Payer: Medicare Other | Attending: Internal Medicine | Admitting: Internal Medicine

## 2021-03-21 DIAGNOSIS — J069 Acute upper respiratory infection, unspecified: Secondary | ICD-10-CM

## 2021-03-21 LAB — POCT RAPID STREP A (OFFICE): Rapid Strep A Screen: NEGATIVE

## 2021-03-21 NOTE — ED Triage Notes (Signed)
Nasal congestion x 2 days.

## 2021-03-21 NOTE — ED Provider Notes (Signed)
[facility]    CSN: [id number] Arrival date & time: 03/21/21  1634      History   Chief Complaint No chief complaint on file.   HPI Leonidas Han Vejar. is a 70 y.o. male who presents with stuffy nose x 2 days. Has been using flonase and is still stuffy and his throat is sore. Had a fever of 100 this past weekend. None since yesterday.  Has not had Covid infection. Has received 2 of the covid injections   Past Medical History:  Diagnosis Date  . Adenoma 11/2009   1.1 cm from TCS  . DM (diabetes mellitus) ([place])   . GERD (gastroesophageal reflux disease)   . High cholesterol   . HTN (hypertension)   . Kidney stones   . Obesity (BMI 30-39.9) DEC 2011 223 LBS  . Rectal bleeding 2011   secondary to hemorrhoids    Patient Active Problem List   Diagnosis Date Noted  . Essential hypertension 09/07/2020  . Diet-controlled diabetes mellitus ([place]) 09/07/2020  . CKD (chronic kidney disease) stage 4, GFR 15-29 ml/min (HCC) 09/07/2020  . SOB (shortness of breath) on exertion 09/07/2020  . Foraminal stenosis of lumbar region 05/05/2020  . Colon adenomas   . Insomnia 01/11/2016  . Internal hemorrhoids with complication 42/59/5638  . Obstructive sleep apnea 07/27/2013  . Seasonal and perennial allergic rhinitis 07/27/2013  . Hx of colonic polyps 11/15/2012  . UNSPECIFIED DISEASE OF THE JAWS 03/11/2010  . COLONIC POLYPS, ADENOMATOUS 06/30/2009  . HYPERLIPIDEMIA 10/13/2008  . GLOBUS HYSTERICUS 10/13/2008  . GASTROESOPHAGEAL REFLUX DISEASE 10/13/2008  . RENAL CALCULUS 10/13/2008  . Dysphagia, idiopathic 10/13/2008  . ABDOMINAL PAIN 10/13/2008  . H N P-LUMBAR 06/23/2008  . SPINAL STENOSIS 06/23/2008  . Chronic midline low back pain without sciatica 06/23/2008    Past Surgical History:  Procedure Laterality Date  . CIRCUMCISION    . COLONOSCOPY  Dec 2010 [facility]   MOD IH, SIMPLE ADENOMA 1.1 CM  . COLONOSCOPY  11/25/2012   SLF: 1. Sessile polyp measuring 72mm in size  was found at the hepatic flesure; polypectomy was performed using snare cautery 2. Moderate diverticulosis was noted in the ascending colon and sigmoid colon. 3. The colon mucosa was otherwise normal 4. Large internal hemorrhoids.   . COLONOSCOPY N/A 03/01/2017   Procedure: COLONOSCOPY;  Surgeon: Danie Binder, MD;  Location: [facility];  Service: Endoscopy;  Laterality: N/A;  10:30 AM  . EYE SURGERY  05/2018   bilateral cataract surgery  . HERNIA REPAIR         Home Medications    Prior to Admission medications   Medication Sig Start Date End Date Taking? Authorizing Provider  acetaminophen (TYLENOL) 500 MG tablet Take 1,000 mg by mouth 2 (two) times daily as needed for headache.    [provider]  allopurinol (ZYLOPRIM) 100 MG tablet Take 300 mg by mouth daily.     [provider]  amLODipine (NORVASC) 10 MG tablet Take 10 mg by mouth daily.    [provider]  atorvastatin (LIPITOR) 10 MG tablet TAKE 1 TABLET BY MOUTH EVERY DAY (NEED PHYSICAL) 03/17/21   Billie Ruddy, MD  calcitRIOL (ROCALTROL) 0.25 MCG capsule Take 0.25 mcg by mouth at bedtime.     [provider]  clomiPHENE (CLOMID) 50 MG tablet Take 25 mg by mouth at bedtime.     [provider]  cyclobenzaprine (FLEXERIL) 5 MG tablet TAKE 1 TABLET BY MOUTH AT BEDTIME  AS NEEDED FOR MUSCLE SPASMS. 09/07/20   Billie Ruddy, MD  diclofenac Sodium (VOLTAREN) 1 % GEL Apply 2 g topically 4 (four) times daily. Patient not taking: Reported on 12/29/2020 03/23/20   Couture, Cortni S, PA-C  erythromycin ophthalmic ointment Place a 1/2 inch ribbon of ointment onto the upper eyelid. 03/01/21   Wurst, [place], PA-C  famciclovir (FAMVIR) 500 MG tablet Take 1,000 mg by mouth 2 (two) times daily as needed (fever blisters).  Patient not taking: Reported on 12/29/2020 02/19/17   [provider]  famotidine (PEPCID) 40 MG tablet TAKE 1 TABLET (40 MG TOTAL) BY MOUTH DAILY. WITH BREAKFAST  02/25/21   Carlis Stable, NP  felodipine (PLENDIL) 10 MG 24 hr tablet Take 10 mg by mouth daily.    [provider]  fluticasone (FLONASE) 50 MCG/ACT nasal spray Place 1 spray into both nostrils daily for 14 days. 09/29/20 10/13/20  Avegno, Darrelyn Hillock, FNP  furosemide (LASIX) 40 MG tablet Take 40 mg by mouth.    [provider]  gabapentin (NEURONTIN) 100 MG capsule TAKE 1 CAPSULE BY MOUTH THREE TIMES A DAY 01/20/21   Carole Civil, MD  hydrALAZINE (APRESOLINE) 25 MG tablet Take 25 mg by mouth 3 (three) times daily.    [provider]  HYDROcodone-acetaminophen (NORCO/VICODIN) 5-325 MG tablet Take 1 tablet by mouth every 6 (six) hours as needed. Patient not taking: Reported on 12/29/2020 03/23/20   Couture, Cortni S, PA-C  predniSONE (DELTASONE) 10 MG tablet Take 2 tablets (20 mg total) by mouth daily. Patient not taking: Reported on 12/29/2020 09/29/20   Emerson Monte, FNP  propranolol (INDERAL) 80 MG tablet TAKE 1 TABLET (80 MG TOTAL) BY MOUTH 2 (TWO) TIMES DAILY 02/04/21   Billie Ruddy, MD  psyllium (METAMUCIL) 58.6 % powder Take 1 packet by mouth daily. As needed for constipation    [provider]  tamsulosin (FLOMAX) 0.4 MG CAPS capsule Take 0.4 mg by mouth daily. 05/25/20   [provider]  traZODone (DESYREL) 50 MG tablet TAKE 1 TABLET BY MOUTH EVERYDAY AT BEDTIME 11/17/20   Young, Tarri Fuller D, MD  triamcinolone (KENALOG) 0.025 % ointment Apply 1 application topically 2 (two) times daily. Patient not taking: Reported on 12/29/2020 09/07/20   Billie Ruddy, MD  vitamin C (ASCORBIC ACID) 500 MG tablet Take 500 mg by mouth 2 (two) times a week.  Patient not taking: Reported on 12/29/2020    [provider]  cetirizine (ZYRTEC ALLERGY) 10 MG tablet Take 1 tablet (10 mg total) by mouth daily. 09/29/20 09/29/20  Emerson Monte, FNP    Family History Family History  Problem Relation Age of Onset  . Lung cancer Father   . High  blood pressure Father   . Colon cancer Neg Hx   . Colon polyps Neg Hx     Social History Social History   Tobacco Use  . Smoking status: Never Smoker  . Smokeless tobacco: Never Used  . Tobacco comment: Never smoked  Vaping Use  . Vaping Use: Never used  Substance Use Topics  . Alcohol use: No  . Drug use: No     Allergies   Dye fdc blue [brilliant blue fcf (fd&c blue #1)] and Iodinated diagnostic agents   Review of Systems Review of Systems  Constitutional: Positive for appetite change, chills, fatigue and fever. Negative for diaphoresis.  HENT: Positive for congestion, postnasal drip and sore throat. Negative for ear discharge, ear pain, sneezing and  trouble swallowing.   Eyes: Negative for discharge.  Respiratory: Negative for cough, chest tightness and shortness of breath.   Cardiovascular: Negative for chest pain.  Gastrointestinal: Positive for diarrhea. Negative for nausea and vomiting.       Had 2 watery BM's yesterday  Endocrine:       His glucose this am was 120  Musculoskeletal: Negative for myalgias.  Skin: Negative for rash.  Neurological: Negative for headaches.  Hematological: Negative for adenopathy.     Physical Exam Triage Vital Signs ED Triage Vitals  Enc Vitals Group     BP 03/21/21 1714 117/69     Pulse Rate 03/21/21 1714 66     Resp 03/21/21 1714 18     Temp 03/21/21 1714 (!) 97.5 F (36.4 C)     Temp Source 03/21/21 1714 Oral     SpO2 03/21/21 1714 93 %     Weight --      Height --      Head Circumference --      Peak Flow --      Pain Score 03/21/21 1713 0     Pain Loc --      Pain Edu? --      Excl. in [place]? --    No data found.  Updated Vital Signs BP 117/69 (BP Location: Right Arm)   Pulse 66   Temp (!) 97.5 F (36.4 C) (Oral)   Resp 18   SpO2 93%   Visual Acuity Right Eye Distance:   Left Eye Distance:   Bilateral Distance:    Right Eye Near:   Left Eye Near:    Bilateral Near:     Physical Exam Physical  Exam Vitals signs and nursing note reviewed.  Constitutional:      General: he is not in acute distress.    Appearance: Normal appearance. he is not ill-appearing, toxic-appearing or diaphoretic.  HENT:     Head: Normocephalic.     Right Ear: Tympanic membrane, ear canal and external ear normal.     Left Ear: Tympanic membrane, ear canal and external ear normal.     Nose: Nose normal.     Mouth/Throat:     Mouth: Mucous membranes are moist.  Eyes:     General: No scleral icterus.       Right eye: No discharge.        Left eye: No discharge.     Conjunctiva/sclera: Conjunctivae normal.  Neck:     Musculoskeletal: Neck supple. No neck rigidity.  Cardiovascular:     Rate and Rhythm: Normal rate and regular rhythm.     Heart sounds: No murmur.  Pulmonary:     Effort: Pulmonary effort is normal.     Breath sounds: Normal breath sounds.   Musculoskeletal: Normal range of motion.  Lymphadenopathy:     Cervical: No cervical adenopathy.  Skin:    General: Skin is warm and dry.     Coloration: Skin is not jaundiced.     Findings: No rash.  Neurological:     Mental Status: he is alert and oriented to person, place, and time.     Gait: Gait normal.  Psychiatric:        Mood and Affect: Mood normal.        Behavior: Behavior normal.        Thought Content: Thought content normal.        Judgment: Judgment normal.     UC Treatments / Results  Labs (  all labs ordered are listed, but only abnormal results are displayed) Labs Reviewed  POCT RAPID STREP A (OFFICE)   Rapid strep is neg EKG   Radiology No results found.  Procedures Procedures (including critical care time)  Medications Ordered in UC Medications - No data to display  Initial Impression / Assessment and Plan / UC Course  I have reviewed the triage vital signs and the nursing notes. Pertinent labs  results that were available during my care of the patient were reviewed by me and considered in my medical  decision making (see chart for details). He was advised to continue using the Flonase and may try saline nose rinses.   Final Clinical Impressions(s) / UC Diagnoses   Final diagnoses:  None   Discharge Instructions   None    ED Prescriptions    None     PDMP not reviewed this encounter.   Shelby Mattocks, [place] 03/22/21 [phone]

## 2021-03-31 ENCOUNTER — Other Ambulatory Visit: Payer: Self-pay

## 2021-04-01 ENCOUNTER — Ambulatory Visit (INDEPENDENT_AMBULATORY_CARE_PROVIDER_SITE_OTHER): Payer: Medicare Other | Admitting: Family Medicine

## 2021-04-01 ENCOUNTER — Encounter: Payer: Self-pay | Admitting: Family Medicine

## 2021-04-01 VITALS — BP 150/80 | HR 53 | Temp 97.6°F | Wt 209.8 lb

## 2021-04-01 DIAGNOSIS — E119 Type 2 diabetes mellitus without complications: Secondary | ICD-10-CM

## 2021-04-01 DIAGNOSIS — N184 Chronic kidney disease, stage 4 (severe): Secondary | ICD-10-CM

## 2021-04-01 DIAGNOSIS — I10 Essential (primary) hypertension: Secondary | ICD-10-CM

## 2021-04-01 NOTE — Progress Notes (Signed)
Subjective:    Patient ID: Bruce Gerold., male    DOB: 04-16-1951, 70 y.o.   MRN: [id number]  Chief Complaint  Patient presents with  . Follow-up    HPI Patient was seen today for f/u on CKD and update on overall health.  Pt states he has been doing well and feeling good.  Pt doing work around the house/for his mother.  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  Pt had recent f/u with nephrology for CKD IV.  Pt initially hesitant to consider renal transplant, but is now open to the idea.  Followed by [facility] txp.  Pt had a home visit with a nurse on 03/18/21 from his insurance company-has forms with him. PVD screening was negative, ABIs 1.25 in RLE and 1.35 in LLE.  Mini cog performed, pt able to recall 3 words.  Past Medical History:  Diagnosis Date  . Adenoma 11/2009   1.1 cm from TCS  . DM (diabetes mellitus) ([place])   . GERD (gastroesophageal reflux disease)   . High cholesterol   . HTN (hypertension)   . Kidney stones   . Obesity (BMI 30-39.9) DEC 2011 223 LBS  . Rectal bleeding 2011   secondary to hemorrhoids    Allergies  Allergen Reactions  . Dye Fdc Blue [Brilliant Blue Fcf (Fd&C Blue #1)]     PT UNSURE WHICH DYE  . Iodinated Diagnostic Agents Hives    30 YEARS AGO, recent 2021 epidural with contrast ok   Family History  Problem Relation Age of Onset  . Lung cancer Father   . High blood pressure Father   . Colon cancer Neg Hx   . Colon polyps Neg Hx      ROS General: Denies fever, chills, night sweats, changes in weight, changes in appetite HEENT: Denies headaches, ear pain, changes in vision, rhinorrhea, sore throat CV: Denies CP, palpitations, SOB, orthopnea Pulm: Denies SOB, cough, wheezing GI: Denies abdominal pain, nausea, vomiting, diarrhea, constipation GU: Denies dysuria, hematuria, frequency Msk: Denies muscle cramps, joint pains Neuro: Denies weakness, numbness, tingling Skin: Denies rashes, bruising Psych: Denies  depression, anxiety, hallucinations     Objective:    Blood pressure (!) 150/80, pulse (!) 53, temperature 97.6 F (36.4 C), temperature source Oral, weight 209 lb 12.8 oz (95.2 kg), SpO2 96 %.  Gen. Pleasant, well-nourished, in no distress, normal affect  HEENT: [place]/AT, face symmetric, conjunctiva clear, no scleral icterus, PERRLA, EOMI, nares patent without drainage Lungs: no accessory muscle use, CTAB, no wheezes or rales Cardiovascular: RRR, no m/r/g, no peripheral edema Abdomen: BS present, soft, NT/ND Musculoskeletal: No deformities, no cyanosis or clubbing, normal tone Neuro:  A&Ox3, CN II-XII intact, normal gait Skin:  Warm, no lesions/ rash   Wt Readings from Last 3 Encounters:  04/01/21 209 lb 12.8 oz (95.2 kg)  12/29/20 204 lb 9.6 oz (92.8 kg)  09/02/20 211 lb (95.7 kg)    Lab Results  Component Value Date   WBC 9.6 09/02/2020   HGB 11.6 (L) 09/02/2020   HCT 35.3 (L) 09/02/2020   PLT 222 09/02/2020   GLUCOSE 96 09/02/2020   CHOL 185 09/02/2020   TRIG 115 09/02/2020   HDL 46 09/02/2020   LDLCALC 116 (H) 09/02/2020   ALT 5 (L) 09/02/2020   AST 10 09/02/2020   NA 140 09/02/2020   K 4.4 09/02/2020   CL 108 09/02/2020   CREATININE 3.80 (H) 09/02/2020   BUN 39 (H) 09/02/2020  CO2 21 09/02/2020   PSA 1.41 01/27/2019   HGBA1C 5.9 (H) 09/02/2020   Assessment/Plan:  CKD (chronic kidney disease) stage 4, GFR 15-29 ml/min (HCC) -Stable -Recent labs done with specialist -Avoid nephrotoxic medications and renally dose medications -Awaiting determination if candidate/status for renal transplant -Continue follow-up with nephrology [facility] [place] [facility]  Essential hypertension -elevated.  Previously controlled. -likely 2/2 renal dz -Continue lifestyle modifications including sodium reduction -Continue current medications -Patient encouraged to check BP at home and keep a log to bring with him to clinic -For continued elevation will make  medication adjustments.  Diet-controlled diabetes mellitus ([place]) -Continue lifestyle modifications -Hemoglobin A1c 5.9% on 10/04/2020 -Continue to monitor  F/u prn in the next month for HTN, sooner if needed  Grier Mitts, MD

## 2021-04-08 ENCOUNTER — Other Ambulatory Visit: Payer: Self-pay | Admitting: Family Medicine

## 2021-04-08 LAB — BASIC METABOLIC PANEL
BUN: 42 — AB (ref 4–21)
CO2: 20 (ref 13–22)
Chloride: 104 (ref 99–108)
Creatinine: 3.8 — AB (ref 0.6–1.3)
Glucose: 114
Potassium: 4.4 (ref 3.4–5.3)
Sodium: 138 (ref 137–147)

## 2021-04-08 LAB — HEPATIC FUNCTION PANEL
ALT: 6 — AB (ref 10–40)
AST: 11 — AB (ref 14–40)

## 2021-04-08 LAB — COMPREHENSIVE METABOLIC PANEL: Calcium: 9.4 (ref 8.7–10.7)

## 2021-04-12 ENCOUNTER — Encounter: Payer: Self-pay | Admitting: Family Medicine

## 2021-04-12 ENCOUNTER — Other Ambulatory Visit: Payer: Self-pay | Admitting: Orthopedic Surgery

## 2021-04-12 DIAGNOSIS — M545 Low back pain, unspecified: Secondary | ICD-10-CM

## 2021-04-15 LAB — PSA: PSA: 1.1

## 2021-04-26 ENCOUNTER — Encounter: Payer: Self-pay | Admitting: Family Medicine

## 2021-05-03 ENCOUNTER — Other Ambulatory Visit: Payer: Self-pay | Admitting: Family Medicine

## 2021-05-06 ENCOUNTER — Encounter: Payer: Self-pay | Admitting: Family Medicine

## 2021-05-29 ENCOUNTER — Other Ambulatory Visit: Payer: Self-pay | Admitting: Family Medicine

## 2021-05-29 DIAGNOSIS — G8929 Other chronic pain: Secondary | ICD-10-CM

## 2021-06-17 ENCOUNTER — Ambulatory Visit: Payer: Medicare Other | Admitting: Internal Medicine

## 2021-07-13 NOTE — Progress Notes (Signed)
HPI M never smoker followed for OSA, complicated by GERD NPSG 04/15/13- moderate obstructive sleep apnea-AHI 22.5 per hour. CPAP 10 CWP. Weight 198 pounds.   ---------------------------------------------------------------------------------   05/20/2019- 70 year old male never smoker followed for OSA, insomnia, complicated by GERD, CKD4 CPAP auto 8-20/ [facility]            No DL available -----OSA, CPAP, DDU:KGURK. Patient reports he uses nightly with no complaints.  Body weight today 207 lbs He sleeps much better with CPAP, used as directed. He has 2 homes and alternates between 2 machines About to retire, anticipating easier sleeping, but does have trouble initiating sleep. Had used ambien occasionally. Sleep hygiene discussed.  Body weight today 211 lbs Trazodone, Gabapentin,  Covid vax- 2 Phizer Degenerative disc disease- sleeping more in daytime due to pain meds. Discussed sleep hygiene.   07/13/21- 70 year old male never smoker followed for OSA, insomnia, complicated by GERD, YHC6/ ESRD, Allergic Rhinitis, DM2,  -Gabapentin, Trazodone,       CPAP auto 8-20/ [facility]   Download- not available Body weight today-209 lbs Covid vax-  Ok with CPAP. Machine is old and we discussed replacement.  Back pain  due to disc disease treated with injection. On renal transplant waiting list.  CXR 09/03/20- IMPRESSION: No acute abnormality of the lungs  ROS-see HPI + = positive Constitutional:   No-   weight loss, night sweats, fevers, chills, +fatigue, lassitude. HEENT:   No-  headaches, difficulty swallowing, tooth/dental problems, sore throat,       No-  sneezing, itching, ear ache, nasal congestion, +post nasal drip,  CV:  No-   chest pain, orthopnea, PND, swelling in lower extremities, anasarca, dizziness, palpitations Resp: No-   shortness of breath with exertion or at rest.              No-   productive cough,  No non-productive cough,  No- coughing up of blood.              No-   change in  color of mucus.  No- wheezing.   Skin: No-   rash or lesions. GI:  No-   heartburn, indigestion, abdominal pain, nausea, vomiting,  GU:  MS:  No-   joint pain or swelling.  +back pain Neuro-     nothing unusual Psych:  No- change in mood or affect. No depression or anxiety.  No memory loss.  OBJ- Physical Exam General- Alert, Oriented, Affect-appropriate, Distress- none acute,  Skin- rash-none, lesions- none, excoriation- none Lymphadenopathy- none Head- atraumatic            Eyes- Gross vision intact, PERRLA, conjunctivae and secretions clear            Ears- Hearing, canals-normal            Nose-  sniffing,  turbinate edema, no-Septal dev, mucus, polyps, erosion, perforation             Throat- Mallampati III-IV , mucosa clear , drainage- none, tonsils- atrophic Neck- flexible , trachea midline, no stridor , thyroid nl, carotid no bruit Chest - symmetrical excursion , unlabored           Heart/CV- RRR , no murmur heard , no gallop  , no rub, nl s1 s2                           - JVD- none , edema- none, stasis changes- none, varices- none  Lung- clear to P&A, wheeze- none, cough- none , dullness-none, rub- none           Chest wall-  Abd-  Br/ Gen/ Rectal- Not done, not indicated Extrem- cyanosis- none, clubbing, none, atrophy- none, strength- nl Neuro- grossly intact to observation

## 2021-07-14 ENCOUNTER — Encounter: Payer: Self-pay | Admitting: Internal Medicine

## 2021-07-14 ENCOUNTER — Other Ambulatory Visit: Payer: Self-pay

## 2021-07-14 ENCOUNTER — Ambulatory Visit: Payer: Medicare Other | Admitting: Internal Medicine

## 2021-07-14 VITALS — BP 124/72 | HR 57 | Temp 98.1°F | Ht 70.0 in | Wt 209.8 lb

## 2021-07-14 DIAGNOSIS — G4733 Obstructive sleep apnea (adult) (pediatric): Secondary | ICD-10-CM | POA: Diagnosis not present

## 2021-07-14 DIAGNOSIS — N184 Chronic kidney disease, stage 4 (severe): Secondary | ICD-10-CM | POA: Diagnosis not present

## 2021-07-14 NOTE — Patient Instructions (Signed)
Order- DME [facility]- please replace old CPAP machine, auto 8-20, mask of choice, humidifier, supplies, AirView/ card  Please call if we can help

## 2021-07-14 NOTE — Assessment & Plan Note (Signed)
Benefits with good compliance and control Plan- replace old machine, auto 8-20

## 2021-07-14 NOTE — Assessment & Plan Note (Signed)
Says he is on transplant list

## 2021-07-18 ENCOUNTER — Other Ambulatory Visit: Payer: Self-pay | Admitting: Family Medicine

## 2021-07-26 ENCOUNTER — Other Ambulatory Visit: Payer: Self-pay | Admitting: Orthopedic Surgery

## 2021-07-26 DIAGNOSIS — M545 Low back pain, unspecified: Secondary | ICD-10-CM

## 2021-07-27 DIAGNOSIS — J310 Chronic rhinitis: Secondary | ICD-10-CM | POA: Insufficient documentation

## 2021-07-28 ENCOUNTER — Other Ambulatory Visit: Payer: Self-pay | Admitting: Family Medicine

## 2021-07-28 DIAGNOSIS — I10 Essential (primary) hypertension: Secondary | ICD-10-CM

## 2021-07-29 ENCOUNTER — Ambulatory Visit (INDEPENDENT_AMBULATORY_CARE_PROVIDER_SITE_OTHER): Payer: Medicare Other

## 2021-07-29 ENCOUNTER — Other Ambulatory Visit: Payer: Self-pay

## 2021-07-29 VITALS — BP 128/64 | HR 60 | Temp 98.1°F | Ht 69.0 in | Wt 190.0 lb

## 2021-07-29 DIAGNOSIS — Z0000 Encounter for general adult medical examination without abnormal findings: Secondary | ICD-10-CM | POA: Diagnosis not present

## 2021-07-29 NOTE — Patient Instructions (Signed)
Mr. Antonelli , Thank you for taking time to come for your Medicare Wellness Visit. I appreciate your ongoing commitment to your health goals. Please review the following plan we discussed and let me know if I can assist you in the future.   Screening recommendations/referrals: Colonoscopy    03/01/2017  due 2028 Recommended yearly ophthalmology/optometry visit for glaucoma screening and checkup Recommended yearly dental visit for hygiene and checkup  Vaccinations: Influenza vaccine: due in fall 2022  Pneumococcal vaccine: completed series  Tdap vaccine: due upon injury  Shingles vaccine:  will consider    Advanced directives:  will provide copies   Conditions/risks identified: none   Next appointment: CPE 08/24/2021   0900am  Dr.Banks   Preventive Care 39 Years and Older, Male Preventive care refers to lifestyle choices and visits with your health care provider that can promote health and wellness. What does preventive care include? A yearly physical exam. This is also called an annual well check. Dental exams once or twice a year. Routine eye exams. Ask your health care provider how often you should have your eyes checked. Personal lifestyle choices, including: Daily care of your teeth and gums. Regular physical activity. Eating a healthy diet. Avoiding tobacco and drug use. Limiting alcohol use. Practicing safe sex. Taking low doses of aspirin every day. Taking vitamin and mineral supplements as recommended by your health care provider. What happens during an annual well check? The services and screenings done by your health care provider during your annual well check will depend on your age, overall health, lifestyle risk factors, and family history of disease. Counseling  Your health care provider may ask you questions about your: Alcohol use. Tobacco use. Drug use. Emotional well-being. Home and relationship well-being. Sexual activity. Eating habits. History of  falls. Memory and ability to understand (cognition). Work and work Statistician. Screening  You may have the following tests or measurements: Height, weight, and BMI. Blood pressure. Lipid and cholesterol levels. These may be checked every 5 years, or more frequently if you are over 32 years old. Skin check. Lung cancer screening. You may have this screening every year starting at age 18 if you have a 30-pack-year history of smoking and currently smoke or have quit within the past 15 years. Fecal occult blood test (FOBT) of the stool. You may have this test every year starting at age 48. Flexible sigmoidoscopy or colonoscopy. You may have a sigmoidoscopy every 5 years or a colonoscopy every 10 years starting at age 3. Prostate cancer screening. Recommendations will vary depending on your family history and other risks. Hepatitis C blood test. Hepatitis B blood test. Sexually transmitted disease (STD) testing. Diabetes screening. This is done by checking your blood sugar (glucose) after you have not eaten for a while (fasting). You may have this done every 1-3 years. Abdominal aortic aneurysm (AAA) screening. You may need this if you are a current or former smoker. Osteoporosis. You may be screened starting at age 2 if you are at high risk. Talk with your health care provider about your test results, treatment options, and if necessary, the need for more tests. Vaccines  Your health care provider may recommend certain vaccines, such as: Influenza vaccine. This is recommended every year. Tetanus, diphtheria, and acellular pertussis (Tdap, Td) vaccine. You may need a Td booster every 10 years. Zoster vaccine. You may need this after age 47. Pneumococcal 13-valent conjugate (PCV13) vaccine. One dose is recommended after age 13. Pneumococcal polysaccharide (PPSV23) vaccine. One dose  is recommended after age 47. Talk to your health care provider about which screenings and vaccines you need and  how often you need them. This information is not intended to replace advice given to you by your health care provider. Make sure you discuss any questions you have with your health care provider. Document Released: 12/17/2015 Document Revised: 08/09/2016 Document Reviewed: 09/21/2015 Elsevier Interactive Patient Education  2017 [place] Prevention in the Home Falls can cause injuries. They can happen to people of all ages. There are many things you can do to make your home safe and to help prevent falls. What can I do on the outside of my home? Regularly fix the edges of walkways and driveways and fix any cracks. Remove anything that might make you trip as you walk through a door, such as a raised step or threshold. Trim any bushes or trees on the path to your home. Use bright outdoor lighting. Clear any walking paths of anything that might make someone trip, such as rocks or tools. Regularly check to see if handrails are loose or broken. Make sure that both sides of any steps have handrails. Any raised decks and porches should have guardrails on the edges. Have any leaves, snow, or ice cleared regularly. Use sand or salt on walking paths during winter. Clean up any spills in your garage right away. This includes oil or grease spills. What can I do in the bathroom? Use night lights. Install grab bars by the toilet and in the tub and shower. Do not use towel bars as grab bars. Use non-skid mats or decals in the tub or shower. If you need to sit down in the shower, use a plastic, non-slip stool. Keep the floor dry. Clean up any water that spills on the floor as soon as it happens. Remove soap buildup in the tub or shower regularly. Attach bath mats securely with double-sided non-slip rug tape. Do not have throw rugs and other things on the floor that can make you trip. What can I do in the bedroom? Use night lights. Make sure that you have a light by your bed that is easy to  reach. Do not use any sheets or blankets that are too big for your bed. They should not hang down onto the floor. Have a firm chair that has side arms. You can use this for support while you get dressed. Do not have throw rugs and other things on the floor that can make you trip. What can I do in the kitchen? Clean up any spills right away. Avoid walking on wet floors. Keep items that you use a lot in easy-to-reach places. If you need to reach something above you, use a strong step stool that has a grab bar. Keep electrical cords out of the way. Do not use floor polish or wax that makes floors slippery. If you must use wax, use non-skid floor wax. Do not have throw rugs and other things on the floor that can make you trip. What can I do with my stairs? Do not leave any items on the stairs. Make sure that there are handrails on both sides of the stairs and use them. Fix handrails that are broken or loose. Make sure that handrails are as long as the stairways. Check any carpeting to make sure that it is firmly attached to the stairs. Fix any carpet that is loose or worn. Avoid having throw rugs at the top or bottom of the stairs.  If you do have throw rugs, attach them to the floor with carpet tape. Make sure that you have a light switch at the top of the stairs and the bottom of the stairs. If you do not have them, ask someone to add them for you. What else can I do to help prevent falls? Wear shoes that: Do not have high heels. Have rubber bottoms. Are comfortable and fit you well. Are closed at the toe. Do not wear sandals. If you use a stepladder: Make sure that it is fully opened. Do not climb a closed stepladder. Make sure that both sides of the stepladder are locked into place. Ask someone to hold it for you, if possible. Clearly mark and make sure that you can see: Any grab bars or handrails. First and last steps. Where the edge of each step is. Use tools that help you move  around (mobility aids) if they are needed. These include: Canes. Walkers. Scooters. Crutches. Turn on the lights when you go into a dark area. Replace any light bulbs as soon as they burn out. Set up your furniture so you have a clear path. Avoid moving your furniture around. If any of your floors are uneven, fix them. If there are any pets around you, be aware of where they are. Review your medicines with your doctor. Some medicines can make you feel dizzy. This can increase your chance of falling. Ask your doctor what other things that you can do to help prevent falls. This information is not intended to replace advice given to you by your health care provider. Make sure you discuss any questions you have with your health care provider. Document Released: 09/16/2009 Document Revised: 04/27/2016 Document Reviewed: 12/25/2014 Elsevier Interactive Patient Education  2017 [employer].

## 2021-07-29 NOTE — Progress Notes (Signed)
Subjective:   Bruce Weber. is a 70 y.o. male who presents for an subsequent Medicare Annual Wellness Visit.  Review of Systems    N/a       Objective:    There were no vitals filed for this visit. There is no height or weight on file to calculate BMI.  Advanced Directives 08/13/2020 03/23/2020 01/27/2020 02/06/2019 03/01/2017 11/25/2012  Does Patient Have a Medical Advance Directive? Yes No Yes No Yes Patient has advance directive, copy not in chart  Type of Advance Directive [place];Living will - Living will - [place];Living will Living will  Does patient want to make changes to medical advance directive? No - Patient declined - Yes (ED - Information included in AVS) - - -  Copy of [place] in Chart? No - copy requested - - - No - copy requested -  Would patient like information on creating a medical advance directive? - - - No - Patient declined - -    Current Medications (verified) Outpatient Encounter Medications as of 07/29/2021  Medication Sig   acetaminophen (TYLENOL) 500 MG tablet Take 1,000 mg by mouth 2 (two) times daily as needed for headache.   allopurinol (ZYLOPRIM) 100 MG tablet Take 300 mg by mouth daily.    amLODipine (NORVASC) 10 MG tablet Take 10 mg by mouth daily.   atorvastatin (LIPITOR) 10 MG tablet TAKE 1 TABLET BY MOUTH EVERY DAY (NEED PHYSICAL)   calcitRIOL (ROCALTROL) 0.25 MCG capsule Take 0.25 mcg by mouth at bedtime.    clomiPHENE (CLOMID) 50 MG tablet Take 25 mg by mouth at bedtime.    cyclobenzaprine (FLEXERIL) 5 MG tablet TAKE 1 TABLET BY MOUTH AT BEDTIME AS NEEDED FOR MUSCLE SPASMS.   famciclovir (FAMVIR) 500 MG tablet Take 1,000 mg by mouth 2 (two) times daily as needed (fever blisters).   famotidine (PEPCID) 40 MG tablet TAKE 1 TABLET (40 MG TOTAL) BY MOUTH DAILY. WITH BREAKFAST   felodipine (PLENDIL) 10 MG 24 hr tablet Take 10 mg by mouth daily.   fluticasone (FLONASE) 50 MCG/ACT nasal  spray Place 1 spray into both nostrils daily for 14 days.   furosemide (LASIX) 40 MG tablet Take 40 mg by mouth.   gabapentin (NEURONTIN) 100 MG capsule TAKE 1 CAPSULE BY MOUTH THREE TIMES A DAY   hydrALAZINE (APRESOLINE) 25 MG tablet Take 25 mg by mouth 3 (three) times daily.   propranolol (INDERAL) 80 MG tablet TAKE 1 TABLET BY MOUTH 2 TIMES DAILY.   psyllium (METAMUCIL) 58.6 % powder Take 1 packet by mouth daily. As needed for constipation   tamsulosin (FLOMAX) 0.4 MG CAPS capsule Take 0.4 mg by mouth daily.   traZODone (DESYREL) 50 MG tablet TAKE 1 TABLET BY MOUTH EVERYDAY AT BEDTIME   triamcinolone (KENALOG) 0.025 % ointment Apply 1 application topically 2 (two) times daily.   vitamin C (ASCORBIC ACID) 500 MG tablet Take 500 mg by mouth 2 (two) times a week.   [DISCONTINUED] cetirizine (ZYRTEC ALLERGY) 10 MG tablet Take 1 tablet (10 mg total) by mouth daily.   No facility-administered encounter medications on file as of 07/29/2021.    Allergies (verified) Dye fdc blue [brilliant blue fcf (fd&c blue #1)] and Iodinated diagnostic agents   History: Past Medical History:  Diagnosis Date   Adenoma 11/2009   1.1 cm from TCS   DM (diabetes mellitus) ([place])    GERD (gastroesophageal reflux disease)    High cholesterol  HTN (hypertension)    Kidney stones    Obesity (BMI 30-39.9) DEC 2011 223 LBS   Rectal bleeding 2011   secondary to hemorrhoids   Past Surgical History:  Procedure Laterality Date   CIRCUMCISION     COLONOSCOPY  Dec 2010 [facility]   MOD IH, SIMPLE ADENOMA 1.1 CM   COLONOSCOPY  11/25/2012   SLF: 1. Sessile polyp measuring 62mm in size was found at the hepatic flesure; polypectomy was performed using snare cautery 2. Moderate diverticulosis was noted in the ascending colon and sigmoid colon. 3. The colon mucosa was otherwise normal 4. Large internal hemorrhoids.    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 MARRIED TO 2ND WIFE.      02/06/2019: Lives with mother at this time assisting in her care, while wife lives nearby, taking care of her own parents.   Works from home full time, but planning on retiring in next month to focus on health/well-being   Used to work out on treadmill, but has not in recent months   Motivated to return to exercise regimine and better diet control   Enjoys watching basketball, football      Social Determinants of Radio broadcast assistant Strain: Low Risk    Difficulty of Paying Living Expenses: Not hard at all  Food Insecurity: No Food Insecurity   Worried About Charity fundraiser in the Last Year: Never true   Arboriculturist in the Last Year: Never true  Transportation Needs: No Transportation Needs   Lack of Transportation (Medical): No   Lack of Transportation (Non-Medical): No  Physical Activity: Inactive   Days of Exercise per Week: 0 days   Minutes of Exercise per Session: 0 min  Stress:  No Stress Concern Present   Feeling of Stress : Not at all  Social Connections: Socially Integrated   Frequency of Communication with Friends and Family: More than three times a week   Frequency of Social Gatherings with Friends and Family: More than three times a week   Attends Religious Services: More than 4 times per year   Active Member of [employer] or Organizations: Yes   Attends Club or  Organization Meetings: More than 4 times per year   Marital Status: Married    Tobacco Counseling Counseling given: Not Answered Tobacco comments: Never smoked   Clinical Intake:                 Diabetic?yes Nutrition Risk Assessment:  Has the patient had any N/V/D within the last 2 months?  No  Does the patient have any non-healing wounds?  No  Has the patient had any unintentional weight loss or weight gain?  No   Diabetes:  Is the patient diabetic?  Yes  If diabetic, was a CBG obtained today?  No  Did the patient bring in their glucometer from home?  No  How often do you monitor your CBG's? Daily .   Financial Strains and Diabetes Management:  Are you having any financial strains with the device, your supplies or your medication? No .  Does the patient want to be seen by Chronic Care Management for management of their diabetes?  No  Would the patient like to be referred to a Nutritionist or for Diabetic Management?  No   Diabetic Exams:  Diabetic Eye Exam: Completed 10/2020 Diabetic Foot Exam: Overdue, Pt has been advised about the importance in completing this exam. Pt is scheduled for diabetic foot exam on next office visit .          Activities of Daily Living In your present state of health, do you have any difficulty performing the following activities: 08/13/2020  Hearing? N  Vision? N  Difficulty concentrating or making decisions? N  Walking or climbing stairs? Y  Comment States has issues with back pain when climbing stairs  Dressing or bathing? N  Doing  errands, shopping? N  Preparing Food and eating ? N  Using the Toilet? N  In the past six months, have you accidently leaked urine? N  Do you have problems with loss of bowel control? N  Managing your Medications? N  Managing your Finances? N  Housekeeping or managing your Housekeeping? N  Some recent data might be hidden    Patient Care Team: Billie Ruddy, MD as PCP - General (Family Medicine) Danie Binder, MD (Inactive) (Gastroenterology) Deterding, Jeneen Rinks, MD as Consulting Physician (Nephrology) Rutherford Guys, MD as Consulting Physician (Ophthalmology) Deneise Lever, MD as Consulting Physician (Pulmonary Disease) Eloise Harman, DO as Consulting Physician (Internal Medicine)  Indicate any recent Medical Services you may have received from other than [facility] providers in the past year (date may be approximate).     Assessment:   This is a routine wellness examination for Bruce Weber.  Hearing/Vision screen No results found.  Dietary issues and exercise activities discussed:     Goals Addressed   None    Depression Screen PHQ 2/9 Scores 08/13/2020 02/06/2019 09/11/2018 06/17/2018 02/20/2018 11/28/2017  PHQ - 2 Score 0 0 0 0 0 0  PHQ- 9 Score 0 2 - - - -    Fall Risk Fall Risk  08/13/2020 02/06/2019 09/11/2018 06/17/2018 02/20/2018  Falls in the past year? 1 0 No No No  Number falls in past yr: 0 - - - -  Injury with Fall? 0 - - - -  Risk for fall due to : History of fall(s);Medication side effect Medication side effect - - -  Follow up Falls evaluation completed;Falls prevention discussed - - - -    FALL RISK PREVENTION PERTAINING TO THE HOME:  Any stairs in or around the home? Yes  If  so, are there any without handrails? Yes  Home free of loose throw rugs in walkways, pet beds, electrical cords, etc? Yes  Adequate lighting in your home to reduce risk of falls? Yes   ASSISTIVE DEVICES UTILIZED TO PREVENT FALLS:  Life alert? No  Use of a cane, walker or w/c? No  Grab  bars in the bathroom? Yes  Shower chair or bench in shower? Yes  Elevated toilet seat or a handicapped toilet? No   TIMED UP AND GO:  Was the test performed? Yes .  Length of time to ambulate 10 feet: 10 sec.   Gait steady and fast without use of assistive device  Cognitive Function:    Normal cognitive status assessed by direct observation by this Nurse Health Advisor. No abnormalities found.      Immunizations Immunization History  Administered Date(s) Administered   Fluad Quad(high Dose 65+) 08/13/2020   Influenza Split 08/04/2012, 08/04/2014, 09/01/2015   Influenza Whole 08/05/2013   Influenza-Unspecified 08/18/2018   PFIZER Comirnaty(Gray Top)Covid-19 Tri-Sucrose Vaccine 01/29/2020, 02/25/2020, 10/21/2020, 03/25/2021   PFIZER(Purple Top)SARS-COV-2 Vaccination 01/29/2020, 02/25/2020   Pneumococcal Conjugate-13 02/06/2019   Pneumococcal Polysaccharide-23 08/13/2020   Zoster, Live 08/05/2013    TDAP status: Due, Education has been provided regarding the importance of this vaccine. Advised may receive this vaccine at local pharmacy or Health Dept. Aware to provide a copy of the vaccination record if obtained from local pharmacy or Health Dept. Verbalized acceptance and understanding.  Flu Vaccine status: Up to date  Pneumococcal vaccine status: Up to date  Covid-19 vaccine status: Completed vaccines  Qualifies for Shingles Vaccine? Yes   Zostavax completed No   Shingrix Completed?: No.    Education has been provided regarding the importance of this vaccine. Patient has been advised to call insurance company to determine out of pocket expense if they have not yet received this vaccine. Advised may also receive vaccine at local pharmacy or Health Dept. Verbalized acceptance and understanding.  Screening Tests Health Maintenance  Topic Date Due   Hepatitis C Screening  Never done   TETANUS/TDAP  Never done   Zoster Vaccines- Shingrix (1 of 2) Never done   OPHTHALMOLOGY  EXAM  07/05/2019   FOOT EXAM  08/05/2019   HEMOGLOBIN A1C  03/02/2021   INFLUENZA VACCINE  07/04/2021   COLONOSCOPY (Pts 45-7yrs Insurance coverage will need to be confirmed)  03/02/2027   COVID-19 Vaccine  Completed   PNA vac Low Risk Adult  Completed   HPV VACCINES  Aged Out    Health Maintenance  Health Maintenance Due  Topic Date Due   Hepatitis C Screening  Never done   TETANUS/TDAP  Never done   Zoster Vaccines- Shingrix (1 of 2) Never done   OPHTHALMOLOGY EXAM  07/05/2019   FOOT EXAM  08/05/2019   HEMOGLOBIN A1C  03/02/2021   INFLUENZA VACCINE  07/04/2021    Colorectal cancer screening: Type of screening: Colonoscopy. Completed 03/01/2017. Repeat every 10 years  Lung Cancer Screening: (Low Dose CT Chest recommended if Age 42-80 years, 30 pack-year currently smoking OR have quit w/in 15years.) does not qualify.   Lung Cancer Screening Referral: n/a  Additional Screening:  Hepatitis C Screening: does qualify;  Vision Screening: Recommended annual ophthalmology exams for early detection of glaucoma and other disorders of the eye. Is the patient up to date with their annual eye exam?  Yes  Who is the provider or what is the name of the office in which the patient attends annual eye  exams? Dr.Shipiro If pt is not established with a provider, would they like to be referred to a provider to establish care? No .   Dental Screening: Recommended annual dental exams for proper oral hygiene  Community Resource Referral / Chronic Care Management: CRR required this visit?  No   CCM required this visit?  No      Plan:     I have personally reviewed and noted the following in the patient's chart:   Medical and social history Use of alcohol, tobacco or illicit drugs  Current medications and supplements including opioid prescriptions. Patient is not currently taking opioid prescriptions. Functional ability and status Nutritional status Physical activity Advanced  directives List of other physicians Hospitalizations, surgeries, and ER visits in previous 12 months Vitals Screenings to include cognitive, depression, and falls Referrals and appointments  In addition, I have reviewed and discussed with patient certain preventive protocols, quality metrics, and best practice recommendations. A written personalized care plan for preventive services as well as general preventive health recommendations were provided to patient.     Randel Pigg, LPN   03/28/9562   Nurse Notes: none

## 2021-08-23 ENCOUNTER — Other Ambulatory Visit: Payer: Self-pay

## 2021-08-24 ENCOUNTER — Encounter: Payer: Self-pay | Admitting: Family Medicine

## 2021-08-24 ENCOUNTER — Ambulatory Visit (INDEPENDENT_AMBULATORY_CARE_PROVIDER_SITE_OTHER): Payer: Medicare Other | Admitting: Family Medicine

## 2021-08-24 VITALS — BP 130/86 | HR 58 | Temp 98.4°F | Ht 69.0 in | Wt 208.2 lb

## 2021-08-24 DIAGNOSIS — E782 Mixed hyperlipidemia: Secondary | ICD-10-CM

## 2021-08-24 DIAGNOSIS — Z0000 Encounter for general adult medical examination without abnormal findings: Secondary | ICD-10-CM

## 2021-08-24 DIAGNOSIS — E119 Type 2 diabetes mellitus without complications: Secondary | ICD-10-CM

## 2021-08-24 DIAGNOSIS — I10 Essential (primary) hypertension: Secondary | ICD-10-CM

## 2021-08-24 DIAGNOSIS — N184 Chronic kidney disease, stage 4 (severe): Secondary | ICD-10-CM | POA: Diagnosis not present

## 2021-08-24 LAB — LIPID PANEL
Cholesterol: 166 mg/dL (ref 0–200)
HDL: 56.2 mg/dL (ref >39.00)
LDL Cholesterol: 97 mg/dL (ref 0–99)
NonHDL: 109.72
Total CHOL/HDL Ratio: 3
Triglycerides: 65 mg/dL (ref 0.0–149.0)
VLDL: 13 mg/dL (ref 0.0–40.0)

## 2021-08-24 LAB — CBC WITH DIFFERENTIAL/PLATELET
Basophils Absolute: 0 10*3/uL (ref 0.0–0.1)
Basophils Relative: 0.5 % (ref 0.0–3.0)
Eosinophils Absolute: 0.3 10*3/uL (ref 0.0–0.7)
Eosinophils Relative: 2.5 % (ref 0.0–5.0)
HCT: 36.5 % — ABNORMAL LOW (ref 39.0–52.0)
Hemoglobin: 11.9 g/dL — ABNORMAL LOW (ref 13.0–17.0)
Lymphocytes Relative: 11.3 % — ABNORMAL LOW (ref 12.0–46.0)
Lymphs Abs: 1.2 10*3/uL (ref 0.7–4.0)
MCHC: 32.5 g/dL (ref 30.0–36.0)
MCV: 99.4 fl (ref 78.0–100.0)
Monocytes Absolute: 0.7 10*3/uL (ref 0.1–1.0)
Monocytes Relative: 6.6 % (ref 3.0–12.0)
Neutro Abs: 8.1 10*3/uL — ABNORMAL HIGH (ref 1.4–7.7)
Neutrophils Relative %: 79.1 % — ABNORMAL HIGH (ref 43.0–77.0)
Platelets: 208 10*3/uL (ref 150.0–400.0)
RBC: 3.67 Mil/uL — ABNORMAL LOW (ref 4.22–5.81)
RDW: 16.1 % — ABNORMAL HIGH (ref 11.5–15.5)
WBC: 10.3 10*3/uL (ref 4.0–10.5)

## 2021-08-24 LAB — COMPREHENSIVE METABOLIC PANEL
ALT: 8 U/L (ref 0–53)
AST: 11 U/L (ref 0–37)
Albumin: 4.1 g/dL (ref 3.5–5.2)
Alkaline Phosphatase: 64 U/L (ref 39–117)
BUN: 31 mg/dL — ABNORMAL HIGH (ref 6–23)
CO2: 25 mEq/L (ref 19–32)
Calcium: 9.3 mg/dL (ref 8.4–10.5)
Chloride: 107 mEq/L (ref 96–112)
Creatinine, Ser: 3.69 mg/dL — ABNORMAL HIGH (ref 0.40–1.50)
GFR: 15.73 mL/min — ABNORMAL LOW (ref >60.00)
Glucose, Bld: 103 mg/dL — ABNORMAL HIGH (ref 70–99)
Potassium: 4.5 mEq/L (ref 3.5–5.1)
Sodium: 142 mEq/L (ref 135–145)
Total Bilirubin: 0.5 mg/dL (ref 0.2–1.2)
Total Protein: 7.1 g/dL (ref 6.0–8.3)

## 2021-08-24 LAB — TSH: TSH: 2.03 u[IU]/mL (ref 0.35–5.50)

## 2021-08-24 LAB — T4, FREE: Free T4: 0.71 ng/dL (ref 0.60–1.60)

## 2021-08-24 LAB — HEMOGLOBIN A1C: Hgb A1c MFr Bld: 5.6 % (ref 4.6–6.5)

## 2021-08-24 NOTE — Patient Instructions (Signed)
Per chart review it appears he had 1 dose of zoster vaccine on 08/05/2013.  This is the vaccination against shingles.  It is a 2 dose series.  You should schedule your second dose.

## 2021-08-24 NOTE — Progress Notes (Signed)
Subjective:     Bruce Weber. is a 70 y.o. male and is here for a comprehensive physical exam.  Pt doing well for the most part. Staying active.  Caring for his 11 yo mother who is still driving.  Pt on the renal txp list with [facility].  Had an appt last Wed with [facility].  Had some labs at that appt and influenza vaccine.  Pt endorses recent foot exam.   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 MARRIED TO 2ND WIFE.      02/06/2019: Lives with mother at this time assisting in her care, while wife lives nearby, taking care of her own parents.   Works from home full time, but planning on retiring in next month to focus on health/well-being   Used to work out on treadmill, but has not in recent months   Motivated to return to exercise regimine and better diet control   Enjoys watching basketball, football      Social Determinants of Radio broadcast assistant Strain: Not on file  Food Insecurity: Not on file  Transportation Needs: No Transportation Needs   Lack of Transportation (Medical): No   Lack of Transportation (Non-Medical): No  Physical Activity: Inactive   Days of Exercise per Week: 0 days   Minutes of Exercise per Session: 0 min  Stress: Not on file  Social Connections: Socially Integrated   Frequency of Communication  with Friends and Family: Three times a week   Frequency of Social Gatherings with Friends and Family: Three times a week   Attends Religious Services: More than 4 times per year   Active Member of Clubs or Organizations: Yes   Attends Music therapist: More than 4 times per year   Marital Status: Married  Human resources officer Violence: Not At Risk   Fear of Current or Ex-Partner: No   Emotionally Abused: No   Physically Abused: No   Sexually Abused: No   Health Maintenance  Topic Date Due   Hepatitis C Screening  Never done   TETANUS/TDAP  Never done   Zoster Vaccines- Shingrix (1 of 2) Never done   OPHTHALMOLOGY EXAM  07/05/2019   FOOT EXAM  08/05/2019   HEMOGLOBIN A1C  03/02/2021   INFLUENZA VACCINE  07/04/2021   COLONOSCOPY (Pts 45-65yrs Insurance coverage will need to be confirmed)  03/02/2027   COVID-19 Vaccine  Completed   HPV VACCINES  Aged Out    The following portions of the patient's history were reviewed and updated as appropriate: allergies, current medications, past family history, past medical history, past social history, past surgical history, and problem list.  Review of Systems Pertinent  items noted in HPI and remainder of comprehensive ROS otherwise negative.   Objective:    BP 130/86 (BP Location: Right Arm, Patient Position: Sitting, Cuff Size: Normal)   Pulse (!) 58   Temp 98.4 F (36.9 C) (Oral)   Ht 5\' 9"  (1.753 m)   Wt 208 lb 3.2 oz (94.4 kg)   SpO2 95%   BMI 30.75 kg/m  General appearance: alert, cooperative, and no distress Head: Normocephalic, without obvious abnormality, atraumatic Eyes: conjunctivae/corneas clear. PERRL, EOM's intact. Fundi benign. Ears: normal TM's and external ear canals both ears Nose: Nares normal. Septum midline. Mucosa normal. No drainage or sinus tenderness. Throat: lips, mucosa, and tongue normal; teeth and gums normal Neck: no adenopathy, no carotid bruit, no JVD, supple, symmetrical, trachea midline, and  thyroid not enlarged, symmetric, no tenderness/mass/nodules Lungs: clear to auscultation bilaterally Heart: regular rate and rhythm, S1, S2 normal, no murmur, click, rub or gallop Abdomen: soft, non-tender; bowel sounds normal; no masses,  no organomegaly Extremities: extremities normal, atraumatic, no cyanosis or edema Pulses: 2+ and symmetric Skin: Skin color, texture, turgor normal. Hypopigmented lesions on skin of upper ches midline at sternum. Lymph nodes: Cervical, supraclavicular, and axillary nodes normal. Neurologic: Alert and oriented X 3, normal strength and tone. Normal symmetric reflexes. Normal coordination and gait    Assessment:    Healthy male exam.      Plan:    Anticipatory guidance given including wearing seatbelts, smoke detectors in the home, increasing physical activity, increasing p.o. intake of water and vegetables. -labs -Colonoscopy up-to-date done 03/01/2017 -PSA checked and normal on 04/15/2021 -Influenza vaccine given last at neurology appointment -Foot exam up-to-date per patient -Given handout -Next CPE in 1 year See After Visit Summary for Counseling Recommendations   CKD (chronic kidney disease) stage 4, GFR 15-29 ml/min (HCC)  -Stable -Continue follow-up with [facility] -Continue to avoid nephrotoxic medications and renally dose medications -On renal transplant list with [facility]: CMP  Diet-controlled diabetes mellitus ([place])  -Continue lifestyle modifications -Continue checking FSBS -Foot exam done recently.  Patient - Plan: Hemoglobin A1c, Lipid panel  Mixed hyperlipidemia -continue lifestyle modifications -continue lipitor 10 mg - Plan: Lipid panel  Hypertension -Controlled -Continue current medications including propranolol 80 mg twice daily -Continue lifestyle modifications  F/u in 4-6 months, sooner if needed  Grier Mitts, MD

## 2021-09-02 ENCOUNTER — Other Ambulatory Visit: Payer: Self-pay | Admitting: Internal Medicine

## 2021-09-02 NOTE — Telephone Encounter (Signed)
Dr. Annamaria Boots, please advise if you are okay refilling med.  Allergies  Allergen Reactions   Dye Fdc Blue [Brilliant Blue Fcf (Fd&C Blue #1)]     PT UNSURE WHICH DYE   Iodinated Diagnostic Agents Hives    30 YEARS AGO, recent 2021 epidural with contrast ok     Current Outpatient Medications:    acetaminophen (TYLENOL) 500 MG tablet, Take 1,000 mg by mouth 2 (two) times daily as needed for headache., Disp: , Rfl:    allopurinol (ZYLOPRIM) 100 MG tablet, Take 300 mg by mouth daily. , Disp: , Rfl:    amLODipine (NORVASC) 10 MG tablet, Take 10 mg by mouth daily., Disp: , Rfl:    atorvastatin (LIPITOR) 10 MG tablet, TAKE 1 TABLET BY MOUTH EVERY DAY (NEED PHYSICAL), Disp: 60 tablet, Rfl: 0   calcitRIOL (ROCALTROL) 0.25 MCG capsule, Take 0.25 mcg by mouth at bedtime. , Disp: , Rfl:    clomiPHENE (CLOMID) 50 MG tablet, Take 25 mg by mouth at bedtime. , Disp: , Rfl:    cyclobenzaprine (FLEXERIL) 5 MG tablet, TAKE 1 TABLET BY MOUTH AT BEDTIME AS NEEDED FOR MUSCLE SPASMS., Disp: 30 tablet, Rfl: 3   famciclovir (FAMVIR) 500 MG tablet, Take 1,000 mg by mouth 2 (two) times daily as needed (fever blisters). (Patient not taking: Reported on 08/24/2021), Disp: , Rfl: 5   famotidine (PEPCID) 40 MG tablet, TAKE 1 TABLET (40 MG TOTAL) BY MOUTH DAILY. WITH BREAKFAST, Disp: 90 tablet, Rfl: 3   felodipine (PLENDIL) 10 MG 24 hr tablet, Take 10 mg by mouth daily., Disp: , Rfl:    fluticasone (FLONASE) 50 MCG/ACT nasal spray, Place 1 spray into both nostrils daily for 14 days., Disp: 16 g, Rfl: 0   furosemide (LASIX) 40 MG tablet, Take 40 mg by mouth., Disp: , Rfl:    gabapentin (NEURONTIN) 100 MG capsule, TAKE 1 CAPSULE BY MOUTH THREE TIMES A DAY, Disp: 90 capsule, Rfl: 2   hydrALAZINE (APRESOLINE) 25 MG tablet, Take 25 mg by mouth 3 (three) times daily., Disp: , Rfl:    propranolol (INDERAL) 80 MG tablet, TAKE 1 TABLET BY MOUTH 2 TIMES DAILY., Disp: 180 tablet, Rfl: 0   psyllium (METAMUCIL) 58.6 % powder, Take 1  packet by mouth daily. As needed for constipation, Disp: , Rfl:    tamsulosin (FLOMAX) 0.4 MG CAPS capsule, Take 0.4 mg by mouth daily., Disp: , Rfl:    traZODone (DESYREL) 50 MG tablet, TAKE 1 TABLET BY MOUTH EVERYDAY AT BEDTIME, Disp: 90 tablet, Rfl: 2   triamcinolone (KENALOG) 0.025 % ointment, Apply 1 application topically 2 (two) times daily., Disp: 30 g, Rfl: 0   vitamin C (ASCORBIC ACID) 500 MG tablet, Take 500 mg by mouth 2 (two) times a week., Disp: , Rfl:

## 2021-09-02 NOTE — Telephone Encounter (Signed)
Trazodone refilled.

## 2021-09-09 ENCOUNTER — Other Ambulatory Visit: Payer: Self-pay | Admitting: Family Medicine

## 2021-09-20 ENCOUNTER — Telehealth: Payer: Self-pay | Admitting: Physical Medicine and Rehabilitation

## 2021-09-20 NOTE — Telephone Encounter (Signed)
Patient called. He would like an appointment with Dr. Ernestina Patches. His call back number is [phone]

## 2021-09-20 NOTE — Telephone Encounter (Signed)
Left L3 TF on 07/08/2020. Ok to repeat if helped, same problem/side, and no new injury?

## 2021-10-05 LAB — CBC AND DIFFERENTIAL
HCT: 35 — AB (ref 41–53)
Hemoglobin: 12.3 — AB (ref 13.5–17.5)
Platelets: 196 (ref 150–399)
WBC: 8.7

## 2021-10-05 LAB — HEPATIC FUNCTION PANEL
ALT: 12 (ref 10–40)
AST: 13 — AB (ref 14–40)

## 2021-10-05 LAB — BASIC METABOLIC PANEL
BUN: 39 — AB (ref 4–21)
CO2: 18 (ref 13–22)
Chloride: 105 (ref 99–108)
Creatinine: 4.3 — AB (ref 0.6–1.3)
Glucose: 104
Potassium: 4.4 (ref 3.4–5.3)
Sodium: 139 (ref 137–147)

## 2021-10-05 LAB — CBC: RBC: 3.77 — AB (ref 3.87–5.11)

## 2021-10-05 LAB — COMPREHENSIVE METABOLIC PANEL
Albumin: 4.2 (ref 3.5–5.0)
Calcium: 9.1 (ref 8.7–10.7)
Globulin: 2.5

## 2021-10-05 LAB — IRON,TIBC AND FERRITIN PANEL
Ferritin: 82
Iron: 68
UIBC: 241

## 2021-10-13 ENCOUNTER — Ambulatory Visit: Payer: Self-pay

## 2021-10-13 ENCOUNTER — Encounter: Payer: Self-pay | Admitting: Physical Medicine and Rehabilitation

## 2021-10-13 ENCOUNTER — Ambulatory Visit (INDEPENDENT_AMBULATORY_CARE_PROVIDER_SITE_OTHER): Payer: Medicare Other | Admitting: Physical Medicine and Rehabilitation

## 2021-10-13 ENCOUNTER — Other Ambulatory Visit: Payer: Self-pay

## 2021-10-13 ENCOUNTER — Encounter: Payer: Self-pay | Admitting: Family Medicine

## 2021-10-13 VITALS — BP 160/78 | HR 58

## 2021-10-13 DIAGNOSIS — M48061 Spinal stenosis, lumbar region without neurogenic claudication: Secondary | ICD-10-CM

## 2021-10-13 DIAGNOSIS — M5416 Radiculopathy, lumbar region: Secondary | ICD-10-CM

## 2021-10-13 NOTE — Patient Instructions (Signed)

## 2021-10-13 NOTE — Progress Notes (Signed)
Pt state lower back pain that travels to his right groin area. Pt state walking makes the pain worse. Pt state he takes pain meds to help ease his pain. Pt has hx of inj on 07/08/21 pt state it helped.  Numeric Pain Rating Scale and Functional Assessment Average Pain 3   In the last MONTH (on 0-10 scale) has pain interfered with the following?  1. General activity like being  able to carry out your everyday physical activities such as walking, climbing stairs, carrying groceries, or moving a chair?  Rating(6)   +Driver, -BT, -Dye Allergies.

## 2021-10-20 ENCOUNTER — Other Ambulatory Visit: Payer: Self-pay | Admitting: Orthopedic Surgery

## 2021-10-20 DIAGNOSIS — M545 Low back pain, unspecified: Secondary | ICD-10-CM

## 2021-10-22 ENCOUNTER — Other Ambulatory Visit: Payer: Self-pay | Admitting: Family Medicine

## 2021-10-22 DIAGNOSIS — I10 Essential (primary) hypertension: Secondary | ICD-10-CM

## 2021-10-30 NOTE — Progress Notes (Signed)
Bruce Weber. - 70 y.o. male MRN [id number]  Date of birth: 1951-03-20  Office Visit Note: Visit Date: 10/13/2021 PCP: Billie Ruddy, MD Referred by: Billie Ruddy, MD  Subjective: Chief Complaint  Patient presents with   Lower Back - Pain   HPI:  Bruce Abel Hageman. is a 70 y.o. male who comes in today at the request of Barnet Pall, FNP for planned Left L3-4 Lumbar Transforaminal epidural steroid injection with fluoroscopic guidance.  The patient has failed conservative care including home exercise, medications, time and activity modification.  This injection will be diagnostic and hopefully therapeutic.  Please see requesting physician notes for further details and justification.  ROS Otherwise per HPI.  Assessment & Plan: Visit Diagnoses:    ICD-10-CM   1. Lumbar radiculopathy  M54.16 XR C-ARM NO REPORT    2. Foraminal stenosis of lumbar region  M48.061 XR C-ARM NO REPORT      Plan: No additional findings.   Meds & Orders: No orders of the defined types were placed in this encounter.   Orders Placed This Encounter  Procedures   XR C-ARM NO REPORT    Follow-up: Return if symptoms worsen or fail to improve.   Procedures: Lumbosacral Transforaminal Epidural Steroid Injection - Sub-Pedicular Approach with Fluoroscopic Guidance  Patient: Bruce Weber.      Date of Birth: Mar 22, 1951 MRN: [id number] PCP: Billie Ruddy, MD      Visit Date: 10/13/2021   Universal Protocol:    Date/Time: 10/13/2021  Consent Given By: the patient  Position: PRONE  Additional Comments: Vital signs were monitored before and after the procedure. Patient was prepped and draped in the usual sterile fashion. The correct patient, procedure, and site was verified.   Injection Procedure Details:   Procedure diagnoses: Lumbar radiculopathy [M54.16]    Meds Administered: 80 mg Depo-Medrol  Laterality: Left  Location/Site: L3  Needle:5.0 in., 22 ga.  Short bevel or  Quincke spinal needle  Needle Placement: Transforaminal  Findings:    -Comments: Excellent flow of contrast along the nerve, nerve root and into the epidural space.  Procedure Details: After squaring off the end-plates to get a true AP view, the C-arm was positioned so that an oblique view of the foramen as noted above was visualized. The target area is just inferior to the "nose of the scotty dog" or sub pedicular. The soft tissues overlying this structure were infiltrated with 2-3 ml. of 1% Lidocaine without Epinephrine.  The spinal needle was inserted toward the target using a "trajectory" view along the fluoroscope beam.  Under AP and lateral visualization, the needle was advanced so it did not puncture dura and was located close the 6 O'Clock position of the pedical in AP tracterory. Biplanar projections were used to confirm position. Aspiration was confirmed to be negative for CSF and/or blood. A 1-2 ml. volume of Isovue-250 was injected and flow of contrast was noted at each level. Radiographs were obtained for documentation purposes.   After attaining the desired flow of contrast documented above, a 0.5 to 1.0 ml test dose of 0.25% Marcaine was injected into each respective transforaminal space.  The patient was observed for 90 seconds post injection.  After no sensory deficits were reported, and normal lower extremity motor function was noted,   the above injectate was administered so that equal amounts of the injectate were placed at each foramen (level) into the transforaminal epidural space.   Additional Comments:  The  patient tolerated the procedure well Dressing: 2 x 2 sterile gauze and Band-Aid    Post-procedure details: Patient was observed during the procedure. Post-procedure instructions were reviewed.  Patient left the clinic in stable condition.      Clinical History: CLINICAL DATA:  Low back pain radiating down both legs   EXAM: MRI LUMBAR SPINE WITHOUT  CONTRAST   TECHNIQUE: Multiplanar, multisequence MR imaging of the lumbar spine was performed. No intravenous contrast was administered.   COMPARISON:  No recent imaging, prior is from 2010   FINDINGS: Segmentation:  Standard.   Alignment:  Anteroposterior alignment is maintained.   Vertebrae: Vertebral body heights are preserved. There is moderate marrow edema adjacent to a Schmorl's node at the inferior endplate of L1. Mild opposing endplate marrow edema is also present at L2. No suspicious osseous lesion.   Conus medullaris and cauda equina: Conus extends to the L1 level. Conus and cauda equina appear normal.   Paraspinal and other soft tissues: Unremarkable.   Disc levels:   L1-L2: Disc bulge and mild facet arthropathy with ligamentum flavum infolding. No significant canal or right foraminal stenosis. Minor left foraminal stenosis.   L2-L3: Disc bulge and mild facet arthropathy with ligamentum flavum infolding. No significant canal stenosis. Mild right foraminal stenosis. Minor left foraminal stenosis.   L3-L4: Disc bulge and mild facet arthropathy with ligamentum flavum infolding. No significant canal stenosis. Mild to moderate foraminal stenosis.   L4-L5: Disc bulge and mild facet arthropathy with ligamentum flavum infolding. No significant canal stenosis. Mild foraminal stenosis.   L5-S1: Disc bulge with small central and right subarticular annular fissures. No canal or foraminal stenosis.   IMPRESSION: Multilevel degenerative changes as detailed above. There is no high-grade canal stenosis. Foraminal stenosis is greatest at L3-L4.     Electronically Signed   By: Macy Mis M.D.   On: 04/30/2020 11:49     Objective:  VS:  HT:    WT:   BMI:     BP:(!) 160/78  HR:(!) 58bpm  TEMP: ( )  RESP:  Physical Exam Vitals and nursing note reviewed.  Constitutional:      General: He is not in acute distress.    Appearance: Normal appearance. He is not  ill-appearing.  HENT:     Head: Normocephalic and atraumatic.     Right Ear: External ear normal.     Left Ear: External ear normal.     Nose: No congestion.  Eyes:     Extraocular Movements: Extraocular movements intact.  Cardiovascular:     Rate and Rhythm: Normal rate.     Pulses: Normal pulses.  Pulmonary:     Effort: Pulmonary effort is normal. No respiratory distress.  Abdominal:     General: There is no distension.     Palpations: Abdomen is soft.  Musculoskeletal:        General: No tenderness or signs of injury.     Cervical back: Neck supple.     Right lower leg: No edema.     Left lower leg: No edema.     Comments: Patient has good distal strength without clonus.  Skin:    Findings: No erythema or rash.  Neurological:     General: No focal deficit present.     Mental Status: He is alert and oriented to person, place, and time.     Sensory: No sensory deficit.     Motor: No weakness or abnormal muscle tone.     Coordination: Coordination normal.  Psychiatric:        Mood and Affect: Mood normal.        Behavior: Behavior normal.     Imaging: No results found.

## 2021-11-10 ENCOUNTER — Other Ambulatory Visit: Payer: Self-pay

## 2021-11-10 ENCOUNTER — Ambulatory Visit: Payer: Medicare Other | Admitting: Physical Medicine and Rehabilitation

## 2021-11-10 ENCOUNTER — Encounter: Payer: Self-pay | Admitting: Physical Medicine and Rehabilitation

## 2021-11-10 ENCOUNTER — Ambulatory Visit: Payer: Self-pay

## 2021-11-10 VITALS — BP 157/73 | HR 62

## 2021-11-10 DIAGNOSIS — M542 Cervicalgia: Secondary | ICD-10-CM | POA: Diagnosis not present

## 2021-11-10 DIAGNOSIS — M5416 Radiculopathy, lumbar region: Secondary | ICD-10-CM | POA: Diagnosis not present

## 2021-11-10 DIAGNOSIS — M48061 Spinal stenosis, lumbar region without neurogenic claudication: Secondary | ICD-10-CM

## 2021-11-10 DIAGNOSIS — M7918 Myalgia, other site: Secondary | ICD-10-CM | POA: Diagnosis not present

## 2021-11-10 DIAGNOSIS — M47812 Spondylosis without myelopathy or radiculopathy, cervical region: Secondary | ICD-10-CM | POA: Diagnosis not present

## 2021-11-10 NOTE — Progress Notes (Signed)
Pt state his last inj helped with his back pain. Pt had some sworn for a few days then the inj really started to help.  Pt state neck pain that travels down his left shoulder. Pt state any movement makes the pain worse.  Numeric Pain Rating Scale and Functional Assessment Average Pain 7 Pain Right Now 4 My pain is intermittent, dull, and aching Pain is worse with: sitting, standing, and some activites Pain improves with: medication   In the last MONTH (on 0-10 scale) has pain interfered with the following?  1. General activity like being  able to carry out your everyday physical activities such as walking, climbing stairs, carrying groceries, or moving a chair?  Rating(6)  2. Relation with others like being able to carry out your usual social activities and roles such as  activities at home, at work and in your community. Rating(7)  3. Enjoyment of life such that you have  been bothered by emotional problems such as feeling anxious, depressed or irritable?  Rating(8)

## 2021-11-10 NOTE — Progress Notes (Signed)
Bruce Weber. - 70 y.o. male MRN [id number]  Date of birth: [birth year]  Office Visit Note: Visit Date: 11/10/2021 PCP: Billie Ruddy, MD Referred by: Billie Ruddy, MD  Subjective: Chief Complaint  Patient presents with   Neck - Pain   Left Shoulder - Pain   HPI: Bruce Weber. is a 70 y.o. male who comes in today Patient reports chronic, worsening and severe bilateral neck pain. Patient reports pain has been ongoing for several months. Patient states he did have his chronic neck pain evaluated by Dr. Arther Abbott 30 years ago. He also reports he did have surgical consultation with Dr. Jovita Gamma around this same time who determined he was not a surgical candidate. Patient reports pain is exacerbated by movement and activity. 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 Patient describes pain as a soreness sensation and currently rates as 7 out of 10. Patient reports some relief of pain with home exercise regimen, use of heating pad and medications.   Patient also reports chronic left lower back, hip and flank pain. Patient had left L3 transforaminal epidural steroid injection on 10/13/2021 and reports good and sustained pain relief. Patient states he feels much better, reports he does have intermittent pain but is able to alleviate his pain by taking hot baths with epsom salt. Overall, patient feels he is now functioning better and is able to complete daily tasks without difficulty.   Patient denies focal weakness, numbness and tingling. Patient denies recent trauma or falls.        Review of Systems  Musculoskeletal:  Positive for back pain and neck pain.  Neurological:  Negative for tingling, sensory change, focal weakness and weakness.  All other systems reviewed and are negative. Otherwise per HPI.  Assessment & Plan: Visit Diagnoses:    ICD-10-CM   1. Cervicalgia  M54.2 XR Cervical Spine 2 or 3 views    Ambulatory  referral to Physical Therapy    CANCELED: DG Cervical Spine 2 or 3 views    2. Spondylosis of cervical region without myelopathy or radiculopathy  M47.812     3. Myofascial pain syndrome  M79.18     4. Lumbar radiculopathy  M54.16     5. Foraminal stenosis of lumbar region  M48.061        Plan: Findings:  1. Chronic, worsening and severe bilateral neck pain.  Patient continues to have severe pain despite good conservative therapies such as home exercise regimen, use of heating pad and medications.  Patient's clinical presentation and exam are consistent with myofascial pain.  We did obtain cervical x-Manas images in the office today which exhibits multilevel degenerative changes and loss of disc height noted at C4-C5.  We believe the next step is to place a referral for formal physical therapy with our in-house team.  We feel that patient could benefit significantly from manual treatments and possible dry needling if indicated.  We did schedule a 6-week follow-up with patient today to reevaluate his pain after several sessions of physical therapy.  If patient's pain is alleviated with physical therapy treatments we will continue to monitor and regroup as needed, however if his pain does persist we will consider obtaining formal MRI imaging of his cervical spine.    2. Chronic left lower back, hip and flank pain.  Patient continues to have significant and sustained pain relief from recent left L3 transforaminal epidural steroid injection.  He  is now able to better manage his pain at home and does voice an increase in his functionality. We will continue to monitor patient closely, he is instructed to let us know if his pain increases or changes.  Patient encouraged to remain active and continue home exercise regimen as tolerated. No red flag symptoms noted upon exam today.      Meds & Orders: No orders of the defined types were placed in this encounter.   Orders Placed This Encounter  Procedures    XR Cervical Spine 2 or 3 views   Ambulatory referral to Physical Therapy    Follow-up: Return in about 6 weeks (around 12/22/2021) for re-evaluation after several sessions of PT.   Procedures: No procedures performed      Clinical History: CLINICAL DATA:  Low back pain radiating down both legs   EXAM: MRI LUMBAR SPINE WITHOUT CONTRAST   TECHNIQUE: Multiplanar, multisequence MR imaging of the lumbar spine was performed. No intravenous contrast was administered.   COMPARISON:  No recent imaging, prior is from 2010   FINDINGS: Segmentation:  Standard.   Alignment:  Anteroposterior alignment is maintained.   Vertebrae: Vertebral body heights are preserved. There is moderate marrow edema adjacent to a Schmorl's node at the inferior endplate of L1. Mild opposing endplate marrow edema is also present at L2. No suspicious osseous lesion.   Conus medullaris and cauda equina: Conus extends to the L1 level. Conus and cauda equina appear normal.   Paraspinal and other soft tissues: Unremarkable.   Disc levels:   L1-L2: Disc bulge and mild facet arthropathy with ligamentum flavum infolding. No significant canal or right foraminal stenosis. Minor left foraminal stenosis.   L2-L3: Disc bulge and mild facet arthropathy with ligamentum flavum infolding. No significant canal stenosis. Mild right foraminal stenosis. Minor left foraminal stenosis.   L3-L4: Disc bulge and mild facet arthropathy with ligamentum flavum infolding. No significant canal stenosis. Mild to moderate foraminal stenosis.   L4-L5: Disc bulge and mild facet arthropathy with ligamentum flavum infolding. No significant canal stenosis. Mild foraminal stenosis.   L5-S1: Disc bulge with small central and right subarticular annular fissures. No canal or foraminal stenosis.   IMPRESSION: Multilevel degenerative changes as detailed above. There is no high-grade canal stenosis. Foraminal stenosis is greatest at  L3-L4.     Electronically Signed   By: Macy Mis M.D.   On: 04/30/2020 11:49   He reports that he has never smoked. He has never used smokeless tobacco.  Recent Labs    08/24/21 0945  HGBA1C 5.6    Objective:  VS:  HT:    WT:   BMI:     BP: (!) 157/73  HR:62bpm  TEMP: ( )  RESP:  Physical Exam Vitals and nursing note reviewed.  HENT:     Head: Normocephalic and atraumatic.     Right Ear: External ear normal.     Left Ear: External ear normal.     Nose: Nose normal.     Mouth/Throat:     Mouth: Mucous membranes are moist.  Eyes:     Extraocular Movements: Extraocular movements intact.  Cardiovascular:     Rate and Rhythm: Normal rate.     Pulses: Normal pulses.  Pulmonary:     Effort: Pulmonary effort is normal.  Abdominal:     General: Abdomen is flat. There is no distension.  Musculoskeletal:        General: Tenderness present.     Cervical back: Tenderness present.  Comments: Discomfort noted with flexion, extension and side-to-side rotation. Patient has good strength in the upper extremities including 5 out of 5 strength in wrist extension, long finger flexion and APB.  There is no atrophy of the hands intrinsically.  Sensation intact bilaterally. Negative Hoffman's sign. Negative Spurling's sign.    Pt rises from seated position to standing without difficulty. Good lumbar range of motion. Strong distal strength without clonus, no pain upon palpation of greater trochanters. Sensation intact bilaterally. Walks independently, gait steady.   Skin:    General: Skin is warm and dry.     Capillary Refill: Capillary refill takes less than 2 seconds.  Neurological:     General: No focal deficit present.     Mental Status: He is alert and oriented to person, place, and time.  Psychiatric:        Mood and Affect: Mood normal.    Ortho Exam  Imaging: XR Cervical Spine 2 or 3 views  Result Date: 11/11/2021 Forward flexed cervical spine, decent lordosis, no  listhesis noted. Multi-level mild degenerative changes, loss of disc height noted at C4-C5, mild to moderate spondylosis,no fractures or dislocations.    Past Medical/Family/Surgical/Social History: Medications & Allergies reviewed per EMR, new medications updated. Patient Active Problem List   Diagnosis Date Noted   Essential hypertension 09/07/2020   Diet-controlled diabetes mellitus ([place]) 09/07/2020   CKD (chronic kidney disease) stage 4, GFR 15-29 ml/min (HCC) 09/07/2020   SOB (shortness of breath) on exertion 09/07/2020   Foraminal stenosis of lumbar region 05/05/2020   Colon adenomas    Insomnia 01/11/2016   Internal hemorrhoids with complication 81/12/7508   Obstructive sleep apnea 07/27/2013   Seasonal and perennial allergic rhinitis 07/27/2013   Hx of colonic polyps 11/15/2012   UNSPECIFIED DISEASE OF THE JAWS 03/11/2010   COLONIC POLYPS, ADENOMATOUS 06/30/2009   HYPERLIPIDEMIA 10/13/2008   GLOBUS HYSTERICUS 10/13/2008   GASTROESOPHAGEAL REFLUX DISEASE 10/13/2008   RENAL CALCULUS 10/13/2008   Dysphagia, idiopathic 10/13/2008   ABDOMINAL PAIN 10/13/2008   H N P-LUMBAR 06/23/2008   SPINAL STENOSIS 06/23/2008   Chronic midline low back pain without sciatica 06/23/2008   Past Medical History:  Diagnosis Date   Adenoma 11/2009   1.1 cm from TCS   DM (diabetes mellitus) (HCC)    GERD (gastroesophageal reflux disease)    High cholesterol    HTN (hypertension)    Kidney stones    Obesity (BMI 30-39.9) DEC 2011 223 LBS   Rectal bleeding 2011   secondary to hemorrhoids   Family History  Problem Relation Age of Onset   Lung cancer Father    High blood pressure Father    Colon cancer Neg Hx    Colon polyps Neg Hx    Past Surgical History:  Procedure Laterality Date   CIRCUMCISION     COLONOSCOPY  Dec 2010 [facility]   MOD IH, SIMPLE ADENOMA 1.1 CM   COLONOSCOPY  11/25/2012   SLF: 1. Sessile polyp measuring 62mm in size was found at the hepatic flesure; polypectomy was  performed using snare cautery 2. Moderate diverticulosis was noted in the ascending colon and sigmoid colon. 3. The colon mucosa was otherwise normal 4. Large internal hemorrhoids.    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

## 2021-11-20 ENCOUNTER — Other Ambulatory Visit: Payer: Self-pay | Admitting: Nurse Practitioner

## 2021-11-20 DIAGNOSIS — K219 Gastro-esophageal reflux disease without esophagitis: Secondary | ICD-10-CM

## 2021-12-02 IMAGING — DX DG LUMBAR SPINE COMPLETE 4+V
5 series · 5 of 5 positions shown · non-contrast
Comparison: Lumbar spine radiograph dated 01/27/2019.

CLINICAL DATA: 68-year-old male with back pain.

EXAM:
LUMBAR SPINE - COMPLETE 4+ VIEW

[l-spine ap]
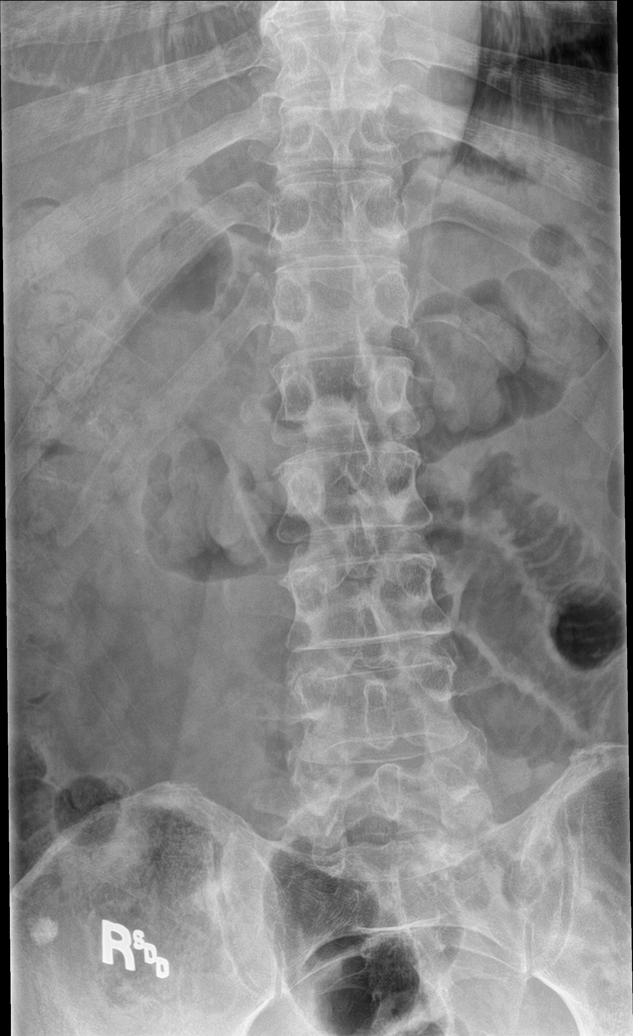

[l-spine obl (1 of 2)]
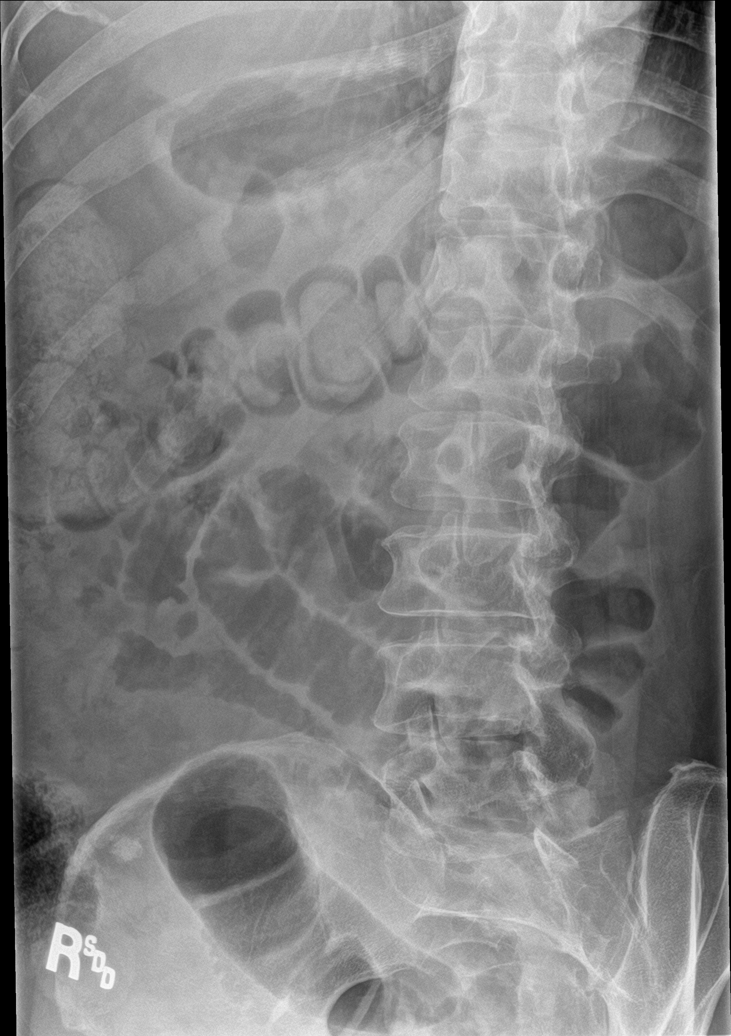

[l-spine lat]
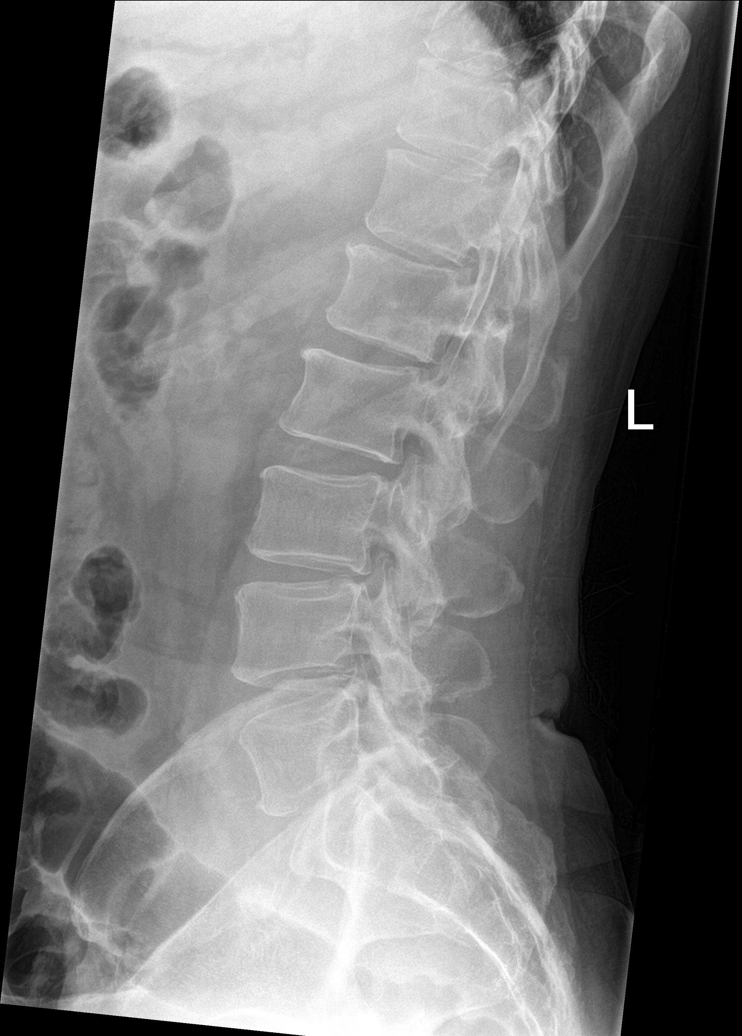

[l-spine spot]
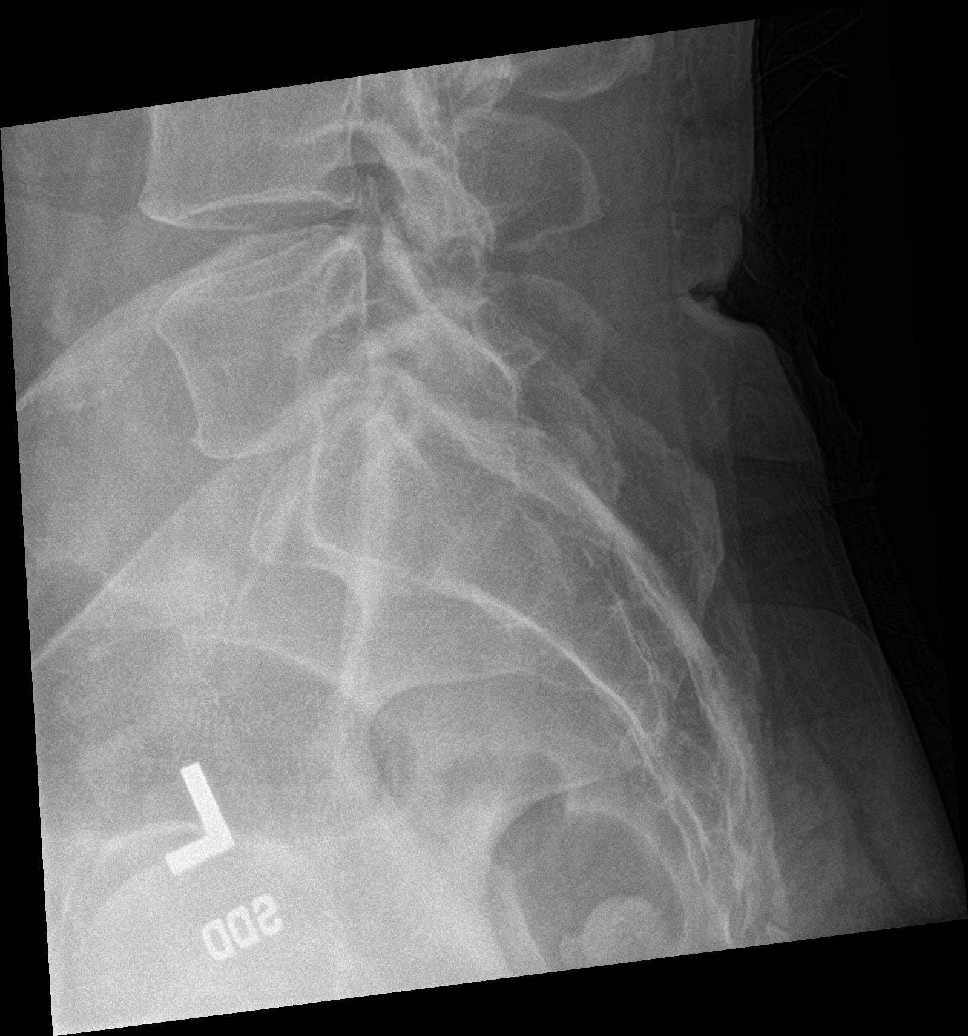

[l-spine obl (2 of 2)]
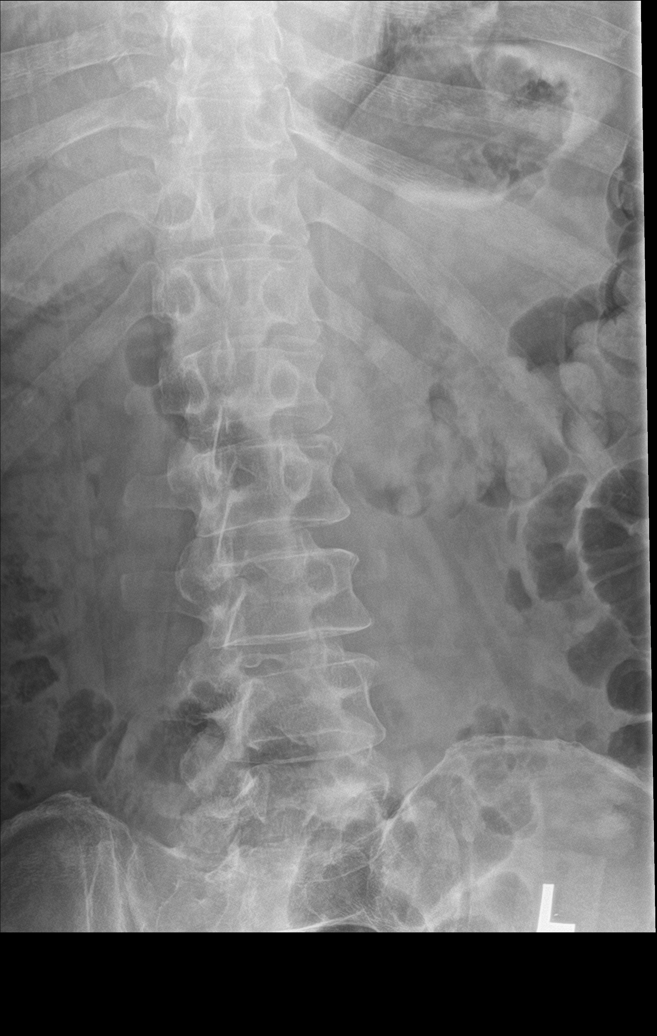

[5 of 5 positions shown; findings below may reference images not displayed]

FINDINGS: Five lumbar type vertebra. There is no acute fracture or subluxation
of the lumbar spine. Mild degenerative changes and bone spurring.
The visualized posterior elements are intact. Mild multilevel disc
space narrowing. A 1 cm sclerotic focus over the right iliac wing,
indeterminate, likely a bone island. The soft tissues are
unremarkable.
IMPRESSION: 1. No acute/traumatic lumbar spine pathology.
2. Mild degenerative changes.

## 2021-12-05 ENCOUNTER — Other Ambulatory Visit: Payer: Self-pay | Admitting: Family Medicine

## 2021-12-12 ENCOUNTER — Other Ambulatory Visit: Payer: Self-pay

## 2021-12-12 ENCOUNTER — Ambulatory Visit (HOSPITAL_COMMUNITY): Payer: Medicare Other | Attending: Physical Medicine and Rehabilitation

## 2021-12-12 DIAGNOSIS — R29898 Other symptoms and signs involving the musculoskeletal system: Secondary | ICD-10-CM | POA: Diagnosis present

## 2021-12-12 DIAGNOSIS — R293 Abnormal posture: Secondary | ICD-10-CM | POA: Diagnosis present

## 2021-12-12 DIAGNOSIS — M542 Cervicalgia: Secondary | ICD-10-CM | POA: Insufficient documentation

## 2021-12-12 DIAGNOSIS — M545 Low back pain, unspecified: Secondary | ICD-10-CM | POA: Insufficient documentation

## 2021-12-12 NOTE — Therapy (Signed)
[facility] [address] [place], [place], [address] Phone: [phone]   Fax:  [phone]  Physical Therapy Evaluation  Patient Details  Name: Bruce Weber. MRN: [id number] Date of Birth: 1951-10-07 Referring Provider (PT): Lorine Bears, NP   Encounter Date: 12/12/2021   PT End of Session - 12/12/21 1554     Visit Number 1    Number of Visits 8    Authorization Type [employer] Medicare    PT Start Time 1600    PT Stop Time 3664    PT Time Calculation (min) 45 min    Activity Tolerance Patient tolerated treatment well    Behavior During Therapy [facility] for tasks assessed/performed             Past Medical History:  Diagnosis Date   Adenoma 11/2009   1.1 cm from TCS   DM (diabetes mellitus) ([place])    GERD (gastroesophageal reflux disease)    High cholesterol    HTN (hypertension)    Kidney stones    Obesity (BMI 30-39.9) DEC 2011 223 LBS   Rectal bleeding 2011   secondary to hemorrhoids    Past Surgical History:  Procedure Laterality Date   CIRCUMCISION     COLONOSCOPY  Dec 2010 [facility]   MOD IH, SIMPLE ADENOMA 1.1 CM   COLONOSCOPY  11/25/2012   SLF: 1. Sessile polyp measuring 73mm in size was found at the hepatic flesure; polypectomy was performed using snare cautery 2. Moderate diverticulosis was noted in the ascending colon and sigmoid colon. 3. The colon mucosa was otherwise normal 4. Large internal hemorrhoids.    COLONOSCOPY N/A 03/01/2017   Procedure: COLONOSCOPY;  Surgeon: Danie Binder, MD;  [facility];  Service: Endoscopy;  Laterality: N/A;  10:30 AM   EYE SURGERY  05/2018   bilateral cataract surgery   HERNIA REPAIR      There were no vitals filed for this visit.    Subjective Assessment - 12/12/21 1559     Subjective Pt has hx of neck pain and has recently been experiencing LUE symptoms the last 2-3 months. Pt notes worse symptoms at night and certain sleeping positions and with his  employment which requires prolonged time at a desk and computer    Currently in Pain? Yes    Pain Score 6    by end of work day   Pain Location Neck    Pain Orientation Left    Pain Descriptors / Indicators Aching;Burning;Sore    Pain Type Chronic pain    Aggravating Factors  desk work                [facility] PT Assessment - 12/12/21 0001       Assessment   Medical Diagnosis Cervicalgia    Referring Provider (PT) Lorine Bears, NP    Hand Dominance Left    Next MD Visit 12/22/21    Prior Therapy not for neck      Prior Function   Level of Independence Independent    Vocation Full time employment    Vocation Requirements desk/computer      Observation/Other Assessments   Focus on Therapeutic Outcomes (FOTO)  not loaded into system      Posture/Postural Control   Posture/Postural Control Postural limitations    Postural Limitations Rounded Shoulders;Forward head   slumped sitting     ROM / Strength   AROM / PROM / Strength AROM  AROM   AROM Assessment Site Cervical    Cervical Flexion WNL    Cervical Extension 10% limited    Cervical - Right Side Bend 10% limited    Cervical - Left Side Bend 25% limited    Cervical - Right Rotation 10% limited    Cervical - Left Rotation 25% limited      Palpation   Spinal mobility cervical hypomobility lacking extension noted via PIVM                        Objective measurements completed on examination: See above findings.       [facility] Adult PT Treatment/Exercise - 12/12/21 0001       Exercises   Exercises Neck      Neck Exercises: Seated   Neck Retraction 10 reps;3 secs    Other Seated Exercise extension SNAG with towel                     PT Education - 12/12/21 1640     Education Details education on benefit/importance of upright posture and use of lumbar roll to assist. Facilities manager) Educated Patient    Methods Explanation    Comprehension Verbalized  understanding              PT Short Term Goals - 12/12/21 1644       PT SHORT TERM GOAL #1   Title Patient will be independent with HEP in order to improve functional outcomes.    Time 2    Period Weeks    Status New    Target Date 12/26/21               PT Long Term Goals - 12/12/21 1645       PT LONG TERM GOAL #1   Title Demonstrate neutral cervical/postural alignment to reduce postural stress/neck pain    Baseline forward head, rounded shoulders    Time 4    Period Weeks    Status New    Target Date 01/09/22      PT LONG TERM GOAL #2   Title Manifest improved symptoms as reported by neck/LUE pain not exceeding 2/10 by end of work day    Baseline 5-6/10 neck pain    Time 4    Period Weeks    Status New    Target Date 01/09/22                    Plan - 12/12/21 1641     Clinical Impression Statement 71 yo gentleman with hx of chronic neck pain with worsening symptoms and experiencing LUE radicular symptoms who demonstrates reduced cervical spine ROM, postural limitations, neck and LUE pain which limits his work activities and causes difficulty when sleeping.  PT services indicated to reduce postural stress and improve postural alignment and provide for workplace ergonomics to reduce symptoms to reduce deficits and activity limitations    Personal Factors and Comorbidities Age;Time since onset of injury/illness/exacerbation    Examination-Activity Limitations Sit;Sleep    Examination-Participation Restrictions Occupation    Stability/Clinical Decision Making Stable/Uncomplicated    Clinical Decision Making Low    Rehab Potential Excellent    PT Frequency 2x / week    PT Duration 4 weeks    PT Treatment/Interventions ADLs/Self Care Home Management;Electrical Stimulation;Traction;Therapeutic activities;Therapeutic exercise;Patient/family education;Neuromuscular re-education;Manual techniques;Passive range of motion;Dry needling;Spinal  Manipulations;Joint Manipulations;Taping    PT Next Visit Plan Cervical  spine, posture, work place ergonomics    PT Home Exercise Plan chin retraction, supine with towel roll at neck, extension SNAG with towel    Consulted and Agree with Plan of Care Patient             Patient will benefit from skilled therapeutic intervention in order to improve the following deficits and impairments:  Decreased mobility, Decreased range of motion, Hypomobility, Postural dysfunction, Pain  Visit Diagnosis: Neck pain  Abnormal posture     Problem List Patient Active Problem List   Diagnosis Date Noted   Essential hypertension 09/07/2020   Diet-controlled diabetes mellitus ([place]) 09/07/2020   CKD (chronic kidney disease) stage 4, GFR 15-29 ml/min (HCC) 09/07/2020   SOB (shortness of breath) on exertion 09/07/2020   Foraminal stenosis of lumbar region 05/05/2020   Colon adenomas    Insomnia 01/11/2016   Internal hemorrhoids with complication 33/82/5053   Obstructive sleep apnea 07/27/2013   Seasonal and perennial allergic rhinitis 07/27/2013   Hx of colonic polyps 11/15/2012   UNSPECIFIED DISEASE OF THE JAWS 03/11/2010   COLONIC POLYPS, ADENOMATOUS 06/30/2009   HYPERLIPIDEMIA 10/13/2008   GLOBUS HYSTERICUS 10/13/2008   GASTROESOPHAGEAL REFLUX DISEASE 10/13/2008   RENAL CALCULUS 10/13/2008   Dysphagia, idiopathic 10/13/2008   ABDOMINAL PAIN 10/13/2008   H N P-LUMBAR 06/23/2008   SPINAL STENOSIS 06/23/2008   Chronic midline low back pain without sciatica 06/23/2008    Toniann Fail, PT 12/12/2021, 4:47 PM  [place], [place], [address] Phone: [phone]   Fax:  [phone]  Name: Wilma Wuthrich. MRN: [id number] Date of Birth: 1951-04-30

## 2021-12-20 ENCOUNTER — Other Ambulatory Visit: Payer: Self-pay

## 2021-12-20 ENCOUNTER — Encounter (HOSPITAL_COMMUNITY): Payer: Self-pay | Admitting: Physical Therapy

## 2021-12-20 ENCOUNTER — Ambulatory Visit (HOSPITAL_COMMUNITY): Payer: Medicare Other | Admitting: Physical Therapy

## 2021-12-20 DIAGNOSIS — M542 Cervicalgia: Secondary | ICD-10-CM

## 2021-12-20 DIAGNOSIS — R293 Abnormal posture: Secondary | ICD-10-CM

## 2021-12-20 NOTE — Therapy (Signed)
[place], [place], [address] Phone: [phone]   Fax:  [phone]  Physical Therapy Treatment  Patient Details  Name: Bruce Weber. MRN: [id number] Date of Birth: [birth year] Referring Provider (PT): Lorine Bears, NP   Encounter Date: 12/20/2021   PT End of Session - 12/20/21 1426     Visit Number 2    Number of Visits 8    Authorization Type UHC Medicare    PT Start Time 2979    PT Stop Time 8921    PT Time Calculation (min) 43 min    Activity Tolerance Patient tolerated treatment well    Behavior During Therapy [facility] for tasks assessed/performed             Past Medical History:  Diagnosis Date   Adenoma 11/2009   1.1 cm from TCS   DM (diabetes mellitus) ([place])    GERD (gastroesophageal reflux disease)    High cholesterol    HTN (hypertension)    Kidney stones    Obesity (BMI 30-39.9) DEC 2011 223 LBS   Rectal bleeding 2011   secondary to hemorrhoids    Past Surgical History:  Procedure Laterality Date   CIRCUMCISION     COLONOSCOPY  Dec 2010 [facility]   MOD IH, SIMPLE ADENOMA 1.1 CM   COLONOSCOPY  11/25/2012   SLF: 1. Sessile polyp measuring 95mm in size was found at the hepatic flesure; polypectomy was performed using snare cautery 2. Moderate diverticulosis was noted in the ascending colon and sigmoid colon. 3. The colon mucosa was otherwise normal 4. Large internal hemorrhoids.    COLONOSCOPY N/A 03/01/2017   Procedure: COLONOSCOPY;  Surgeon: Danie Binder, MD;  [facility];  Service: Endoscopy;  Laterality: N/A;  10:30 AM   EYE SURGERY  05/2018   bilateral cataract surgery   HERNIA REPAIR      There were no vitals filed for this visit.   Subjective Assessment - 12/20/21 1425     Subjective Patient states he is doing well today, though might have "overdone it with exercises". He notes some muscle soreness in RT side of neck, pain level about a 4 today.    Currently  in Pain? Yes    Pain Score 4     Pain Location Neck    Pain Orientation Right    Pain Descriptors / Indicators Aching    Pain Type Chronic pain                               [facility] Adult PT Treatment/Exercise - 12/20/21 0001       Neck Exercises: Seated   Neck Retraction 20 reps;3 secs    Other Seated Exercise extension SNAG with towel x10, scap retraction 10 x 3"      Neck Exercises: Supine   Neck Retraction 10 reps;3 secs    Other Supine Exercise scap retraction 15 x 3", RTB shoulder ER x20 with 3 sec hold, shoulder horiz abduction RTB x20, shoulder flexion stretch with 5 second hold x 15 using rod, cervical rotation AROM 15 x 3" each                       PT Short Term Goals - 12/12/21 1644       PT SHORT TERM GOAL #1   Title Patient will be independent with HEP in order to  improve functional outcomes.    Time 2    Period Weeks    Status New    Target Date 12/26/21               PT Long Term Goals - 12/12/21 1645       PT LONG TERM GOAL #1   Title Demonstrate neutral cervical/postural alignment to reduce postural stress/neck pain    Baseline forward head, rounded shoulders    Time 4    Period Weeks    Status New    Target Date 01/09/22      PT LONG TERM GOAL #2   Title Manifest improved symptoms as reported by neck/LUE pain not exceeding 2/10 by end of work day    Baseline 5-6/10 neck pain    Time 4    Period Weeks    Status New    Target Date 01/09/22                   Plan - 12/20/21 1456     Clinical Impression Statement Reviewed therapy goals and HEP. Initiated ther ex. Progressed scapular strengthening exercises, patient educated on proper form and function of all added activity. Patient cued on proper hand position with band external rotation. Tolerated session well today. Issued updated HEP handout. Patient will continue to benefit from skilled therapy services to reduce deficits and improve functional  ability.    Personal Factors and Comorbidities Age;Time since onset of injury/illness/exacerbation    Examination-Activity Limitations Sit;Sleep    Examination-Participation Restrictions Occupation    Stability/Clinical Decision Making Stable/Uncomplicated    Rehab Potential Excellent    PT Frequency 2x / week    PT Duration 4 weeks    PT Treatment/Interventions ADLs/Self Care Home Management;Electrical Stimulation;Traction;Therapeutic activities;Therapeutic exercise;Patient/family education;Neuromuscular re-education;Manual techniques;Passive range of motion;Dry needling;Spinal Manipulations;Joint Manipulations;Taping    PT Next Visit Plan Cervical spine, posture, work place ergonomics    PT Home Exercise Plan chin retraction, supine with towel roll at neck, extension SNAG with towel 1/17 scap retraction    Consulted and Agree with Plan of Care Patient             Patient will benefit from skilled therapeutic intervention in order to improve the following deficits and impairments:  Decreased mobility, Decreased range of motion, Hypomobility, Postural dysfunction, Pain  Visit Diagnosis: Neck pain  Abnormal posture     Problem List Patient Active Problem List   Diagnosis Date Noted   Essential hypertension 09/07/2020   Diet-controlled diabetes mellitus ([place]) 09/07/2020   CKD (chronic kidney disease) stage 4, GFR 15-29 ml/min (HCC) 09/07/2020   SOB (shortness of breath) on exertion 09/07/2020   Foraminal stenosis of lumbar region 05/05/2020   Colon adenomas    Insomnia 01/11/2016   Internal hemorrhoids with complication 69/62/9528   Obstructive sleep apnea 07/27/2013   Seasonal and perennial allergic rhinitis 07/27/2013   Hx of colonic polyps 11/15/2012   UNSPECIFIED DISEASE OF THE JAWS 03/11/2010   COLONIC POLYPS, ADENOMATOUS 06/30/2009   HYPERLIPIDEMIA 10/13/2008   GLOBUS HYSTERICUS 10/13/2008   GASTROESOPHAGEAL REFLUX DISEASE 10/13/2008   RENAL CALCULUS 10/13/2008    Dysphagia, idiopathic 10/13/2008   ABDOMINAL PAIN 10/13/2008   H N P-LUMBAR 06/23/2008   SPINAL STENOSIS 06/23/2008   Chronic midline low back pain without sciatica 06/23/2008   3:05 PM, 12/20/21 Josue Hector PT DPT  Physical Therapist with [facility]  [phone]  [address] [place], [place], [address] Phone: [phone]   Fax:  [phone]  Name: Bruce Weber. MRN: [id number] Date of Birth: [birth year]

## 2021-12-20 NOTE — Patient Instructions (Signed)
Access Code: [email or web address] URL: [email or web address] Date: 12/20/2021 Prepared by: Josue Hector  Exercises Seated Scapular Retraction - 3 x daily - 7 x weekly - 1 sets - 15 reps - 5 second hold

## 2021-12-22 ENCOUNTER — Encounter: Payer: Self-pay | Admitting: Physical Medicine and Rehabilitation

## 2021-12-22 ENCOUNTER — Ambulatory Visit (HOSPITAL_COMMUNITY): Payer: Medicare Other | Admitting: Physical Therapy

## 2021-12-22 ENCOUNTER — Other Ambulatory Visit: Payer: Self-pay

## 2021-12-22 ENCOUNTER — Ambulatory Visit: Payer: Medicare Other | Admitting: Physical Medicine and Rehabilitation

## 2021-12-22 VITALS — BP 134/82 | HR 78

## 2021-12-22 DIAGNOSIS — M5416 Radiculopathy, lumbar region: Secondary | ICD-10-CM

## 2021-12-22 DIAGNOSIS — M545 Low back pain, unspecified: Secondary | ICD-10-CM

## 2021-12-22 DIAGNOSIS — M542 Cervicalgia: Secondary | ICD-10-CM | POA: Diagnosis not present

## 2021-12-22 DIAGNOSIS — M48061 Spinal stenosis, lumbar region without neurogenic claudication: Secondary | ICD-10-CM | POA: Diagnosis not present

## 2021-12-22 DIAGNOSIS — M7918 Myalgia, other site: Secondary | ICD-10-CM | POA: Diagnosis not present

## 2021-12-22 DIAGNOSIS — R29898 Other symptoms and signs involving the musculoskeletal system: Secondary | ICD-10-CM

## 2021-12-22 DIAGNOSIS — R293 Abnormal posture: Secondary | ICD-10-CM

## 2021-12-22 NOTE — Progress Notes (Signed)
Pt state he has no pain and the inj on 10/13/21 has helped.

## 2021-12-22 NOTE — Progress Notes (Signed)
Bruce Weber. - 71 y.o. male MRN [id number]  Date of birth: 1951/04/08  Office Visit Note: Visit Date: 12/22/2021 PCP: Billie Ruddy, MD Referred by: Billie Ruddy, MD  Subjective: Chief Complaint  Patient presents with   Lower Back - Pain   HPI: Bruce Weber. is a 71 y.o. male who comes in today for evaluation of chronic bilateral neck pain. Patient states pain has been ongoing for several months. Patient states pain is exacerbated by movement and activity, describes as a constant soreness, denies pain at this time. Patient reports some pain relief with home exercise regimen, use of heating pad and medications. Patient is currently attending formal physical therapy at [facility] and reports significant relief of neck pain with these treatments. Patients recent cervical x-[place] images exhibit multilevel degenerative changes and loss of disc height noted at C4-C5. Patient states he is feeling much better and is now able to be more active.   Patient continues to have significant and sustained relief of left lower back pain radiating to hip and flank region from left L3 transforaminal epidural steroid injection on 10/13/2021. Patient reports pain is exacerbated by walking, standing and activity. Patient states some pain relief with home exercise regimen, use of heating pad and medications. Patient also reports he continues to take epsom salt baths to help alleviate pain. Patient states he is being treated by Dr. Bethann Goo at [facility] and currently denies any back issues. Patient denies focal weakness, numbness and tingling. Patient denies recent trauma or falls.   Review of Systems  Musculoskeletal:  Negative for back pain and neck pain.  Neurological:  Negative for tingling, sensory change, focal weakness and weakness.  All other systems reviewed and are negative. Otherwise per HPI.  Assessment & Plan: Visit Diagnoses:    ICD-10-CM   1.  Cervicalgia  M54.2     2. Myofascial pain syndrome  M79.18     3. Lumbar radiculopathy  M54.16     4. Foraminal stenosis of lumbar region  M48.061        Plan: Findings:  1. Chronic bilateral neck pain. Significant relief of bilateral neck pain with formal physical therapy. Patients clinical presentation and exam continue to be consistent with myofascial pain. We feel the next step is to continue with physical therapy treatments, we do feel dry needling could also be beneficial. If patient continues to have good relief of pain with physical therapy we will monitor closely, however if his pain returns, worsens or changes in nature we would consider obtaining cervical MRI imaging. We would also consider performing cervical facet injections/epidural steroid injection as warranted.   2. Chronic left sided lower back pain with radiation to hip and flank area. Significant relief of pain with recent left L3 transforaminal epidural steroid injection. Patient encouraged to continue chiropractic treatments as needed. Patient instructed to let us know if his pain returns, we would consider repeating epidural steroid injection if warranted. No red flag symptoms noted upon exam today.    Meds & Orders: No orders of the defined types were placed in this encounter.  No orders of the defined types were placed in this encounter.   Follow-up: Return if symptoms worsen or fail to improve.   Procedures: No procedures performed      Clinical History: CLINICAL DATA:  Low back pain radiating down both legs   EXAM: MRI LUMBAR SPINE WITHOUT CONTRAST   TECHNIQUE: Multiplanar, multisequence MR imaging  of the lumbar spine was performed. No intravenous contrast was administered.   COMPARISON:  No recent imaging, prior is from 2010   FINDINGS: Segmentation:  Standard.   Alignment:  Anteroposterior alignment is maintained.   Vertebrae: Vertebral body heights are preserved. There is moderate marrow edema  adjacent to a Schmorl's node at the inferior endplate of L1. Mild opposing endplate marrow edema is also present at L2. No suspicious osseous lesion.   Conus medullaris and cauda equina: Conus extends to the L1 level. Conus and cauda equina appear normal.   Paraspinal and other soft tissues: Unremarkable.   Disc levels:   L1-L2: Disc bulge and mild facet arthropathy with ligamentum flavum infolding. No significant canal or right foraminal stenosis. Minor left foraminal stenosis.   L2-L3: Disc bulge and mild facet arthropathy with ligamentum flavum infolding. No significant canal stenosis. Mild right foraminal stenosis. Minor left foraminal stenosis.   L3-L4: Disc bulge and mild facet arthropathy with ligamentum flavum infolding. No significant canal stenosis. Mild to moderate foraminal stenosis.   L4-L5: Disc bulge and mild facet arthropathy with ligamentum flavum infolding. No significant canal stenosis. Mild foraminal stenosis.   L5-S1: Disc bulge with small central and right subarticular annular fissures. No canal or foraminal stenosis.   IMPRESSION: Multilevel degenerative changes as detailed above. There is no high-grade canal stenosis. Foraminal stenosis is greatest at L3-L4.     Electronically Signed   By: Macy Mis M.D.   On: 04/30/2020 11:49   He reports that he has never smoked. He has never used smokeless tobacco.  Recent Labs    08/24/21 0945  HGBA1C 5.6    Objective:  VS:  HT:     WT:    BMI:      BP:134/82   HR:78bpm   TEMP: ( )   RESP:  Physical Exam Vitals and nursing note reviewed.  HENT:     Head: Normocephalic and atraumatic.     Right Ear: External ear normal.     Left Ear: External ear normal.     Nose: Nose normal.     Mouth/Throat:     Mouth: Mucous membranes are moist.  Eyes:     Extraocular Movements: Extraocular movements intact.  Cardiovascular:     Rate and Rhythm: Normal rate.     Pulses: Normal pulses.  Pulmonary:      Effort: Pulmonary effort is normal.  Abdominal:     General: Abdomen is flat. There is no distension.  Musculoskeletal:        General: Normal range of motion.     Cervical back: Normal range of motion.     Comments: No discomfort noted with flexion, extension and side-to-side rotation. Patient has good strength in the upper extremities including 5 out of 5 strength in wrist extension, long finger flexion and APB.  There is no atrophy of the hands intrinsically.  Sensation intact bilaterally. Negative Hoffman's sign. Negative Spurling's sign.    Pt rises from seated position to standing without difficulty. Good lumbar range of motion. Strong distal strength without clonus, no pain upon palpation of greater trochanters. Sensation intact bilaterally. Walks independently, gait steady.   Skin:    General: Skin is warm and dry.     Capillary Refill: Capillary refill takes less than 2 seconds.  Neurological:     General: No focal deficit present.     Mental Status: He is alert and oriented to person, place, and time.  Psychiatric:  Mood and Affect: Mood normal.        Behavior: Behavior normal.    Ortho Exam  Imaging: No results found.  Past Medical/Family/Surgical/Social History: Medications & Allergies reviewed per EMR, new medications updated. Patient Active Problem List   Diagnosis Date Noted   Essential hypertension 09/07/2020   Diet-controlled diabetes mellitus ([place]) 09/07/2020   CKD (chronic kidney disease) stage 4, GFR 15-29 ml/min (HCC) 09/07/2020   SOB (shortness of breath) on exertion 09/07/2020   Foraminal stenosis of lumbar region 05/05/2020   Colon adenomas    Insomnia 01/11/2016   Internal hemorrhoids with complication 23/34/3568   Obstructive sleep apnea 07/27/2013   Seasonal and perennial allergic rhinitis 07/27/2013   Hx of colonic polyps 11/15/2012   UNSPECIFIED DISEASE OF THE JAWS 03/11/2010   COLONIC POLYPS, ADENOMATOUS 06/30/2009   HYPERLIPIDEMIA  10/13/2008   GLOBUS HYSTERICUS 10/13/2008   GASTROESOPHAGEAL REFLUX DISEASE 10/13/2008   RENAL CALCULUS 10/13/2008   Dysphagia, idiopathic 10/13/2008   ABDOMINAL PAIN 10/13/2008   H N P-LUMBAR 06/23/2008   SPINAL STENOSIS 06/23/2008   Chronic midline low back pain without sciatica 06/23/2008   Past Medical History:  Diagnosis Date   Adenoma 11/2009   1.1 cm from TCS   DM (diabetes mellitus) (HCC)    GERD (gastroesophageal reflux disease)    High cholesterol    HTN (hypertension)    Kidney stones    Obesity (BMI 30-39.9) DEC 2011 223 LBS   Rectal bleeding 2011   secondary to hemorrhoids   Family History  Problem Relation Age of Onset   Lung cancer Father    High blood pressure Father    Colon cancer Neg Hx    Colon polyps Neg Hx    Past Surgical History:  Procedure Laterality Date   CIRCUMCISION     COLONOSCOPY  Dec 2010 [facility]   MOD IH, SIMPLE ADENOMA 1.1 CM   COLONOSCOPY  11/25/2012   SLF: 1. Sessile polyp measuring 12mm in size was found at the hepatic flesure; polypectomy was performed using snare cautery 2. Moderate diverticulosis was noted in the ascending colon and sigmoid colon. 3. The colon mucosa was otherwise normal 4. Large internal hemorrhoids.    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

## 2021-12-22 NOTE — Therapy (Signed)
[place], [place], [address] Phone: [phone]   Fax:  [phone]  Physical Therapy Treatment  Patient Details  Name: Bruce Weber. MRN: [id number] Date of Birth: August 16, 1951 Referring Provider (PT): Lorine Bears, NP   Encounter Date: 12/22/2021   PT End of Session - 12/22/21 1209     Visit Number 3    Number of Visits 8    Authorization Type UHC Medicare    PT Start Time 1050    PT Stop Time 1130    PT Time Calculation (min) 40 min    Activity Tolerance Patient tolerated treatment well    Behavior During Therapy [facility] for tasks assessed/performed             Past Medical History:  Diagnosis Date   Adenoma 11/2009   1.1 cm from TCS   DM (diabetes mellitus) ([place])    GERD (gastroesophageal reflux disease)    High cholesterol    HTN (hypertension)    Kidney stones    Obesity (BMI 30-39.9) DEC 2011 223 LBS   Rectal bleeding 2011   secondary to hemorrhoids    Past Surgical History:  Procedure Laterality Date   CIRCUMCISION     COLONOSCOPY  Dec 2010 [facility]   MOD IH, SIMPLE ADENOMA 1.1 CM   COLONOSCOPY  11/25/2012   SLF: 1. Sessile polyp measuring 24mm in size was found at the hepatic flesure; polypectomy was performed using snare cautery 2. Moderate diverticulosis was noted in the ascending colon and sigmoid colon. 3. The colon mucosa was otherwise normal 4. Large internal hemorrhoids.    COLONOSCOPY N/A 03/01/2017   Procedure: COLONOSCOPY;  Surgeon: Danie Binder, MD;  [facility];  Service: Endoscopy;  Laterality: N/A;  10:30 AM   EYE SURGERY  05/2018   bilateral cataract surgery   HERNIA REPAIR      There were no vitals filed for this visit.   Subjective Assessment - 12/22/21 1053     Subjective Pt states he has radiating pain down intio his Lt shoulder that has not changed much.  States mostly has pain when moves his head into Lt lateral flexion and increases to 4/10.  No  pain when at rest usually.  Reports compliance with HEP.    Pain Score 0-No pain                               [facility] Adult PT Treatment/Exercise - 12/22/21 0001       Neck Exercises: Seated   Neck Retraction Limitations 10X5"    W Back 10 reps    Shoulder Shrugs 10 reps    Shoulder Shrugs Limitations up back and down    Other Seated Exercise cervical excursions 5X each motion, thoracic excursions 5X with UE movements each      Manual Therapy   Manual Therapy Soft tissue mobilization    Manual therapy comments completed seperately from all other skilled interventions at end of session    Soft tissue mobilization to cervical, Lt> in seated position                       PT Short Term Goals - 12/12/21 1644       PT SHORT TERM GOAL #1   Title Patient will be independent with HEP in order to improve functional outcomes.    Time 2  Period Weeks    Status New    Target Date 12/26/21               PT Long Term Goals - 12/12/21 1645       PT LONG TERM GOAL #1   Title Demonstrate neutral cervical/postural alignment to reduce postural stress/neck pain    Baseline forward head, rounded shoulders    Time 4    Period Weeks    Status New    Target Date 01/09/22      PT LONG TERM GOAL #2   Title Manifest improved symptoms as reported by neck/LUE pain not exceeding 2/10 by end of work day    Baseline 5-6/10 neck pain    Time 4    Period Weeks    Status New    Target Date 01/09/22                   Plan - 12/22/21 1207     Clinical Impression Statement completed exercises in seated position. Educated on general posture as noted sitting in waiting room with head slumped down.  Began cervical and thoracic excursions for general mobilitiy as well as scapular and cervical strengthening exericses.  Manual completed at end of session with concentration on Lt cervical and scapular region as this is where his symptoms persist.   Tightness and one area of spasm in Lt upper trap reduced but not fully resolved with manual.    Personal Factors and Comorbidities Age;Time since onset of injury/illness/exacerbation    Examination-Activity Limitations Sit;Sleep    Examination-Participation Restrictions Occupation    Stability/Clinical Decision Making Stable/Uncomplicated    Rehab Potential Excellent    PT Frequency 2x / week    PT Duration 4 weeks    PT Treatment/Interventions ADLs/Self Care Home Management;Electrical Stimulation;Traction;Therapeutic activities;Therapeutic exercise;Patient/family education;Neuromuscular re-education;Manual techniques;Passive range of motion;Dry needling;Spinal Manipulations;Joint Manipulations;Taping    PT Next Visit Plan Cervical spine, posture, work [employer] officer, [employer].  complete manual if helpful.    PT Home Exercise Plan chin retraction, supine with towel roll at neck, extension SNAG with towel 1/17 scap retraction    Consulted and Agree with Plan of Care Patient             Patient will benefit from skilled therapeutic intervention in order to improve the following deficits and impairments:  Decreased mobility, Decreased range of motion, Hypomobility, Postural dysfunction, Pain  Visit Diagnosis: Neck pain  Abnormal posture  Other symptoms and signs involving the musculoskeletal system  Bilateral low back pain, unspecified chronicity, unspecified whether sciatica present     Problem List Patient Active Problem List   Diagnosis Date Noted   Essential hypertension 09/07/2020   Diet-controlled diabetes mellitus ([place]) 09/07/2020   CKD (chronic kidney disease) stage 4, GFR 15-29 ml/min (HCC) 09/07/2020   SOB (shortness of breath) on exertion 09/07/2020   Foraminal stenosis of lumbar region 05/05/2020   Colon adenomas    Insomnia 01/11/2016   Internal hemorrhoids with complication 22/01/5426   Obstructive sleep apnea 07/27/2013   Seasonal and perennial allergic rhinitis  07/27/2013   Hx of colonic polyps 11/15/2012   UNSPECIFIED DISEASE OF THE JAWS 03/11/2010   COLONIC POLYPS, ADENOMATOUS 06/30/2009   HYPERLIPIDEMIA 10/13/2008   GLOBUS HYSTERICUS 10/13/2008   GASTROESOPHAGEAL REFLUX DISEASE 10/13/2008   RENAL CALCULUS 10/13/2008   Dysphagia, idiopathic 10/13/2008   ABDOMINAL PAIN 10/13/2008   H N P-LUMBAR 06/23/2008   SPINAL STENOSIS 06/23/2008   Chronic midline low back pain without sciatica 06/23/2008  Teena Irani, PTA/CLT, WTA [phone]  Teena Irani, PTA 12/22/2021, 12:10 PM  [place] [address] [place], [place], [address] Phone: [phone]   Fax:  [phone]  Name: Bruce Weber. MRN: [id number] Date of Birth: 02-06-1951

## 2021-12-26 ENCOUNTER — Ambulatory Visit (INDEPENDENT_AMBULATORY_CARE_PROVIDER_SITE_OTHER): Payer: Medicare Other | Admitting: Family Medicine

## 2021-12-26 ENCOUNTER — Encounter: Payer: Self-pay | Admitting: Family Medicine

## 2021-12-26 VITALS — BP 141/79 | HR 58 | Temp 98.2°F | Resp 18 | Wt 211.0 lb

## 2021-12-26 DIAGNOSIS — I10 Essential (primary) hypertension: Secondary | ICD-10-CM | POA: Diagnosis not present

## 2021-12-26 DIAGNOSIS — G4733 Obstructive sleep apnea (adult) (pediatric): Secondary | ICD-10-CM

## 2021-12-26 DIAGNOSIS — K219 Gastro-esophageal reflux disease without esophagitis: Secondary | ICD-10-CM

## 2021-12-26 DIAGNOSIS — N184 Chronic kidney disease, stage 4 (severe): Secondary | ICD-10-CM | POA: Diagnosis not present

## 2021-12-26 DIAGNOSIS — E1122 Type 2 diabetes mellitus with diabetic chronic kidney disease: Secondary | ICD-10-CM

## 2021-12-26 NOTE — Patient Instructions (Addendum)
You can try taking your second set of pills around 12 noon or 1 PM to help space your medications out a little bit better.  You can still take the first set of pills around 7 or 8 AM and your evening pills around 9 or 10 PM.  Consider asking friends to get tested to see if they are possibly match to be a kidney donor.  I also included some information about hepatitis C for you to look over.

## 2021-12-26 NOTE — Progress Notes (Signed)
Subjective:    Patient ID: Bruce Gerold., male    DOB: [birth year], 71 y.o.   MRN: [id number]  Chief Complaint  Patient presents with   Follow-up    Kidney disease pt stated he has been offered a kidney but needs clarity on Hep B and Hep C because they both had them    HPI Patient was seen today for follow-up.  Patient states he has been doing well overall.  Patient states he received 2 phone calls regarding possible kidney transplants this month.  Patient declined both kidneys as they had hepatitis.  Patient notes BP and creatinine increasing slightly.  Followed by nephrology.  If needed will be able to start PD.  Patient inquires about timing of medications.  Currently taking a group of meds at 7 or 8 AM, another group around 9 or 10 am and then his nighttime meds around 9 or 10 PM.  Pt's mother recently turned 55 and he had to take keys away from her.  Patient's mother-in-law has been dealing with some health issues.  Pt is still working and doing things around the house.  Pt got a new CPAP which has been great.  Blood sugar has been stable.  Typically 120s or less.  Pt physical therapy for chronic neck and low back pain.  Sits at desk all day for work.  Was walking on the treadmill, plans to restart.  Past Medical History:  Diagnosis Date   Adenoma 11/2009   1.1 cm from TCS   DM (diabetes mellitus) (HCC)    GERD (gastroesophageal reflux disease)    High cholesterol    HTN (hypertension)    Kidney stones    Obesity (BMI 30-39.9) DEC 2011 223 LBS   Rectal bleeding 2011   secondary to hemorrhoids    Allergies  Allergen Reactions   Dye Fdc Blue [Brilliant Blue Fcf (Fd&C Blue #1)]     PT UNSURE WHICH DYE   Iodinated Contrast Media Hives    30 YEARS AGO, recent 2021 epidural with contrast ok    ROS General: Denies fever, chills, night sweats, changes in weight, changes in appetite HEENT: Denies headaches, ear pain, changes in vision, rhinorrhea, sore throat CV: Denies CP,  palpitations, SOB, orthopnea Pulm: Denies SOB, cough, wheezing GI: Denies abdominal pain, nausea, vomiting, diarrhea, constipation GU: Denies dysuria, hematuria, frequency Msk: Denies muscle cramps, joint pains +neck and back pain Neuro: Denies weakness, numbness, tingling Skin: Denies rashes, bruising Psych: Denies depression, anxiety, hallucinations     Objective:    Blood pressure (!) 141/79, pulse (!) 58, temperature 98.2 F (36.8 C), temperature source Oral, resp. rate 18, weight 211 lb (95.7 kg), SpO2 99 %.  Gen. Pleasant, well-nourished, in no distress, normal affect  HEENT: [place]/AT, face symmetric, conjunctiva clear, no scleral icterus, PERRLA, EOMI, nares patent without drainage Lungs: no accessory muscle use, CTAB, no wheezes or rales Cardiovascular: RRR, no m/r/g, no peripheral edema Musculoskeletal: No deformities, no cyanosis or clubbing, normal tone Neuro:  A&Ox3, CN II-XII intact, normal gait Skin:  Warm, no lesions/ rash   Wt Readings from Last 3 Encounters:  12/26/21 211 lb (95.7 kg)  08/24/21 208 lb 3.2 oz (94.4 kg)  07/29/21 190 lb (86.2 kg)    Lab Results  Component Value Date   WBC 8.7 10/05/2021   HGB 12.3 (A) 10/05/2021   HCT 35 (A) 10/05/2021   PLT 196 10/05/2021   GLUCOSE 103 (H) 08/24/2021   CHOL 166 08/24/2021   TRIG  65.0 08/24/2021   HDL 56.20 08/24/2021   LDLCALC 97 08/24/2021   ALT 12 10/05/2021   AST 13 (A) 10/05/2021   NA 139 10/05/2021   K 4.4 10/05/2021   CL 105 10/05/2021   CREATININE 4.3 (A) 10/05/2021   BUN 39 (A) 10/05/2021   CO2 18 10/05/2021   TSH 2.03 08/24/2021   PSA 1.10 04/15/2021   HGBA1C 5.6 08/24/2021    Assessment/Plan:  CKD (chronic kidney disease) stage 4, GFR 15-29 ml/min (HCC) -Creatinine mildly increased per pt on recent labs with nephrology -On renal transplant list.  Consider asking friends and family to be tested as a possible donor -For continued elevation obtain access to start PD -Continue follow-up  with nephrology and renal transplant surgeon at [facility] hypertension -Elevated -Likely 2/2 CKD 4 -Patient encouraged to check BP at home -Lifestyle modifications -Continue current medications including propranolol 80 mg twice daily, hydralazine 25 mg 3 times daily, Lasix, Norvasc 10 mg daily, felodipine 10 mg -Continue statin  Type 2 diabetes mellitus with stage 4 chronic kidney disease, without long-term current use of insulin (HCC) -Controlled  -Hemoglobin A1c 5.6% on 08/24/2021 -Continue lifestyle modifications as diet-controlled -Continue statin  Obstructive sleep apnea -Continue using CPAP nightly  Gastroesophageal reflux disease, unspecified whether esophagitis present -Continue famotidine 40 mg daily -Lifestyle modifications  F/u in 3-4 months, sooner if needed  Grier Mitts, MD

## 2021-12-27 ENCOUNTER — Ambulatory Visit (HOSPITAL_COMMUNITY): Payer: Medicare Other

## 2021-12-27 ENCOUNTER — Other Ambulatory Visit: Payer: Self-pay

## 2021-12-27 ENCOUNTER — Encounter (HOSPITAL_COMMUNITY): Payer: Self-pay

## 2021-12-27 DIAGNOSIS — M542 Cervicalgia: Secondary | ICD-10-CM

## 2021-12-27 DIAGNOSIS — R293 Abnormal posture: Secondary | ICD-10-CM

## 2021-12-27 NOTE — Therapy (Signed)
[name], [place], [address] Phone: [phone]   Fax:  [phone]  Physical Therapy Treatment  Patient Details  Name: Bruce Weber. MRN: [id number] Date of Birth: 1951-08-19 Referring Provider (PT): Lorine Bears, NP   Encounter Date: 12/27/2021   PT End of Session - 12/27/21 1138     Visit Number 4    Number of Visits 8    Authorization Type UHC Medicare    PT Start Time 1740    PT Stop Time 1208    PT Time Calculation (min) 38 min    Activity Tolerance Patient tolerated treatment well    Behavior During Therapy [facility] for tasks assessed/performed             Past Medical History:  Diagnosis Date   Adenoma 11/2009   1.1 cm from TCS   DM (diabetes mellitus) ([place])    GERD (gastroesophageal reflux disease)    High cholesterol    HTN (hypertension)    Kidney stones    Obesity (BMI 30-39.9) DEC 2011 223 LBS   Rectal bleeding 2011   secondary to hemorrhoids    Past Surgical History:  Procedure Laterality Date   CIRCUMCISION     COLONOSCOPY  Dec 2010 [facility]   MOD IH, SIMPLE ADENOMA 1.1 CM   COLONOSCOPY  11/25/2012   SLF: 1. Sessile polyp measuring 94mm in size was found at the hepatic flesure; polypectomy was performed using snare cautery 2. Moderate diverticulosis was noted in the ascending colon and sigmoid colon. 3. The colon mucosa was otherwise normal 4. Large internal hemorrhoids.    COLONOSCOPY N/A 03/01/2017   Procedure: COLONOSCOPY;  Surgeon: Danie Binder, MD;  [facility];  Service: Endoscopy;  Laterality: N/A;  10:30 AM   EYE SURGERY  05/2018   bilateral cataract surgery   HERNIA REPAIR      There were no vitals filed for this visit.   Subjective Assessment - 12/27/21 1137     Subjective Pt stated he is feeling good, no pain just a little stiffness.  Reports massage helped last session.    Currently in Pain? No/denies                                [facility] Adult PT Treatment/Exercise - 12/27/21 0001       Posture/Postural Control   Posture/Postural Control Postural limitations    Postural Limitations Forward head;Rounded Shoulders      Neck Exercises: Standing   Other Standing Exercises wall arch      Neck Exercises: Seated   Neck Retraction Limitations 10X5"   2 sets   X to V 10 reps    W Back 10 reps    Postural Training seated posture wiht lumbar roll    Other Seated Exercise thoracic excursion 5x      Manual Therapy   Manual Therapy Soft tissue mobilization    Manual therapy comments completed seperately from all other skilled interventions at end of session    Soft tissue mobilization to cervical, Lt> Rt, supine position                       PT Short Term Goals - 12/12/21 1644       PT SHORT TERM GOAL #1   Title Patient will be independent with HEP in order to improve functional outcomes.  Time 2    Period Weeks    Status New    Target Date 12/26/21               PT Long Term Goals - 12/12/21 1645       PT LONG TERM GOAL #1   Title Demonstrate neutral cervical/postural alignment to reduce postural stress/neck pain    Baseline forward head, rounded shoulders    Time 4    Period Weeks    Status New    Target Date 01/09/22      PT LONG TERM GOAL #2   Title Manifest improved symptoms as reported by neck/LUE pain not exceeding 2/10 by end of work day    Baseline 5-6/10 neck pain    Time 4    Period Weeks    Status New    Target Date 01/09/22                   Plan - 12/27/21 1150     Clinical Impression Statement Session focus with postural strengthening.  Continued education with posture and importance of proper posutre for pain control.  Multimodal cueing required for proper mechanics iwht new seated exercises to improve cervical retraction.  EOS with manual to address spasms Lt upper trap.  No reoprts of pain through session.     Personal Factors and Comorbidities Age;Time since onset of injury/illness/exacerbation    Examination-Activity Limitations Sit;Sleep    Examination-Participation Restrictions Occupation    Stability/Clinical Decision Making Stable/Uncomplicated    Clinical Decision Making Low    Rehab Potential Excellent    PT Frequency 2x / week    PT Duration 4 weeks    PT Treatment/Interventions ADLs/Self Care Home Management;Electrical Stimulation;Traction;Therapeutic activities;Therapeutic exercise;Patient/family education;Neuromuscular re-education;Manual techniques;Passive range of motion;Dry needling;Spinal Manipulations;Joint Manipulations;Taping    PT Next Visit Plan Cervical spine, posture, work Research officer, [employer].  complete manual if helpful.  Trial prone exercises next session.    PT Home Exercise Plan chin retraction, supine with towel roll at neck, extension SNAG with towel 1/17 scap retraction    Consulted and Agree with Plan of Care Patient             Patient will benefit from skilled therapeutic intervention in order to improve the following deficits and impairments:  Decreased mobility, Decreased range of motion, Hypomobility, Postural dysfunction, Pain  Visit Diagnosis: Neck pain  Abnormal posture     Problem List Patient Active Problem List   Diagnosis Date Noted   Essential hypertension 09/07/2020   Diet-controlled diabetes mellitus ([place]) 09/07/2020   CKD (chronic kidney disease) stage 4, GFR 15-29 ml/min (HCC) 09/07/2020   SOB (shortness of breath) on exertion 09/07/2020   Foraminal stenosis of lumbar region 05/05/2020   Colon adenomas    Insomnia 01/11/2016   Internal hemorrhoids with complication 51/88/4166   Obstructive sleep apnea 07/27/2013   Seasonal and perennial allergic rhinitis 07/27/2013   Hx of colonic polyps 11/15/2012   UNSPECIFIED DISEASE OF THE JAWS 03/11/2010   COLONIC POLYPS, ADENOMATOUS 06/30/2009   HYPERLIPIDEMIA 10/13/2008   GLOBUS HYSTERICUS  10/13/2008   GASTROESOPHAGEAL REFLUX DISEASE 10/13/2008   RENAL CALCULUS 10/13/2008   Dysphagia, idiopathic 10/13/2008   ABDOMINAL PAIN 10/13/2008   H N P-LUMBAR 06/23/2008   SPINAL STENOSIS 06/23/2008   Chronic midline low back pain without sciatica 06/23/2008   Ihor Austin, LPTA/CLT; CBIS [phone]  Aldona Lento, PTA 12/27/2021, 1:15 PM  [place],  [place], [address] Phone: [phone]   Fax:  [phone]  Name: Bruce Weber. MRN: [id number] Date of Birth: August 14, 1951

## 2021-12-29 ENCOUNTER — Other Ambulatory Visit: Payer: Self-pay

## 2021-12-29 ENCOUNTER — Ambulatory Visit (HOSPITAL_COMMUNITY): Payer: Medicare Other | Admitting: Physical Therapy

## 2021-12-29 ENCOUNTER — Encounter (HOSPITAL_COMMUNITY): Payer: Self-pay | Admitting: Physical Therapy

## 2021-12-29 DIAGNOSIS — R293 Abnormal posture: Secondary | ICD-10-CM

## 2021-12-29 DIAGNOSIS — M542 Cervicalgia: Secondary | ICD-10-CM

## 2021-12-29 NOTE — Therapy (Signed)
[place], [place], [address] Phone: [phone]   Fax:  [phone]  Physical Therapy Treatment  Patient Details  Name: Bruce Weber. MRN: [id number] Date of Birth: 1951/09/17 Referring Provider (PT): Lorine Bears, NP   Encounter Date: 12/29/2021   PT End of Session - 12/29/21 1329     Visit Number 5    Number of Visits 8    Authorization Type UHC Medicare    PT Start Time 5638    PT Stop Time 1423    PT Time Calculation (min) 43 min    Activity Tolerance Patient tolerated treatment well    Behavior During Therapy [facility] for tasks assessed/performed             Past Medical History:  Diagnosis Date   Adenoma 11/2009   1.1 cm from TCS   DM (diabetes mellitus) ([place])    GERD (gastroesophageal reflux disease)    High cholesterol    HTN (hypertension)    Kidney stones    Obesity (BMI 30-39.9) DEC 2011 223 LBS   Rectal bleeding 2011   secondary to hemorrhoids    Past Surgical History:  Procedure Laterality Date   CIRCUMCISION     COLONOSCOPY  Dec 2010 [facility]   MOD IH, SIMPLE ADENOMA 1.1 CM   COLONOSCOPY  11/25/2012   SLF: 1. Sessile polyp measuring 37mm in size was found at the hepatic flesure; polypectomy was performed using snare cautery 2. Moderate diverticulosis was noted in the ascending colon and sigmoid colon. 3. The colon mucosa was otherwise normal 4. Large internal hemorrhoids.    COLONOSCOPY N/A 03/01/2017   Procedure: COLONOSCOPY;  Surgeon: Danie Binder, MD;  [facility];  Service: Endoscopy;  Laterality: N/A;  10:30 AM   EYE SURGERY  05/2018   bilateral cataract surgery   HERNIA REPAIR      There were no vitals filed for this visit.   Subjective Assessment - 12/29/21 1344     Subjective Patient says he is doing well today, no pain. Doing fine with HEP.    Currently in Pain? No/denies                               [facility] Adult PT  Treatment/Exercise - 12/29/21 0001       Neck Exercises: Machines for Strengthening   UBE (Upper Arm Bike) 3/3 FWD/ back      Neck Exercises: Standing   Other Standing Exercises RTB 2 x 10 rows, extension 2 x 10      Neck Exercises: Seated   Neck Retraction 15 reps    Other Seated Exercise scap retraction 15 x 3"    Other Seated Exercise thoracic excursion x10      Neck Exercises: Stretches   Upper Trapezius Stretch 3 reps;30 seconds      Manual Therapy   Manual Therapy Soft tissue mobilization    Manual therapy comments completed seperately from all other skilled interventions at end of session    Soft tissue mobilization IASTM to LT upper trap and levator, patient seated                       PT Short Term Goals - 12/12/21 1644       PT SHORT TERM GOAL #1   Title Patient will be independent with HEP in order to improve functional  outcomes.    Time 2    Period Weeks    Status New    Target Date 12/26/21               PT Long Term Goals - 12/12/21 1645       PT LONG TERM GOAL #1   Title Demonstrate neutral cervical/postural alignment to reduce postural stress/neck pain    Baseline forward head, rounded shoulders    Time 4    Period Weeks    Status New    Target Date 01/09/22      PT LONG TERM GOAL #2   Title Manifest improved symptoms as reported by neck/LUE pain not exceeding 2/10 by end of work day    Baseline 5-6/10 neck pain    Time 4    Period Weeks    Status New    Target Date 01/09/22                   Plan - 12/29/21 1422     Clinical Impression Statement Continued with postural strength progressions. Introduced band rows and extension. Patient showing improved return with chin tucks without increased complaint of pain. Patient does require ongoing verbal cueing for improved posturing and avoiding forward head position during session. Issued updated HEP handout. Patient will continue to benefit from skilled therapy services  to reduce deficits and improve functional level.    Personal Factors and Comorbidities Age;Time since onset of injury/illness/exacerbation    Examination-Activity Limitations Sit;Sleep    Examination-Participation Restrictions Occupation    Stability/Clinical Decision Making Stable/Uncomplicated    Rehab Potential Excellent    PT Frequency 2x / week    PT Duration 4 weeks    PT Treatment/Interventions ADLs/Self Care Home Management;Electrical Stimulation;Traction;Therapeutic activities;Therapeutic exercise;Patient/family education;Neuromuscular re-education;Manual techniques;Passive range of motion;Dry needling;Spinal Manipulations;Joint Manipulations;Taping    PT Next Visit Plan Cervical spine, posture, work Research officer, trade [employer].  complete manual if helpful.  Trial prone exercises next session.    PT Home Exercise Plan chin retraction, supine with towel roll at neck, extension SNAG with towel 1/17 scap retraction 1/26 band rows, extension, UT stretch    Consulted and Agree with Plan of Care Patient             Patient will benefit from skilled therapeutic intervention in order to improve the following deficits and impairments:  Decreased mobility, Decreased range of motion, Hypomobility, Postural dysfunction, Pain  Visit Diagnosis: Neck pain  Abnormal posture     Problem List Patient Active Problem List   Diagnosis Date Noted   Essential hypertension 09/07/2020   Diet-controlled diabetes mellitus ([place]) 09/07/2020   CKD (chronic kidney disease) stage 4, GFR 15-29 ml/min (HCC) 09/07/2020   SOB (shortness of breath) on exertion 09/07/2020   Foraminal stenosis of lumbar region 05/05/2020   Colon adenomas    Insomnia 01/11/2016   Internal hemorrhoids with complication 80/99/8338   Obstructive sleep apnea 07/27/2013   Seasonal and perennial allergic rhinitis 07/27/2013   Hx of colonic polyps 11/15/2012   UNSPECIFIED DISEASE OF THE JAWS 03/11/2010   COLONIC POLYPS, ADENOMATOUS  06/30/2009   HYPERLIPIDEMIA 10/13/2008   GLOBUS HYSTERICUS 10/13/2008   GASTROESOPHAGEAL REFLUX DISEASE 10/13/2008   RENAL CALCULUS 10/13/2008   Dysphagia, idiopathic 10/13/2008   ABDOMINAL PAIN 10/13/2008   H N P-LUMBAR 06/23/2008   SPINAL STENOSIS 06/23/2008   Chronic midline low back pain without sciatica 06/23/2008   2:26 PM, 12/29/21 Josue Hector PT DPT  Physical Therapist with [facility]  [facility]  [phone]  [facility] [place], [place], [address] Phone: [phone]   Fax:  [phone]  Name: Bruce Weber. MRN: [id number] Date of Birth: [birth year]

## 2021-12-29 NOTE — Patient Instructions (Signed)
Access Code: [email or web address] URL: [email or web address] Date: 12/29/2021 Prepared by: Josue Hector  Exercises Standing Shoulder Row with Anchored Resistance - 2-3 x daily - 7 x weekly - 2 sets - 10 reps Shoulder extension with resistance - Neutral - 2-3 x daily - 7 x weekly - 2 sets - 10 reps Seated Upper Trapezius Stretch - 2-3 x daily - 7 x weekly - 1 sets - 3 reps - 30 second hold

## 2022-01-02 ENCOUNTER — Encounter: Payer: Self-pay | Admitting: Internal Medicine

## 2022-01-03 ENCOUNTER — Other Ambulatory Visit: Payer: Self-pay

## 2022-01-03 ENCOUNTER — Ambulatory Visit (HOSPITAL_COMMUNITY): Payer: Medicare Other | Admitting: Physical Therapy

## 2022-01-03 ENCOUNTER — Encounter (HOSPITAL_COMMUNITY): Payer: Self-pay | Admitting: Physical Therapy

## 2022-01-03 DIAGNOSIS — M542 Cervicalgia: Secondary | ICD-10-CM

## 2022-01-03 NOTE — Therapy (Signed)
[place], [place], [address] Phone: [phone]   Fax:  [phone]  Physical Therapy Treatment  Patient Details  Name: Bruce Weber. MRN: [id number] Date of Birth: 02-09-1951 Referring Provider (PT): Lorine Bears, NP   Encounter Date: 01/03/2022   PT End of Session - 01/03/22 1329     Visit Number 6    Number of Visits 8    Authorization Type UHC Medicare    PT Start Time 0938    PT Stop Time 1829    PT Time Calculation (min) 43 min    Activity Tolerance Patient tolerated treatment well    Behavior During Therapy [facility] for tasks assessed/performed             Past Medical History:  Diagnosis Date   Adenoma 11/2009   1.1 cm from TCS   DM (diabetes mellitus) ([place])    GERD (gastroesophageal reflux disease)    High cholesterol    HTN (hypertension)    Kidney stones    Obesity (BMI 30-39.9) DEC 2011 223 LBS   Rectal bleeding 2011   secondary to hemorrhoids    Past Surgical History:  Procedure Laterality Date   CIRCUMCISION     COLONOSCOPY  Dec 2010 [facility]   MOD IH, SIMPLE ADENOMA 1.1 CM   COLONOSCOPY  11/25/2012   SLF: 1. Sessile polyp measuring 105mm in size was found at the hepatic flesure; polypectomy was performed using snare cautery 2. Moderate diverticulosis was noted in the ascending colon and sigmoid colon. 3. The colon mucosa was otherwise normal 4. Large internal hemorrhoids.    COLONOSCOPY N/A 03/01/2017   Procedure: COLONOSCOPY;  Surgeon: Danie Binder, MD;  [facility];  Service: Endoscopy;  Laterality: N/A;  10:30 AM   EYE SURGERY  05/2018   bilateral cataract surgery   HERNIA REPAIR      There were no vitals filed for this visit.   Subjective Assessment - 01/03/22 1336     Subjective Patient says he is doing good today. Felt good after last visit. Little muscle soreness but doing good overall. No pain currently.    Currently in Pain? No/denies                                [facility] Adult PT Treatment/Exercise - 01/03/22 0001       Neck Exercises: Machines for Strengthening   UBE (Upper Arm Bike) 3/3 FWD/ back      Neck Exercises: Standing   Other Standing Exercises GTB 2 x 10 rows, extension 2 x 10, GTB shoulder ER 10 x 5"      Neck Exercises: Seated   Neck Retraction 15 reps    Other Seated Exercise scap retraction 15 x 3"    Other Seated Exercise thoracic extension and rotation 15 x 3" each      Neck Exercises: Stretches   Corner Stretch 3 reps;20 seconds      Manual Therapy   Manual Therapy --    Manual therapy comments --    Soft tissue mobilization --                       PT Short Term Goals - 12/12/21 1644       PT SHORT TERM GOAL #1   Title Patient will be independent with HEP in order to improve functional outcomes.  Time 2    Period Weeks    Status New    Target Date 12/26/21               PT Long Term Goals - 12/12/21 1645       PT LONG TERM GOAL #1   Title Demonstrate neutral cervical/postural alignment to reduce postural stress/neck pain    Baseline forward head, rounded shoulders    Time 4    Period Weeks    Status New    Target Date 01/09/22      PT LONG TERM GOAL #2   Title Manifest improved symptoms as reported by neck/LUE pain not exceeding 2/10 by end of work day    Baseline 5-6/10 neck pain    Time 4    Period Weeks    Status New    Target Date 01/09/22                   Plan - 01/03/22 1411     Clinical Impression Statement Patient continues to require verbal cues for proper posturing. Required verbal cues and demo for proper arm positioning during band shoulder ER. Patient with good tolerance, no noted increase in pain. Felt good stretch with doorway stretching once corrected for form. Updated HEP. Patient will continue to benefit from skilled therapy services to reduce deficits and improve functional level.    Personal Factors and  Comorbidities Age;Time since onset of injury/illness/exacerbation    Examination-Activity Limitations Sit;Sleep    Examination-Participation Restrictions Occupation    Stability/Clinical Decision Making Stable/Uncomplicated    Rehab Potential Excellent    PT Frequency 2x / week    PT Duration 4 weeks    PT Treatment/Interventions ADLs/Self Care Home Management;Electrical Stimulation;Traction;Therapeutic activities;Therapeutic exercise;Patient/family education;Neuromuscular re-education;Manual techniques;Passive range of motion;Dry needling;Spinal Manipulations;Joint Manipulations;Taping    PT Next Visit Plan Cervical spine, posture, work Research officer, 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    PT Home Exercise Plan chin retraction, supine with towel roll at neck, extension SNAG with towel 1/17 scap retraction 1/26 band rows, extension, UT stretch 1/31 band ER, doorway stretch    Consulted and Agree with Plan of Care Patient             Patient will benefit from skilled therapeutic intervention in order to improve the following deficits and impairments:  Decreased mobility, Decreased range of motion, Hypomobility, Postural dysfunction, Pain  Visit Diagnosis: Neck pain     Problem List Patient Active Problem List   Diagnosis Date Noted   Essential hypertension 09/07/2020   Diet-controlled diabetes mellitus ([place]) 09/07/2020   CKD (chronic kidney disease) stage 4, GFR 15-29 ml/min (HCC) 09/07/2020   SOB (shortness of breath) on exertion 09/07/2020   Foraminal stenosis of lumbar region 05/05/2020   Colon adenomas    Insomnia 01/11/2016   Internal hemorrhoids with complication 47/82/9562   Obstructive sleep apnea 07/27/2013   Seasonal and perennial allergic rhinitis 07/27/2013   Hx of colonic polyps 11/15/2012   UNSPECIFIED DISEASE OF THE JAWS 03/11/2010   COLONIC POLYPS, ADENOMATOUS 06/30/2009   HYPERLIPIDEMIA 10/13/2008   GLOBUS HYSTERICUS 10/13/2008   GASTROESOPHAGEAL REFLUX DISEASE  10/13/2008   RENAL CALCULUS 10/13/2008   Dysphagia, idiopathic 10/13/2008   ABDOMINAL PAIN 10/13/2008   H N P-LUMBAR 06/23/2008   SPINAL STENOSIS 06/23/2008   Chronic midline low back pain without sciatica 06/23/2008   2:17 PM, 01/03/22 Josue Hector PT DPT  Physical Therapist with [facility]  [phone]  [place], [place], [address] Phone: [phone]   Fax:  [phone]  Name: Bruce Weber. MRN: [id number] Date of Birth: Oct 04, 1951

## 2022-01-05 ENCOUNTER — Other Ambulatory Visit: Payer: Self-pay

## 2022-01-05 ENCOUNTER — Ambulatory Visit (HOSPITAL_COMMUNITY): Payer: Medicare Other | Attending: Physical Medicine and Rehabilitation | Admitting: Physical Therapy

## 2022-01-05 DIAGNOSIS — R29898 Other symptoms and signs involving the musculoskeletal system: Secondary | ICD-10-CM | POA: Insufficient documentation

## 2022-01-05 DIAGNOSIS — M542 Cervicalgia: Secondary | ICD-10-CM | POA: Diagnosis present

## 2022-01-05 DIAGNOSIS — R293 Abnormal posture: Secondary | ICD-10-CM | POA: Insufficient documentation

## 2022-01-05 DIAGNOSIS — M545 Low back pain, unspecified: Secondary | ICD-10-CM | POA: Diagnosis present

## 2022-01-05 NOTE — Therapy (Signed)
[place], [place], [address] Phone: [phone]   Fax:  [phone]  Physical Therapy Treatment  Patient Details  Name: Bruce Weber. MRN: [id number] Date of Birth: [birth year] Referring Provider (PT): Lorine Bears, NP   Encounter Date: 01/05/2022   PT End of Session - 01/05/22 1036     Visit Number 7    Number of Visits 8    Authorization Type UHC Medicare    PT Start Time 1004    PT Stop Time 6962    PT Time Calculation (min) 38 min    Activity Tolerance Patient tolerated treatment well    Behavior During Therapy [facility] for tasks assessed/performed             Past Medical History:  Diagnosis Date   Adenoma 11/2009   1.1 cm from TCS   DM (diabetes mellitus) ([place])    GERD (gastroesophageal reflux disease)    High cholesterol    HTN (hypertension)    Kidney stones    Obesity (BMI 30-39.9) DEC 2011 223 LBS   Rectal bleeding 2011   secondary to hemorrhoids    Past Surgical History:  Procedure Laterality Date   CIRCUMCISION     COLONOSCOPY  Dec 2010 [facility]   MOD IH, SIMPLE ADENOMA 1.1 CM   COLONOSCOPY  11/25/2012   SLF: 1. Sessile polyp measuring 104mm in size was found at the hepatic flesure; polypectomy was performed using snare cautery 2. Moderate diverticulosis was noted in the ascending colon and sigmoid colon. 3. The colon mucosa was otherwise normal 4. Large internal hemorrhoids.    COLONOSCOPY N/A 03/01/2017   Procedure: COLONOSCOPY;  Surgeon: Danie Binder, MD;  [facility];  Service: Endoscopy;  Laterality: N/A;  10:30 AM   EYE SURGERY  05/2018   bilateral cataract surgery   HERNIA REPAIR      There were no vitals filed for this visit.   Subjective Assessment - 01/05/22 1010     Subjective pt states he has not had pain in over a week.  States he is complaint with HEP and is more aware of his posture now.    Currently in Pain? No/denies                                [facility] Adult PT Treatment/Exercise - 01/05/22 0001       Neck Exercises: Machines for Strengthening   UBE (Upper Arm Bike) 3/3 FWD/ back      Neck Exercises: Theraband   Scapula Retraction 20 reps;Green    Scapula Retraction Limitations 2 sets of 10 reps GTB    Shoulder Extension 20 reps;Green    Shoulder Extension Limitations 2 sets of 10 reps GTB    Rows 20 reps;Green    Rows Limitations 2 sets of 10 reps GTB    Shoulder External Rotation 20 reps;Green    Shoulder External Rotation Limitations 2 sets of 10 each UE      Neck Exercises: Standing   Wall Push Ups 10 reps    Upper Extremity Flexion with Stabilization 10 reps    UE Flexion with Stabilization Limitations back against wall, good posture    Other Standing Exercises wall arch 10 reps with 3" scapular squeeze      Neck Exercises: Seated   Neck Retraction 15 reps    W Back 15 reps  W Back Limitations 3" holds, scap retraction    Other Seated Exercise thoracic extension and rotation 15 x 3" each      Neck Exercises: Stretches   Corner Stretch 3 reps;20 seconds                       PT Short Term Goals - 12/12/21 1644       PT SHORT TERM GOAL #1   Title Patient will be independent with HEP in order to improve functional outcomes.    Time 2    Period Weeks    Status New    Target Date 12/26/21               PT Long Term Goals - 12/12/21 1645       PT LONG TERM GOAL #1   Title Demonstrate neutral cervical/postural alignment to reduce postural stress/neck pain    Baseline forward head, rounded shoulders    Time 4    Period Weeks    Status New    Target Date 01/09/22      PT LONG TERM GOAL #2   Title Manifest improved symptoms as reported by neck/LUE pain not exceeding 2/10 by end of work day    Baseline 5-6/10 neck pain    Time 4    Period Weeks    Status New    Target Date 01/09/22                   Plan - 01/05/22 1039      Clinical Impression Statement Pt overall improved with more postural awareness and strength.  Continued with established therex in which cues are still needed for general form.  Scapular retraction theraband activity added in addition to others already completing with dues to keep head in neutral positioning avoiding forward flexion with contraction.   Added UE flexion against wall as well as wall arches to work on upper spine strength and posture.    Personal Factors and Comorbidities Age;Time since onset of injury/illness/exacerbation    Examination-Activity Limitations Sit;Sleep    Examination-Participation Restrictions Occupation    Stability/Clinical Decision Making Stable/Uncomplicated    Rehab Potential Excellent    PT Frequency 2x / week    PT Duration 4 weeks    PT Treatment/Interventions ADLs/Self Care Home Management;Electrical Stimulation;Traction;Therapeutic activities;Therapeutic exercise;Patient/family education;Neuromuscular re-education;Manual techniques;Passive range of motion;Dry needling;Spinal Manipulations;Joint Manipulations;Taping    PT Next Visit Plan Cervical spine, posture, work [employer], [employer].  Re-eval next visit with possible discharge.    PT Home Exercise Plan chin retraction, supine with towel roll at neck, extension SNAG with towel 1/17 scap retraction 1/26 band rows, extension, UT stretch 1/31 band ER, doorway stretch    Consulted and Agree with Plan of Care Patient             Patient will benefit from skilled therapeutic intervention in order to improve the following deficits and impairments:  Decreased mobility, Decreased range of motion, Hypomobility, Postural dysfunction, Pain  Visit Diagnosis: Neck pain  Abnormal posture  Other symptoms and signs involving the musculoskeletal system  Bilateral low back pain, unspecified chronicity, unspecified whether sciatica present     Problem List Patient Active Problem List   Diagnosis Date Noted    Essential hypertension 09/07/2020   Diet-controlled diabetes mellitus ([place]) 09/07/2020   CKD (chronic kidney disease) stage 4, GFR 15-29 ml/min (HCC) 09/07/2020   SOB (shortness of breath) on exertion 09/07/2020   Foraminal stenosis of lumbar  region 05/05/2020   Colon adenomas    Insomnia 01/11/2016   Internal hemorrhoids with complication 24/93/2419   Obstructive sleep apnea 07/27/2013   Seasonal and perennial allergic rhinitis 07/27/2013   Hx of colonic polyps 11/15/2012   UNSPECIFIED DISEASE OF THE JAWS 03/11/2010   COLONIC POLYPS, ADENOMATOUS 06/30/2009   HYPERLIPIDEMIA 10/13/2008   GLOBUS HYSTERICUS 10/13/2008   GASTROESOPHAGEAL REFLUX DISEASE 10/13/2008   RENAL CALCULUS 10/13/2008   Dysphagia, idiopathic 10/13/2008   ABDOMINAL PAIN 10/13/2008   H N P-LUMBAR 06/23/2008   SPINAL STENOSIS 06/23/2008   Chronic midline low back pain without sciatica 06/23/2008   Teena Irani, PTA/CLT, WTA [phone]  Teena Irani, PTA 01/05/2022, 10:39 AM  [facility] [address] [place], [place], [address] Phone: [phone]   Fax:  [phone]  Name: Schylar Wuebker. MRN: [id number] Date of Birth: [birth year]

## 2022-01-09 ENCOUNTER — Encounter (HOSPITAL_COMMUNITY): Payer: Medicare Other | Admitting: Physical Therapy

## 2022-01-10 ENCOUNTER — Encounter (HOSPITAL_COMMUNITY): Payer: Medicare Other | Admitting: Physical Therapy

## 2022-01-11 DIAGNOSIS — N401 Enlarged prostate with lower urinary tract symptoms: Secondary | ICD-10-CM | POA: Insufficient documentation

## 2022-01-11 DIAGNOSIS — Z8719 Personal history of other diseases of the digestive system: Secondary | ICD-10-CM | POA: Insufficient documentation

## 2022-01-12 ENCOUNTER — Encounter (HOSPITAL_COMMUNITY): Payer: Medicare Other | Admitting: Physical Therapy

## 2022-01-12 ENCOUNTER — Other Ambulatory Visit: Payer: Self-pay

## 2022-01-12 ENCOUNTER — Encounter (HOSPITAL_COMMUNITY): Payer: Self-pay

## 2022-01-12 ENCOUNTER — Ambulatory Visit (HOSPITAL_COMMUNITY): Payer: Medicare Other

## 2022-01-12 DIAGNOSIS — M545 Low back pain, unspecified: Secondary | ICD-10-CM

## 2022-01-12 DIAGNOSIS — R293 Abnormal posture: Secondary | ICD-10-CM

## 2022-01-12 DIAGNOSIS — M542 Cervicalgia: Secondary | ICD-10-CM | POA: Diagnosis not present

## 2022-01-12 DIAGNOSIS — R29898 Other symptoms and signs involving the musculoskeletal system: Secondary | ICD-10-CM

## 2022-01-12 NOTE — Therapy (Signed)
[place], [place], [address] Phone: [phone]   Fax:  [phone]  Physical Therapy Treatment/Progress Note/Discharge Summary  Patient Details  Name: Bruce Weber. MRN: [id number] Date of Birth: [birth year] Referring Provider (PT): Lorine Bears, NP   Encounter Date: 01/12/2022  PHYSICAL THERAPY DISCHARGE SUMMARY  Visits from Start of Care: 8  Current functional level related to goals / functional outcomes: Has returned to normal activities   Remaining deficits: Forward head/rounded shoulders sustained posturing   Education / Equipment: HEP   Patient agrees to discharge. Patient goals were partially met. Patient is being discharged due to being pleased with the current functional level.    PT End of Session - 01/12/22 1051     Visit Number 8    Number of Visits 8    Authorization Type UHC Medicare    PT Start Time 2774    PT Stop Time 1055    PT Time Calculation (min) 33 min    Activity Tolerance Patient tolerated treatment well    Behavior During Therapy WFL for tasks assessed/performed             Past Medical History:  Diagnosis Date   Adenoma 11/2009   1.1 cm from TCS   DM (diabetes mellitus) ([place])    GERD (gastroesophageal reflux disease)    High cholesterol    HTN (hypertension)    Kidney stones    Obesity (BMI 30-39.9) DEC 2011 223 LBS   Rectal bleeding 2011   secondary to hemorrhoids    Past Surgical History:  Procedure Laterality Date   CIRCUMCISION     COLONOSCOPY  Dec 2010 [facility]   MOD IH, SIMPLE ADENOMA 1.1 CM   COLONOSCOPY  11/25/2012   SLF: 1. Sessile polyp measuring 80m in size was found at the hepatic flesure; polypectomy was performed using snare cautery 2. Moderate diverticulosis was noted in the ascending colon and sigmoid colon. 3. The colon mucosa was otherwise normal 4. Large internal hemorrhoids.    COLONOSCOPY N/A 03/01/2017   Procedure: COLONOSCOPY;   Surgeon: SDanie Binder MD;  Location[facility];  Service: Endoscopy;  Laterality: N/A;  10:30 AM   EYE SURGERY  05/2018   bilateral cataract surgery   HERNIA REPAIR      There were no vitals filed for this visit.   Subjective Assessment - 01/12/22 1024     Subjective 50% better since beginning therapy; doesn't feel posture has improved a whole lot. No pain for 2 days after sitting up watching TV in a bad position.    Currently in Pain? No/denies                [facility]       Assessment   Medical Diagnosis Cervicalgia    Referring Provider (PT) WLorine Bears NP    Hand Dominance Left    Next MD Visit 12/22/21    Prior Therapy not for neck      Posture/Postural Control   Posture/Postural Control Postural limitations    Postural Limitations Forward head;Rounded Shoulders      ROM / Strength   AROM / PROM / Strength AROM      AROM   Cervical Extension 10% limited    Cervical - Right Side Bend 10% limited    Cervical - Left Side Bend 10% limited    Cervical - Right Rotation 10% limited    Cervical -  Left Rotation 10% limited                [facility] Adult PT Treatment/Exercise - 01/12/22 0001       Therapeutic Activites    Therapeutic Activities Work Simulation    Work Simulation lumbar roll, chin tuck, 15 minute timer to check posture and/or switch to standing position to work; sustained posture biggest challenge                 PT Education - 01/12/22 1231     Education Details Advanced HEP. Discussed sustained postures, spinal anatomy.    Person(s) Educated Patient    Methods Explanation;Handout    Comprehension Verbalized understanding              PT Short Term Goals - 01/12/22 1037       PT SHORT TERM GOAL #1   Title Patient will be independent with HEP in order to improve functional outcomes.    Time 2    Period Weeks    Status Achieved    Target Date 12/26/21               PT Long  Term Goals - 01/12/22 1038       PT LONG TERM GOAL #1   Title Demonstrate neutral cervical/postural alignment to reduce postural stress/neck pain    Baseline forward head, rounded shoulders improved but contines to need to work on at home during working and leisure hours    Time 4    Period Weeks    Status Partially Met    Target Date 01/09/22      PT LONG TERM GOAL #2   Title Manifest improved symptoms as reported by neck/LUE pain not exceeding 2/10 by end of work day    Baseline 0/10 cuurently; 2 days ago neck/LUE pain after sleeping upright 3/10 lasting a short time    Time 4    Period Weeks    Status Partially Met    Target Date 01/09/22                   Plan - 01/12/22 1051     Clinical Impression Statement Patient's symptoms are improving with patient reporting reduced neck and low back pain. Patient has met 1/1 short term goals and partially met 2/2 long term goals. Large amount on session focused on educating patient on spinal anatomy and what impaired sustained postures do and symptoms associated. Discussed with patient his home work environments and gave suggestions of lumbar roll, foot stool, 15 minute timer to check posture and potentially transition to a standing work station, use of a TV tray instead of working with his computer on his lap. Reviewed chin tuck in seat position. Patient stated he was wearing a shirt from his chiropractor to keep his shoulder back. PT encourage patient not to rely on posture shirt and to attempt to assume an even straighter posture through the use of his muscles while wearing this shirt. Patient being discharged to an independent home exercises program at this time.    Personal Factors and Comorbidities Age;Time since onset of injury/illness/exacerbation    Examination-Activity Limitations Sit;Sleep    Examination-Participation Restrictions Occupation    Stability/Clinical Decision Making Stable/Uncomplicated    Rehab Potential  Excellent    PT Frequency 2x / week    PT Duration 4 weeks    PT Treatment/Interventions ADLs/Self Care Home Management;Electrical Stimulation;Traction;Therapeutic activities;Therapeutic exercise;Patient/family education;Neuromuscular re-education;Manual techniques;Passive range of motion;Dry needling;Spinal Manipulations;Joint Manipulations;Taping  PT Next Visit Plan DC    PT Home Exercise Plan chin retraction, supine with towel roll at neck, extension SNAG with towel 1/17 scap retraction 1/26 band rows, extension, UT stretch 1/31 band ER, doorway stretch; 2/9 proper sustained posture, lumbar roll, chin tuck, timer to chekc posture    Consulted and Agree with Plan of Care Patient             Patient will benefit from skilled therapeutic intervention in order to improve the following deficits and impairments:  Decreased mobility, Decreased range of motion, Hypomobility, Postural dysfunction, Pain  Visit Diagnosis: Neck pain  Abnormal posture  Other symptoms and signs involving the musculoskeletal system  Bilateral low back pain, unspecified chronicity, unspecified whether sciatica present     Problem List Patient Active Problem List   Diagnosis Date Noted   Essential hypertension 09/07/2020   Diet-controlled diabetes mellitus ([place]) 09/07/2020   CKD (chronic kidney disease) stage 4, GFR 15-29 ml/min ([place]) 09/07/2020   SOB (shortness of breath) on exertion 09/07/2020   Foraminal stenosis of lumbar region 05/05/2020   Colon adenomas    Insomnia 01/11/2016   Internal hemorrhoids with complication 25/18/9842   Obstructive sleep apnea 07/27/2013   Seasonal and perennial allergic rhinitis 07/27/2013   Hx of colonic polyps 11/15/2012   UNSPECIFIED DISEASE OF THE JAWS 03/11/2010   COLONIC POLYPS, ADENOMATOUS 06/30/2009   HYPERLIPIDEMIA 10/13/2008   GLOBUS HYSTERICUS 10/13/2008   GASTROESOPHAGEAL REFLUX DISEASE 10/13/2008   RENAL CALCULUS 10/13/2008   Dysphagia, idiopathic  10/13/2008   ABDOMINAL PAIN 10/13/2008   H N P-LUMBAR 06/23/2008   SPINAL STENOSIS 06/23/2008   Chronic midline low back pain without sciatica 06/23/2008   Pamala Hurry D. Hartnett-Rands, MS, PT Per [place] [phone]  Jeannie Done, PT 01/12/2022, 12:40 PM  [facility] [address] [place], [place], [address] Phone: [phone]   Fax:  [phone]  Name: Bruce Weber. MRN: [id number] Date of Birth: [birth year]

## 2022-01-12 NOTE — Patient Instructions (Addendum)
Posture Awareness    Stand and check posture: Jut chin, pull back to comfortable position. Tilt pelvis forward, back; be sure back is not swayed. Roll from heels to balls of feet, then distribute your weight evenly. Picture a line through spine pulling you erect. Focus on breathing. Good Posture = Better Breathing. Check ____ times per day.  http://gt2.exer.us/873   Copyright  VHI. All rights reserved.   LUMBAR ROLL WITH BATH TOWEL AND RUBBER BANDS. PLACE IN CENTER OF LOW BACK WHEN SEATED TO WORK, DRIVE, WATCH TV.  SET A 15 MINUTE TIMER TO CHECK YOUR POSTURE AND MAYBE GO FROM WORKING IN SITTING TO STANDING.  TURTLE HEAD OUT/ TURTLE HEAD IN THE SHELL.  WORK ON YOUR STATIC POSTURING!!!

## 2022-01-13 ENCOUNTER — Encounter (HOSPITAL_COMMUNITY): Payer: Medicare Other

## 2022-01-25 ENCOUNTER — Other Ambulatory Visit: Payer: Self-pay | Admitting: Orthopedic Surgery

## 2022-01-25 DIAGNOSIS — M545 Low back pain, unspecified: Secondary | ICD-10-CM

## 2022-01-26 ENCOUNTER — Other Ambulatory Visit: Payer: Self-pay

## 2022-01-26 ENCOUNTER — Ambulatory Visit (INDEPENDENT_AMBULATORY_CARE_PROVIDER_SITE_OTHER): Payer: Medicare Other

## 2022-01-26 ENCOUNTER — Ambulatory Visit
Admission: EM | Admit: 2022-01-26 | Discharge: 2022-01-26 | Disposition: A | Discharge status: Home / Self Care | Payer: Medicare Other | Attending: Urgent Care | Admitting: Urgent Care

## 2022-01-26 DIAGNOSIS — N184 Chronic kidney disease, stage 4 (severe): Secondary | ICD-10-CM | POA: Diagnosis not present

## 2022-01-26 DIAGNOSIS — M79671 Pain in right foot: Secondary | ICD-10-CM

## 2022-01-26 DIAGNOSIS — S92354A Nondisplaced fracture of fifth metatarsal bone, right foot, initial encounter for closed fracture: Secondary | ICD-10-CM

## 2022-01-26 MED ORDER — HYDROCODONE-ACETAMINOPHEN 5-325 MG PO TABS: 1.0000 | ORAL_TABLET | Freq: Four times a day (QID) | ORAL | 0 refills | Status: DC | PRN

## 2022-01-26 MED ORDER — ACETAMINOPHEN 325 MG PO TABS: 650.0000 mg | ORAL_TABLET | Freq: Four times a day (QID) | ORAL | 0 refills | Status: AC | PRN

## 2022-01-26 NOTE — Discharge Instructions (Signed)
Please schedule Tylenol for your regular pain control.  Use hydrocodone for breakthrough pain.  Follow-up with a podiatrist as soon as possible.  In the meantime keep the splint on at all times.  Move around using crutches and avoid putting any kind of weight on your foot.

## 2022-01-26 NOTE — ED Provider Notes (Signed)
[place]   MRN: [id number] DOB: December 01, 1951  Subjective:   Bruce Weber. is a 71 y.o. male presenting for 2-day history of acute onset persistent right foot pain with swelling.  Patient suffered an accidental fall as he was going downstairs and hit his foot against the stairs.  He has had increasing pain, difficulty bearing weight.  Has been using Tylenol with some relief.  No head injury, loss of consciousness, confusion.  No bruising but there is swelling.  No current facility-administered medications for this encounter.  Current Outpatient Medications:    acetaminophen (TYLENOL) 500 MG tablet, Take 1,000 mg by mouth 2 (two) times daily as needed for headache., Disp: , Rfl:    allopurinol (ZYLOPRIM) 100 MG tablet, Take 300 mg by mouth daily. , Disp: , Rfl:    amLODipine (NORVASC) 10 MG tablet, Take 10 mg by mouth daily., Disp: , Rfl:    atorvastatin (LIPITOR) 10 MG tablet, TAKE 1 TABLET BY MOUTH EVERY DAY (NEED PHYSICAL), Disp: 90 tablet, Rfl: 1   calcitRIOL (ROCALTROL) 0.25 MCG capsule, Take 0.25 mcg by mouth at bedtime. , Disp: , Rfl:    clomiPHENE (CLOMID) 50 MG tablet, Take 25 mg by mouth at bedtime. , Disp: , Rfl:    cyclobenzaprine (FLEXERIL) 5 MG tablet, TAKE 1 TABLET BY MOUTH AT BEDTIME AS NEEDED FOR MUSCLE SPASMS. (Patient not taking: Reported on 12/26/2021), Disp: 30 tablet, Rfl: 3   famciclovir (FAMVIR) 500 MG tablet, Take 1,000 mg by mouth 2 (two) times daily as needed (fever blisters)., Disp: , Rfl: 5   famotidine (PEPCID) 40 MG tablet, TAKE 1 TABLET (40 MG TOTAL) BY MOUTH DAILY. WITH BREAKFAST, Disp: 90 tablet, Rfl: 3   felodipine (PLENDIL) 10 MG 24 hr tablet, Take 10 mg by mouth daily., Disp: , Rfl:    fluticasone (FLONASE) 50 MCG/ACT nasal spray, Place 1 spray into both nostrils daily for 14 days., Disp: 16 g, Rfl: 0   furosemide (LASIX) 40 MG tablet, Take 40 mg by mouth., Disp: , Rfl:    gabapentin (NEURONTIN) 100 MG capsule, TAKE 1 CAPSULE BY MOUTH  THREE TIMES A DAY, Disp: 90 capsule, Rfl: 2   hydrALAZINE (APRESOLINE) 25 MG tablet, Take 25 mg by mouth 3 (three) times daily., Disp: , Rfl:    propranolol (INDERAL) 80 MG tablet, TAKE 1 TABLET BY MOUTH TWICE A DAY, Disp: 180 tablet, Rfl: 2   psyllium (METAMUCIL) 58.6 % powder, Take 1 packet by mouth daily. As needed for constipation, Disp: , Rfl:    tamsulosin (FLOMAX) 0.4 MG CAPS capsule, Take 0.4 mg by mouth daily., Disp: , Rfl:    traZODone (DESYREL) 50 MG tablet, TAKE 1 TABLET BY MOUTH EVERYDAY AT BEDTIME (Patient not taking: Reported on 12/26/2021), Disp: 90 tablet, Rfl: 2   triamcinolone (KENALOG) 0.025 % ointment, Apply 1 application topically 2 (two) times daily., Disp: 30 g, Rfl: 0   vitamin C (ASCORBIC ACID) 500 MG tablet, Take 500 mg by mouth 2 (two) times a week., Disp: , Rfl:    Allergies  Allergen Reactions   Dye Fdc Blue [Brilliant Blue Fcf (Fd&C Blue #1)]     PT UNSURE WHICH DYE   Iodinated Contrast Media Hives    30 YEARS AGO, recent 2021 epidural with contrast ok    Past Medical History:  Diagnosis Date   Adenoma 11/2009   1.1 cm from TCS   DM (diabetes mellitus) ([place])    GERD (gastroesophageal reflux disease)    High  cholesterol    HTN (hypertension)    Kidney stones    Obesity (BMI 30-39.9) DEC 2011 223 LBS   Rectal bleeding 2011   secondary to hemorrhoids     Past Surgical History:  Procedure Laterality Date   CIRCUMCISION     COLONOSCOPY  Dec 2010 [facility]   MOD IH, SIMPLE ADENOMA 1.1 CM   COLONOSCOPY  11/25/2012   SLF: 1. Sessile polyp measuring 12mm in size was found at the hepatic flesure; polypectomy was performed using snare cautery 2. Moderate diverticulosis was noted in the ascending colon and sigmoid colon. 3. The colon mucosa was otherwise normal 4. Large internal hemorrhoids.    COLONOSCOPY N/A 03/01/2017   Procedure: COLONOSCOPY;  Surgeon: Danie Binder, MD;  [facility];  Service: Endoscopy;  Laterality: N/A;  10:30 AM   EYE SURGERY   05/2018   bilateral cataract surgery   HERNIA REPAIR      Family History  Problem Relation Age of Onset   Lung cancer Father    High blood pressure Father    Colon cancer Neg Hx    Colon polyps Neg Hx     Social History   Tobacco Use   Smoking status: Never   Smokeless tobacco: Never   Tobacco comments:    Never smoked  Vaping Use   Vaping Use: Never used  Substance Use Topics   Alcohol use: No   Drug use: No    ROS   Objective:   Vitals: BP (!) 144/75 (BP Location: Right Arm)    Pulse 64    Temp 98.8 F (37.1 C) (Oral)    Resp 18    SpO2 95%   Physical Exam Constitutional:      General: He is not in acute distress.    Appearance: Normal appearance. He is well-developed and normal weight. He is not ill-appearing, toxic-appearing or diaphoretic.  HENT:     Head: Normocephalic and atraumatic.     Right Ear: External ear normal.     Left Ear: External ear normal.     Nose: Nose normal.     Mouth/Throat:     Pharynx: Oropharynx is clear.  Eyes:     General: No scleral icterus.       Right eye: No discharge.        Left eye: No discharge.     Extraocular Movements: Extraocular movements intact.  Cardiovascular:     Rate and Rhythm: Normal rate.  Pulmonary:     Effort: Pulmonary effort is normal.  Musculoskeletal:     Cervical back: Normal range of motion.       Feet:  Neurological:     Mental Status: He is alert and oriented to person, place, and time.  Psychiatric:        Mood and Affect: Mood normal.        Behavior: Behavior normal.        Thought Content: Thought content normal.        Judgment: Judgment normal.   Right foot x-Kasheem: There is an obvious right fifth metatarsal fracture.  Overread is pending.  Patient placed into a posterior right lower leg splint.  Assessment and Plan :   PDMP not reviewed this encounter.  1. Nondisplaced fracture of fifth metatarsal bone, right foot, initial encounter for closed fracture   2. Right foot pain    3. CKD (chronic kidney disease) stage 4, GFR 15-29 ml/min (HCC)    Patient is to wear  the posterior leg splint at all times, ambulate with crutches and be nonweightbearing.  Recommended scheduling Tylenol and using hydrocodone for only breakthrough pain.  Follow-up with podiatry as soon as possible. Counseled patient on potential for adverse effects with medications prescribed/recommended today, ER and return-to-clinic precautions discussed, patient verbalized understanding.    Jaynee Eagles, PA-C 01/26/22 1226

## 2022-01-26 NOTE — ED Triage Notes (Signed)
Pt reports pain in right foot x 2 days after he fell on the ground when he was going downstairs. States he can not put weight on. Tylenol give some relief.

## 2022-01-31 ENCOUNTER — Ambulatory Visit: Payer: Medicare Other | Admitting: Podiatry

## 2022-01-31 ENCOUNTER — Other Ambulatory Visit: Payer: Self-pay

## 2022-01-31 DIAGNOSIS — S92354A Nondisplaced fracture of fifth metatarsal bone, right foot, initial encounter for closed fracture: Secondary | ICD-10-CM | POA: Diagnosis not present

## 2022-02-01 NOTE — Progress Notes (Signed)
Subjective:   Patient ID: Bruce Weber., male   DOB: 71 y.o.   MRN: [id number]   HPI 71 year old male presents the office today for concerns of right fifth metatarsal base fracture.  He states that he was walking down bleachers at a basketball game twisting his foot where he sustained a fracture.  He was seen at emergency room and placed into a posterior splint which she presents today walking on this with crutches.  No other injuries at the time.  No other concerns.   Review of Systems  All other systems reviewed and are negative.  Past Medical History:  Diagnosis Date   Adenoma 11/2009   1.1 cm from TCS   DM (diabetes mellitus) (HCC)    GERD (gastroesophageal reflux disease)    High cholesterol    HTN (hypertension)    Kidney stones    Obesity (BMI 30-39.9) DEC 2011 223 LBS   Rectal bleeding 2011   secondary to hemorrhoids    Past Surgical History:  Procedure Laterality Date   CIRCUMCISION     COLONOSCOPY  Dec 2010 [facility]   MOD IH, SIMPLE ADENOMA 1.1 CM   COLONOSCOPY  11/25/2012   SLF: 1. Sessile polyp measuring 19mm in size was found at the hepatic flesure; polypectomy was performed using snare cautery 2. Moderate diverticulosis was noted in the ascending colon and sigmoid colon. 3. The colon mucosa was otherwise normal 4. Large internal hemorrhoids.    COLONOSCOPY N/A 03/01/2017   Procedure: COLONOSCOPY;  Surgeon: Danie Binder, MD;  [facility]: [facility];  Service: Endoscopy;  Laterality: N/A;  10:30 AM   EYE SURGERY  05/2018   bilateral cataract surgery   HERNIA REPAIR       Current Outpatient Medications:    acetaminophen (TYLENOL) 325 MG tablet, Take 2 tablets (650 mg total) by mouth every 6 (six) hours as needed for moderate pain., Disp: 30 tablet, Rfl: 0   allopurinol (ZYLOPRIM) 100 MG tablet, Take 300 mg by mouth daily. , Disp: , Rfl:    amLODipine (NORVASC) 10 MG tablet, Take 10 mg by mouth daily., Disp: , Rfl:    atorvastatin (LIPITOR) 10 MG tablet, TAKE 1  TABLET BY MOUTH EVERY DAY (NEED PHYSICAL), Disp: 90 tablet, Rfl: 1   calcitRIOL (ROCALTROL) 0.25 MCG capsule, Take 0.25 mcg by mouth at bedtime. , Disp: , Rfl:    clomiPHENE (CLOMID) 50 MG tablet, Take 25 mg by mouth at bedtime. , Disp: , Rfl:    cyclobenzaprine (FLEXERIL) 5 MG tablet, TAKE 1 TABLET BY MOUTH AT BEDTIME AS NEEDED FOR MUSCLE SPASMS. (Patient not taking: Reported on 12/26/2021), Disp: 30 tablet, Rfl: 3   famciclovir (FAMVIR) 500 MG tablet, Take 1,000 mg by mouth 2 (two) times daily as needed (fever blisters)., Disp: , Rfl: 5   famotidine (PEPCID) 40 MG tablet, TAKE 1 TABLET (40 MG TOTAL) BY MOUTH DAILY. WITH BREAKFAST, Disp: 90 tablet, Rfl: 3   felodipine (PLENDIL) 10 MG 24 hr tablet, Take 10 mg by mouth daily., Disp: , Rfl:    fluticasone (FLONASE) 50 MCG/ACT nasal spray, Place 1 spray into both nostrils daily for 14 days., Disp: 16 g, Rfl: 0   furosemide (LASIX) 40 MG tablet, Take 40 mg by mouth., Disp: , Rfl:    gabapentin (NEURONTIN) 100 MG capsule, TAKE 1 CAPSULE BY MOUTH THREE TIMES A DAY, Disp: 90 capsule, Rfl: 2   hydrALAZINE (APRESOLINE) 25 MG tablet, Take 25 mg by mouth 3 (three) times daily., Disp: ,  Rfl:    HYDROcodone-acetaminophen (NORCO/VICODIN) 5-325 MG tablet, Take 1 tablet by mouth every 6 (six) hours as needed for severe pain., Disp: 15 tablet, Rfl: 0   propranolol (INDERAL) 80 MG tablet, TAKE 1 TABLET BY MOUTH TWICE A DAY, Disp: 180 tablet, Rfl: 2   psyllium (METAMUCIL) 58.6 % powder, Take 1 packet by mouth daily. As needed for constipation, Disp: , Rfl:    tamsulosin (FLOMAX) 0.4 MG CAPS capsule, Take 0.4 mg by mouth daily., Disp: , Rfl:    traZODone (DESYREL) 50 MG tablet, TAKE 1 TABLET BY MOUTH EVERYDAY AT BEDTIME (Patient not taking: Reported on 12/26/2021), Disp: 90 tablet, Rfl: 2   triamcinolone (KENALOG) 0.025 % ointment, Apply 1 application topically 2 (two) times daily., Disp: 30 g, Rfl: 0   vitamin C (ASCORBIC ACID) 500 MG tablet, Take 500 mg by mouth 2  (two) times a week., Disp: , Rfl:   Allergies  Allergen Reactions   Dye Fdc Blue [Brilliant Blue Fcf (Fd&C Blue #1)]     PT UNSURE WHICH DYE   Iodinated Contrast Media Hives    30 YEARS AGO, recent 2021 epidural with contrast ok          Objective:  Physical Exam  General: AAO x3, NAD  Dermatological: Skin is warm, dry and supple bilateral.  There are no open sores, no preulcerative lesions, no rash or signs of infection present.  Vascular: Dorsalis Pedis artery and Posterior Tibial artery pedal pulses are 2/4 bilateral with immedate capillary fill time. There is no pain with calf compression, swelling, warmth, erythema.   Neruologic: Grossly intact via light touch bilateral.   Musculoskeletal: The majority tenderness is localized to the right fifth metatarsal base.  Some mild diffuse discomfort of the dorsal midfoot where there is edema present.  No other areas of specific pinpoint tenderness.  Muscular strength 5/5 in all groups tested bilateral.  Gait: Unassisted, Nonantalgic.      Assessment:   Fifth metatarsal base fracture without displacement right foot     Plan:  -Treatment options discussed including all alternatives, risks, and complications -Etiology of symptoms were discussed -Independently reviewed the x-rays which revealed nondisplaced fracture of the fifth metatarsal base. -We discussed both conservative as well as surgical options.  Organ to try to treat this conservatively.  Placed into a cam boot today for immobilization.  He can continue the use of crutches.  -Ice/elevation  Return in about 2 weeks (around 02/14/2022) for metatarsal fracture, x-Jhace.  Trula Slade DPM

## 2022-02-09 ENCOUNTER — Encounter: Payer: Self-pay | Admitting: *Deleted

## 2022-02-17 ENCOUNTER — Ambulatory Visit: Payer: Medicare Other | Admitting: Podiatry

## 2022-02-17 ENCOUNTER — Ambulatory Visit (INDEPENDENT_AMBULATORY_CARE_PROVIDER_SITE_OTHER): Payer: Medicare Other

## 2022-02-17 ENCOUNTER — Other Ambulatory Visit: Payer: Self-pay

## 2022-02-17 DIAGNOSIS — S92354A Nondisplaced fracture of fifth metatarsal bone, right foot, initial encounter for closed fracture: Secondary | ICD-10-CM | POA: Diagnosis not present

## 2022-02-17 DIAGNOSIS — S92354D Nondisplaced fracture of fifth metatarsal bone, right foot, subsequent encounter for fracture with routine healing: Secondary | ICD-10-CM

## 2022-02-21 NOTE — Progress Notes (Signed)
Subjective: ?Evaluation of right foot fifth metatarsal base fracture.  He is still been using the boot but does walk around the house without the boot some.  He states he is doing well not having significant pain.  Some mild swelling but this is improving as well.  No fevers or chills or any other concerns today. ? ?Objective: ?AAO x3, NAD ?DP/PT pulses palpable bilaterally, CRT less than 3 seconds ?There is mild tenderness palpation to the fifth metatarsal base but improving.  There is no other significant areas of tenderness.  There is decreased edema.  No erythema or warmth.  No open lesions.  MMT 5/5 ?No pain with calf compression, swelling, warmth, erythema ? ?Assessment: ?71 year old male with right fifth metatarsal base fracture ? ?Plan: ?-All treatment options discussed with the patient including all alternatives, risks, complications.  ?-X-rays obtained and reviewed.  Fracture of the fifth metatarsal base.  There is some displacement noted today compared to last x-rays. ?-Again discussed with conservative as well as surgical options.  He wants to continue with conservative care.  Discussed that there is nonhealing in the future consider surgical intervention.  Discussed the need to wear the cam boot immobilization at all times.  Continue to ice and elevate.  Ace bandage for compression. ?-Patient encouraged to call the office with any questions, concerns, change in symptoms.  ? ?Return in about 3 weeks (around 03/10/2022).  X-Rorik next appointment ? ?Trula Slade DPM ? ? ?

## 2022-03-02 ENCOUNTER — Ambulatory Visit
Admission: EM | Admit: 2022-03-02 | Discharge: 2022-03-02 | Disposition: A | Discharge status: Home / Self Care | Payer: Medicare Other | Attending: Family Medicine | Admitting: Family Medicine

## 2022-03-02 DIAGNOSIS — R112 Nausea with vomiting, unspecified: Secondary | ICD-10-CM | POA: Diagnosis not present

## 2022-03-02 DIAGNOSIS — R1012 Left upper quadrant pain: Secondary | ICD-10-CM

## 2022-03-02 MED ORDER — ONDANSETRON 4 MG PO TBDP
4.0000 mg | ORAL_TABLET | Freq: Once | ORAL | Status: AC
  Administered 2022-03-02: 4 mg via ORAL

## 2022-03-02 MED ORDER — ONDANSETRON 4 MG PO TBDP: 4.0000 mg | ORAL_TABLET | Freq: Three times a day (TID) | ORAL | 0 refills | Status: DC | PRN

## 2022-03-02 NOTE — ED Triage Notes (Signed)
Pt states he is hurting on his left side in the rib area that started today ? ?Pt states he has vomited 3 times today and having chills ? ? ? ? ?

## 2022-03-02 NOTE — ED Provider Notes (Signed)
?[facility] ? ? ? ?CSN: [id number] ?Arrival date & time: 03/02/22  1454 ? ? ?  ? ?History   ?Chief Complaint ?Chief Complaint  ?Patient presents with  ? Abdominal Pain  ?  Vomit and abdominal pain  ? ? ?HPI ?Bruce Delma Officer. is a 71 y.o. male.  ? ?Presenting today with 1 day history of left upper quadrant abdominal pain, nausea, vomiting, chills, fatigue.  Denies constipation, diarrhea, known fever, congestion, cough, sore throat.  No new foods or medications, no recent travel, no sick contacts.  History of acid reflux, otherwise no known chronic GI issues. ? ? ?Past Medical History:  ?Diagnosis Date  ? Adenoma 11/2009  ? 1.1 cm from TCS  ? DM (diabetes mellitus) ([place])   ? GERD (gastroesophageal reflux disease)   ? High cholesterol   ? HTN (hypertension)   ? Kidney stones   ? Obesity (BMI 30-39.9) DEC 2011 223 LBS  ? Rectal bleeding 2011  ? secondary to hemorrhoids  ? ? ?Patient Active Problem List  ? Diagnosis Date Noted  ? Essential hypertension 09/07/2020  ? Diet-controlled diabetes mellitus ([place]) 09/07/2020  ? CKD (chronic kidney disease) stage 4, GFR 15-29 ml/min (HCC) 09/07/2020  ? SOB (shortness of breath) on exertion 09/07/2020  ? Foraminal stenosis of lumbar region 05/05/2020  ? Colon adenomas   ? Insomnia 01/11/2016  ? Internal hemorrhoids with complication 11/91/4782  ? Obstructive sleep apnea 07/27/2013  ? Seasonal and perennial allergic rhinitis 07/27/2013  ? Hx of colonic polyps 11/15/2012  ? UNSPECIFIED DISEASE OF THE JAWS 03/11/2010  ? COLONIC POLYPS, ADENOMATOUS 06/30/2009  ? HYPERLIPIDEMIA 10/13/2008  ? GLOBUS HYSTERICUS 10/13/2008  ? GASTROESOPHAGEAL REFLUX DISEASE 10/13/2008  ? RENAL CALCULUS 10/13/2008  ? Dysphagia, idiopathic 10/13/2008  ? ABDOMINAL PAIN 10/13/2008  ? H N P-LUMBAR 06/23/2008  ? SPINAL STENOSIS 06/23/2008  ? Chronic midline low back pain without sciatica 06/23/2008  ? ? ?Past Surgical History:  ?Procedure Laterality Date  ? CIRCUMCISION    ? COLONOSCOPY  Dec 2010  [facility]  ? MOD IH, SIMPLE ADENOMA 1.1 CM  ? COLONOSCOPY  11/25/2012  ? SLF: 1. Sessile polyp measuring 43m in size was found at the hepatic flesure; polypectomy was performed using snare cautery 2. Moderate diverticulosis was noted in the ascending colon and sigmoid colon. 3. The colon mucosa was otherwise normal 4. Large internal hemorrhoids.   ? COLONOSCOPY N/A 03/01/2017  ? Procedure: COLONOSCOPY;  Surgeon: SDanie Binder MD;  [facility];  Service: Endoscopy;  Laterality: N/A;  10:30 AM  ? EYE SURGERY  05/2018  ? bilateral cataract surgery  ? HERNIA REPAIR    ? ? ? ? ? ?Home Medications   ? ?Prior to Admission medications   ?Medication Sig Start Date End Date Taking? Authorizing Provider  ?ondansetron (ZOFRAN-ODT) 4 MG disintegrating tablet Take 1 tablet (4 mg total) by mouth every 8 (eight) hours as needed for nausea or vomiting. 03/02/22  Yes LVolney American PA-C  ?acetaminophen (TYLENOL) 325 MG tablet Take 2 tablets (650 mg total) by mouth every 6 (six) hours as needed for moderate pain. 01/26/22   MJaynee Eagles PA-C  ?allopurinol (ZYLOPRIM) 100 MG tablet Take 300 mg by mouth daily.     [provider]  ?amLODipine (NORVASC) 10 MG tablet Take 10 mg by mouth daily.    [provider]  ?atorvastatin (LIPITOR) 10 MG tablet TAKE 1 TABLET BY MOUTH EVERY DAY (NEED PHYSICAL) 12/07/21   BGrier Mitts  R, MD  ?calcitRIOL (ROCALTROL) 0.25 MCG capsule Take 0.25 mcg by mouth at bedtime.     [provider]  ?clomiPHENE (CLOMID) 50 MG tablet Take 25 mg by mouth at bedtime.     [provider]  ?cyclobenzaprine (FLEXERIL) 5 MG tablet TAKE 1 TABLET BY MOUTH AT BEDTIME AS NEEDED FOR MUSCLE SPASMS. ?Patient not taking: Reported on 12/26/2021 05/30/21   Billie Ruddy, MD  ?famciclovir [facility]) 500 MG tablet Take 1,000 mg by mouth 2 (two) times daily as needed (fever blisters). 02/19/17   [provider]  ?famotidine (PEPCID) 40 MG tablet TAKE 1 TABLET (40 MG TOTAL) BY  MOUTH DAILY. WITH BREAKFAST 11/22/21   Annitta Needs, NP  ?felodipine (PLENDIL) 10 MG 24 hr tablet Take 10 mg by mouth daily.    [provider]  ?fluticasone (FLONASE) 50 MCG/ACT nasal spray Place 1 spray into both nostrils daily for 14 days. 09/29/20 10/13/20  Emerson Monte, FNP  ?furosemide (LASIX) 40 MG tablet Take 40 mg by mouth.    [provider]  ?gabapentin (NEURONTIN) 100 MG capsule TAKE 1 CAPSULE BY MOUTH THREE TIMES A DAY 01/25/22   Carole Civil, MD  ?hydrALAZINE (APRESOLINE) 25 MG tablet Take 25 mg by mouth 3 (three) times daily.    [provider]  ?HYDROcodone-acetaminophen (NORCO/VICODIN) 5-325 MG tablet Take 1 tablet by mouth every 6 (six) hours as needed for severe pain. 01/26/22   Jaynee Eagles, PA-C  ?propranolol (INDERAL) 80 MG tablet TAKE 1 TABLET BY MOUTH TWICE A DAY 10/24/21   Billie Ruddy, MD  ?psyllium (METAMUCIL) 58.6 % powder Take 1 packet by mouth daily. As needed for constipation    [provider]  ?tamsulosin (FLOMAX) 0.4 MG CAPS capsule Take 0.4 mg by mouth daily. 05/25/20   [provider]  ?traZODone (DESYREL) 50 MG tablet TAKE 1 TABLET BY MOUTH EVERYDAY AT BEDTIME ?Patient not taking: Reported on 12/26/2021 09/02/21   Baird Lyons D, MD  ?triamcinolone (KENALOG) 0.025 % ointment Apply 1 application topically 2 (two) times daily. 09/07/20   Billie Ruddy, MD  ?vitamin C (ASCORBIC ACID) 500 MG tablet Take 500 mg by mouth 2 (two) times a week.    [provider]  ?cetirizine (ZYRTEC ALLERGY) 10 MG tablet Take 1 tablet (10 mg total) by mouth daily. 09/29/20 09/29/20  Emerson Monte, FNP  ? ? ?Family History ?Family History  ?Problem Relation Age of Onset  ? Lung cancer Father   ? High blood pressure Father   ? Colon cancer Neg Hx   ? Colon polyps Neg Hx   ? ? ?Social History ?Social History  ? ?Tobacco Use  ? Smoking status: Never  ?  Passive exposure: Never  ? Smokeless tobacco: Never  ? Tobacco comments:  ?   Never smoked  ?Vaping Use  ? Vaping Use: Never used  ?Substance Use Topics  ? Alcohol use: No  ? Drug use: No  ? ? ? ?Allergies   ?Dye fdc blue [brilliant blue fcf (fd&c blue #1)] and Iodinated contrast media ? ? ?Review of Systems ?Review of Systems ?Per HPI ? ?Physical Exam ?Triage Vital Signs ?ED Triage Vitals  ?Enc Vitals Group  ?   BP 03/02/22 1540 (!) 165/77  ?   Pulse Rate 03/02/22 1540 60  ?   Resp 03/02/22 1540 16  ?   Temp 03/02/22 1540 97.9 ?F (36.6 ?C)  ?   Temp Source 03/02/22 1540 Oral  ?  SpO2 03/02/22 1540 97 %  ?   Weight --   ?   Height --   ?   Head Circumference --   ?   Peak Flow --   ?   Pain Score 03/02/22 1538 6  ?   Pain Loc --   ?   Pain Edu? --   ?   Excl. in [place]? --   ? ?No data found. ? ?Updated Vital Signs ?BP (!) 165/77 (BP Location: Right Arm)   Pulse 60   Temp 97.9 ?F (36.6 ?C) (Oral)   Resp 16   SpO2 97%  ? ?Visual Acuity ?Right Eye Distance:   ?Left Eye Distance:   ?Bilateral Distance:   ? ?Right Eye Near:   ?Left Eye Near:    ?Bilateral Near:    ? ?Physical Exam ?Vitals and nursing note reviewed.  ?Constitutional:   ?   Appearance: Normal appearance.  ?HENT:  ?   Head: Atraumatic.  ?   Nose: Nose normal.  ?   Mouth/Throat:  ?   Mouth: Mucous membranes are moist.  ?Eyes:  ?   Extraocular Movements: Extraocular movements intact.  ?   Conjunctiva/sclera: Conjunctivae normal.  ?Cardiovascular:  ?   Rate and Rhythm: Normal rate and regular rhythm.  ?Pulmonary:  ?   Effort: Pulmonary effort is normal.  ?   Breath sounds: Normal breath sounds. No wheezing or rales.  ?Abdominal:  ?   General: Bowel sounds are normal. There is no distension.  ?   Palpations: Abdomen is soft.  ?   Tenderness: There is abdominal tenderness. There is no right CVA tenderness, left CVA tenderness or guarding.  ?   Comments: Minimal left upper quadrant tenderness to palpation without distention or guarding  ?Musculoskeletal:     ?   General: Normal range of motion.  ?   Cervical back: Normal range of motion  and neck supple.  ?Skin: ?   General: Skin is warm and dry.  ?Neurological:  ?   General: No focal deficit present.  ?   Mental Status: He is oriented to person, place, and time.  ?   Motor: No weakness.  ?   [place]

## 2022-03-08 ENCOUNTER — Ambulatory Visit: Payer: Medicare Other | Admitting: Internal Medicine

## 2022-03-08 ENCOUNTER — Telehealth: Payer: Self-pay

## 2022-03-08 ENCOUNTER — Other Ambulatory Visit: Payer: Self-pay

## 2022-03-08 VITALS — BP 116/60 | HR 54 | Temp 97.3°F | Ht 69.0 in | Wt 209.6 lb

## 2022-03-08 DIAGNOSIS — D126 Benign neoplasm of colon, unspecified: Secondary | ICD-10-CM

## 2022-03-08 DIAGNOSIS — K219 Gastro-esophageal reflux disease without esophagitis: Secondary | ICD-10-CM | POA: Diagnosis not present

## 2022-03-08 MED ORDER — PEG 3350-KCL-NA BICARB-NACL 420 G PO SOLR: 4000.0000 mL | ORAL | 0 refills | Status: DC

## 2022-03-08 NOTE — Patient Instructions (Signed)
Continue on famotidine for your chronic reflux. ? ?We will schedule you for colonoscopy for surveillance purposes given your history of polyps. ? ?Otherwise follow-up with GI in 1 year or sooner if needed. ? ?It was very nice seeing you again today. ? ?Dr. Abbey Chatters ? ?At [facility] we value your feedback. You may receive a survey about your visit today. Please share your experience as we strive to create trusting relationships with our patients to provide genuine, compassionate, quality care. ? ?We appreciate your understanding and patience as we review any laboratory studies, imaging, and other diagnostic tests that are ordered as we care for you. Our office policy is 5 business days for review of these results, and any emergent or urgent results are addressed in a timely manner for your best interest. If you do not hear from our office in 1 week, please contact us.  ? ?We also encourage the use of MyChart, which contains your medical information for your review as well. If you are not enrolled in this feature, an access code is on this after visit summary for your convenience. Thank you for allowing [place] to be involved in your care. ? ?It was great to see you today!  I hope you have a great rest of your Spring! ? ? ? ?Bruce Weber. Abbey Chatters, D.O. ?Gastroenterology and Hepatology ?[facility] ? ?

## 2022-03-08 NOTE — Telephone Encounter (Signed)
PA for TCS submitted via [facility] website. PA# [id number], valid 04/14/22-07/13/22. ?

## 2022-03-08 NOTE — Progress Notes (Signed)
? ? ?Referring Provider: Billie Ruddy, MD ?Primary Care Physician:  Billie Ruddy, MD ?Primary GI:  Dr. Abbey Chatters ? ?CC: Reflux, colonoscopy recall ? ? ?HPI:   ?Bruce Weber. is a 71 y.o. male who presents to the clinic today for yearly follow-up.  Medical history includes end-stage renal disease awaiting transplant, not on hemodialysis.  OSA, obesity, hypertension.   ? ?He has chronic reflux which is well maintained on famotidine 40 mg daily.  States he does not take this on weekends as he was told previously not to.  States he does not notice a difference in his symptoms on the days he does not take the medication.  No dysphagia or odynophagia.  No unintentional weight loss.  No abdominal pain.  No melena hematochezia. ? ?Does have history of adenomatous colon polyps.  Last colonoscopy 03/01/2017 with multiple tubular adenomas removed.  Colonoscopy recall now ? ?Past Medical History:  ?Diagnosis Date  ? Adenoma 11/2009  ? 1.1 cm from TCS  ? DM (diabetes mellitus) ([place])   ? GERD (gastroesophageal reflux disease)   ? High cholesterol   ? HTN (hypertension)   ? Kidney stones   ? Obesity (BMI 30-39.9) DEC 2011 223 LBS  ? Rectal bleeding 2011  ? secondary to hemorrhoids  ? ? ?Past Surgical History:  ?Procedure Laterality Date  ? CIRCUMCISION    ? COLONOSCOPY  Dec 2010 [facility]  ? MOD IH, SIMPLE ADENOMA 1.1 CM  ? COLONOSCOPY  11/25/2012  ? SL[facility]: 1. Sessile polyp measuring 4m in size was found at the hepatic flesure; polypectomy was performed using snare cautery 2. Moderate diverticulosis was noted in the ascending colon and sigmoid colon. 3. The colon mucosa was otherwise normal 4. Large internal hemorrhoids.   ? COLONOSCOPY N/A 03/01/2017  ? Procedure: COLONOSCOPY;  Surgeon: SDanie Binder MD;  Location: [facility];  Service: Endoscopy;  Laterality: N/A;  10:30 AM  ? EYE SURGERY  05/2018  ? bilateral cataract surgery  ? HERNIA REPAIR    ? ? ?Current Outpatient Medications  ?Medication Sig Dispense Refill  ?  acetaminophen (TYLENOL) 325 MG tablet Take 2 tablets (650 mg total) by mouth every 6 (six) hours as needed for moderate pain. 30 tablet 0  ? allopurinol (ZYLOPRIM) 100 MG tablet Take 300 mg by mouth daily.     ? amLODipine (NORVASC) 10 MG tablet Take 10 mg by mouth daily.    ? atorvastatin (LIPITOR) 10 MG tablet TAKE 1 TABLET BY MOUTH EVERY DAY (NEED PHYSICAL) 90 tablet 1  ? calcitRIOL (ROCALTROL) 0.25 MCG capsule Take 0.25 mcg by mouth at bedtime.     ? clomiPHENE (CLOMID) 50 MG tablet Take 25 mg by mouth at bedtime.     ? famciclovir (FAMVIR) 500 MG tablet Take 1,000 mg by mouth 2 (two) times daily as needed (fever blisters).  5  ? famotidine (PEPCID) 40 MG tablet TAKE 1 TABLET (40 MG TOTAL) BY MOUTH DAILY. WITH BREAKFAST 90 tablet 3  ? felodipine (PLENDIL) 10 MG 24 hr tablet Take 10 mg by mouth daily.    ? furosemide (LASIX) 40 MG tablet Take 40 mg by mouth.    ? gabapentin (NEURONTIN) 100 MG capsule TAKE 1 CAPSULE BY MOUTH THREE TIMES A DAY 90 capsule 2  ? hydrALAZINE (APRESOLINE) 25 MG tablet Take 25 mg by mouth 3 (three) times daily.    ? HYDROcodone-acetaminophen (NORCO/VICODIN) 5-325 MG tablet Take 1 tablet by mouth every 6 (six) hours as needed  for severe pain. 15 tablet 0  ? ondansetron (ZOFRAN-ODT) 4 MG disintegrating tablet Take 1 tablet (4 mg total) by mouth every 8 (eight) hours as needed for nausea or vomiting. 20 tablet 0  ? propranolol (INDERAL) 80 MG tablet TAKE 1 TABLET BY MOUTH TWICE A DAY 180 tablet 2  ? psyllium (METAMUCIL) 58.6 % powder Take 1 packet by mouth daily. As needed for constipation    ? tamsulosin (FLOMAX) 0.4 MG CAPS capsule Take 0.4 mg by mouth daily.    ? vitamin C (ASCORBIC ACID) 500 MG tablet Take 500 mg by mouth 2 (two) times a week.    ? cyclobenzaprine (FLEXERIL) 5 MG tablet TAKE 1 TABLET BY MOUTH AT BEDTIME AS NEEDED FOR MUSCLE SPASMS. (Patient not taking: Reported on 12/26/2021) 30 tablet 3  ? fluticasone (FLONASE) 50 MCG/ACT nasal spray Place 1 spray into both nostrils  daily for 14 days. 16 g 0  ? traZODone (DESYREL) 50 MG tablet TAKE 1 TABLET BY MOUTH EVERYDAY AT BEDTIME (Patient not taking: Reported on 12/26/2021) 90 tablet 2  ? triamcinolone (KENALOG) 0.025 % ointment Apply 1 application topically 2 (two) times daily. (Patient not taking: Reported on 03/08/2022) 30 g 0  ? ?No current facility-administered medications for this visit.  ? ? ?Allergies as of 03/08/2022 - Review Complete 03/08/2022  ?Allergen Reaction Noted  ? Dye fdc blue [brilliant blue fcf (fd&c blue #1)]  06/12/2011  ? Iodinated contrast media Hives 06/12/2011  ? ? ?Family History  ?Problem Relation Age of Onset  ? Lung cancer Father   ? High blood pressure Father   ? Colon cancer Neg Hx   ? Colon polyps Neg Hx   ? ? ?Social History  ? ?Socioeconomic History  ? Marital status: Married  ?  Spouse name: Not on file  ? Number of children: 0  ? Years of education: Not on file  ? Highest education level: Not on file  ?Occupational History  ? Occupation: Chief Strategy Officer for [employer]  ?  Comment: retired  ? Occupation: Scientist, clinical (histocompatibility and immunogenetics)  ?  Comment: full time from home  ? 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  Comment: full time  ?Tobacco Use  ? Smoking status: Never  ?  Passive exposure: Never  ? Smokeless tobacco: Never  ? Tobacco comments:  ?  Never smoked  ?Vaping Use  ? Vaping Use: Never used  ?Substance and Sexual Activity  ? Alcohol use: No  ? Drug use: No  ? Sexual activity: Never  ?  Birth control/protection: None  ?Other Topics Concern  ? Not on file  ?Social History Narrative  ? NO KIDS. MARRIED TO 2ND WIFE.  ?   ? 02/06/2019: Lives with mother at this time assisting in her care, while wife lives nearby, taking care of her own parents.  ? Works from home full time, but planning on retiring in next month to focus on health/well-being  ? Used to work out on treadmill, but has not in recent months  ? Motivated to return to exercise regimine and better diet control  ? Enjoys watching basketball, football  ?   ? ?Social  Determinants of Health  ? ?Financial Resource Strain: Not on file  ?Food Insecurity: Not on file  ?Transportation Needs: No Transportation Needs  ? Lack of Transportation (Medical): No  ? Lack of Transportation (Non-Medical): No  ?Physical Activity: Inactive  ? Days of Exercise per Week: 0 days  ? Minutes of Exercise per Session: 0 min  ?Stress:  Not on file  ?Social Connections: Socially Integrated  ? Frequency of Communication with Friends and Family: Three times a week  ? Frequency of Social Gatherings with Friends and Family: Three times a week  ? Attends Religious Services: More than 4 times per year  ? Active Member of Clubs or Organizations: Yes  ? Attends Archivist Meetings: More than 4 times per year  ? Marital Status: Married  ? ? ?Subjective: ?Review of Systems  ?Constitutional:  Negative for chills and fever.  ?HENT:  Negative for congestion and hearing loss.   ?Eyes:  Negative for blurred vision and double vision.  ?Respiratory:  Negative for cough and shortness of breath.   ?Cardiovascular:  Negative for chest pain and palpitations.  ?Gastrointestinal:  Positive for heartburn. Negative for abdominal pain, blood in stool, constipation, diarrhea, melena and vomiting.  ?Genitourinary:  Negative for dysuria and urgency.  ?Musculoskeletal:  Negative for joint pain and myalgias.  ?Skin:  Negative for itching and rash.  ?Neurological:  Negative for dizziness and headaches.  ?Psychiatric/Behavioral:  Negative for depression. The patient is not nervous/anxious.   ? ? ?Objective: ?BP 116/60   Pulse (!) 54   Temp (!) 97.3 ?F (36.3 ?C)   Ht '5\' 9"'$  (1.753 m)   Wt 209 lb 9.6 oz (95.1 kg)   BMI 30.95 kg/m?  ?Physical Exam ?Constitutional:   ?   Appearance: Normal appearance.  ?HENT:  ?   Head: Normocephalic and atraumatic.  ?Eyes:  ?   Extraocular Movements: Extraocular movements intact.  ?   Conjunctiva/sclera: Conjunctivae normal.  ?Cardiovascular:  ?   Rate and Rhythm: Normal rate and regular rhythm.   ?   Heart sounds: Murmur heard.  ?Pulmonary:  ?   Effort: Pulmonary effort is normal.  ?   Breath sounds: Normal breath sounds.  ?Abdominal:  ?   General: Bowel sounds are normal.  ?   Palpations: Abdomen is soft.  ?Mus

## 2022-03-09 ENCOUNTER — Ambulatory Visit: Payer: Medicare Other | Admitting: Podiatry

## 2022-03-09 ENCOUNTER — Ambulatory Visit (INDEPENDENT_AMBULATORY_CARE_PROVIDER_SITE_OTHER): Payer: Medicare Other

## 2022-03-09 DIAGNOSIS — S92354D Nondisplaced fracture of fifth metatarsal bone, right foot, subsequent encounter for fracture with routine healing: Secondary | ICD-10-CM

## 2022-03-11 NOTE — Progress Notes (Signed)
Subjective: ?71 year old male presents the office today for follow evaluation of right fifth metatarsal base fracture.  He states that he is not having any significant pain.  He had 1 day where he had discomfort but other than that he has been doing well.  He wears the cam boot the majority of the time.  No new injuries or concerns.   ? ?Objective: ?AAO x3, NAD ?DP/PT pulses palpable bilaterally, CRT less than 3 seconds ?Not able to elicit any area of tenderness particular there is no pain on the fifth metatarsal base on the right foot.  No pain on the course the peroneal tendon.  Trace edema present to the area but there is no erythema or warmth.  MMT 5/5.   ?No pain with calf compression, swelling, warmth, erythema ? ?Assessment: ?Fifth metatarsal fracture ? ?Plan: ?-All treatment options discussed with the patient including all alternatives, risks, complications.  ?-Repeat x-rays were obtained and reviewed.  3 views of the right foot were obtained.  The lucency still noted along the fifth metatarsal base consistent with fracture.  There is some slight increased consolidation noted. ?-Continue cam boot for now.  Continue ice, elevate as well as compression.  Desires to hold off any surgical intervention.  We will continue to monitor. ?-Patient encouraged to call the office with any questions, concerns, change in symptoms.  ? ?Return in about 4 weeks (around 04/06/2022) for Metatarsal fracture, x-[facility]. ? ?Trula Slade DPM ? ?

## 2022-04-07 ENCOUNTER — Ambulatory Visit (INDEPENDENT_AMBULATORY_CARE_PROVIDER_SITE_OTHER): Payer: Medicare Other

## 2022-04-07 ENCOUNTER — Ambulatory Visit: Payer: Medicare Other | Admitting: Podiatry

## 2022-04-07 DIAGNOSIS — S92354D Nondisplaced fracture of fifth metatarsal bone, right foot, subsequent encounter for fracture with routine healing: Secondary | ICD-10-CM

## 2022-04-10 NOTE — Progress Notes (Signed)
Subjective: ?71 year old male presents the office today for follow evaluation of right fifth metatarsal base fracture.  He states he has been doing well not having any significant pain.  Minimal swelling.  He uses the cam boot the majority of the time he does state that he goes without the boot at times as well.  No new concerns or injuries.    ? ?Objective: ?AAO x3, NAD ?DP/PT pulses palpable bilaterally, CRT less than 3 seconds ?Not able to elicit any area of tenderness particular there is no pain on the fifth metatarsal base on the right foot.  There is trace edema.  No significant normal course of the peroneal tendon.  There is no other areas of discomfort noted today.  MMT 5/5. ?No pain with calf compression, swelling, warmth, erythema ? ?Assessment: ?Fifth metatarsal fracture ? ?Plan: ?-All treatment options discussed with the patient including all alternatives, risks, complications.  ?-Repeat x-rays were obtained and reviewed.  3 views of the right foot were obtained.  The lucency still noted along the fifth metatarsal base consistent with fracture.  There does appear to be some increased consolidation noted. ?-Recommend continue immobilization in the cam boot although we discussed over the next couple weeks as continues to improve discussed gradual transition to regular shoe as tolerated.  Continue ice, elevate as well as compression of any edema that is residual. ? ?Return in about 4 weeks (around 05/05/2022). ? ?Trula Slade DPM ? ?

## 2022-04-11 ENCOUNTER — Telehealth: Payer: Self-pay | Admitting: *Deleted

## 2022-04-11 NOTE — Patient Instructions (Signed)
? ? ? ? ? ? [facility]. ? 04/11/2022  ?  ? '@PREFPERIOPPHARMACY'$ @ ? ? Your procedure is scheduled on  04/14/2022. ? ? Report to [facility] at  0900  A.M. ? ? Call this number if you have problems the morning of surgery: ? [phone] ? ? Remember: ? Follow the diet and prep instructions given to you by the office. ? ?   DO NOT take any medications for diabetes the morning of your procedure. ?  ? Take these medicines the morning of surgery with A SIP OF WATER  ? ?  allopurinol, amlodipine, pepcid, gabapentin, hydrocodone(If needed), propranolol, flomax. ?  ? Do not wear jewelry, make-up or nail polish. ? Do not wear lotions, powders, or perfumes, or deodorant. ? Do not shave 48 hours prior to surgery.  Men may shave face and neck. ? Do not bring valuables to the hospital. ? [facility] is not responsible for any belongings or valuables. ? ?Contacts, dentures or bridgework may not be worn into surgery.  Leave your suitcase in the car.  After surgery it may be brought to your room. ? ?For patients admitted to the hospital, discharge time will be determined by your treatment team. ? ?Patients discharged the day of surgery will not be allowed to drive home and must have someone with them for 24 hours.  ? ? ?Special instructions:   DO NOT smoke tobacco or vape for 24 hours before your procedure. ? ?Please read over the following fact sheets that you were given. ?Anesthesia Post-op Instructions and Care and Recovery After Surgery ?  ? ? ? Colonoscopy, Adult, Care After ?The following information offers guidance on how to care for yourself after your procedure. Your health care provider may also give you more specific instructions. If you have problems or questions, contact your health care provider. ?What can I expect after the procedure? ?After the procedure, it is common to have: ?A small amount of blood in your stool for 24 hours after the procedure. ?Some gas. ?Mild cramping or bloating of your abdomen. ?Follow these  instructions at home: ?Eating and drinking ? ?Drink enough fluid to keep your urine pale yellow. ?Follow instructions from your health care provider about eating or drinking restrictions. ?Resume your normal diet as told by your health care provider. Avoid heavy or fried foods that are hard to digest. ?Activity ?Rest as told by your health care provider. ?Avoid sitting for a long time without moving. Get up to take short walks every 1-2 hours. This is important to improve blood flow and breathing. Ask for help if you feel weak or unsteady. ?Return to your normal activities as told by your health care provider. Ask your health care provider what activities are safe for you. ?Managing cramping and bloating ? ?Try walking around when you have cramps or feel bloated. ?If directed, apply heat to your abdomen as told by your health care provider. Use the heat source that your health care provider recommends, such as a moist heat pack or a heating pad. ?Place a towel between your skin and the heat source. ?Leave the heat on for 20-30 minutes. ?Remove the heat if your skin turns bright red. This is especially important if you are unable to feel pain, heat, or cold. You have a greater risk of getting burned. ?General instructions ?If you were given a sedative during the procedure, it can affect you for several hours. Do not drive or operate machinery until your health  care provider says that it is safe. ?For the first 24 hours after the procedure: ?Do not sign important documents. ?Do not drink alcohol. ?Do your regular daily activities at a slower pace than normal. ?Eat soft foods that are easy to digest. ?Take over-the-counter and prescription medicines only as told by your health care provider. ?Keep all follow-up visits. This is important. ?Contact a health care provider if: ?You have blood in your stool 2-3 days after the procedure. ?Get help right away if: ?You have more than a small spotting of blood in your  stool. ?You have large blood clots in your stool. ?You have swelling of your abdomen. ?You have nausea or vomiting. ?You have a fever. ?You have increasing pain in your abdomen that is not relieved with medicine. ?These symptoms may be an emergency. Get help right away. Call 911. ?Do not wait to see if the symptoms will go away. ?Do not drive yourself to the hospital. ?Summary ?After the procedure, it is common to have a small amount of blood in your stool. You may also have mild cramping and bloating of your abdomen. ?If you were given a sedative during the procedure, it can affect you for several hours. Do not drive or operate machinery until your health care provider says that it is safe. ?Get help right away if you have a lot of blood in your stool, nausea or vomiting, a fever, or increased pain in your abdomen. ?This information is not intended to replace advice given to you by your health care provider. Make sure you discuss any questions you have with your health care provider. ?Document Revised: 07/13/2021 Document Reviewed: 07/13/2021 ?Elsevier Patient Education ? [place]. ?Monitored Anesthesia Care, Care After ?This sheet gives you information about how to care for yourself after your procedure. Your health care provider may also give you more specific instructions. If you have problems or questions, contact your health care provider. ?What can I expect after the procedure? ?After the procedure, it is common to have: ?Tiredness. ?Forgetfulness about what happened after the procedure. ?Impaired judgment for important decisions. ?Nausea or vomiting. ?Some difficulty with balance. ?Follow these instructions at home: ?For the time period you were told by your health care provider: ? ?  ? ?Rest as needed. ?Do not participate in activities where you could fall or become injured. ?Do not drive or use machinery. ?Do not drink alcohol. ?Do not take sleeping pills or medicines that cause drowsiness. ?Do  not make important decisions or sign legal documents. ?Do not take care of children on your own. ?Eating and drinking ?Follow the diet that is recommended by your health care provider. ?Drink enough fluid to keep your urine pale yellow. ?If you vomit: ?Drink water, juice, or soup when you can drink without vomiting. ?Make sure you have little or no nausea before eating solid foods. ?General instructions ?Have a responsible adult stay with you for the time you are told. It is important to have someone help care for you until you are awake and alert. ?Take over-the-counter and prescription medicines only as told by your health care provider. ?If you have sleep apnea, surgery and certain medicines can increase your risk for breathing problems. Follow instructions from your health care provider about wearing your sleep device: ?Anytime you are sleeping, including during daytime naps. ?While taking prescription pain medicines, sleeping medicines, or medicines that make you drowsy. ?Avoid smoking. ?Keep all follow-up visits as told by your health care provider. This is important. ?  Contact a health care provider if: ?You keep feeling nauseous or you keep vomiting. ?You feel light-headed. ?You are still sleepy or having trouble with balance after 24 hours. ?You develop a rash. ?You have a fever. ?You have redness or swelling around the IV site. ?Get help right away if: ?You have trouble breathing. ?You have new-onset confusion at home. ?Summary ?For several hours after your procedure, you may feel tired. You may also be forgetful and have poor judgment. ?Have a responsible adult stay with you for the time you are told. It is important to have someone help care for you until you are awake and alert. ?Rest as told. Do not drive or operate machinery. Do not drink alcohol or take sleeping pills. ?Get help right away if you have trouble breathing, or if you suddenly become confused. ?This information is not intended to replace  advice given to you by your health care provider. Make sure you discuss any questions you have with your health care provider. ?Document Revised: 10/25/2021 Document Reviewed: 10/23/2019 ?Elsevier Patient [place]

## 2022-04-11 NOTE — Telephone Encounter (Signed)
Received VM from pt. Wanted to know time for pre-op. Called pt and made aware ?

## 2022-04-12 ENCOUNTER — Encounter (HOSPITAL_COMMUNITY)
Admission: RE | Admit: 2022-04-12 | Discharge: 2022-04-12 | Disposition: A | Discharge status: Home / Self Care | Payer: Medicare Other | Source: Ambulatory Visit | Attending: Internal Medicine | Admitting: Internal Medicine

## 2022-04-12 ENCOUNTER — Encounter (HOSPITAL_COMMUNITY): Payer: Self-pay

## 2022-04-12 VITALS — BP 156/82 | HR 59 | Temp 97.9°F | Resp 18 | Ht 69.0 in | Wt 209.7 lb

## 2022-04-12 DIAGNOSIS — N184 Chronic kidney disease, stage 4 (severe): Secondary | ICD-10-CM | POA: Insufficient documentation

## 2022-04-12 DIAGNOSIS — E1122 Type 2 diabetes mellitus with diabetic chronic kidney disease: Secondary | ICD-10-CM | POA: Insufficient documentation

## 2022-04-12 DIAGNOSIS — I10 Essential (primary) hypertension: Secondary | ICD-10-CM | POA: Diagnosis not present

## 2022-04-12 DIAGNOSIS — Z01818 Encounter for other preprocedural examination: Secondary | ICD-10-CM | POA: Diagnosis present

## 2022-04-12 DIAGNOSIS — E119 Type 2 diabetes mellitus without complications: Secondary | ICD-10-CM

## 2022-04-12 HISTORY — DX: Personal history of urinary calculi: Z87.442

## 2022-04-12 LAB — BASIC METABOLIC PANEL
Anion gap: 9 (ref 5–15)
BUN: 54 mg/dL — ABNORMAL HIGH (ref 8–23)
CO2: 20 mmol/L — ABNORMAL LOW (ref 22–32)
Calcium: 9.1 mg/dL (ref 8.9–10.3)
Chloride: 113 mmol/L — ABNORMAL HIGH (ref 98–111)
Creatinine, Ser: 5.79 mg/dL — ABNORMAL HIGH (ref 0.61–1.24)
GFR, Estimated: 10 mL/min — ABNORMAL LOW (ref >60)
Glucose, Bld: 110 mg/dL — ABNORMAL HIGH (ref 70–99)
Potassium: 4.7 mmol/L (ref 3.5–5.1)
Sodium: 142 mmol/L (ref 135–145)

## 2022-04-14 ENCOUNTER — Other Ambulatory Visit: Payer: Self-pay

## 2022-04-14 ENCOUNTER — Encounter (HOSPITAL_COMMUNITY)
Admission: RE | Disposition: A | Discharge status: Home / Self Care | Payer: Self-pay | Source: Home / Self Care | Attending: Internal Medicine

## 2022-04-14 ENCOUNTER — Ambulatory Visit (HOSPITAL_COMMUNITY): Payer: Medicare Other | Admitting: Anesthesiology

## 2022-04-14 ENCOUNTER — Encounter (HOSPITAL_COMMUNITY): Payer: Self-pay

## 2022-04-14 ENCOUNTER — Ambulatory Visit (HOSPITAL_BASED_OUTPATIENT_CLINIC_OR_DEPARTMENT_OTHER): Payer: Medicare Other | Admitting: Anesthesiology

## 2022-04-14 ENCOUNTER — Ambulatory Visit (HOSPITAL_COMMUNITY)
Admission: RE | Admit: 2022-04-14 | Discharge: 2022-04-14 | Disposition: A | Discharge status: Home / Self Care | Payer: Medicare Other | Attending: Internal Medicine | Admitting: Internal Medicine

## 2022-04-14 DIAGNOSIS — Z86010 Personal history of colonic polyps: Secondary | ICD-10-CM | POA: Diagnosis not present

## 2022-04-14 DIAGNOSIS — K648 Other hemorrhoids: Secondary | ICD-10-CM | POA: Diagnosis not present

## 2022-04-14 DIAGNOSIS — D123 Benign neoplasm of transverse colon: Secondary | ICD-10-CM | POA: Diagnosis not present

## 2022-04-14 DIAGNOSIS — K635 Polyp of colon: Secondary | ICD-10-CM | POA: Diagnosis not present

## 2022-04-14 DIAGNOSIS — K5730 Diverticulosis of large intestine without perforation or abscess without bleeding: Secondary | ICD-10-CM | POA: Diagnosis not present

## 2022-04-14 DIAGNOSIS — I10 Essential (primary) hypertension: Secondary | ICD-10-CM | POA: Diagnosis not present

## 2022-04-14 DIAGNOSIS — D122 Benign neoplasm of ascending colon: Secondary | ICD-10-CM | POA: Diagnosis not present

## 2022-04-14 DIAGNOSIS — E119 Type 2 diabetes mellitus without complications: Secondary | ICD-10-CM | POA: Diagnosis not present

## 2022-04-14 DIAGNOSIS — Z6830 Body mass index (BMI) 30.0-30.9, adult: Secondary | ICD-10-CM | POA: Diagnosis not present

## 2022-04-14 DIAGNOSIS — K219 Gastro-esophageal reflux disease without esophagitis: Secondary | ICD-10-CM | POA: Diagnosis not present

## 2022-04-14 DIAGNOSIS — Z1211 Encounter for screening for malignant neoplasm of colon: Secondary | ICD-10-CM | POA: Diagnosis present

## 2022-04-14 DIAGNOSIS — G4730 Sleep apnea, unspecified: Secondary | ICD-10-CM | POA: Diagnosis not present

## 2022-04-14 HISTORY — PX: POLYPECTOMY: SHX5525

## 2022-04-14 HISTORY — PX: COLONOSCOPY WITH PROPOFOL: SHX5780

## 2022-04-14 SURGERY — COLONOSCOPY WITH PROPOFOL
Anesthesia: General

## 2022-04-14 MED ORDER — PROPOFOL 500 MG/50ML IV EMUL
INTRAVENOUS | Status: DC | PRN
  Administered 2022-04-14: 200 ug/kg/min via INTRAVENOUS

## 2022-04-14 MED ORDER — PHENYLEPHRINE HCL (PRESSORS) 10 MG/ML IV SOLN
INTRAVENOUS | Status: DC | PRN
  Administered 2022-04-14: 160 ug via INTRAVENOUS

## 2022-04-14 MED ORDER — LACTATED RINGERS IV SOLN: INTRAVENOUS | Status: DC | PRN

## 2022-04-14 MED ORDER — LIDOCAINE HCL (CARDIAC) PF 100 MG/5ML IV SOSY
PREFILLED_SYRINGE | INTRAVENOUS | Status: DC | PRN
  Administered 2022-04-14: 60 mg via INTRATRACHEAL

## 2022-04-14 MED ORDER — PROPOFOL 10 MG/ML IV BOLUS
INTRAVENOUS | Status: DC | PRN
  Administered 2022-04-14: 100 mg via INTRAVENOUS
  Administered 2022-04-14: 50 mg via INTRAVENOUS
  Administered 2022-04-14: 100 mg via INTRAVENOUS

## 2022-04-14 NOTE — H&P (Signed)
Primary Care Physician:  Billie Ruddy, MD ?Primary Gastroenterologist:  Dr. Abbey Chatters ? ?Pre-Procedure History & Physical: ?HPI:  Bruce Weber. is a 71 y.o. male is here  for a colonoscopy to be performed for surveillance purposes. Last colonoscopy 03/01/2017 with multiple tubular adenomas removed ? ?Past Medical History:  ?Diagnosis Date  ? Adenoma 11/03/2009  ? 1.1 cm from TCS  ? DM (diabetes mellitus) ([place])   ? GERD (gastroesophageal reflux disease)   ? High cholesterol   ? History of kidney stones   ? HTN (hypertension)   ? Kidney stones   ? Obesity (BMI 30-39.9) DEC 2011 223 LBS  ? Rectal bleeding 12/04/2009  ? secondary to hemorrhoids  ? ? ?Past Surgical History:  ?Procedure Laterality Date  ? CIRCUMCISION    ? COLONOSCOPY  Dec 2010 [facility]  ? MOD IH, SIMPLE ADENOMA 1.1 CM  ? COLONOSCOPY  11/25/2012  ? SLF: 1. Sessile polyp measuring 55m in size was found at the hepatic flesure; polypectomy was performed using snare cautery 2. Moderate diverticulosis was noted in the ascending colon and sigmoid colon. 3. The colon mucosa was otherwise normal 4. Large internal hemorrhoids.   ? COLONOSCOPY N/A 03/01/2017  ? Procedure: COLONOSCOPY;  Surgeon: SDanie Binder MD;  Location: [facility];  Service: Endoscopy;  Laterality: N/A;  10:30 AM  ? EYE SURGERY  05/2018  ? bilateral cataract surgery  ? HERNIA REPAIR    ? ? ?Prior to Admission medications   ?Medication Sig Start Date End Date Taking? Authorizing Provider  ?acetaminophen (TYLENOL) 325 MG tablet Take 2 tablets (650 mg total) by mouth every 6 (six) hours as needed for moderate pain. 01/26/22  Yes MJaynee Eagles PA-C  ?allopurinol (ZYLOPRIM) 100 MG tablet Take 300 mg by mouth daily.    Yes [provider]  ?amLODipine (NORVASC) 10 MG tablet Take 10 mg by mouth daily.   Yes [provider]  ?calcitRIOL (ROCALTROL) 0.25 MCG capsule Take 0.25 mcg by mouth at bedtime.    Yes [provider]  ?clomiPHENE (CLOMID) 50 MG tablet Take 25 mg by  mouth at bedtime.    Yes [provider]  ?famciclovir (FAMVIR) 500 MG tablet Take 1,000 mg by mouth 2 (two) times daily as needed (fever blisters). 02/19/17  Yes [provider]  ?famotidine (PEPCID) 40 MG tablet TAKE 1 TABLET (40 MG TOTAL) BY MOUTH DAILY. WITH BREAKFAST 11/22/21  Yes BAnnitta Needs NP  ?fluticasone (FLONASE) 50 MCG/ACT nasal spray Place 1 spray into both nostrils daily for 14 days. ?Patient taking differently: Place 1 spray into both nostrils at bedtime as needed for allergies. 09/29/20 04/05/22 Yes Avegno, KDarrelyn Hillock FNP  ?furosemide (LASIX) 40 MG tablet Take 40 mg by mouth daily.   Yes [provider]  ?gabapentin (NEURONTIN) 100 MG capsule TAKE 1 CAPSULE BY MOUTH THREE TIMES A DAY 01/25/22  Yes HCarole Civil MD  ?hydrALAZINE (APRESOLINE) 25 MG tablet Take 25 mg by mouth 3 (three) times daily.   Yes [provider]  ?HYDROcodone-acetaminophen (NORCO/VICODIN) 5-325 MG tablet Take 1 tablet by mouth every 6 (six) hours as needed for severe pain. 01/26/22  Yes MJaynee Eagles PA-C  ?polyethylene glycol-electrolytes (TRILYTE) 420 g solution Take 4,000 mLs by mouth as directed. 03/08/22  Yes CEloise Harman DO  ?propranolol (INDERAL) 80 MG tablet TAKE 1 TABLET BY MOUTH TWICE A DAY ?Patient taking differently: Take 80 mg by mouth 3 (three) times daily. 10/24/21  Yes BGrier Mitts  R, MD  ?rosuvastatin (CRESTOR) 5 MG tablet Take 5 mg by mouth daily.   Yes [provider]  ?tamsulosin (FLOMAX) 0.4 MG CAPS capsule Take 0.4 mg by mouth daily. 05/25/20  Yes [provider]  ?atorvastatin (LIPITOR) 10 MG tablet TAKE 1 TABLET BY MOUTH EVERY DAY (NEED PHYSICAL) ?Patient not taking: Reported on 04/05/2022 12/07/21   Billie Ruddy, MD  ?cyclobenzaprine (FLEXERIL) 5 MG tablet TAKE 1 TABLET BY MOUTH AT BEDTIME AS NEEDED FOR MUSCLE SPASMS. ?Patient not taking: Reported on 12/26/2021 05/30/21   Billie Ruddy, MD  ?ondansetron (ZOFRAN-ODT) 4 MG disintegrating  tablet Take 1 tablet (4 mg total) by mouth every 8 (eight) hours as needed for nausea or vomiting. ?Patient not taking: Reported on 04/05/2022 03/02/22   Volney American, PA-C  ?traZODone (DESYREL) 50 MG tablet TAKE 1 TABLET BY MOUTH EVERYDAY AT BEDTIME ?Patient not taking: Reported on 12/26/2021 09/02/21   Baird Lyons D, MD  ?triamcinolone (KENALOG) 0.025 % ointment Apply 1 application topically 2 (two) times daily. ?Patient not taking: Reported on 04/05/2022 09/07/20   Billie Ruddy, MD  ?cetirizine (ZYRTEC ALLERGY) 10 MG tablet Take 1 tablet (10 mg total) by mouth daily. 09/29/20 09/29/20  Emerson Monte, FNP  ? ? ?Allergies as of 03/08/2022 - Review Complete 03/08/2022  ?Allergen Reaction Noted  ? Dye fdc blue [brilliant blue fcf (fd&c blue #1)]  06/12/2011  ? Iodinated contrast media Hives 06/12/2011  ? ? ?Family History  ?Problem Relation Age of Onset  ? Lung cancer Father   ? High blood pressure Father   ? Colon cancer Neg Hx   ? Colon polyps Neg Hx   ? ? ?Social History  ? ?Socioeconomic History  ? Marital status: Married  ?  Spouse name: Not on file  ? Number of children: 0  ? Years of education: Not on file  ? Highest education level: Not on file  ?Occupational History  ? Occupation: Chief Strategy Officer for [employer]  ?  Comment: retired  ? Occupation: Scientist, clinical (histocompatibility and immunogenetics)  ?  Comment: full time from home  ? 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  Comment: full time  ?Tobacco Use  ? Smoking status: Never  ?  Passive exposure: Never  ? Smokeless tobacco: Never  ? Tobacco comments:  ?  Never smoked  ?Vaping Use  ? Vaping Use: Never used  ?Substance and Sexual Activity  ? Alcohol use: No  ? Drug use: No  ? Sexual activity: Never  ?  Birth control/protection: None  ?Other Topics Concern  ? Not on file  ?Social History Narrative  ? NO KIDS. MARRIED TO 2ND WIFE.  ?   ? 02/06/2019: Lives with mother at this time assisting in her care, while wife lives nearby, taking care of her own parents.  ? Works from home full time, but  planning on retiring in next month to focus on health/well-being  ? Used to work out on treadmill, but has not in recent months  ? Motivated to return to exercise regimine and better diet control  ? Enjoys watching basketball, football  ?   ? ?Social Determinants of Health  ? ?Financial Resource Strain: Not on file  ?Food Insecurity: Not on file  ?Transportation Needs: No Transportation Needs  ? Lack of Transportation (Medical): No  ? Lack of Transportation (Non-Medical): No  ?Physical Activity: Inactive  ? Days of Exercise per Week: 0 days  ? Minutes of Exercise per Session: 0 min  ?Stress: Not on  file  ?Social Connections: Socially Integrated  ? Frequency of Communication with Friends and Family: Three times a week  ? Frequency of Social Gatherings with Friends and Family: Three times a week  ? Attends Religious Services: More than 4 times per year  ? Active Member of Clubs or Organizations: Yes  ? Attends Archivist Meetings: More than 4 times per year  ? Marital Status: Married  ?Intimate Partner Violence: Not At Risk  ? Fear of Current or Ex-Partner: No  ? Emotionally Abused: No  ? Physically Abused: No  ? Sexually Abused: No  ? ? ?Review of Systems: ?See HPI, otherwise negative ROS ? ?Physical Exam: ?Vital signs in last 24 hours: ?Temp:  [98 ?F (36.7 ?C)] 98 ?F (36.7 ?C) (05/12 0911) ?Pulse Rate:  [57] 57 (05/12 0911) ?Resp:  [16] 16 (05/12 0911) ?BP: (159)/(72) 159/72 (05/12 0911) ?SpO2:  [97 %] 97 % (05/12 0911) ?  ?General:   Alert,  Well-developed, well-nourished, pleasant and cooperative in NAD ?Head:  Normocephalic and atraumatic. ?Eyes:  Sclera clear, no icterus.   Conjunctiva pink. ?Ears:  Normal auditory acuity. ?Nose:  No deformity, discharge,  or lesions. ?Mouth:  No deformity or lesions, dentition normal. ?Neck:  Supple; no masses or thyromegaly. ?Lungs:  Clear throughout to auscultation.   No wheezes, crackles, or rhonchi. No acute distress. ?Heart:  Regular rate and rhythm; no murmurs,  clicks, rubs,  or gallops. ?Abdomen:  Soft, nontender and nondistended. No masses, hepatosplenomegaly or hernias noted. Normal bowel sounds, without guarding, and without rebound.   ?Msk:  Symmetrical without gro

## 2022-04-14 NOTE — Anesthesia Postprocedure Evaluation (Signed)
Anesthesia Post Note ? ?Patient: Hillery Bhalla. ? ?Procedure(s) Performed: COLONOSCOPY WITH PROPOFOL ?POLYPECTOMY ? ?Patient location during evaluation: Phase II ?Anesthesia Type: General ?Level of consciousness: awake ?Pain management: pain level controlled ?Vital Signs Assessment: post-procedure vital signs reviewed and stable ?Respiratory status: spontaneous breathing and respiratory function stable ?Cardiovascular status: blood pressure returned to baseline and stable ?Postop Assessment: no headache and no apparent nausea or vomiting ?Anesthetic complications: no ?Comments: Late entry ? ? ?No notable events documented. ? ? ?Last Vitals:  ?Vitals:  ? 04/14/22 0911 04/14/22 1032  ?BP: (!) 159/72 (!) 104/42  ?Pulse: (!) 57 (!) 54  ?Resp: 16 13  ?Temp: 36.7 ?C 36.5 ?C  ?SpO2: 97% 96%  ?  ?Last Pain:  ?Vitals:  ? 04/14/22 1032  ?TempSrc: Oral  ?PainSc: 0-No pain  ? ? ?  ?  ?  ?  ?  ?  ? ?Louann Sjogren ? ? ? ? ?

## 2022-04-14 NOTE — Anesthesia Preprocedure Evaluation (Signed)
Anesthesia Evaluation  ?Patient identified by MRN, date of birth, ID band ?Patient awake ? ? ? ?Reviewed: ?Allergy & Precautions, H&P , NPO status , Patient's Chart, lab work & pertinent test results, reviewed documented beta blocker date and time  ? ?Airway ?Mallampati: II ? ?TM Distance: >3 FB ?Neck ROM: full ? ? ? Dental ?no notable dental hx. ? ?  ?Pulmonary ?sleep apnea ,  ?  ?Pulmonary exam normal ?breath sounds clear to auscultation ? ? ? ? ? ? Cardiovascular ?Exercise Tolerance: Good ?hypertension, negative cardio ROS ? ? ?Rhythm:regular Rate:Normal ? ? ?  ?Neuro/Psych ?PSYCHIATRIC DISORDERS negative neurological ROS ?   ? GI/Hepatic ?Neg liver ROS, GERD  Medicated,  ?Endo/Other  ?diabetes, Type 2Morbid obesity ? Renal/GU ?CRFRenal disease  ?negative genitourinary ?  ?Musculoskeletal ? ? Abdominal ?  ?Peds ? Hematology ?negative hematology ROS ?(+)   ?Anesthesia Other Findings ? ? Reproductive/Obstetrics ?negative OB ROS ? ?  ? ? ? ? ? ? ? ? ? ? ? ? ? ?  ?  ? ? ? ? ? ? ? ? ?Anesthesia Physical ?Anesthesia Plan ? ?ASA: 3 ? ?Anesthesia Plan: General  ? ?Post-op Pain Management:   ? ?Induction:  ? ?PONV Risk Score and Plan: Propofol infusion ? ?Airway Management Planned:  ? ?Additional Equipment:  ? ?Intra-op Plan:  ? ?Post-operative Plan:  ? ?Informed Consent: I have reviewed the patients History and Physical, chart, labs and discussed the procedure including the risks, benefits and alternatives for the proposed anesthesia with the patient or authorized representative who has indicated his/her understanding and acceptance.  ? ? ? ?Dental Advisory Given ? ?Plan Discussed with: CRNA ? ?Anesthesia Plan Comments:   ? ? ? ? ? ? ?Anesthesia Quick Evaluation ? ?

## 2022-04-14 NOTE — Discharge Instructions (Addendum)
?  Colonoscopy ?Discharge Instructions ? ?Read the instructions outlined below and refer to this sheet in the next few weeks. These discharge instructions provide you with general information on caring for yourself after you leave the hospital. Your doctor may also give you specific instructions. While your treatment has been planned according to the most current medical practices available, unavoidable complications occasionally occur.  ? ?ACTIVITY ?You may resume your regular activity, but move at a slower pace for the next 24 hours.  ?Take frequent rest periods for the next 24 hours.  ?Walking will help get rid of the air and reduce the bloated feeling in your belly (abdomen).  ?No driving for 24 hours (because of the medicine (anesthesia) used during the test).   ?Do not sign any important legal documents or operate any machinery for 24 hours (because of the anesthesia used during the test).  ?NUTRITION ?Drink plenty of fluids.  ?You may resume your normal diet as instructed by your doctor.  ?Begin with a light meal and progress to your normal diet. Heavy or fried foods are harder to digest and may make you feel sick to your stomach (nauseated).  ?Avoid alcoholic beverages for 24 hours or as instructed.  ?MEDICATIONS ?You may resume your normal medications unless your doctor tells you otherwise.  ?WHAT YOU CAN EXPECT TODAY ?Some feelings of bloating in the abdomen.  ?Passage of more gas than usual.  ?Spotting of blood in your stool or on the toilet paper.  ?IF YOU HAD POLYPS REMOVED DURING THE COLONOSCOPY: ?No aspirin products for 7 days or as instructed.  ?No alcohol for 7 days or as instructed.  ?Eat a soft diet for the next 24 hours.  ?FINDING OUT THE RESULTS OF YOUR TEST ?Not all test results are available during your visit. If your test results are not back during the visit, make an appointment with your caregiver to find out the results. Do not assume everything is normal if you have not heard from your  caregiver or the medical facility. It is important for you to follow up on all of your test results.  ?SEEK IMMEDIATE MEDICAL ATTENTION IF: ?You have more than a spotting of blood in your stool.  ?Your belly is swollen (abdominal distention).  ?You are nauseated or vomiting.  ?You have a temperature over 101.  ?You have abdominal pain or discomfort that is severe or gets worse throughout the day.  ? ?Your colonoscopy revealed 4 polyp(s) which I removed successfully. Await pathology results, my office will contact you. I recommend repeating colonoscopy in 5 years for surveillance purposes. Otherwise follow up as needed.  ? ? ?I hope you have a great rest of your week! ? ?Elon Alas. Abbey Chatters, D.O. ?Gastroenterology and Hepatology ?[facility]\ ? ?

## 2022-04-14 NOTE — Op Note (Signed)
[facility] ?Patient Name: Bruce Weber ?Procedure Date: 04/14/2022 10:00 AM ?MRN: [id number] ?Date of Birth: 1951-05-04 ?Attending MD: Elon Alas. Abbey Chatters , DO ?CSN: [id number] ?Age: 71 ?Admit Type: Outpatient ?Procedure:                Colonoscopy ?Indications:              Surveillance: Personal history of adenomatous  ?                          polyps on last colonoscopy 5 years ago ?Providers:                Elon Alas. Abbey Chatters, DO, Caprice Kluver, Raphael Gibney,  ?                          Technician ?Referring MD:              ?Medicines:                See the Anesthesia note for documentation of the  ?                          administered medications ?Complications:            No immediate complications. ?Estimated Blood Loss:     Estimated blood loss was minimal. ?Procedure:                Pre-Anesthesia Assessment: ?                          - The anesthesia plan was to use monitored  ?                          anesthesia care (MAC). ?                          After obtaining informed consent, the colonoscope  ?                          was passed under direct vision. Throughout the  ?                          procedure, the patient's blood pressure, pulse, and  ?                          oxygen saturations were monitored continuously. The  ?                          PCF-HQ190L ([id number]) scope was introduced through  ?                          the anus and advanced to the the cecum, identified  ?                          by appendiceal orifice and ileocecal valve. The  ?                          colonoscopy was performed without difficulty. The  ?  patient tolerated the procedure well. The quality  ?                          of the bowel preparation was evaluated using the  ?                          BBPS [facility] Bowel Preparation Scale) with scores  ?                          of: Right Colon = 3, Transverse Colon = 3 and Left  ?                          Colon = 3 (entire mucosa seen  well with no residual  ?                          staining, small fragments of stool or opaque  ?                          liquid). The total BBPS score equals 9. ?Scope In: 10:17:23 AM ?Scope Out: 10:29:01 AM ?Scope Withdrawal Time: 0 hours 9 minutes 22 seconds  ?Total Procedure Duration: 0 hours 11 minutes 38 seconds  ?Findings: ?     The perianal and digital rectal examinations were normal. ?     Non-bleeding internal hemorrhoids were found during endoscopy. ?     Multiple small and large-mouthed diverticula were found in the sigmoid  ?     colon, descending colon and transverse colon. ?     A 2 mm polyp was found in the ascending colon. The polyp was sessile.  ?     The polyp was removed with a cold biopsy forceps. Resection and  ?     retrieval were complete. ?     A 5 mm polyp was found in the ascending colon. The polyp was sessile.  ?     The polyp was removed with a cold snare. Resection and retrieval were  ?     complete. ?     Two sessile polyps were found in the transverse colon. The polyps were 5  ?     to 6 mm in size. These polyps were removed with a cold snare. Resection  ?     and retrieval were complete. ?Impression:               - Non-bleeding internal hemorrhoids. ?                          - Diverticulosis in the sigmoid colon and in the  ?                          descending colon. ?                          - One 2 mm polyp in the ascending colon, removed  ?                          with a cold biopsy forceps. Resected and retrieved. ?                          -  One 7 mm polyp in the ascending colon, removed  ?                          with a cold snare. Resected and retrieved. ?                          - Two 5 to 6 mm polyps in the transverse colon,  ?                          removed with a cold snare. Resected and retrieved. ?Moderate Sedation: ?     Per Anesthesia Care ?Recommendation:           - Patient has a contact number available for  ?                          emergencies. The  signs and symptoms of potential  ?                          delayed complications were discussed with the  ?                          patient. Return to normal activities tomorrow.  ?                          Written discharge instructions were provided to the  ?                          patient. ?                          - Resume previous diet. ?                          - Continue present medications. ?                          - Await pathology results. ?                          - Repeat colonoscopy in 5 years for surveillance. ?                          - Return to GI clinic PRN. ?Procedure Code(s):        --- Professional --- ?                          [phone], Colonoscopy, flexible; with removal of  ?                          tumor(s), polyp(s), or other lesion(s) by snare  ?                          technique ?                          [id number], 59, Colonoscopy, flexible; with  biopsy,  ?                          single or multiple ?Diagnosis Code(s):        --- Professional --- ?                          K63.5, Polyp of colon ?                          Z86.010, Personal history of colonic polyps ?                          K64.8, Other hemorrhoids ?                          K57.30, Diverticulosis of large intestine without  ?                          perforation or abscess without bleeding ?CPT copyright 2019 American Medical Association. All rights reserved. ?The codes documented in this report are preliminary and upon coder review may  ?be revised to meet current compliance requirements. ?Elon Alas. Abbey Chatters, DO ?[facility], DO ?04/14/2022 10:32:08 AM ?This report has been signed electronically. ?Number of Addenda: 0 ?

## 2022-04-14 NOTE — Transfer of Care (Signed)
Immediate Anesthesia Transfer of Care Note ? ?Patient: Bruce Weber. ? ?Procedure(s) Performed: COLONOSCOPY WITH PROPOFOL ?POLYPECTOMY ? ?Patient Location: Short Stay ? ?Anesthesia Type:General ? ?Level of Consciousness: sedated ? ?Airway & Oxygen Therapy: Patient Spontanous Breathing ? ?Post-op Assessment: Report given to RN and Post -op Vital signs reviewed and stable ? ?Post vital signs: Reviewed and stable ? ?Last Vitals:  ?Vitals Value Taken Time  ?BP    ?Temp    ?Pulse    ?Resp    ?SpO2    ? ? ?Last Pain:  ?Vitals:  ? 04/14/22 1005  ?PainSc: 0-No pain  ?   ? ?  ? ?Complications: No notable events documented. ?

## 2022-04-17 LAB — SURGICAL PATHOLOGY

## 2022-04-19 ENCOUNTER — Other Ambulatory Visit: Payer: Self-pay | Admitting: Orthopedic Surgery

## 2022-04-19 DIAGNOSIS — M545 Low back pain, unspecified: Secondary | ICD-10-CM

## 2022-04-21 ENCOUNTER — Encounter (HOSPITAL_COMMUNITY): Payer: Self-pay | Admitting: Internal Medicine

## 2022-05-05 ENCOUNTER — Ambulatory Visit (INDEPENDENT_AMBULATORY_CARE_PROVIDER_SITE_OTHER): Payer: Medicare Other

## 2022-05-05 ENCOUNTER — Ambulatory Visit: Payer: Medicare Other | Admitting: Podiatry

## 2022-05-05 DIAGNOSIS — S92354D Nondisplaced fracture of fifth metatarsal bone, right foot, subsequent encounter for fracture with routine healing: Secondary | ICD-10-CM

## 2022-05-07 NOTE — Progress Notes (Signed)
Subjective: 71 year old male presents the office today for follow evaluation of right fifth metatarsal base fracture.  States he has been doing better.  He does say he walks with the boot at home and is not having significant pain.  He gets some occasional swelling but he keeps compression on their help with this but overall this is improving as well.  No recent injury or change otherwise.  No new concerns.   Objective: AAO x3, NAD DP/PT pulses palpable bilaterally, CRT less than 3 seconds No significant area of discomfort noted.  In particular not able to elicit any area of tenderness particular there is no pain on the fifth metatarsal base on the right foot.  There is trace edema.  No significant normal course of the peroneal tendon.  There is no other areas of discomfort noted today.  MMT 5/5. No pain with calf compression, swelling, warmth, erythema  Assessment: Fifth metatarsal fracture  Plan: -All treatment options discussed with the patient including all alternatives, risks, complications.  -Repeat x-rays were obtained and reviewed.  3 views of the right foot were obtained.  The lucency still noted along the fifth metatarsal base consistent with fracture however some increased consolidation is present.  No new fracture present.  -At this point discussed and gradual transition to regular shoe as tolerated however there is any increasing pain or swelling to return to the cam boot.  Continue ice, elevate.  Return in about 6 weeks (around 06/16/2022).  Trula Slade DPM

## 2022-05-18 LAB — IRON,TIBC AND FERRITIN PANEL
Iron: 63
UIBC: 233

## 2022-05-18 LAB — BASIC METABOLIC PANEL
BUN: 47 — AB (ref 4–21)
CO2: 23 — AB (ref 13–22)
Creatinine: 5 — AB (ref 0.6–1.3)
Glucose: 113
Potassium: 4.4 mEq/L (ref 3.5–5.1)
Sodium: 135 — AB (ref 137–147)

## 2022-05-18 LAB — VITAMIN D 25 HYDROXY (VIT D DEFICIENCY, FRACTURES): Vit D, 25-Hydroxy: 35.9

## 2022-05-18 LAB — COMPREHENSIVE METABOLIC PANEL
Albumin: 4.3 (ref 3.5–5.0)
Calcium: 9.2 (ref 8.7–10.7)
eGFR: 12

## 2022-05-18 LAB — CBC: RBC: 3.48 — AB (ref 3.87–5.11)

## 2022-05-18 LAB — CBC AND DIFFERENTIAL
Hemoglobin: 11.1 — AB (ref 13.5–17.5)
WBC: 10.1

## 2022-05-25 ENCOUNTER — Other Ambulatory Visit: Payer: Self-pay | Admitting: Internal Medicine

## 2022-05-25 NOTE — Telephone Encounter (Signed)
Trazodone refiled

## 2022-06-13 ENCOUNTER — Encounter: Payer: Self-pay | Admitting: Family Medicine

## 2022-06-14 ENCOUNTER — Encounter: Payer: Self-pay | Admitting: Family Medicine

## 2022-06-15 ENCOUNTER — Ambulatory Visit: Payer: Medicare Other | Admitting: Podiatry

## 2022-07-17 ENCOUNTER — Other Ambulatory Visit: Payer: Self-pay | Admitting: Family Medicine

## 2022-07-18 ENCOUNTER — Other Ambulatory Visit: Payer: Self-pay | Admitting: Family Medicine

## 2022-07-18 DIAGNOSIS — I10 Essential (primary) hypertension: Secondary | ICD-10-CM

## 2022-07-20 ENCOUNTER — Ambulatory Visit: Payer: Medicare Other | Admitting: Podiatry

## 2022-07-25 ENCOUNTER — Ambulatory Visit (INDEPENDENT_AMBULATORY_CARE_PROVIDER_SITE_OTHER): Payer: Medicare Other

## 2022-07-25 ENCOUNTER — Ambulatory Visit: Payer: Medicare Other | Admitting: Podiatry

## 2022-07-25 DIAGNOSIS — M79672 Pain in left foot: Secondary | ICD-10-CM

## 2022-07-25 DIAGNOSIS — M779 Enthesopathy, unspecified: Secondary | ICD-10-CM

## 2022-07-25 DIAGNOSIS — S92354D Nondisplaced fracture of fifth metatarsal bone, right foot, subsequent encounter for fracture with routine healing: Secondary | ICD-10-CM

## 2022-07-25 NOTE — Patient Instructions (Signed)
Make sure that you moisturize the feet daily, not between the toes.   You can also use topicals like biofreeze on the foot.

## 2022-07-27 ENCOUNTER — Ambulatory Visit (INDEPENDENT_AMBULATORY_CARE_PROVIDER_SITE_OTHER): Payer: Medicare Other | Admitting: Family Medicine

## 2022-07-27 VITALS — BP 142/78 | HR 52 | Temp 98.3°F | Wt 211.8 lb

## 2022-07-27 DIAGNOSIS — N184 Chronic kidney disease, stage 4 (severe): Secondary | ICD-10-CM

## 2022-07-27 DIAGNOSIS — R2689 Other abnormalities of gait and mobility: Secondary | ICD-10-CM

## 2022-07-27 DIAGNOSIS — E119 Type 2 diabetes mellitus without complications: Secondary | ICD-10-CM

## 2022-07-27 DIAGNOSIS — I10 Essential (primary) hypertension: Secondary | ICD-10-CM | POA: Diagnosis not present

## 2022-07-27 LAB — POCT URINALYSIS DIPSTICK
Bilirubin, UA: NEGATIVE
Blood, UA: NEGATIVE
Glucose, UA: NEGATIVE
Ketones, UA: NEGATIVE
Leukocytes, UA: NEGATIVE
Nitrite, UA: NEGATIVE
Protein, UA: POSITIVE — AB
Spec Grav, UA: 1.015 (ref 1.010–1.025)
Urobilinogen, UA: NEGATIVE E.U./dL — AB
pH, UA: 6 (ref 5.0–8.0)

## 2022-07-27 LAB — GLUCOSE, POCT (MANUAL RESULT ENTRY): POC Glucose: 105 mg/dl — AB (ref 70–99)

## 2022-07-27 NOTE — Progress Notes (Signed)
Subjective:    Patient ID: Bruce Weber., male    DOB: 08-01-1951, 71 y.o.   MRN: [id number]  Chief Complaint  Patient presents with   Hyperglycemia    Was having issues, had been drinking cranberry juice for blood in urine. Was not able to get in with uro. But starting becoming more active and as days have went but, numbers have leveled out.    Hypertension    Was 157/90 the other day, 144/92.     HPI Patient was seen today for follow-up.  Patient states he was having hyperglycemia with blood sugar 250, 270.  States noted after drinking cranberry juice for several days as he was concerned he may have had blood in his urine.  Patient states urine was a dark brown color.  Patient notified nephrology and urology about his concern.  Pt increased intake of water and noticed improvement in urine color.  Patient denies dysuria.  Endorses difficulty with stream and occasional urine leakage.  Pt is awaiting a kidney donor.  Patient notes BP elevated [id number] systolic.  Patient has not been walking on treadmill.  Pt notes intermittent off balance feeling x 6 months.  May wake up feeling dizzy.  At times feels like he is "walking like a drunk".  Pt notes breaking R foot after a fall in Feb 2023 while at a basketball game.  Pt followed by podiatry.  Was in a boot for closed nondisplaced fracture of fifth metatarsal.  Patient notes intermittent sharp pain like a pinprick in right eye.  Has follow-up with ophthalmology, Dr. Gershon Crane in November.  Colonoscopy in May had a few polyps.  Past Medical History:  Diagnosis Date   Adenoma 11/03/2009   1.1 cm from TCS   DM (diabetes mellitus) (HCC)    GERD (gastroesophageal reflux disease)    High cholesterol    History of kidney stones    HTN (hypertension)    Kidney stones    Obesity (BMI 30-39.9) DEC 2011 223 LBS   Rectal bleeding 12/04/2009   secondary to hemorrhoids    Allergies  Allergen Reactions   Dye Fdc Blue [Brilliant Blue Fcf (Fd&C  Blue #1)]     PT UNSURE WHICH DYE   Iodinated Contrast Media Hives    30 YEARS AGO, recent 2021 epidural with contrast ok    ROS General: Denies fever, chills, night sweats, changes in weight, changes in appetite + off balance HEENT: Denies headaches, ear pain, changes in vision, rhinorrhea, sore throat  +intermittent discomfort in R eye CV: Denies CP, palpitations, SOB, orthopnea Pulm: Denies SOB, cough, wheezing GI: Denies abdominal pain, nausea, vomiting, diarrhea, constipation GU: Denies dysuria, hematuria, frequency Msk: Denies muscle cramps, joint pains Neuro: Denies weakness, numbness, tingling Skin: Denies rashes, bruising Psych: Denies depression, anxiety, hallucinations     Objective:    Blood pressure (!) 142/78, pulse (!) 52, temperature 98.3 F (36.8 C), temperature source Oral, weight 211 lb 12.8 oz (96.1 kg), SpO2 99 %.  Gen. Pleasant, well-nourished, in no distress, normal affect   HEENT: [place]/AT, face symmetric, conjunctiva clear, no scleral icterus, PERRLA, EOMI, no nystagmus, nares patent without drainage, pharynx without erythema or exudate.  TMs normal bilaterally. Neck: No JVD, no thyromegaly, no carotid bruits Lungs: no accessory muscle use, CTAB, no wheezes or rales Cardiovascular: RRR, no m/r/g, no peripheral edema Musculoskeletal: No deformities, no cyanosis or clubbing, normal tone Neuro:  A&Ox3, CN II-XII intact, normal gait Skin:  Warm, no lesions/ rash  Wt Readings from Last 3 Encounters:  07/27/22 211 lb 12.8 oz (96.1 kg)  04/12/22 209 lb 10.5 oz (95.1 kg)  03/08/22 209 lb 9.6 oz (95.1 kg)    Lab Results  Component Value Date   WBC 10.1 05/18/2022   HGB 11.1 (A) 05/18/2022   HCT 35 (A) 10/05/2021   PLT 196 10/05/2021   GLUCOSE 110 (H) 04/12/2022   CHOL 166 08/24/2021   TRIG 65.0 08/24/2021   HDL 56.20 08/24/2021   LDLCALC 97 08/24/2021   ALT 12 10/05/2021   AST 13 (A) 10/05/2021   NA 135 (A) 05/18/2022   K 4.4 05/18/2022   CL 113  (H) 04/12/2022   CREATININE 5.0 (A) 05/18/2022   BUN 47 (A) 05/18/2022   CO2 23 (A) 05/18/2022   TSH 2.03 08/24/2021   PSA 1.10 04/15/2021   HGBA1C 5.6 08/24/2021    Assessment/Plan:  Balance problem -discussed possible cuases including dehydration, hypoglycemia, hypotenstion, vertigo, UTI, etc -encouraged to increase hydration, eat regular meals, etc -for continued symptoms obtain imaging of head  - Plan: POCT urinalysis dipstick  Essential hypertension -elevated -continue current meds -lifestyle modifications  Diet-controlled diabetes mellitus (HCC) -stable  CKD (chronic kidney disease) stage 4, GFR 15-29 ml/min (HCC) -stable -awaiting donor -continue f/u with Nephrology  F/u prn in 3 months, sooner if needed.  Grier Mitts, MD

## 2022-08-01 NOTE — Progress Notes (Signed)
Subjective: 71 year old male presents the office today for follow evaluation of right fifth metatarsal base fracture.  He states that he is doing better.,  Regular shoe.  He has no concerns of pain on his left foot plantar lateral aspect.  No injuries on the side that he reports.  No recent treatment for this.  No other concerns.   Objective: AAO x3, NAD DP/PT pulses palpable bilaterally, CRT less than 3 seconds On the right foot on the area the fracture of the fifth metatarsal base there is no significant pain there is no edema or erythema.  On the left foot there is tenderness with metatarsal base on the distal portion of the peroneal tendon on the insertion.  Clinically the tendon appears to be intact.  No area pinpoint tenderness otherwise. MMT 5/5. No pain with calf compression, swelling, warmth, erythema  Assessment: Fifth metatarsal fracture; fifth metatarsal base pain left foot  Plan: -All treatment options discussed with the patient including all alternatives, risks, complications.  -Repeat x-rays were obtained and reviewed.  3 views of the right foot were obtained.  The lucency still noted along the fifth metatarsal base but there is increased consolidation.  On the left foot no subacute fracture.  Well corticated ossicle along the fifth met cuboid. -On the right foot continue supportive shoe gear. -On the left foot we discussed wearing shoes with arch support.  Discussed wearing better support will hopefully be helpful.  Discussed topical medications that he can use that will be safe for his kidneys but also to help with the pain.  Icing daily as well.  Return in about 6 weeks (around 09/05/2022), or if symptoms worsen or fail to improve.  Trula Slade DPM

## 2022-08-04 ENCOUNTER — Ambulatory Visit: Payer: Medicare Other

## 2022-08-09 ENCOUNTER — Ambulatory Visit (INDEPENDENT_AMBULATORY_CARE_PROVIDER_SITE_OTHER): Payer: Medicare Other

## 2022-08-09 VITALS — BP 134/64 | HR 60 | Temp 98.3°F | Ht 69.0 in | Wt 217.7 lb

## 2022-08-09 DIAGNOSIS — Z0000 Encounter for general adult medical examination without abnormal findings: Secondary | ICD-10-CM | POA: Diagnosis not present

## 2022-08-09 NOTE — Progress Notes (Signed)
Subjective:   Bruce Weber. is a 71 y.o. male who presents for Medicare Annual/Subsequent preventive examination.  Review of Systems     Cardiac Risk Factors include: advanced age (>70mn, >>25women);hypertension;male gender     Objective:    Today's Vitals   08/09/22 1429  BP: 134/64  Pulse: 60  Temp: 98.3 F (36.8 C)  TempSrc: Oral  SpO2: 96%  Weight: 217 lb 11.2 oz (98.7 kg)  Height: '5\' 9"'$  (1.753 m)   Body mass index is 32.15 kg/m.     08/09/2022    2:48 PM 04/12/2022   10:17 AM 07/29/2021    3:05 PM 08/13/2020    1:42 PM 03/23/2020    3:50 PM 01/27/2020   11:26 AM 02/06/2019    9:10 AM  Advanced Directives  Does Patient Have a Medical Advance Directive? No No Yes Yes No Yes No  Type of AScientist, physiologicalof [place]Living will H[place]Living will  Living will   Does patient want to make changes to medical advance directive?    No - Patient declined  Yes (ED - Information included in AVS)   Copy of [place]in Chart?   No - copy requested No - copy requested     Would patient like information on creating a medical advance directive? No - Patient declined No - Patient declined     No - Patient declined    Current Medications (verified) Outpatient Encounter Medications as of 08/09/2022  Medication Sig   acetaminophen (TYLENOL) 325 MG tablet Take 2 tablets (650 mg total) by mouth every 6 (six) hours as needed for moderate pain.   allopurinol (ZYLOPRIM) 100 MG tablet Take 300 mg by mouth daily.    amLODipine (NORVASC) 10 MG tablet TAKE 1 TABLET BY MOUTH EVERYDAY AT BEDTIME   atorvastatin (LIPITOR) 10 MG tablet TAKE 1 TABLET BY MOUTH EVERY DAY (NEED PHYSICAL)   calcitRIOL (ROCALTROL) 0.25 MCG capsule Take 0.25 mcg by mouth at bedtime.    clomiPHENE (CLOMID) 50 MG tablet Take 25 mg by mouth at bedtime.    cyclobenzaprine (FLEXERIL) 5 MG tablet TAKE 1 TABLET BY MOUTH AT BEDTIME AS NEEDED FOR MUSCLE SPASMS. (Patient  not taking: Reported on 12/26/2021)   famciclovir (FAMVIR) 500 MG tablet Take 1,000 mg by mouth 2 (two) times daily as needed (fever blisters).   famotidine (PEPCID) 40 MG tablet TAKE 1 TABLET (40 MG TOTAL) BY MOUTH DAILY. WITH BREAKFAST   fluticasone (FLONASE) 50 MCG/ACT nasal spray Place 1 spray into both nostrils daily for 14 days. (Patient taking differently: Place 1 spray into both nostrils at bedtime as needed for allergies.)   furosemide (LASIX) 40 MG tablet Take 40 mg by mouth daily.   gabapentin (NEURONTIN) 100 MG capsule TAKE 1 CAPSULE BY MOUTH THREE TIMES A DAY   hydrALAZINE (APRESOLINE) 25 MG tablet Take 25 mg by mouth 3 (three) times daily.   HYDROcodone-acetaminophen (NORCO/VICODIN) 5-325 MG tablet Take 1 tablet by mouth every 6 (six) hours as needed for severe pain.   propranolol (INDERAL) 80 MG tablet TAKE 1 TABLET BY MOUTH TWICE A DAY   rosuvastatin (CRESTOR) 5 MG tablet Take 5 mg by mouth daily.   tamsulosin (FLOMAX) 0.4 MG CAPS capsule Take 0.4 mg by mouth daily.   traZODone (DESYREL) 50 MG tablet TAKE 1 TABLET BY MOUTH EVERYDAY AT BEDTIME   triamcinolone (KENALOG) 0.025 % ointment Apply 1 application topically 2 (two) times daily. (  Patient not taking: Reported on 04/05/2022)   [DISCONTINUED] cetirizine (ZYRTEC ALLERGY) 10 MG tablet Take 1 tablet (10 mg total) by mouth daily.   No facility-administered encounter medications on file as of 08/09/2022.    Allergies (verified) Dye fdc blue [brilliant blue fcf (fd&c blue #1)] and Iodinated contrast media   History: Past Medical History:  Diagnosis Date   Adenoma 11/03/2009   1.1 cm from TCS   DM (diabetes mellitus) (HCC)    GERD (gastroesophageal reflux disease)    High cholesterol    History of kidney stones    HTN (hypertension)    Kidney stones    Obesity (BMI 30-39.9) DEC 2011 223 LBS   Rectal bleeding 12/04/2009   secondary to hemorrhoids   Past Surgical History:  Procedure Laterality Date   CIRCUMCISION      COLONOSCOPY  Dec 2010 [facility]   MOD IH, SIMPLE ADENOMA 1.1 CM   COLONOSCOPY  11/25/2012   SLF: 1. Sessile polyp measuring 18m in size was found at the hepatic flesure; polypectomy was performed using snare cautery 2. Moderate diverticulosis was noted in the ascending colon and sigmoid colon. 3. The colon mucosa was otherwise normal 4. Large internal hemorrhoids.    COLONOSCOPY N/A 03/01/2017   Procedure: COLONOSCOPY;  Surgeon: SDanie Binder MD;  [facility]: [facility];  Service: Endoscopy;  Laterality: N/A;  10:30 AM   COLONOSCOPY WITH PROPOFOL N/A 04/14/2022   Procedure: COLONOSCOPY WITH PROPOFOL;  Surgeon: CEloise Harman DO;  Location: [facility];  Service: Endoscopy;  Laterality: N/A;  11:00am   EYE SURGERY  05/2018   bilateral cataract surgery   HERNIA REPAIR     POLYPECTOMY  04/14/2022   Procedure: POLYPECTOMY;  Surgeon: CEloise Harman DO;  Location: [facility];  Service: Endoscopy;;   Family History  Problem Relation Age of Onset   Lung cancer Father    High blood pressure Father    Colon cancer Neg Hx    Colon polyps Neg Hx    Social History   Socioeconomic History   Marital status: Married    Spouse name: Not on file   Number of children: 0   Years of education: Not on file   Highest education level: Not on file  Occupational History   Occupation: CChief Strategy Officerfor [employer]    Comment: retired   Occupation: cScientist, clinical (histocompatibility and immunogenetics)   Comment: full time from home   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 MARRIED TO 2ND WIFE.      02/06/2019: Lives with mother at this time assisting in her care,  while wife lives nearby, taking care of her own parents.   Works from home full time, but planning on retiring in next month to focus on health/well-being   Used to work out on treadmill, but has not in recent months   Motivated to return to exercise regimine and better diet control   Enjoys watching basketball, football      Social Determinants of Health  Financial Resource Strain: Low Risk  (08/09/2022)   Overall Financial Resource Strain (CARDIA)    Difficulty of Paying Living Expenses: Not hard at all  Food Insecurity: No Food Insecurity (08/09/2022)   Hunger Vital Sign    Worried About Running Out of Food in the Last Year: Never true    Ran Out of Food in the Last Year: Never true  Transportation Needs: No Transportation Needs (08/09/2022)   PRAPARE - Hydrologist (Medical): No    Lack of Transportation (Non-Medical): No  Physical Activity: Inactive (08/09/2022)   Exercise Vital Sign    Days of Exercise per Week: 0 days    Minutes of Exercise per Session: 0 min  Stress: No Stress Concern Present (08/09/2022)   [place]    Feeling of Stress : Not at all  Social Connections: [place] (08/09/2022)   Social Connection and Isolation Panel [NHANES]    Frequency of Communication with Friends and Family: More than three times a week    Frequency of Social Gatherings with Friends and Family: More than three times a week    Attends Religious Services: More than 4 times per year    Active Member of [employer] or Organizations: Yes    Attends Music therapist: More than 4 times per year    Marital Status: Married    Tobacco Counseling Counseling given: Not Answered Tobacco comments: Never smoked   Clinical Intake:  Pre-visit preparation completed: No Diabetic?  No  Interpreter Needed?: NoActivities of Daily Living    08/09/2022    2:42 PM 04/12/2022   10:16 AM  In your  present state of health, do you have any difficulty performing the following activities:  Hearing? 0 0  Vision? 0 0  Difficulty concentrating or making decisions? 0 0  Walking or climbing stairs? 0 0  Dressing or bathing? 0 0  Doing errands, shopping? 0   Preparing Food and eating ? N   Using the Toilet? N   In the past six months, have you accidently leaked urine? Y   Comment Followed by Urologist   Do you have problems with loss of bowel control? N   Managing your Medications? N   Managing your Finances? N   Housekeeping or managing your Housekeeping? N     Patient Care Team: Billie Ruddy, MD as PCP - General (Family Medicine) Danie Binder, MD (Inactive) (Gastroenterology) Deterding, Jeneen Rinks, MD as Consulting Physician (Nephrology) Rutherford Guys, MD as Consulting Physician (Ophthalmology) Deneise Lever, MD as Consulting Physician (Pulmonary Disease) Eloise Harman, DO as Consulting Physician (Internal Medicine)  Indicate any recent Medical Services you may have received from other than [facility] providers in the past year (date may be approximate).     Assessment:   This is a routine wellness examination for Kamin.  Hearing/Vision screen Hearing Screening - Comments:: Denies hearing difficulties   Vision Screening - Comments:: No vision difficulty.  Dietary issues and exercise activities discussed: Current Exercise Habits: The patient does not participate in regular exercise at present, Exercise limited by: orthopedic condition(s)   Goals Addressed               This Visit's Progress     Increase physical activity (pt-stated)         Depression Screen    08/09/2022    2:40 PM 07/27/2022    9:34 AM 12/26/2021   11:14 AM 07/29/2021  3:06 PM 07/29/2021    3:02 PM 08/13/2020    1:51 PM 02/06/2019    9:10 AM  PHQ 2/9 Scores  PHQ - 2 Score 0 0 1 0 0 0 0  PHQ- 9 Score 0 5 4   0 2    Fall Risk    08/09/2022    2:45 PM 07/27/2022    9:34 AM 12/26/2021   11:13 AM  07/29/2021    3:06 PM 08/13/2020    1:48 PM  Fall Risk   Falls in the past year? 0 1 0 0 1  Number falls in past yr: 0 1 0 0 0  Injury with Fall? 0 0 0 0 0  Risk for fall due to : No Fall Risks Other (Comment)  Impaired balance/gait History of fall(s);Medication side effect  Follow up Falls prevention discussed Falls evaluation completed  Falls evaluation completed Falls evaluation completed;Falls prevention discussed    FALL RISK PREVENTION PERTAINING TO THE HOME:  Any stairs in or around the home? Yes  If so, are there any without handrails? No  Home free of loose throw rugs in walkways, pet beds, electrical cords, etc? Yes  Adequate lighting in your home to reduce risk of falls? Yes   ASSISTIVE DEVICES UTILIZED TO PREVENT FALLS:  Life alert? No  Use of a cane, walker or w/c? No  Grab bars in the bathroom? Yes  Shower chair or bench in shower? No  Elevated toilet seat or a handicapped toilet? No   TIMED UP AND GO:  Was the test performed? Yes .  Length of time to ambulate 10 feet: 11 sec.   Gait steady and fast without use of assistive device  Cognitive Function:        08/09/2022    2:49 PM  6CIT Screen  What Year? 0 points  What month? 0 points  What time? 0 points  Count back from 20 0 points  Months in reverse 0 points  Repeat phrase 0 points  Total Score 0 points    Immunizations Immunization History  Administered Date(s) Administered   Fluad Quad(high Dose 65+) 08/13/2020   Influenza Split 08/04/2012, 08/04/2014, 09/01/2015   Influenza Whole 08/05/2013   Influenza, High Dose Seasonal PF 08/13/2020   Influenza-Unspecified 08/18/2018, 08/17/2021   PFIZER(Purple Top)SARS-COV-2 Vaccination 01/29/2020, 02/25/2020, 12/03/2021   Pneumococcal Conjugate-13 02/06/2019   Pneumococcal Polysaccharide-23 08/13/2020   Zoster, Live 08/05/2013    TDAP status: Due, Education has been provided regarding the importance of this vaccine. Advised may receive this vaccine  at local pharmacy or Health Dept. Aware to provide a copy of the vaccination record if obtained from local pharmacy or Health Dept. Verbalized acceptance and understanding.  Flu Vaccine status: Up to date  Pneumococcal vaccine status: Up to date  Covid-19 vaccine status: Completed vaccines  Qualifies for Shingles Vaccine? Yes   Zostavax completed No   Shingrix Completed?: No.    Education has been provided regarding the importance of this vaccine. Patient has been advised to call insurance company to determine out of pocket expense if they have not yet received this vaccine. Advised may also receive vaccine at local pharmacy or Health Dept. Verbalized acceptance and understanding.  Screening Tests Health Maintenance  Topic Date Due   HEMOGLOBIN A1C  02/21/2022   INFLUENZA VACCINE  07/04/2022   COVID-19 Vaccine (6 - Pfizer series) 08/25/2022 (Originally 01/28/2022)   Zoster Vaccines- Shingrix (1 of 2) 11/08/2022 (Originally 07/03/1999)   TETANUS/TDAP  08/10/2023 (Originally [birth year]02/1968)  Hepatitis C Screening  08/10/2023 (Originally 07/03/1969)   OPHTHALMOLOGY EXAM  10/04/2022   FOOT EXAM  11/24/2022   COLONOSCOPY (Pts 45-61yr Insurance coverage will need to be confirmed)  04/14/2032   Pneumonia Vaccine 71 Years old  Completed   HPV VACCINES  Aged Out    Health Maintenance  Health Maintenance Due  Topic Date Due   HEMOGLOBIN A1C  02/21/2022   INFLUENZA VACCINE  07/04/2022    Colorectal cancer screening: Type of screening: Colonoscopy. Completed 08/15/22. Repeat every 10 years  Lung Cancer Screening: (Low Dose CT Chest recommended if Age 73-80years, 30 pack-year currently smoking OR have quit w/in 15years.) does not qualify.     Additional Screening:  Hepatitis C Screening: does qualify; Completed Deferred  Vision Screening: Recommended annual ophthalmology exams for early detection of glaucoma and other disorders of the eye. Is the patient up to date with their annual eye  exam?  Yes  Who is the provider or what is the name of the office in which the patient attends annual eye exams? Dr SWarrick ParisianIf pt is not established with a provider, would they like to be referred to a provider to establish care? No .   Dental Screening: Recommended annual dental exams for proper oral hygiene  Community Resource Referral / Chronic Care Management:  CRR required this visit?  No   CCM required this visit?  No      Plan:     I have personally reviewed and noted the following in the patient's chart:   Medical and social history Use of alcohol, tobacco or illicit drugs  Current medications and supplements including opioid prescriptions. Patient is not currently taking opioid prescriptions. Functional ability and status Nutritional status Physical activity Advanced directives List of other physicians Hospitalizations, surgeries, and ER visits in previous 12 months Vitals Screenings to include cognitive, depression, and falls Referrals and appointments  In addition, I have reviewed and discussed with patient certain preventive protocols, quality metrics, and best practice recommendations. A written personalized care plan for preventive services as well as general preventive health recommendations were provided to patient.     BCriselda Peaches LPN   93/02/[id number]  Nurse Notes: Patient due labs Hemoglobin A1C

## 2022-08-09 NOTE — Patient Instructions (Addendum)
Bruce Weber , Thank you for taking time to come for your Medicare Wellness Visit. I appreciate your ongoing commitment to your health goals. Please review the following plan we discussed and let me know if I can assist you in the future.   Screening recommendations/referrals: Colonoscopy: Done 08/15/22 Repeat 10 Yrs Recommended yearly ophthalmology/optometry visit for glaucoma screening and checkup Recommended yearly dental visit for hygiene and checkup  Vaccinations: Influenza vaccine: Up to date Pneumococcal vaccine: Up to date Tdap vaccine: Deferred Shingles vaccine: Deferred   Covid-19: Done  Advanced directives: Advance directive discussed with you today. Even though you declined this today, please call our office should you change your mind, and we can give you the proper paperwork for you to fill out.   Conditions/risks identified: None  Next appointment: Follow up in one year for your annual wellness visit.    Preventive Care 39 Years and Older, Male  Preventive care refers to lifestyle choices and visits with your health care provider that can promote health and wellness. What does preventive care include? A yearly physical exam. This is also called an annual well check. Dental exams once or twice a year. Routine eye exams. Ask your health care provider how often you should have your eyes checked. Personal lifestyle choices, including: Daily care of your teeth and gums. Regular physical activity. Eating a healthy diet. Avoiding tobacco and drug use. Limiting alcohol use. Practicing safe sex. Taking low doses of aspirin every day. Taking vitamin and mineral supplements as recommended by your health care provider. What happens during an annual well check? The services and screenings done by your health care provider during your annual well check will depend on your age, overall health, lifestyle risk factors, and family history of disease. Counseling  Your health care  provider may ask you questions about your: Alcohol use. Tobacco use. Drug use. Emotional well-being. Home and relationship well-being. Sexual activity. Eating habits. History of falls. Memory and ability to understand (cognition). Work and work Statistician. Screening  You may have the following tests or measurements: Height, weight, and BMI. Blood pressure. Lipid and cholesterol levels. These may be checked every 5 years, or more frequently if you are over 22 years old. Skin check. Lung cancer screening. You may have this screening every year starting at age 13 if you have a 30-pack-year history of smoking and currently smoke or have quit within the past 15 years. Fecal occult blood test (FOBT) of the stool. You may have this test every year starting at age 70. Flexible sigmoidoscopy or colonoscopy. You may have a sigmoidoscopy every 5 years or a colonoscopy every 10 years starting at age 56. Prostate cancer screening. Recommendations will vary depending on your family history and other risks. Hepatitis C blood test. Hepatitis B blood test. Sexually transmitted disease (STD) testing. Diabetes screening. This is done by checking your blood sugar (glucose) after you have not eaten for a while (fasting). You may have this done every 1-3 years. Abdominal aortic aneurysm (AAA) screening. You may need this if you are a current or former smoker. Osteoporosis. You may be screened starting at age 45 if you are at high risk. Talk with your health care provider about your test results, treatment options, and if necessary, the need for more tests. Vaccines  Your health care provider may recommend certain vaccines, such as: Influenza vaccine. This is recommended every year. Tetanus, diphtheria, and acellular pertussis (Tdap, Td) vaccine. You may need a Td booster every 10 years.  Zoster vaccine. You may need this after age 68. Pneumococcal 13-valent conjugate (PCV13) vaccine. One dose is  recommended after age 74. Pneumococcal polysaccharide (PPSV23) vaccine. One dose is recommended after age 75. Talk to your health care provider about which screenings and vaccines you need and how often you need them. This information is not intended to replace advice given to you by your health care provider. Make sure you discuss any questions you have with your health care provider. Document Released: 12/17/2015 Document Revised: 08/09/2016 Document Reviewed: 09/21/2015 Elsevier Interactive Patient Education  2017 [employer] Prevention in the Home Falls can cause injuries. They can happen to people of all ages. There are many things you can do to make your home safe and to help prevent falls. What can I do on the outside of my home? Regularly fix the edges of walkways and driveways and fix any cracks. Remove anything that might make you trip as you walk through a door, such as a raised step or threshold. Trim any bushes or trees on the path to your home. Use bright outdoor lighting. Clear any walking paths of anything that might make someone trip, such as rocks or tools. Regularly check to see if handrails are loose or broken. Make sure that both sides of any steps have handrails. Any raised decks and porches should have guardrails on the edges. Have any leaves, snow, or ice cleared regularly. Use sand or salt on walking paths during winter. Clean up any spills in your garage right away. This includes oil or grease spills. What can I do in the bathroom? Use night lights. Install grab bars by the toilet and in the tub and shower. Do not use towel bars as grab bars. Use non-skid mats or decals in the tub or shower. If you need to sit down in the shower, use a plastic, non-slip stool. Keep the floor dry. Clean up any water that spills on the floor as soon as it happens. Remove soap buildup in the tub or shower regularly. Attach bath mats securely with double-sided non-slip rug  tape. Do not have throw rugs and other things on the floor that can make you trip. What can I do in the bedroom? Use night lights. Make sure that you have a light by your bed that is easy to reach. Do not use any sheets or blankets that are too big for your bed. They should not hang down onto the floor. Have a firm chair that has side arms. You can use this for support while you get dressed. Do not have throw rugs and other things on the floor that can make you trip. What can I do in the kitchen? Clean up any spills right away. Avoid walking on wet floors. Keep items that you use a lot in easy-to-reach places. If you need to reach something above you, use a strong step stool that has a grab bar. Keep electrical cords out of the way. Do not use floor polish or wax that makes floors slippery. If you must use wax, use non-skid floor wax. Do not have throw rugs and other things on the floor that can make you trip. What can I do with my stairs? Do not leave any items on the stairs. Make sure that there are handrails on both sides of the stairs and use them. Fix handrails that are broken or loose. Make sure that handrails are as long as the stairways. Check any carpeting to make sure that  it is firmly attached to the stairs. Fix any carpet that is loose or worn. Avoid having throw rugs at the top or bottom of the stairs. If you do have throw rugs, attach them to the floor with carpet tape. Make sure that you have a light switch at the top of the stairs and the bottom of the stairs. If you do not have them, ask someone to add them for you. What else can I do to help prevent falls? Wear shoes that: Do not have high heels. Have rubber bottoms. Are comfortable and fit you well. Are closed at the toe. Do not wear sandals. If you use a stepladder: Make sure that it is fully opened. Do not climb a closed stepladder. Make sure that both sides of the stepladder are locked into place. Ask someone to  hold it for you, if possible. Clearly mark and make sure that you can see: Any grab bars or handrails. First and last steps. Where the edge of each step is. Use tools that help you move around (mobility aids) if they are needed. These include: Canes. Walkers. Scooters. Crutches. Turn on the lights when you go into a dark area. Replace any light bulbs as soon as they burn out. Set up your furniture so you have a clear path. Avoid moving your furniture around. If any of your floors are uneven, fix them. If there are any pets around you, be aware of where they are. Review your medicines with your doctor. Some medicines can make you feel dizzy. This can increase your chance of falling. Ask your doctor what other things that you can do to help prevent falls. This information is not intended to replace advice given to you by your health care provider. Make sure you discuss any questions you have with your health care provider. Document Released: 09/16/2009 Document Revised: 04/27/2016 Document Reviewed: 12/25/2014 Elsevier Interactive Patient Education  2017 [employer].

## 2022-08-13 ENCOUNTER — Encounter: Payer: Self-pay | Admitting: Family Medicine

## 2022-08-30 ENCOUNTER — Other Ambulatory Visit: Payer: Self-pay | Admitting: Podiatry

## 2022-08-30 DIAGNOSIS — M779 Enthesopathy, unspecified: Secondary | ICD-10-CM

## 2022-09-05 ENCOUNTER — Ambulatory Visit (INDEPENDENT_AMBULATORY_CARE_PROVIDER_SITE_OTHER): Payer: Medicare Other

## 2022-09-05 ENCOUNTER — Ambulatory Visit: Payer: Medicare Other | Admitting: Podiatry

## 2022-09-05 DIAGNOSIS — S92354D Nondisplaced fracture of fifth metatarsal bone, right foot, subsequent encounter for fracture with routine healing: Secondary | ICD-10-CM | POA: Diagnosis not present

## 2022-09-05 DIAGNOSIS — M722 Plantar fascial fibromatosis: Secondary | ICD-10-CM | POA: Diagnosis not present

## 2022-09-05 DIAGNOSIS — R2681 Unsteadiness on feet: Secondary | ICD-10-CM | POA: Diagnosis not present

## 2022-09-05 NOTE — Patient Instructions (Signed)
Peripheral Neuropathy Peripheral neuropathy is a type of nerve damage. It affects nerves that carry signals between the spinal cord and the arms, legs, and the rest of the body (peripheral nerves). It does not affect nerves in the spinal cord or brain. In peripheral neuropathy, one nerve or a group of nerves may be damaged. Peripheral neuropathy is a broad category that includes many specific nerve disorders, like diabetic neuropathy, hereditary neuropathy, and carpal tunnel syndrome. What are the causes? This condition may be caused by: Certain diseases, such as: Diabetes. This is the most common cause of peripheral neuropathy. Autoimmune diseases, such as rheumatoid arthritis and systemic lupus erythematosus. Nerve diseases that are passed from parent to child (inherited). Kidney disease. Thyroid disease. Other causes may include: Nerve injury. Pressure or stress on a nerve that lasts a long time. Lack (deficiency) of B vitamins. This can result from alcoholism, poor diet, or a restricted diet. Infections. Some medicines, such as cancer medicines (chemotherapy). Poisonous (toxic) substances, such as lead and mercury. Too little blood flowing to the legs. In some cases, the cause of this condition is not known. What are the signs or symptoms? Symptoms of this condition depend on which of your nerves is damaged. Symptoms in the legs, hands, and arms can include: Loss of feeling (numbness) in the feet, hands, or both. Tingling in the feet, hands, or both. Burning pain. Very sensitive skin. Weakness. Not being able to move a part of the body (paralysis). Clumsiness or poor coordination. Muscle twitching. Loss of balance. Symptoms in other parts of the body can include: Not being able to control your bladder. Feeling dizzy. Sexual problems. How is this diagnosed? Diagnosing and finding the cause of peripheral neuropathy can be difficult. Your health care provider will take your  medical history and do a physical exam. A neurological exam will also be done. This involves checking things that are affected by your brain, spinal cord, and nerves (nervous system). For example, your health care provider will check your reflexes, how you move, and what you can feel. You may have other tests, such as: Blood tests. Electromyogram (EMG) and nerve conduction tests. These tests check nerve function and how well the nerves are controlling the muscles. Imaging tests, such as a CT scan or MRI, to rule out other causes of your symptoms. Removing a small piece of nerve to be examined in a lab (nerve biopsy). Removing and examining a small amount of the fluid that surrounds the brain and spinal cord (lumbar puncture). How is this treated? Treatment for this condition may involve: Treating the underlying cause of the neuropathy, such as diabetes, kidney disease, or vitamin deficiencies. Stopping medicines that can cause neuropathy, such as chemotherapy. Medicine to help relieve pain. Medicines may include: Prescription or over-the-counter pain medicine. Anti-seizure medicine. Antidepressants. Pain-relieving patches that are applied to painful areas of skin. Surgery to relieve pressure on a nerve or to destroy a nerve that is causing pain. Physical therapy to help improve movement and balance. Devices to help you move around (assistive devices). Follow these instructions at home: Medicines Take over-the-counter and prescription medicines only as told by your health care provider. Do not take any other medicines without first asking your health care provider. Ask your health care provider if the medicine prescribed to you requires you to avoid driving or using machinery. Lifestyle  Do not use any products that contain nicotine or tobacco. These products include cigarettes, chewing tobacco, and vaping devices, such as e-cigarettes. Smoking keeps   blood from reaching damaged nerves. If you  need help quitting, ask your health care provider. Avoid or limit alcohol. Too much alcohol can cause a vitamin B deficiency, and vitamin B is needed for healthy nerves. Eat a healthy diet. This includes: Eating foods that are high in fiber, such as beans, whole grains, and fresh fruits and vegetables. Limiting foods that are high in fat and processed sugars, such as fried or sweet foods. General instructions  If you have diabetes, work closely with your health care provider to keep your blood sugar under control. If you have numbness in your feet: Check every day for signs of injury or infection. Watch for redness, warmth, and swelling. Wear padded socks and comfortable shoes. These help protect your feet. Develop a good support system. Living with peripheral neuropathy can be stressful. Consider talking with a mental health specialist or joining a support group. Use assistive devices and attend physical therapy as told by your health care provider. This may include using a walker or a cane. Keep all follow-up visits. This is important. Where to find more information National Institute of Neurological Disorders: www.ninds.nih.gov Contact a health care provider if: You have new signs or symptoms of peripheral neuropathy. You are struggling emotionally from dealing with peripheral neuropathy. Your pain is not well controlled. Get help right away if: You have an injury or infection that is not healing normally. You develop new weakness in an arm or leg. You have fallen or do so frequently. Summary Peripheral neuropathy is when the nerves in the arms or legs are damaged, resulting in numbness, weakness, or pain. There are many causes of peripheral neuropathy, including diabetes, pinched nerves, vitamin deficiencies, autoimmune disease, and hereditary conditions. Diagnosing and finding the cause of peripheral neuropathy can be difficult. Your health care provider will take your medical  history, do a physical exam, and do tests, including blood tests and nerve function tests. Treatment involves treating the underlying cause of the neuropathy and taking medicines to help control pain. Physical therapy and assistive devices may also help. This information is not intended to replace advice given to you by your health care provider. Make sure you discuss any questions you have with your health care provider. Document Revised: 07/26/2021 Document Reviewed: 07/26/2021 Elsevier Patient Education  2023 Elsevier Inc.  

## 2022-09-05 NOTE — Progress Notes (Signed)
Subjective: Chief Complaint  Patient presents with   Fracture    Patient came in today for a right 5th metatarsal bone fracture, patient denies any pain, patient states he is doing better, X-Rays done today     71 year old male presents the office today for follow evaluation of right fifth metatarsal base fracture.  States he is doing well he is having any significant pain to his feet.  Recommend regular shoes.  Left foot is also doing much better.  States he been having some issues with his gait and balance.  Objective: AAO x3, NAD DP/PT pulses palpable bilaterally, CRT less than 3 seconds On the right foot on the area the fracture of the fifth metatarsal base there is no significant pain there is no edema or erythema.  Left foot no significant pain today where he was having pain previously.  There are some discomfort along the insertion of plantar fascia but there is no pain with lateral compression of calcaneus bilaterally.  No pain Achilles tendon. No pain with calf compression, swelling, warmth, erythema  Assessment: Fifth metatarsal fracture, healed; gait instability; Planter fasciitis Plan: -All treatment options discussed with the patient including all alternatives, risks, complications.  -Repeat x-rays were obtained and reviewed.  3 views of the right foot were obtained.  Interval healing noted across the previous fracture site. -At this time continue supportive shoe gear bilaterally.  Continue ice daily and topical creams to help with the pain if needed. -Referral to physical therapy.  Hopefully this will help with the gait as well as Planter fasciitis.  At this point I can see him back in the next 2 months if needed or sooner if there is any changes otherwise I will see him back as needed.  Trula Slade DPM

## 2022-09-28 ENCOUNTER — Other Ambulatory Visit (HOSPITAL_COMMUNITY): Payer: Self-pay

## 2022-09-29 ENCOUNTER — Ambulatory Visit (HOSPITAL_COMMUNITY)
Admission: RE | Admit: 2022-09-29 | Discharge: 2022-09-29 | Disposition: A | Discharge status: Home / Self Care | Payer: Medicare Other | Source: Ambulatory Visit | Attending: Nephrology | Admitting: Nephrology

## 2022-09-29 DIAGNOSIS — D631 Anemia in chronic kidney disease: Secondary | ICD-10-CM | POA: Insufficient documentation

## 2022-09-29 MED ORDER — SODIUM CHLORIDE 0.9 % IV SOLN
510.0000 mg | Freq: Once | INTRAVENOUS | Status: AC
  Administered 2022-09-29: 510 mg via INTRAVENOUS
  Filled 2022-09-29: qty 510

## 2022-10-05 NOTE — Therapy (Deleted)
OUTPATIENT PHYSICAL THERAPY LOWER EXTREMITY EVALUATION   Patient Name: Bruce Weber. MRN: [id number] DOB:1951/07/27, 71 y.o., male Today's Date: 10/05/2022    Past Medical History:  Diagnosis Date   Adenoma 11/03/2009   1.1 cm from TCS   DM (diabetes mellitus) (HCC)    GERD (gastroesophageal reflux disease)    High cholesterol    History of kidney stones    HTN (hypertension)    Kidney stones    Obesity (BMI 30-39.9) DEC 2011 223 LBS   Rectal bleeding 12/04/2009   secondary to hemorrhoids   Past Surgical History:  Procedure Laterality Date   CIRCUMCISION     COLONOSCOPY  Dec 2010 [facility]   MOD IH, SIMPLE ADENOMA 1.1 CM   COLONOSCOPY  11/25/2012   SLF: 1. Sessile polyp measuring 80m in size was found at the hepatic flesure; polypectomy was performed using snare cautery 2. Moderate diverticulosis was noted in the ascending colon and sigmoid colon. 3. The colon mucosa was otherwise normal 4. Large internal hemorrhoids.    COLONOSCOPY N/A 03/01/2017   Procedure: COLONOSCOPY;  Surgeon: SDanie Binder MD;  [facility];  Service: Endoscopy;  Laterality: N/A;  10:30 AM   COLONOSCOPY WITH PROPOFOL N/A 04/14/2022   Procedure: COLONOSCOPY WITH PROPOFOL;  Surgeon: CEloise Harman DO;  Location: [facility];  Service: Endoscopy;  Laterality: N/A;  11:00am   EYE SURGERY  05/2018   bilateral cataract surgery   HERNIA REPAIR     POLYPECTOMY  04/14/2022   Procedure: POLYPECTOMY;  Surgeon: CEloise Harman DO;  [facility]: [facility];  Service: Endoscopy;;   Patient Active Problem List   Diagnosis Date Noted   Essential hypertension 09/07/2020   Diet-controlled diabetes mellitus ([place] 09/07/2020   CKD (chronic kidney disease) stage 4, GFR 15-29 ml/min (HCC) 09/07/2020   SOB (shortness of breath) on exertion 09/07/2020   Foraminal stenosis of lumbar region 05/05/2020   Colon adenomas    Insomnia 01/11/2016   Internal hemorrhoids with complication 010/93/2355   Obstructive sleep apnea 07/27/2013   Seasonal and perennial allergic rhinitis 07/27/2013   Hx of colonic polyps 11/15/2012   UNSPECIFIED DISEASE OF THE JAWS 03/11/2010   COLONIC POLYPS, ADENOMATOUS 06/30/2009   HYPERLIPIDEMIA 10/13/2008   GLOBUS HYSTERICUS 10/13/2008   GASTROESOPHAGEAL REFLUX DISEASE 10/13/2008   RENAL CALCULUS 10/13/2008   Dysphagia, idiopathic 10/13/2008   ABDOMINAL PAIN 10/13/2008   H N P-LUMBAR 06/23/2008   SPINAL STENOSIS 06/23/2008   Chronic midline low back pain without sciatica 06/23/2008    PCP: SGrier Mitts REFERRING PROVIDER: MCelesta Gentile REFERRING DIAG: R26.81 (ICD-10-CM) - Gait instability M72.2 (ICD-10-CM) - Plantar fasciitis  THERAPY DIAG:  Muscle weakness,  Rt foot pain   Rationale for Evaluation and Treatment: Rehabilitation  ONSET DATE: ***  SUBJECTIVE:   SUBJECTIVE STATEMENT: ***  PERTINENT HISTORY: spinal stenosis with back pain Per Dr. WJacqualyn Posey Fifth metatarsal fracture, healed; gait instability; Planter fasciitis Plan: -All treatment options discussed with the patient including all alternatives, risks, complications.  -Repeat x-rays were obtained and reviewed.  3 views of the right foot were obtained.  Interval healing noted across the previous fracture site. -At this time continue supportive shoe gear bilaterally.  Continue ice daily and topical creams to help with the pain if needed. -Referral to physical therapy.  Hopefully this will help with the gait as well as Planter fasciitis.   PAIN:  Are you having pain? {OPRCPAIN:27236}  PRECAUTIONS: None  WEIGHT BEARING RESTRICTIONS: No  FALLS:  Has patient fallen in last 6 months? No  LIVING ENVIRONMENT: Lives with: lives with their spouse Lives in: House/apartment Stairs: Yes: {Stairs:24000} Has following equipment at home: {Assistive devices:23999}  OCCUPATION: retired   PLOF: Independent  PATIENT GOALS: no pain and to be able to walk better.    OBJECTIVE:    DIAGNOSTIC FINDINGS: see above   PATIENT SURVEYS:  FOTO ***  POSTURE: {posture:25561}  PALPATION: ***  LOWER EXTREMITY ROM:  Active ROM Right eval Left eval                                  Ankle dorsiflexion    Ankle plantarflexion    Ankle inversion    Ankle eversion     (Blank rows = not tested)  LOWER EXTREMITY MMT:  MMT Right eval Left eval  Hip flexion    Hip extension    Hip abduction    Hip adduction    Hip internal rotation    Hip external rotation    Knee flexion    Knee extension    Ankle dorsiflexion    Ankle plantarflexion    Ankle inversion    Ankle eversion     (Blank rows = not tested)    FUNCTIONAL TESTS:  30 seconds chair stand test 2 minute walk test: *** Single leg stance:  Rt:   ,  LT:     TODAY'S TREATMENT:                                                                                                                              DATE: 10/06/2022 Evaluation    PATIENT EDUCATION:  Education details: HEP Person educated: Patient Education method: Explanation and Verbal cues Education comprehension: returned demonstration  HOME EXERCISE PROGRAM: ***  ASSESSMENT:  CLINICAL IMPRESSION: Patient is a 71 y.o. male who was seen today for physical therapy evaluation and treatment for Rt plantar fascitis and gait instability. Evaluation demonstrates decreased ROM, decreased strength, decreased balance and decreased activity tolerance and increased pain.  Mr. Liptak will benefit from skilled PT to address these issues and maximize his functional status.    OBJECTIVE IMPAIRMENTS: decreased activity tolerance, decreased balance, decreased ROM, decreased strength, and pain.   ACTIVITY LIMITATIONS: carrying, standing, squatting, and locomotion level  PARTICIPATION LIMITATIONS: shopping and community activity  REHAB POTENTIAL: Good  CLINICAL DECISION MAKING: Stable/uncomplicated  EVALUATION COMPLEXITY:  Moderate   GOALS: Goals reviewed with patient? No  SHORT TERM GOALS: Target date: {follow up:25551}  *** Baseline: Goal status: {GOALSTATUS:25110}  2.  *** Baseline:  Goal status: {GOALSTATUS:25110}  3.  *** Baseline:  Goal status: {GOALSTATUS:25110}  4.  *** Baseline:  Goal status: {GOALSTATUS:25110}    LONG TERM GOALS: Target date: {follow up:25551}   *** Baseline:  Goal status: {GOALSTATUS:25110}  2.  *** Baseline:  Goal status: {GOALSTATUS:25110}  3.  *** Baseline:  Goal status: {GOALSTATUS:25110}  4.  *** Baseline:  Goal status: {GOALSTATUS:25110}  PLAN:  PT FREQUENCY: 2x/week  PT DURATION: 6 weeks  PLANNED INTERVENTIONS: Therapeutic exercises, Therapeutic activity, Neuromuscular re-education, Balance training, Gait training, Patient/Family education, Self Care, and Manual therapy  PLAN FOR NEXT SESSION: *** Rayetta Humphrey, PT CLT [phone]

## 2022-10-06 ENCOUNTER — Ambulatory Visit (HOSPITAL_COMMUNITY): Payer: Medicare Other | Admitting: Physical Therapy

## 2022-10-14 ENCOUNTER — Other Ambulatory Visit: Payer: Self-pay | Admitting: Family Medicine

## 2022-10-14 DIAGNOSIS — I10 Essential (primary) hypertension: Secondary | ICD-10-CM

## 2022-10-19 ENCOUNTER — Other Ambulatory Visit: Payer: Self-pay | Admitting: Family Medicine

## 2022-10-20 ENCOUNTER — Other Ambulatory Visit: Payer: Self-pay | Admitting: Orthopedic Surgery

## 2022-10-20 ENCOUNTER — Ambulatory Visit
Admission: EM | Admit: 2022-10-20 | Discharge: 2022-10-20 | Disposition: A | Discharge status: Home / Self Care | Payer: Medicare Other | Attending: Family Medicine | Admitting: Family Medicine

## 2022-10-20 DIAGNOSIS — J069 Acute upper respiratory infection, unspecified: Secondary | ICD-10-CM

## 2022-10-20 DIAGNOSIS — R062 Wheezing: Secondary | ICD-10-CM | POA: Diagnosis not present

## 2022-10-20 DIAGNOSIS — M545 Low back pain, unspecified: Secondary | ICD-10-CM

## 2022-10-20 MED ORDER — ALBUTEROL SULFATE HFA 108 (90 BASE) MCG/ACT IN AERS: 2.0000 | INHALATION_SPRAY | RESPIRATORY_TRACT | 0 refills | Status: DC | PRN

## 2022-10-20 MED ORDER — AZELASTINE HCL 0.1 % NA SOLN: 1.0000 | Freq: Two times a day (BID) | NASAL | 0 refills | Status: DC

## 2022-10-20 MED ORDER — IPRATROPIUM-ALBUTEROL 0.5-2.5 (3) MG/3ML IN SOLN
3.0000 mL | Freq: Once | RESPIRATORY_TRACT | Status: AC
  Administered 2022-10-20: 3 mL via RESPIRATORY_TRACT

## 2022-10-20 MED ORDER — METHYLPREDNISOLONE SODIUM SUCC 125 MG IJ SOLR
60.0000 mg | Freq: Once | INTRAMUSCULAR | Status: AC
  Administered 2022-10-20: 60 mg via INTRAMUSCULAR

## 2022-10-20 MED ORDER — PROMETHAZINE-DM 6.25-15 MG/5ML PO SYRP: 5.0000 mL | ORAL_SOLUTION | Freq: Four times a day (QID) | ORAL | 0 refills | Status: DC | PRN

## 2022-10-20 NOTE — ED Triage Notes (Signed)
Cough and congestion for the past 4 days. Has been taking Coricidin with some relief. Had negative home covid test today.

## 2022-10-20 NOTE — ED Provider Notes (Signed)
[facility]    CSN: [id number] Arrival date & time: 10/20/22  1631      History   Chief Complaint Chief Complaint  Patient presents with   Cough    HPI Bruce Weber. is a 71 y.o. male.   Patient presenting today with 4-day history of cough, congestion, chest tightness, wheezing, copious rhinorrhea, postnasal drainage.  Had some chills initially but no fever, body aches, abdominal pain, nausea vomiting or diarrhea.  Taking Coricidin HBP with some relief of symptoms.  Negative home COVID test.  No known sick contacts that he is aware of though did attend a funeral over the weekend prior to onset of symptoms.  No known history of chronic pulmonary disease.    Past Medical History:  Diagnosis Date   Adenoma 11/03/2009   1.1 cm from TCS   DM (diabetes mellitus) (HCC)    GERD (gastroesophageal reflux disease)    High cholesterol    History of kidney stones    HTN (hypertension)    Kidney stones    Obesity (BMI 30-39.9) DEC 2011 223 LBS   Rectal bleeding 12/04/2009   secondary to hemorrhoids    Patient Active Problem List   Diagnosis Date Noted   Essential hypertension 09/07/2020   Diet-controlled diabetes mellitus ([place]) 09/07/2020   CKD (chronic kidney disease) stage 4, GFR 15-29 ml/min (HCC) 09/07/2020   SOB (shortness of breath) on exertion 09/07/2020   Foraminal stenosis of lumbar region 05/05/2020   Colon adenomas    Insomnia 01/11/2016   Internal hemorrhoids with complication 76/72/0947   Obstructive sleep apnea 07/27/2013   Seasonal and perennial allergic rhinitis 07/27/2013   Hx of colonic polyps 11/15/2012   UNSPECIFIED DISEASE OF THE JAWS 03/11/2010   COLONIC POLYPS, ADENOMATOUS 06/30/2009   HYPERLIPIDEMIA 10/13/2008   GLOBUS HYSTERICUS 10/13/2008   GASTROESOPHAGEAL REFLUX DISEASE 10/13/2008   RENAL CALCULUS 10/13/2008   Dysphagia, idiopathic 10/13/2008   ABDOMINAL PAIN 10/13/2008   H N P-LUMBAR 06/23/2008   SPINAL STENOSIS 06/23/2008    Chronic midline low back pain without sciatica 06/23/2008    Past Surgical History:  Procedure Laterality Date   CIRCUMCISION     COLONOSCOPY  Dec 2010 [facility]   MOD IH, SIMPLE ADENOMA 1.1 CM   COLONOSCOPY  11/25/2012   SLF: 1. Sessile polyp measuring 52m in size was found at the hepatic flesure; polypectomy was performed using snare cautery 2. Moderate diverticulosis was noted in the ascending colon and sigmoid colon. 3. The colon mucosa was otherwise normal 4. Large internal hemorrhoids.    COLONOSCOPY N/A 03/01/2017   Procedure: COLONOSCOPY;  Surgeon: SDanie Binder MD;  [facility];  Service: Endoscopy;  Laterality: N/A;  10:30 AM   COLONOSCOPY WITH PROPOFOL N/A 04/14/2022   Procedure: COLONOSCOPY WITH PROPOFOL;  Surgeon: CEloise Harman DO;  [facility];  Service: Endoscopy;  Laterality: N/A;  11:00am   EYE SURGERY  05/2018   bilateral cataract surgery   HERNIA REPAIR     POLYPECTOMY  04/14/2022   Procedure: POLYPECTOMY;  Surgeon: CEloise Harman DO;  [facility];  Service: Endoscopy;;       Home Medications    Prior to Admission medications   Medication Sig Start Date End Date Taking? Authorizing Provider  albuterol (VENTOLIN HFA) 108 (90 Base) MCG/ACT inhaler Inhale 2 puffs into the lungs every 4 (four) hours as needed for wheezing or shortness of breath. 10/20/22  Yes LVolney American PA-C  azelastine (ASTELIN) 0.1 % nasal spray Place 1 spray into both nostrils 2 (two) times daily. Use in each nostril as directed 10/20/22  Yes Volney American, PA-C  promethazine-dextromethorphan (PROMETHAZINE-DM) 6.25-15 MG/5ML syrup Take 5 mLs by mouth 4 (four) times daily as needed. 10/20/22  Yes Volney American, PA-C  acetaminophen (TYLENOL) 325 MG tablet Take 2 tablets (650 mg total) by mouth every 6 (six) hours as needed for moderate pain. 01/26/22   Jaynee Eagles, PA-C  allopurinol (ZYLOPRIM) 100 MG tablet Take 300 mg by mouth  daily.     [provider]  amLODipine (NORVASC) 10 MG tablet TAKE 1 TABLET BY MOUTH EVERYDAY AT BEDTIME 07/20/22   Billie Ruddy, MD  atorvastatin (LIPITOR) 10 MG tablet TAKE 1 TABLET BY MOUTH EVERY DAY (NEED PHYSICAL) 10/19/22   Billie Ruddy, MD  calcitRIOL (ROCALTROL) 0.25 MCG capsule Take 0.25 mcg by mouth at bedtime.     [provider]  clomiPHENE (CLOMID) 50 MG tablet Take 25 mg by mouth at bedtime.     [provider]  cyclobenzaprine (FLEXERIL) 5 MG tablet TAKE 1 TABLET BY MOUTH AT BEDTIME AS NEEDED FOR MUSCLE SPASMS. Patient not taking: Reported on 12/26/2021 05/30/21   Billie Ruddy, MD  famciclovir [facility]) 500 MG tablet Take 1,000 mg by mouth 2 (two) times daily as needed (fever blisters). 02/19/17   [provider]  famotidine (PEPCID) 40 MG tablet TAKE 1 TABLET (40 MG TOTAL) BY MOUTH DAILY. WITH BREAKFAST 11/22/21   Annitta Needs, NP  fluticasone [facility]) 50 MCG/ACT nasal spray Place 1 spray into both nostrils daily for 14 days. Patient taking differently: Place 1 spray into both nostrils at bedtime as needed for allergies. 09/29/20 04/05/22  Avegno, Darrelyn Hillock, FNP  furosemide (LASIX) 40 MG tablet Take 40 mg by mouth daily.    [provider]  gabapentin (NEURONTIN) 100 MG capsule TAKE 1 CAPSULE BY MOUTH THREE TIMES A DAY 04/19/22   Carole Civil, MD  hydrALAZINE (APRESOLINE) 25 MG tablet Take 25 mg by mouth 3 (three) times daily.    [provider]  HYDROcodone-acetaminophen (NORCO/VICODIN) 5-325 MG tablet Take 1 tablet by mouth every 6 (six) hours as needed for severe pain. 01/26/22   Jaynee Eagles, PA-C  propranolol (INDERAL) 80 MG tablet TAKE 1 TABLET BY MOUTH TWICE A DAY 10/16/22   Billie Ruddy, MD  rosuvastatin (CRESTOR) 5 MG tablet Take 5 mg by mouth daily.    [provider]  tamsulosin (FLOMAX) 0.4 MG CAPS capsule Take 0.4 mg by mouth daily. 05/25/20   [provider]  traZODone (DESYREL) 50  MG tablet TAKE 1 TABLET BY MOUTH EVERYDAY AT BEDTIME 05/25/22   Young, Tarri Fuller D, MD  triamcinolone (KENALOG) 0.025 % ointment Apply 1 application topically 2 (two) times daily. Patient not taking: Reported on 04/05/2022 09/07/20   Billie Ruddy, MD  cetirizine (ZYRTEC ALLERGY) 10 MG tablet Take 1 tablet (10 mg total) by mouth daily. 09/29/20 09/29/20  Emerson Monte, FNP    Family History Family History  Problem Relation Age of Onset   Lung cancer Father    High blood pressure Father    Colon cancer Neg Hx    Colon polyps Neg Hx     Social History Social History   Tobacco Use   Smoking status: Never    Passive exposure: Never   Smokeless tobacco: Never   Tobacco comments:    Never smoked  Vaping Use  Vaping Use: Never used  Substance Use Topics   Alcohol use: No   Drug use: No     Allergies   Dye fdc blue [brilliant blue fcf (fd&c blue #1)] and Iodinated contrast media   Review of Systems Review of Systems Per HPI  Physical Exam Triage Vital Signs ED Triage Vitals [10/20/22 1639]  Enc Vitals Group     BP (!) 156/75     Pulse Rate 67     Resp 16     Temp 98.3 F (36.8 C)     Temp Source Oral     SpO2 95 %     Weight      Height      Head Circumference      Peak Flow      Pain Score 0     Pain Loc      Pain Edu?      Excl. in [place]?    No data found.  Updated Vital Signs BP (!) 156/75 (BP Location: Right Arm)   Pulse 67   Temp 98.3 F (36.8 C) (Oral)   Resp 16   SpO2 95%   Visual Acuity Right Eye Distance:   Left Eye Distance:   Bilateral Distance:    Right Eye Near:   Left Eye Near:    Bilateral Near:     Physical Exam Vitals and nursing note reviewed.  Constitutional:      Appearance: He is well-developed.  HENT:     Head: Atraumatic.     Right Ear: External ear normal.     Left Ear: External ear normal.     Nose: Rhinorrhea present.     Mouth/Throat:     Pharynx: Posterior oropharyngeal erythema present. No oropharyngeal  exudate.  Eyes:     Conjunctiva/sclera: Conjunctivae normal.     Pupils: Pupils are equal, round, and reactive to light.  Cardiovascular:     Rate and Rhythm: Normal rate and regular rhythm.  Pulmonary:     Effort: Pulmonary effort is normal. No respiratory distress.     Breath sounds: Wheezing present. No rales.     Comments: Moderate wheezes bilaterally, speaking full sentences and breathing comfortably on room air Musculoskeletal:        General: Normal range of motion.     Cervical back: Normal range of motion and neck supple.  Lymphadenopathy:     Cervical: No cervical adenopathy.  Skin:    General: Skin is warm and dry.  Neurological:     Mental Status: He is alert and oriented to person, place, and time.  Psychiatric:        Behavior: Behavior normal.      UC Treatments / Results  Labs (all labs ordered are listed, but only abnormal results are displayed) Labs Reviewed - No data to display  EKG   Radiology No results found.  Procedures Procedures (including critical care time)  Medications Ordered in UC Medications  methylPREDNISolone sodium succinate (SOLU-MEDROL) 125 mg/2 mL injection 60 mg (has no administration in time range)  ipratropium-albuterol (DUONEB) 0.5-2.5 (3) MG/3ML nebulizer solution 3 mL (3 mLs Nebulization Given 10/20/22 1655)    Initial Impression / Assessment and Plan / UC Course  I have reviewed the triage vital signs and the nursing notes.  Pertinent labs & imaging results that were available during my care of the patient were reviewed by me and considered in my medical decision making (see chart for details).     Suspect viral upper  respiratory infection leading to some bronchitis.  DuoNeb treatment given in clinic with moderate relief of symptoms and wheezes.  We will treat with IM Solu-Medrol, albuterol as needed, Phenergan DM, supportive over-the-counter medications and home care.  Return for worsening symptoms.  Final Clinical  Impressions(s) / UC Diagnoses   Final diagnoses:  Viral URI with cough  Wheezing   Discharge Instructions   None    ED Prescriptions     Medication Sig Dispense Auth. Provider   albuterol (VENTOLIN HFA) 108 (90 Base) MCG/ACT inhaler Inhale 2 puffs into the lungs every 4 (four) hours as needed for wheezing or shortness of breath. 18 g Volney American, [place]   azelastine (ASTELIN) 0.1 % nasal spray Place 1 spray into both nostrils 2 (two) times daily. Use in each nostril as directed 30 mL Volney American, PA-C   promethazine-dextromethorphan (PROMETHAZINE-DM) 6.25-15 MG/5ML syrup Take 5 mLs by mouth 4 (four) times daily as needed. 100 mL Volney American, [place]      PDMP not reviewed this encounter.   Volney American, [place] 10/20/22 1717

## 2022-10-28 NOTE — Therapy (Signed)
OUTPATIENT PHYSICAL THERAPY LOWER EXTREMITY EVALUATION   Patient Name: Bruce Weber. MRN: [id number] DOB:03-16-1951, 71 y.o., male Today's Date: 10/31/2022  END OF SESSION:  PT End of Session - 10/31/22 1304     Visit Number 1    Number of Visits 8    Date for PT Re-Evaluation 11/28/22    Authorization Type [employer] Medicare    Progress Note Due on Visit 10    PT Start Time 1300    PT Stop Time 1340    PT Time Calculation (min) 40 min    Activity Tolerance Patient tolerated treatment well             Past Medical History:  Diagnosis Date   Adenoma 11/03/2009   1.1 cm from TCS   DM (diabetes mellitus) (HCC)    GERD (gastroesophageal reflux disease)    High cholesterol    History of kidney stones    HTN (hypertension)    Kidney stones    Obesity (BMI 30-39.9) DEC 2011 223 LBS   Rectal bleeding 12/04/2009   secondary to hemorrhoids   Past Surgical History:  Procedure Laterality Date   CIRCUMCISION     COLONOSCOPY  Dec 2010 [facility]   MOD IH, SIMPLE ADENOMA 1.1 CM   COLONOSCOPY  11/25/2012   SLF: 1. Sessile polyp measuring 66m in size was found at the hepatic flesure; polypectomy was performed using snare cautery 2. Moderate diverticulosis was noted in the ascending colon and sigmoid colon. 3. The colon mucosa was otherwise normal 4. Large internal hemorrhoids.    COLONOSCOPY N/A 03/01/2017   Procedure: COLONOSCOPY;  Surgeon: SDanie Binder MD;  [facility];  Service: Endoscopy;  Laterality: N/A;  10:30 AM   COLONOSCOPY WITH PROPOFOL N/A 04/14/2022   Procedure: COLONOSCOPY WITH PROPOFOL;  Surgeon: CEloise Harman DO;  [facility];  Service: Endoscopy;  Laterality: N/A;  11:00am   EYE SURGERY  05/2018   bilateral cataract surgery   HERNIA REPAIR     POLYPECTOMY  04/14/2022   Procedure: POLYPECTOMY;  Surgeon: CEloise Harman DO;  [facility];  Service: Endoscopy;;   Patient Active Problem List   Diagnosis Date Noted   Essential  hypertension 09/07/2020   Diet-controlled diabetes mellitus ([place] 09/07/2020   CKD (chronic kidney disease) stage 4, GFR 15-29 ml/min (HCC) 09/07/2020   SOB (shortness of breath) on exertion 09/07/2020   Foraminal stenosis of lumbar region 05/05/2020   Colon adenomas    Insomnia 01/11/2016   Internal hemorrhoids with complication 003/54/6568  Obstructive sleep apnea 07/27/2013   Seasonal and perennial allergic rhinitis 07/27/2013   Hx of colonic polyps 11/15/2012   UNSPECIFIED DISEASE OF THE JAWS 03/11/2010   COLONIC POLYPS, ADENOMATOUS 06/30/2009   HYPERLIPIDEMIA 10/13/2008   GLOBUS HYSTERICUS 10/13/2008   GASTROESOPHAGEAL REFLUX DISEASE 10/13/2008   RENAL CALCULUS 10/13/2008   Dysphagia, idiopathic 10/13/2008   ABDOMINAL PAIN 10/13/2008   H N P-LUMBAR 06/23/2008   SPINAL STENOSIS 06/23/2008   Chronic midline low back pain without sciatica 06/23/2008    PCP: SBillie Ruddy MD  REFERRING PROVIDER: MTrula Slade DPM  REFERRING DIAG: [id number](ICD-10-CM) - Gait instability M72.2 (ICD-10-CM) - Plantar fasciitis  THERAPY DIAG:  Difficulty in walking, not elsewhere classified - Plan: PT plan of care cert/re-cert  Plantar fasciitis of right foot - Plan: PT plan of care cert/re-cert  Rationale for Evaluation and Treatment: Rehabilitation  ONSET DATE: Feb 2022   SUBJECTIVE:  SUBJECTIVE STATEMENT: Fractured a bone in right foot last feb; fell down bleachers; 2 days later went and got an x-Avory.  Dr. Jacqualyn Posey treated him for that.  Walks "like a drunk"; thinks this is a combination of his foot and his back; tried orthotics in shoe; didn't help much.   PERTINENT HISTORY: History of 6 herniated discs Takes care of his 19 yo mother Hx of 2 injections in his back; the latest about 9 months  PAIN:  Are you having pain? Yes: NPRS scale: 2-7/10 Pain location: right foot and sometimes the back Pain description: throbbing pain Aggravating factors: sitting around and then first  few steps Relieving factors: taking an Epson salt bath  PRECAUTIONS: None  WEIGHT BEARING RESTRICTIONS: No  FALLS:  Has patient fallen in last 6 months? No  LIVING ENVIRONMENT: Lives with: lives with their spouse Lives in: House/apartment Stairs: Yes: Internal: 8 steps; on left going up and External: 3 steps; on right going up, on left going up, and can reach both Has following equipment at home: Crutches  OCCUPATION: works behind a Teaching laboratory technician  PLOF: Independent  PATIENT GOALS: build up some endurance; walk for longer periods of time; walk for fitness  NEXT MD VISIT:   OBJECTIVE:   DIAGNOSTIC FINDINGS: multiple x-rays  PATIENT SURVEYS:  LEFS 38/80; 47.5%  COGNITION: Overall cognitive status: Within functional limits for tasks assessed     SENSATION: Occassionally right foot   POSTURE: rounded shoulders and forward head  PALPATION: Tenderness right plantar fascia; palpable nodules in fascia  LOWER EXTREMITY ROM:  Active ROM Right eval Left eval  Hip flexion    Hip extension    Hip abduction    Hip adduction    Hip internal rotation    Hip external rotation    Knee flexion    Knee extension    Ankle dorsiflexion 12 20  Ankle plantarflexion 40 46  Ankle inversion    Ankle eversion     (Blank rows = not tested)  LOWER EXTREMITY MMT:  MMT Right eval Left eval  Hip flexion 4+ 5  Hip extension    Hip abduction    Hip adduction    Hip internal rotation    Hip external rotation    Knee flexion    Knee extension 4+ 5  Ankle dorsiflexion 4+ 5  Ankle plantarflexion    Ankle inversion    Ankle eversion     (Blank rows = not tested)  FUNCTIONAL TESTS:  5 times sit to stand: 9.30 sec no UE assist; tends to lean back on heels Timed up and go (TUG): 10.53 sec SLS 5" each foot   GAIT: Distance walked: 50 ft Assistive device utilized: none Level of assistance: SBA Comments: occassionally takes a misstep   TODAY'S TREATMENT:  DATE: 10/31/22 physical therapy evaluation and HEP instruction   Patient education: Education details: Patient educated on exam findings, POC, scope of PT, HEP. Person educated: Patient Education method: Explanation, Demonstration, and Handouts Education comprehension: verbalized understanding, returned demonstration, verbal cues required, and tactile cues required   HOME EXERCISE PROGRAM: Access Code: [email or web address] URL: [email or web address] Date: 10/31/2022 Prepared by: [facility]  Exercises - Standing Single Leg Stance with Counter Support  - 1 x daily - 7 x weekly - 1 sets - 3 reps - 10 sec hold - Standing Tandem Balance with Counter Support  - 1 x daily - 7 x weekly - 1 sets - 3 reps - 10 sec hold - Long Sitting Calf Stretch with Strap  - 1 x daily - 7 x weekly - 1 sets - 5 reps - 20" hold  ASSESSMENT:  CLINICAL IMPRESSION: Patient is a 71 y.o. male. who was seen today for physical therapy evaluation and treatment for right plantar fasciitis and gait instability. Patient demonstrates muscle weakness, impaired balance, reduced ROM, and fascial restrictions which are likely contributing to symptoms of pain and are negatively impacting patient ability to perform ADLs and functional mobility tasks. Patient will benefit from skilled physical therapy services to address these deficits to reduce pain and improve level of function with ADLs and functional mobility tasks.   OBJECTIVE IMPAIRMENTS: Abnormal gait, decreased activity tolerance, decreased balance, decreased coordination, decreased endurance, decreased mobility, difficulty walking, decreased ROM, decreased strength, hypomobility, increased fascial restrictions, impaired perceived functional ability, impaired flexibility, impaired sensation, and pain.   ACTIVITY LIMITATIONS: carrying, lifting, bending, sitting, standing,  squatting, sleeping, stairs, transfers, dressing, locomotion level, and caring for others  PARTICIPATION LIMITATIONS: meal prep, cleaning, laundry, shopping, community activity, and occupation  REHAB POTENTIAL: Good  CLINICAL DECISION MAKING: Stable/uncomplicated  EVALUATION COMPLEXITY: Low   GOALS: Goals reviewed with patient? No  SHORT TERM GOALS: Target date: 11/14/22 Patient will be independent with initial HEP  Baseline: Goal status: INITIAL  2.   Patient will report at least 50% improvement in overall symptoms and/or function to demonstrate improved functional mobility  Baseline:  Goal status: INITIAL   LONG TERM GOALS: Target date: 11/28/2022  Patient will be independent in self management strategies to improve quality of life and functional outcomes.   Baseline:  Goal status: INITIAL  2.   Patient will report at least 75% improvement in overall symptoms and/or function to demonstrate improved functional mobility  Baseline:  Goal status: INITIAL  3.  Patient will improve FOTO score to predicted value  Baseline:  Goal status: INITIAL  4.  Patient will improve TUG score to 9 sec or less to demonstrate improved functional mobility and decreased fall risk Baseline: 10.53 Goal status: INITIAL  5.  Patient will improve SLS each leg to 10" to demonstrate improved standing balance Baseline: 5" each leg Goal status: INITIAL   PLAN:  PT FREQUENCY: 2x/week  PT DURATION: 4 weeks  Planned Interventions:Therapeutic exercises, Therapeutic activity, Neuromuscular re-education, Balance training, Gait training, Patient/Family education, Joint manipulation, Joint mobilization, Stair training, Orthotic/Fit training, DME instructions, Aquatic Therapy, Dry Needling, Electrical stimulation, Spinal manipulation, Spinal mobilization, Cryotherapy, Moist heat, Compression bandaging, scar mobilization, Splintting, Taping, Traction, Ultrasound, Ionotophoresis '4mg'$ /ml  Dexamethasone, and Manual therapy  PLAN FOR NEXT SESSION: Review HEP and set rehab goals   2:14 PM, 10/31/22 Dmitriy Gair [place] MPT [facility] [place] [phone] [phone]

## 2022-10-31 ENCOUNTER — Ambulatory Visit (HOSPITAL_COMMUNITY): Payer: Medicare Other | Attending: Podiatry

## 2022-10-31 DIAGNOSIS — R262 Difficulty in walking, not elsewhere classified: Secondary | ICD-10-CM | POA: Diagnosis not present

## 2022-10-31 DIAGNOSIS — R2681 Unsteadiness on feet: Secondary | ICD-10-CM | POA: Insufficient documentation

## 2022-10-31 DIAGNOSIS — M722 Plantar fascial fibromatosis: Secondary | ICD-10-CM | POA: Insufficient documentation

## 2022-11-02 ENCOUNTER — Encounter (HOSPITAL_COMMUNITY): Payer: Medicare Other

## 2022-11-06 ENCOUNTER — Encounter (HOSPITAL_COMMUNITY): Payer: Medicare Other

## 2022-11-09 ENCOUNTER — Ambulatory Visit (HOSPITAL_COMMUNITY): Payer: Medicare Other | Attending: Podiatry | Admitting: Physical Therapy

## 2022-11-09 DIAGNOSIS — R293 Abnormal posture: Secondary | ICD-10-CM | POA: Insufficient documentation

## 2022-11-09 DIAGNOSIS — M542 Cervicalgia: Secondary | ICD-10-CM | POA: Diagnosis present

## 2022-11-09 DIAGNOSIS — R262 Difficulty in walking, not elsewhere classified: Secondary | ICD-10-CM | POA: Insufficient documentation

## 2022-11-09 DIAGNOSIS — M722 Plantar fascial fibromatosis: Secondary | ICD-10-CM | POA: Diagnosis present

## 2022-11-09 NOTE — Therapy (Signed)
OUTPATIENT PHYSICAL THERAPY Treatment   Patient Name: Bruce Weber. MRN: [id number] DOB:05-16-1951, 71 y.o., male Today's Date: 11/09/2022  END OF SESSION:  PT End of Session - 11/09/22 1506     Visit Number 2    Number of Visits 8    Date for PT Re-Evaluation 11/28/22    Authorization Type [employer] Medicare    Progress Note Due on Visit 10    PT Start Time 1508    PT Stop Time 1550    PT Time Calculation (min) 42 min    Activity Tolerance Patient tolerated treatment well             Past Medical History:  Diagnosis Date   Adenoma 11/03/2009   1.1 cm from TCS   DM (diabetes mellitus) (HCC)    GERD (gastroesophageal reflux disease)    High cholesterol    History of kidney stones    HTN (hypertension)    Kidney stones    Obesity (BMI 30-39.9) DEC 2011 223 LBS   Rectal bleeding 12/04/2009   secondary to hemorrhoids   Past Surgical History:  Procedure Laterality Date   CIRCUMCISION     COLONOSCOPY  Dec 2010 [facility]   MOD IH, SIMPLE ADENOMA 1.1 CM   COLONOSCOPY  11/25/2012   SLF: 1. Sessile polyp measuring 52m in size was found at the hepatic flesure; polypectomy was performed using snare cautery 2. Moderate diverticulosis was noted in the ascending colon and sigmoid colon. 3. The colon mucosa was otherwise normal 4. Large internal hemorrhoids.    COLONOSCOPY N/A 03/01/2017   Procedure: COLONOSCOPY;  Surgeon: SDanie Binder MD;  [facility];  Service: Endoscopy;  Laterality: N/A;  10:30 AM   COLONOSCOPY WITH PROPOFOL N/A 04/14/2022   Procedure: COLONOSCOPY WITH PROPOFOL;  Surgeon: CEloise Harman DO;  [facility];  Service: Endoscopy;  Laterality: N/A;  11:00am   EYE SURGERY  05/2018   bilateral cataract surgery   HERNIA REPAIR     POLYPECTOMY  04/14/2022   Procedure: POLYPECTOMY;  Surgeon: CEloise Harman DO;  [facility];  Service: Endoscopy;;   Patient Active Problem List   Diagnosis Date Noted   Essential hypertension  09/07/2020   Diet-controlled diabetes mellitus ([place] 09/07/2020   CKD (chronic kidney disease) stage 4, GFR 15-29 ml/min (HCC) 09/07/2020   SOB (shortness of breath) on exertion 09/07/2020   Foraminal stenosis of lumbar region 05/05/2020   Colon adenomas    Insomnia 01/11/2016   Internal hemorrhoids with complication 018/84/1660  Obstructive sleep apnea 07/27/2013   Seasonal and perennial allergic rhinitis 07/27/2013   Hx of colonic polyps 11/15/2012   UNSPECIFIED DISEASE OF THE JAWS 03/11/2010   COLONIC POLYPS, ADENOMATOUS 06/30/2009   HYPERLIPIDEMIA 10/13/2008   GLOBUS HYSTERICUS 10/13/2008   GASTROESOPHAGEAL REFLUX DISEASE 10/13/2008   RENAL CALCULUS 10/13/2008   Dysphagia, idiopathic 10/13/2008   ABDOMINAL PAIN 10/13/2008   H N P-LUMBAR 06/23/2008   SPINAL STENOSIS 06/23/2008   Chronic midline low back pain without sciatica 06/23/2008    PCP: SBillie Ruddy MD  REFERRING PROVIDER: MTrula Slade DPM  REFERRING DIAG: [id number](ICD-10-CM) - Gait instability M72.2 (ICD-10-CM) - Plantar fasciitis  THERAPY DIAG:  Difficulty in walking, not elsewhere classified  Plantar fasciitis of right foot  Neck pain  Abnormal posture  Rationale for Evaluation and Treatment: Rehabilitation  ONSET DATE: Feb 2022   SUBJECTIVE:   SUBJECTIVE STATEMENT: Pt states he has not had any pain  today.  States he walks "like a drunk" sometimes.  Reports he's not done his exercises due to his mother getting sick and has been up at the hospital with her.    Evaluation: Fractured a bone in right foot last feb; fell down bleachers; 2 days later went and got an x-[place].  Dr. Jacqualyn Posey treated him for that.  Walks "like a drunk"; thinks this is a combination of his foot and his back; tried orthotics in shoe; didn't help much.   PERTINENT HISTORY: History of 6 herniated discs Takes care of his 33 yo mother Hx of 2 injections in his back; the latest about 9 months  PAIN:  Are you having pain?  No  PRECAUTIONS: None  WEIGHT BEARING RESTRICTIONS: No  FALLS:  Has patient fallen in last 6 months? No  LIVING ENVIRONMENT: Lives with: lives with their spouse Lives in: House/apartment Stairs: Yes: Internal: 8 steps; on left going up and External: 3 steps; on right going up, on left going up, and can reach both Has following equipment at home: Crutches  OCCUPATION: works behind a Teaching laboratory technician  PLOF: Independent  PATIENT GOALS: build up some endurance; walk for longer periods of time; walk for fitness  NEXT MD VISIT:   OBJECTIVE:   DIAGNOSTIC FINDINGS: multiple x-rays  PATIENT SURVEYS:  LEFS 38/80; 47.5%  COGNITION: Overall cognitive status: Within functional limits for tasks assessed     SENSATION: Occassionally right foot   POSTURE: rounded shoulders and forward head  PALPATION: Tenderness right plantar fascia; palpable nodules in fascia  LOWER EXTREMITY ROM:  Active ROM Right eval Left eval  Hip flexion    Hip extension    Hip abduction    Hip adduction    Hip internal rotation    Hip external rotation    Knee flexion    Knee extension    Ankle dorsiflexion 12 20  Ankle plantarflexion 40 46  Ankle inversion    Ankle eversion     (Blank rows = not tested)  LOWER EXTREMITY MMT:  MMT Right eval Left eval  Hip flexion 4+ 5  Hip extension    Hip abduction    Hip adduction    Hip internal rotation    Hip external rotation    Knee flexion    Knee extension 4+ 5  Ankle dorsiflexion 4+ 5  Ankle plantarflexion    Ankle inversion    Ankle eversion     (Blank rows = not tested)  FUNCTIONAL TESTS:  5 times sit to stand: 9.30 sec no UE assist; tends to lean back on heels Timed up and go (TUG): 10.53 sec SLS 5" each foot   GAIT: Distance walked: 50 ft Assistive device utilized: none Level of assistance: SBA Comments: occassionally takes a misstep   TODAY'S TREATMENT:  DATE:  11/09/22 Goal review and HEP Standing: SLS 5X max of each without UE Lt:5", Rt:8"  Tandem stance each LE lead 30"X 2 each  Slant board stretch 3X30"  Plantar fascia stretch on 6" box 2X30" each  Heelraises 20X  Toeraises 20X  Vectors 5X5" with 1 UE assist  Seated:  sit to stand no UE 10X from standard chair  Gastroc long sitting with towel (HEP review)   10/31/22 physical therapy evaluation and HEP instruction   Patient education: Education details: Patient educated on exam findings, POC, scope of PT, HEP. Person educated: Patient Education method: Explanation, Demonstration, and Handouts Education comprehension: verbalized understanding, returned demonstration, verbal cues required, and tactile cues required   HOME EXERCISE PROGRAM: Access Code: [email or web address] URL: [email or web address] Date: 10/31/2022 Prepared by: [facility]  Exercises - Standing Single Leg Stance with Counter Support  - 1 x daily - 7 x weekly - 1 sets - 3 reps - 10 sec hold - Standing Tandem Balance with Counter Support  - 1 x daily - 7 x weekly - 1 sets - 3 reps - 10 sec hold - Long Sitting Calf Stretch with Strap  - 1 x daily - 7 x weekly - 1 sets - 5 reps - 20" hold  ASSESSMENT:  CLINICAL IMPRESSION:  Pt admits to not doing his exercises due to being busy with his sick mother.  Reviewed HEP and instructed importance of doing these.  No new exercises added to HEP today as needs to get independent with primary ones first.  Progressed LE and ankle strengthening exercises today without complaints of pain or issues.  Patient will continue to benefit from skilled physical therapy services to address deficits, reduce pain and improve level of function with ADLs and functional mobility tasks.  OBJECTIVE IMPAIRMENTS: Abnormal gait, decreased activity tolerance, decreased balance, decreased coordination, decreased endurance, decreased mobility,  difficulty walking, decreased ROM, decreased strength, hypomobility, increased fascial restrictions, impaired perceived functional ability, impaired flexibility, impaired sensation, and pain.   ACTIVITY LIMITATIONS: carrying, lifting, bending, sitting, standing, squatting, sleeping, stairs, transfers, dressing, locomotion level, and caring for others  PARTICIPATION LIMITATIONS: meal prep, cleaning, laundry, shopping, community activity, and occupation  REHAB POTENTIAL: Good  CLINICAL DECISION MAKING: Stable/uncomplicated  EVALUATION COMPLEXITY: Low   GOALS: Goals reviewed with patient? No  SHORT TERM GOALS: Target date: 11/14/22 Patient will be independent with initial HEP  Baseline: Goal status: IN PROGRESS  2.   Patient will report at least 50% improvement in overall symptoms and/or function to demonstrate improved functional mobility  Baseline:  Goal status: IN PROGRESS   LONG TERM GOALS: Target date: 11/28/2022  Patient will be independent in self management strategies to improve quality of life and functional outcomes.   Baseline:  Goal status: IN PROGRESS  2.   Patient will report at least 75% improvement in overall symptoms and/or function to demonstrate improved functional mobility  Baseline:  Goal status: IN PROGRESS  3.  Patient will improve FOTO score to predicted value  Baseline:  Goal status: IN PROGRESS  4.  Patient will improve TUG score to 9 sec or less to demonstrate improved functional mobility and decreased fall risk Baseline: 10.53 Goal status: IN PROGRESS  5.  Patient will improve SLS each leg to 10" to demonstrate improved standing balance Baseline: 5" each leg Goal status: IN PROGRESS   PLAN:  PT FREQUENCY: 2x/week  PT DURATION: 4 weeks  Planned Interventions:Therapeutic exercises, Therapeutic activity, Neuromuscular re-education, Balance training, Gait training, Patient/Family education,  Joint manipulation, Joint mobilization, Stair  training, Orthotic/Fit training, DME instructions, Aquatic Therapy, Dry Needling, Electrical stimulation, Spinal manipulation, Spinal mobilization, Cryotherapy, Moist heat, Compression bandaging, scar mobilization, Splintting, Taping, Traction, Ultrasound, Ionotophoresis '4mg'$ /ml Dexamethasone, and Manual therapy  PLAN FOR NEXT SESSION: continue to progress LE strength and stability to reduce pain and improve function.  3:22 PM, 11/09/22 Teena Irani, PTA/CLT [place] Ph: [phone]

## 2022-11-11 ENCOUNTER — Other Ambulatory Visit: Payer: Self-pay | Admitting: Family Medicine

## 2022-11-13 ENCOUNTER — Encounter (HOSPITAL_COMMUNITY): Payer: Medicare Other | Admitting: Physical Therapy

## 2022-11-13 NOTE — Telephone Encounter (Signed)
Urgent Care Patient Requested Prescriptions  Pending Prescriptions Disp Refills   Azelastine HCl 137 MCG/SPRAY SOLN [Pharmacy Med Name: AZELASTINE 0.1% (137 MCG) SPRY]  1    Sig: PLACE 1 SPRAY INTO BOTH NOSTRILS 2 (TWO) TIMES DAILY. USE IN EACH NOSTRIL AS DIRECTED     There is no refill protocol information for this order

## 2022-11-14 ENCOUNTER — Other Ambulatory Visit: Payer: Self-pay | Admitting: Family Medicine

## 2022-11-14 NOTE — Telephone Encounter (Signed)
Urgent Care Patient Requested Prescriptions  Pending Prescriptions Disp Refills   VENTOLIN HFA 108 (90 Base) MCG/ACT inhaler [Pharmacy Med Name: VENTOLIN HFA 90 MCG INHALER] 18 each     Sig: INHALE 2 PUFFS INTO THE LUNGS EVERY 4 HOURS AS NEEDED FOR WHEEZING OR SHORTNESS OF BREATH.     There is no refill protocol information for this order

## 2022-11-15 ENCOUNTER — Encounter (HOSPITAL_COMMUNITY): Payer: Medicare Other | Admitting: Physical Therapy

## 2022-11-15 ENCOUNTER — Telehealth (HOSPITAL_COMMUNITY): Payer: Self-pay | Admitting: Physical Therapy

## 2022-11-15 ENCOUNTER — Other Ambulatory Visit: Payer: Self-pay | Admitting: Family Medicine

## 2022-11-15 NOTE — Telephone Encounter (Signed)
Pt did not show for appt. Unable to reach by phone.  Yuliya Nova B Hamdan Toscano, PTA/CLT [facility] Ph: [phone]  

## 2022-11-22 ENCOUNTER — Ambulatory Visit (HOSPITAL_COMMUNITY): Payer: Medicare Other

## 2022-11-22 DIAGNOSIS — M722 Plantar fascial fibromatosis: Secondary | ICD-10-CM

## 2022-11-22 DIAGNOSIS — R262 Difficulty in walking, not elsewhere classified: Secondary | ICD-10-CM

## 2022-11-22 NOTE — Therapy (Signed)
OUTPATIENT PHYSICAL THERAPY Treatment   Patient Name: Bruce Weber. MRN: [id number] DOB:[birth year], 71 y.o., male Today's Date: 11/22/2022  END OF SESSION:  PT End of Session - 11/22/22 0815     Visit Number 3    Number of Visits 8    Date for PT Re-Evaluation 11/28/22    Authorization Type [employer] Medicare    Progress Note Due on Visit 10    PT Start Time 0815    PT Stop Time 0855    PT Time Calculation (min) 40 min    Activity Tolerance Patient tolerated treatment well             Past Medical History:  Diagnosis Date   Adenoma 11/03/2009   1.1 cm from TCS   DM (diabetes mellitus) (HCC)    GERD (gastroesophageal reflux disease)    High cholesterol    History of kidney stones    HTN (hypertension)    Kidney stones    Obesity (BMI 30-39.9) DEC 2011 223 LBS   Rectal bleeding 12/04/2009   secondary to hemorrhoids   Past Surgical History:  Procedure Laterality Date   CIRCUMCISION     COLONOSCOPY  Dec 2010 [facility]   MOD IH, SIMPLE ADENOMA 1.1 CM   COLONOSCOPY  11/25/2012   SLF: 1. Sessile polyp measuring 2m in size was found at the hepatic flesure; polypectomy was performed using snare cautery 2. Moderate diverticulosis was noted in the ascending colon and sigmoid colon. 3. The colon mucosa was otherwise normal 4. Large internal hemorrhoids.    COLONOSCOPY N/A 03/01/2017   Procedure: COLONOSCOPY;  Surgeon: SDanie Binder MD;  [facility];  Service: Endoscopy;  Laterality: N/A;  10:30 AM   COLONOSCOPY WITH PROPOFOL N/A 04/14/2022   Procedure: COLONOSCOPY WITH PROPOFOL;  Surgeon: CEloise Harman DO;  [facility];  Service: Endoscopy;  Laterality: N/A;  11:00am   EYE SURGERY  05/2018   bilateral cataract surgery   HERNIA REPAIR     POLYPECTOMY  04/14/2022   Procedure: POLYPECTOMY;  Surgeon: CEloise Harman DO;  [facility];  Service: Endoscopy;;   Patient Active Problem List   Diagnosis Date Noted   Essential hypertension  09/07/2020   Diet-controlled diabetes mellitus ([place] 09/07/2020   CKD (chronic kidney disease) stage 4, GFR 15-29 ml/min (HCC) 09/07/2020   SOB (shortness of breath) on exertion 09/07/2020   Foraminal stenosis of lumbar region 05/05/2020   Colon adenomas    Insomnia 01/11/2016   Internal hemorrhoids with complication 002/54/2706  Obstructive sleep apnea 07/27/2013   Seasonal and perennial allergic rhinitis 07/27/2013   Hx of colonic polyps 11/15/2012   UNSPECIFIED DISEASE OF THE JAWS 03/11/2010   COLONIC POLYPS, ADENOMATOUS 06/30/2009   HYPERLIPIDEMIA 10/13/2008   GLOBUS HYSTERICUS 10/13/2008   GASTROESOPHAGEAL REFLUX DISEASE 10/13/2008   RENAL CALCULUS 10/13/2008   Dysphagia, idiopathic 10/13/2008   ABDOMINAL PAIN 10/13/2008   H N P-LUMBAR 06/23/2008   SPINAL STENOSIS 06/23/2008   Chronic midline low back pain without sciatica 06/23/2008    PCP: SBillie Ruddy MD  REFERRING PROVIDER: MTrula Slade DPM  REFERRING DIAG: [id number](ICD-10-CM) - Gait instability M72.2 (ICD-10-CM) - Plantar fasciitis  THERAPY DIAG:  Difficulty in walking, not elsewhere classified  Plantar fasciitis of right foot  Rationale for Evaluation and Treatment: Rehabilitation  ONSET DATE: Feb 2022   SUBJECTIVE:   SUBJECTIVE STATEMENT: Some left toe pain today; has tried to do exercises some but still assisting  with his mother's care.  Evaluation: Fractured a bone in right foot last feb; fell down bleachers; 2 days later went and got an x-[place].  Dr. Jacqualyn Posey treated him for that.  Walks "like a drunk"; thinks this is a combination of his foot and his back; tried orthotics in shoe; didn't help much.   PERTINENT HISTORY: History of 6 herniated discs Takes care of his 72 yo mother Hx of 2 injections in his back; the latest about 9 months  PAIN:  Are you having pain? Yes: NPRS scale: 4/10 Pain location: left big toe Pain description: raw Aggravating factors: standing, walking Relieving  factors: rest  PRECAUTIONS: None  WEIGHT BEARING RESTRICTIONS: No  FALLS:  Has patient fallen in last 6 months? No  LIVING ENVIRONMENT: Lives with: lives with their spouse Lives in: House/apartment Stairs: Yes: Internal: 8 steps; on left going up and External: 3 steps; on right going up, on left going up, and can reach both Has following equipment at home: Crutches  OCCUPATION: works behind a Teaching laboratory technician  PLOF: Independent  PATIENT GOALS: build up some endurance; walk for longer periods of time; walk for fitness  NEXT MD VISIT:   OBJECTIVE:   DIAGNOSTIC FINDINGS: multiple x-rays  PATIENT SURVEYS:  LEFS 38/80; 47.5%  COGNITION: Overall cognitive status: Within functional limits for tasks assessed     SENSATION: Occassionally right foot   POSTURE: rounded shoulders and forward head  PALPATION: Tenderness right plantar fascia; palpable nodules in fascia  LOWER EXTREMITY ROM:  Active ROM Right eval Left eval  Hip flexion    Hip extension    Hip abduction    Hip adduction    Hip internal rotation    Hip external rotation    Knee flexion    Knee extension    Ankle dorsiflexion 12 20  Ankle plantarflexion 40 46  Ankle inversion    Ankle eversion     (Blank rows = not tested)  LOWER EXTREMITY MMT:  MMT Right eval Left eval  Hip flexion 4+ 5  Hip extension    Hip abduction    Hip adduction    Hip internal rotation    Hip external rotation    Knee flexion    Knee extension 4+ 5  Ankle dorsiflexion 4+ 5  Ankle plantarflexion    Ankle inversion    Ankle eversion     (Blank rows = not tested)  FUNCTIONAL TESTS:  5 times sit to stand: 9.30 sec no UE assist; tends to lean back on heels Timed up and go (TUG): 10.53 sec SLS 5" each foot   GAIT: Distance walked: 50 ft Assistive device utilized: none Level of assistance: SBA Comments: occassionally takes a misstep   TODAY'S TREATMENT:                                                                                                                               DATE:  11/22/22 Nustep seat 8  x 5' dynamic warm up  Standing: Heel/toe raises x 20 Slant board 5 x 20" Tandem stance 2 x 30" each Hip vectors 5" x 5 Squats to target chair x 10 Rocker board F/B and S/S x 10 each Tandem walking on 15' line Sit to stand from chair x 10 no UE assist   11/09/22 Goal review and HEP Standing: SLS 5X max of each without UE Lt:5", Rt:8"  Tandem stance each LE lead 30"X 2 each  Slant board stretch 3X30"  Plantar fascia stretch on 6" box 2X30" each  Heelraises 20X  Toeraises 20X  Vectors 5X5" with 1 UE assist  Seated:  sit to stand no UE 10X from standard chair  Gastroc long sitting with towel (HEP review)   10/31/22 physical therapy evaluation and HEP instruction   Patient education: Education details: Patient educated on exam findings, POC, scope of PT, HEP. Person educated: Patient Education method: Explanation, Demonstration, and Handouts Education comprehension: verbalized understanding, returned demonstration, verbal cues required, and tactile cues required   HOME EXERCISE PROGRAM: Access Code: [email or web address] URL: [email or web address] Date: 10/31/2022 Prepared by: [facility] - [facility]  Exercises - Standing Single Leg Stance with Counter Support  - 1 x daily - 7 x weekly - 1 sets - 3 reps - 10 sec hold - Standing Tandem Balance with Counter Support  - 1 x daily - 7 x weekly - 1 sets - 3 reps - 10 sec hold - Long Sitting Calf Stretch with Strap  - 1 x daily - 7 x weekly - 1 sets - 5 reps - 20" hold  ASSESSMENT:  CLINICAL IMPRESSION:  Today's session continued to focus on lower extremity strengthening and balance.  Patient still with good challenge with tandem stance.  Added rocker board for increased balance challenge.   Patient will continue to benefit from skilled physical therapy services to address deficits, reduce pain and improve level of function with ADLs and functional  mobility tasks.  OBJECTIVE IMPAIRMENTS: Abnormal gait, decreased activity tolerance, decreased balance, decreased coordination, decreased endurance, decreased mobility, difficulty walking, decreased ROM, decreased strength, hypomobility, increased fascial restrictions, impaired perceived functional ability, impaired flexibility, impaired sensation, and pain.   ACTIVITY LIMITATIONS: carrying, lifting, bending, sitting, standing, squatting, sleeping, stairs, transfers, dressing, locomotion level, and caring for others  PARTICIPATION LIMITATIONS: meal prep, cleaning, laundry, shopping, community activity, and occupation  REHAB POTENTIAL: Good  CLINICAL DECISION MAKING: Stable/uncomplicated  EVALUATION COMPLEXITY: Low   GOALS: Goals reviewed with patient? No  SHORT TERM GOALS: Target date: 11/14/22 Patient will be independent with initial HEP  Baseline: Goal status: IN PROGRESS  2.   Patient will report at least 50% improvement in overall symptoms and/or function to demonstrate improved functional mobility  Baseline:  Goal status: IN PROGRESS   LONG TERM GOALS: Target date: 11/28/2022  Patient will be independent in self management strategies to improve quality of life and functional outcomes.   Baseline:  Goal status: IN PROGRESS  2.   Patient will report at least 75% improvement in overall symptoms and/or function to demonstrate improved functional mobility  Baseline:  Goal status: IN PROGRESS  3.  Patient will improve FOTO score to predicted value  Baseline:  Goal status: IN PROGRESS  4.  Patient will improve TUG score to 9 sec or less to demonstrate improved functional mobility and decreased fall risk Baseline: 10.53 Goal status: IN PROGRESS  5.  Patient will improve SLS each leg to 10" to demonstrate improved standing balance Baseline: 5" each leg  Goal status: IN PROGRESS   PLAN:  PT FREQUENCY: 2x/week  PT DURATION: 4 weeks  Planned  Interventions:Therapeutic exercises, Therapeutic activity, Neuromuscular re-education, Balance training, Gait training, Patient/Family education, Joint manipulation, Joint mobilization, Stair training, Orthotic/Fit training, DME instructions, Aquatic Therapy, Dry Needling, Electrical stimulation, Spinal manipulation, Spinal mobilization, Cryotherapy, Moist heat, Compression bandaging, scar mobilization, Splintting, Taping, Traction, Ultrasound, Ionotophoresis '4mg'$ /ml Dexamethasone, and Manual therapy  PLAN FOR NEXT SESSION: continue to progress LE strength and stability to reduce pain and improve function.  8:56 AM, 11/22/22 Jakie Debow Small Guadalupe Nickless MPT [facility] [place] [phone]

## 2022-11-23 ENCOUNTER — Ambulatory Visit (INDEPENDENT_AMBULATORY_CARE_PROVIDER_SITE_OTHER): Payer: Medicare Other | Admitting: Family Medicine

## 2022-11-23 ENCOUNTER — Encounter: Payer: Self-pay | Admitting: Family Medicine

## 2022-11-23 VITALS — BP 146/80 | HR 60 | Temp 97.9°F | Wt 207.0 lb

## 2022-11-23 DIAGNOSIS — H6991 Unspecified Eustachian tube disorder, right ear: Secondary | ICD-10-CM

## 2022-11-23 DIAGNOSIS — E119 Type 2 diabetes mellitus without complications: Secondary | ICD-10-CM

## 2022-11-23 DIAGNOSIS — L97521 Non-pressure chronic ulcer of other part of left foot limited to breakdown of skin: Secondary | ICD-10-CM | POA: Diagnosis not present

## 2022-11-23 DIAGNOSIS — E1122 Type 2 diabetes mellitus with diabetic chronic kidney disease: Secondary | ICD-10-CM

## 2022-11-23 DIAGNOSIS — N184 Chronic kidney disease, stage 4 (severe): Secondary | ICD-10-CM | POA: Diagnosis not present

## 2022-11-23 DIAGNOSIS — R052 Subacute cough: Secondary | ICD-10-CM

## 2022-11-23 DIAGNOSIS — L03032 Cellulitis of left toe: Secondary | ICD-10-CM

## 2022-11-23 DIAGNOSIS — R0982 Postnasal drip: Secondary | ICD-10-CM

## 2022-11-23 LAB — POCT GLYCOSYLATED HEMOGLOBIN (HGB A1C): Hemoglobin A1C: 5.3 % (ref 4.0–5.6)

## 2022-11-23 MED ORDER — AMOXICILLIN 500 MG PO TABS: 500.0000 mg | ORAL_TABLET | Freq: Two times a day (BID) | ORAL | 0 refills | Status: AC

## 2022-11-23 MED ORDER — FEXOFENADINE HCL 60 MG PO TABS: 60.0000 mg | ORAL_TABLET | Freq: Every day | ORAL | 1 refills | Status: DC

## 2022-11-23 NOTE — Progress Notes (Signed)
Subjective:    Patient ID: Bruce Lodge., male    DOB: 1951/10/27, 71 y.o.   MRN: [id number]  Chief Complaint  Patient presents with   Foot Pain    Pt c/o foot pain. Happen 3-4 days ago. Used neosporin, skin peeled off on L big toe.    Cough    Pt reports coughing- clear/white phlegm. 2 wks ago. With runny nose. Denied fever, bodyaches, fatigue.     HPI Patient was seen today for f/u and acute concerns.  Patient has noticed blood sugar increasing.  Recently restarted checking BS in AM.  Was 219 this morning.  Previously diet controlled.  No changes in diet.  Patient followed by nephrology for CKD 4 which is stable.  Patient is on renal transplant list.  Patient endorses cough x 1 month.  Seen at [facility], Dx with URI.  Given inhaler, nasal spray and medication for symptoms.  Patient noticed improvement then return of symptoms 2-3 weeks ago with cough and rhinorrhea.  Also with postnasal drainage and right-sided neck discomfort.  Tried Mucinex and cough syrup.   Pt with sore L great toe x 4 days.  States skin initially appeared wet and was peeling.  Now with open area on toe.  Applied Neosporin.  Denies fever, chills, nausea, vomiting, drainage from wound.  Typically wears sneakers.  Was not sure if she was rubbing on toe.  Patient does not remember if he took his Norvasc last night as he may have gotten mixed up with his mother-in-law's medication.  Past Medical History:  Diagnosis Date   Adenoma 11/03/2009   1.1 cm from TCS   DM (diabetes mellitus) (HCC)    GERD (gastroesophageal reflux disease)    High cholesterol    History of kidney stones    HTN (hypertension)    Kidney stones    Obesity (BMI 30-39.9) DEC 2011 223 LBS   Rectal bleeding 12/04/2009   secondary to hemorrhoids    Allergies  Allergen Reactions   Dye Fdc Blue [Brilliant Blue Fcf (Fd&C Blue #1)]     PT UNSURE WHICH DYE   Iodinated Contrast Media Hives    30 YEARS AGO, recent 2021 epidural with contrast ok     ROS General: Denies fever, chills, night sweats, changes in weight, changes in appetite + elevated blood sugar HEENT: Denies headaches, ear pain, changes in vision, rhinorrhea, sore throat CV: Denies CP, palpitations, SOB, orthopnea Pulm: Denies SOB, cough, wheezing GI: Denies abdominal pain, nausea, vomiting, diarrhea, constipation GU: Denies dysuria, hematuria, frequency Msk: Denies muscle cramps, joint pains Neuro: Denies weakness, numbness, tingling Skin: Denies rashes, bruising + sore on left great toe Psych: Denies depression, anxiety, hallucinations     Objective:    Blood pressure (!) 146/80, pulse 60, temperature 97.9 F (36.6 C), temperature source Oral, weight 207 lb (93.9 kg), SpO2 99 %.  Gen. Pleasant, well-nourished, in no distress, normal affect   HEENT: [place]/AT, face symmetric, conjunctiva clear, no scleral icterus, PERRLA, EOMI, nares patent without drainage, pharynx without erythema or exudate. Lungs: no accessory muscle use, CTAB, no wheezes or rales Cardiovascular: RRR, no m/r/g, no peripheral edema Neuro:  A&Ox3, CN II-XII intact, normal gait Skin:  Warm, dry.  Left great toe with ulceration on plantar surface, granulation tissue noted, good color, surrounding tissue or to touch with mild erythema.  DP and PT pulses 2+.  Area dressed with sterile nonstick dressing and gauze.   Wt Readings from Last 3 Encounters:  11/23/22 207  lb (93.9 kg)  08/09/22 217 lb 11.2 oz (98.7 kg)  07/27/22 211 lb 12.8 oz (96.1 kg)    Lab Results  Component Value Date   WBC 10.1 05/18/2022   HGB 11.1 (A) 05/18/2022   HCT 35 (A) 10/05/2021   PLT 196 10/05/2021   GLUCOSE 110 (H) 04/12/2022   CHOL 166 08/24/2021   TRIG 65.0 08/24/2021   HDL 56.20 08/24/2021   LDLCALC 97 08/24/2021   ALT 12 10/05/2021   AST 13 (A) 10/05/2021   NA 135 (A) 05/18/2022   K 4.4 05/18/2022   CL 113 (H) 04/12/2022   CREATININE 5.0 (A) 05/18/2022   BUN 47 (A) 05/18/2022   CO2 23 (A) 05/18/2022    TSH 2.03 08/24/2021   PSA 1.10 04/15/2021   HGBA1C 5.3 11/23/2022    Assessment/Plan:  Ulcer of left foot, limited to breakdown of skin ([place]) - Plan: amoxicillin (AMOXIL) 500 MG tablet, Ambulatory referral to Podiatry  Type 2 diabetes mellitus with stage 4 chronic kidney disease, without long-term current use of insulin ([place]) - Plan: Ambulatory referral to Podiatry  Diet-controlled diabetes mellitus ([place]) - Plan: POC HgB A1c  Dysfunction of right eustachian tube - Plan: fexofenadine (ALLEGRA ALLERGY) 60 MG tablet  Post-nasal drainage - Plan: fexofenadine (ALLEGRA ALLERGY) 60 MG tablet  Subacute cough - Plan: fexofenadine (ALLEGRA ALLERGY) 60 MG tablet  Cellulitis of toe of left foot - Plan: amoxicillin (AMOXIL) 500 MG tablet, Ambulatory referral to Podiatry  Start amoxicillin for left foot ulcer with cellulitis.  Discussed keeping area clean and dry.  Will place referral to podiatry to further monitor and diabetic shoes.  Blood sugar elevated at home in the 200s and hemoglobin A1c 5.3% this visit.  Difference likely 2/2 progressing CKD stage IV.  Patient advised blood sugar likely to remain more elevated at home.  Discussed monitoring for the next week.  For continued elevation start medication such as GLP-1 for cardiovascular benefits.  Will need to renally dose medication.  Avoid metformin.  Allergies likely causing postnasal drainage, cough, eustachian tube dysfunction.  Allegra 60 mg daily as needed.  Also consider Flonase or saline nasal rinse if additional relief needed.  Given strict precautions  F/u as needed in the next 2-4weeks  Grier Mitts, MD

## 2022-11-23 NOTE — Patient Instructions (Addendum)
Your hgb A1C was 5.3 %.  A prescription for Allegra 60 mg (allergy medicine) and Amoxicillin 500 mg (antibiotic) were sent to your pharmacy.  A referral to the podiatrist was also placed.  You should expect a phone call about this.

## 2022-11-29 ENCOUNTER — Ambulatory Visit (HOSPITAL_COMMUNITY): Payer: Medicare Other

## 2022-11-29 DIAGNOSIS — R262 Difficulty in walking, not elsewhere classified: Secondary | ICD-10-CM

## 2022-11-29 DIAGNOSIS — M722 Plantar fascial fibromatosis: Secondary | ICD-10-CM

## 2022-11-29 NOTE — Therapy (Addendum)
OUTPATIENT PHYSICAL THERAPY PROGRESS NOTE Progress Note Reporting Period 10/31/2022 to 11/29/22  See note below for Objective Data and Assessment of Progress/Goals.       Patient Name: Bruce Weber. MRN: [id number] DOB:1951-03-01, 71 y.o., male Today's Date: 12/05/2022  END OF SESSION:    11/29/22 1301  PT Visits / Re-Eval  Visit Number 4  Number of Visits 8  Date for PT Re-Evaluation 12/22/22  Authorization  Authorization Type [employer]  Progress Note Due on Visit 10  PT Time Calculation  PT Start Time 1301  PT Stop Time 1340  PT Time Calculation (min) 39 min  PT - End of Session  Activity Tolerance Patient tolerated treatment well  Behavior During Therapy WFL for tasks assessed/performed    Past Medical History:  Diagnosis Date   Adenoma 11/03/2009   1.1 cm from TCS   DM (diabetes mellitus) (HCC)    GERD (gastroesophageal reflux disease)    High cholesterol    History of kidney stones    HTN (hypertension)    Kidney stones    Obesity (BMI 30-39.9) DEC 2011 223 LBS   Rectal bleeding 12/04/2009   secondary to hemorrhoids   Past Surgical History:  Procedure Laterality Date   CIRCUMCISION     COLONOSCOPY  Dec 2010 [facility]   MOD IH, SIMPLE ADENOMA 1.1 CM   COLONOSCOPY  11/25/2012   SLF: 1. Sessile polyp measuring 14m in size was found at the hepatic flesure; polypectomy was performed using snare cautery 2. Moderate diverticulosis was noted in the ascending colon and sigmoid colon. 3. The colon mucosa was otherwise normal 4. Large internal hemorrhoids.    COLONOSCOPY N/A 03/01/2017   Procedure: COLONOSCOPY;  Surgeon: SDanie Binder MD;  [facility]: [facility];  Service: Endoscopy;  Laterality: N/A;  10:30 AM   COLONOSCOPY WITH PROPOFOL N/A 04/14/2022   Procedure: COLONOSCOPY WITH PROPOFOL;  Surgeon: CEloise Harman DO;  Location: [facility];  Service: Endoscopy;  Laterality: N/A;  11:00am   EYE SURGERY  05/2018   bilateral cataract surgery   HERNIA  REPAIR     POLYPECTOMY  04/14/2022   Procedure: POLYPECTOMY;  Surgeon: CEloise Harman DO;  Location: [facility];  Service: Endoscopy;;   Patient Active Problem List   Diagnosis Date Noted   Essential hypertension 09/07/2020   Diet-controlled diabetes mellitus ([place] 09/07/2020   CKD (chronic kidney disease) stage 4, GFR 15-29 ml/min (HCC) 09/07/2020   SOB (shortness of breath) on exertion 09/07/2020   Foraminal stenosis of lumbar region 05/05/2020   Colon adenomas    Insomnia 01/11/2016   Internal hemorrhoids with complication 086/57/8469  Obstructive sleep apnea 07/27/2013   Seasonal and perennial allergic rhinitis 07/27/2013   Hx of colonic polyps 11/15/2012   UNSPECIFIED DISEASE OF THE JAWS 03/11/2010   COLONIC POLYPS, ADENOMATOUS 06/30/2009   HYPERLIPIDEMIA 10/13/2008   GLOBUS HYSTERICUS 10/13/2008   GASTROESOPHAGEAL REFLUX DISEASE 10/13/2008   RENAL CALCULUS 10/13/2008   Dysphagia, idiopathic 10/13/2008   ABDOMINAL PAIN 10/13/2008   H N P-LUMBAR 06/23/2008   SPINAL STENOSIS 06/23/2008   Chronic midline low back pain without sciatica 06/23/2008    PCP: SBillie Ruddy MD  REFERRING PROVIDER: MTrula Slade DPM  REFERRING DIAG: [id number](ICD-10-CM) - Gait instability M72.2 (ICD-10-CM) - Plantar fasciitis  THERAPY DIAG:  Difficulty in walking, not elsewhere classified  Plantar fasciitis of right foot  Rationale for Evaluation and Treatment: Rehabilitation  ONSET DATE: Feb 2022   SUBJECTIVE:  SUBJECTIVE STATEMENT: Caught a cramp in right front of his leg last night but able to get up and walk around without issue. Patient reports he has not been doing his exercises due to holiday activities.  About "50%" better overall  Evaluation: Fractured a bone in right foot last feb; fell down bleachers; 2 days later went and got an x-Akito.  Dr. Jacqualyn Posey treated him for that.  Walks "like a drunk"; thinks this is a combination of his foot and his back; tried orthotics in  shoe; didn't help much.   PERTINENT HISTORY: History of 6 herniated discs Takes care of his 61 yo mother Hx of 2 injections in his back; the latest about 9 months  PAIN:  Are you having pain? Yes: NPRS scale: 4/10 Pain location: left big toe Pain description: raw Aggravating factors: standing, walking Relieving factors: rest  PRECAUTIONS: None  WEIGHT BEARING RESTRICTIONS: No  FALLS:  Has patient fallen in last 6 months? No  LIVING ENVIRONMENT: Lives with: lives with their spouse Lives in: House/apartment Stairs: Yes: Internal: 8 steps; on left going up and External: 3 steps; on right going up, on left going up, and can reach both Has following equipment at home: Crutches  OCCUPATION: works behind a Teaching laboratory technician  PLOF: Independent  PATIENT GOALS: build up some endurance; walk for longer periods of time; walk for fitness  NEXT MD VISIT:   OBJECTIVE:   DIAGNOSTIC FINDINGS: multiple x-rays  PATIENT SURVEYS:  LEFS 38/80; 47.5%  COGNITION: Overall cognitive status: Within functional limits for tasks assessed     SENSATION: Occassionally right foot   POSTURE: rounded shoulders and forward head  PALPATION: Tenderness right plantar fascia; palpable nodules in fascia  LOWER EXTREMITY ROM:  Active ROM Right eval Left eval  Hip flexion    Hip extension    Hip abduction    Hip adduction    Hip internal rotation    Hip external rotation    Knee flexion    Knee extension    Ankle dorsiflexion 12 20  Ankle plantarflexion 40 46  Ankle inversion    Ankle eversion     (Blank rows = not tested)  LOWER EXTREMITY MMT:  MMT Right eval Left eval Right 11/29/22   Hip flexion 4+ 5 5  Hip extension     Hip abduction     Hip adduction     Hip internal rotation     Hip external rotation     Knee flexion     Knee extension 4+ 5 5  Ankle dorsiflexion 4+ 5 5  Ankle plantarflexion     Ankle inversion     Ankle eversion      (Blank rows = not  tested)  FUNCTIONAL TESTS:  5 times sit to stand: 9.30 sec no UE assist; tends to lean back on heels Timed up and go (TUG): 10.53 sec SLS 5" each foot   GAIT: Distance walked: 50 ft Assistive device utilized: none Level of assistance: SBA Comments: occassionally takes a misstep   TODAY'S TREATMENT:  DATE:  11/29/22 Progress note LEFS 23/80  5 times sit to stand 12.64  Nustep seat 8 x 5' dynamic warm up level 2 dynamic warm up  Standing: Heel/toe raises 2 x 10 Mini squats 2 x 10 Marching x 20 Tandem stance x 20" each Toe taps 4" box x 10 each 4" step ups x 10 each    11/22/22 Nustep seat 8 x 5' dynamic warm up  Standing: Heel/toe raises x 20 Slant board 5 x 20" Tandem stance 2 x 30" each Hip vectors 5" x 5 Squats to target chair x 10 Rocker board F/B and S/S x 10 each Tandem walking on 15' line Sit to stand from chair x 10 no UE assist   11/09/22 Goal review and HEP Standing: SLS 5X max of each without UE Lt:5", Rt:8"  Tandem stance each LE lead 30"X 2 each  Slant board stretch 3X30"  Plantar fascia stretch on 6" box 2X30" each  Heelraises 20X  Toeraises 20X  Vectors 5X5" with 1 UE assist  Seated:  sit to stand no UE 10X from standard chair  Gastroc long sitting with towel (HEP review)   10/31/22 physical therapy evaluation and HEP instruction   Patient education: Education details: Patient educated on exam findings, POC, scope of PT, HEP. Person educated: Patient Education method: Explanation, Demonstration, and Handouts Education comprehension: verbalized understanding, returned demonstration, verbal cues required, and tactile cues required   HOME EXERCISE PROGRAM: Access Code: [email or web address] URL: [email or web address] Date: 10/31/2022 Prepared by: [facility] - Rehab  Exercises - Standing Single Leg Stance with  Counter Support  - 1 x daily - 7 x weekly - 1 sets - 3 reps - 10 sec hold - Standing Tandem Balance with Counter Support  - 1 x daily - 7 x weekly - 1 sets - 3 reps - 10 sec hold - Long Sitting Calf Stretch with Strap  - 1 x daily - 7 x weekly - 1 sets - 5 reps - 20" hold  ASSESSMENT:  CLINICAL IMPRESSION:  Progress note today; STG's met.   Would benefit from extended therapy to address remaining unmet and partially met goals.  Good improvement with strength noted.   Patient will continue to benefit from skilled physical therapy services to address deficits, reduce pain and improve level of function with ADLs and functional mobility tasks.  OBJECTIVE IMPAIRMENTS: Abnormal gait, decreased activity tolerance, decreased balance, decreased coordination, decreased endurance, decreased mobility, difficulty walking, decreased ROM, decreased strength, hypomobility, increased fascial restrictions, impaired perceived functional ability, impaired flexibility, impaired sensation, and pain.   ACTIVITY LIMITATIONS: carrying, lifting, bending, sitting, standing, squatting, sleeping, stairs, transfers, dressing, locomotion level, and caring for others  PARTICIPATION LIMITATIONS: meal prep, cleaning, laundry, shopping, community activity, and occupation  REHAB POTENTIAL: Good  CLINICAL DECISION MAKING: Stable/uncomplicated  EVALUATION COMPLEXITY: Low   GOALS: Goals reviewed with patient? No  SHORT TERM GOALS: Target date: 11/14/22 Patient will be independent with initial HEP  Baseline: Goal status: MET  2.   Patient will report at least 50% improvement in overall symptoms and/or function to demonstrate improved functional mobility  Baseline:  Goal status: MET   LONG TERM GOALS: Target date: 12/22/2021  Patient will be independent in self management strategies to improve quality of life and functional outcomes.   Baseline:  Goal status: IN PROGRESS  2.   Patient will report at least 75%  improvement in overall symptoms and/or function to demonstrate improved functional mobility  Baseline:  Goal status: IN PROGRESS  3.  Patient will improve LEFS score by 5 points to demonstrate improved functional mobility  Baseline:  Goal status: IN PROGRESS  4.  Patient will improve TUG score to 9 sec or less to demonstrate improved functional mobility and decreased fall risk Baseline: 10.53 Goal status: IN PROGRESS  5.  Patient will improve SLS each leg to 10" to demonstrate improved standing balance Baseline: 5" each leg Goal status: IN PROGRESS   PLAN:  PT FREQUENCY: 2x/week  PT DURATION: 4 weeks  Planned Interventions:Therapeutic exercises, Therapeutic activity, Neuromuscular re-education, Balance training, Gait training, Patient/Family education, Joint manipulation, Joint mobilization, Stair training, Orthotic/Fit training, DME instructions, Aquatic Therapy, Dry Needling, Electrical stimulation, Spinal manipulation, Spinal mobilization, Cryotherapy, Moist heat, Compression bandaging, scar mobilization, Splintting, Taping, Traction, Ultrasound, Ionotophoresis 88m/ml Dexamethasone, and Manual therapy  PLAN FOR NEXT SESSION: continue to progress LE strength and stability to reduce pain and improve function.extend 2 week 3   7:11 AM, 12/05/22 Coury Grieger Small Nickie Warwick MPT [facility] [place] [phone]

## 2022-12-05 ENCOUNTER — Other Ambulatory Visit: Payer: Self-pay | Admitting: Gastroenterology

## 2022-12-05 ENCOUNTER — Encounter (HOSPITAL_COMMUNITY): Payer: Medicare Other

## 2022-12-05 DIAGNOSIS — K219 Gastro-esophageal reflux disease without esophagitis: Secondary | ICD-10-CM

## 2022-12-11 ENCOUNTER — Ambulatory Visit
Admission: EM | Admit: 2022-12-11 | Discharge: 2022-12-11 | Disposition: A | Discharge status: Home / Self Care | Payer: Medicare Other | Attending: Family Medicine | Admitting: Family Medicine

## 2022-12-11 ENCOUNTER — Encounter (HOSPITAL_COMMUNITY): Payer: Medicare Other | Admitting: Physical Therapy

## 2022-12-11 DIAGNOSIS — R6883 Chills (without fever): Secondary | ICD-10-CM | POA: Diagnosis present

## 2022-12-11 DIAGNOSIS — J069 Acute upper respiratory infection, unspecified: Secondary | ICD-10-CM | POA: Diagnosis present

## 2022-12-11 DIAGNOSIS — R059 Cough, unspecified: Secondary | ICD-10-CM | POA: Insufficient documentation

## 2022-12-11 DIAGNOSIS — Z1152 Encounter for screening for COVID-19: Secondary | ICD-10-CM | POA: Insufficient documentation

## 2022-12-11 MED ORDER — FLUTICASONE PROPIONATE 50 MCG/ACT NA SUSP: 1.0000 | Freq: Two times a day (BID) | NASAL | 2 refills | Status: DC

## 2022-12-11 MED ORDER — PROMETHAZINE-DM 6.25-15 MG/5ML PO SYRP: 5.0000 mL | ORAL_SOLUTION | Freq: Four times a day (QID) | ORAL | 0 refills | Status: DC | PRN

## 2022-12-11 NOTE — ED Triage Notes (Signed)
Chills, cough, fatigue, runny nose that started about 3 days ago. Home Covid test negative. Taking OTC cough syrup.

## 2022-12-11 NOTE — ED Provider Notes (Signed)
[facility]    CSN: [id number] Arrival date & time: 12/11/22  1525      History   Chief Complaint Chief Complaint  Patient presents with   Cough    HPI Bruce Weber. is a 72 y.o. male.    Presenting today with 3-day history of chills, cough, fatigue, rhinorrhea.  Denies chest pain, shortness of breath, abdominal pain, nausea vomiting or diarrhea.  Trying over-the-counter cough syrup with no relief.  No known history of chronic pulmonary disease.  No known sick contacts recently.  Home COVID test was negative.    Past Medical History:  Diagnosis Date   Adenoma 11/03/2009   1.1 cm from TCS   DM (diabetes mellitus) (HCC)    GERD (gastroesophageal reflux disease)    High cholesterol    History of kidney stones    HTN (hypertension)    Kidney stones    Obesity (BMI 30-39.9) DEC 2011 223 LBS   Rectal bleeding 12/04/2009   secondary to hemorrhoids    Patient Active Problem List   Diagnosis Date Noted   Essential hypertension 09/07/2020   Diet-controlled diabetes mellitus ([place]) 09/07/2020   CKD (chronic kidney disease) stage 4, GFR 15-29 ml/min (HCC) 09/07/2020   SOB (shortness of breath) on exertion 09/07/2020   Foraminal stenosis of lumbar region 05/05/2020   Colon adenomas    Insomnia 01/11/2016   Internal hemorrhoids with complication 40/98/1191   Obstructive sleep apnea 07/27/2013   Seasonal and perennial allergic rhinitis 07/27/2013   Hx of colonic polyps 11/15/2012   UNSPECIFIED DISEASE OF THE JAWS 03/11/2010   COLONIC POLYPS, ADENOMATOUS 06/30/2009   HYPERLIPIDEMIA 10/13/2008   GLOBUS HYSTERICUS 10/13/2008   GASTROESOPHAGEAL REFLUX DISEASE 10/13/2008   RENAL CALCULUS 10/13/2008   Dysphagia, idiopathic 10/13/2008   ABDOMINAL PAIN 10/13/2008   H N P-LUMBAR 06/23/2008   SPINAL STENOSIS 06/23/2008   Chronic midline low back pain without sciatica 06/23/2008    Past Surgical History:  Procedure Laterality Date   CIRCUMCISION     COLONOSCOPY   Dec 2010 [facility]   MOD IH, SIMPLE ADENOMA 1.1 CM   COLONOSCOPY  11/25/2012   SLF: 1. Sessile polyp measuring 25m in size was found at the hepatic flesure; polypectomy was performed using snare cautery 2. Moderate diverticulosis was noted in the ascending colon and sigmoid colon. 3. The colon mucosa was otherwise normal 4. Large internal hemorrhoids.    COLONOSCOPY N/A 03/01/2017   Procedure: COLONOSCOPY;  Surgeon: SDanie Binder MD;  [facility];  Service: Endoscopy;  Laterality: N/A;  10:30 AM   COLONOSCOPY WITH PROPOFOL N/A 04/14/2022   Procedure: COLONOSCOPY WITH PROPOFOL;  Surgeon: CEloise Harman DO;  [facility];  Service: Endoscopy;  Laterality: N/A;  11:00am   EYE SURGERY  05/2018   bilateral cataract surgery   HERNIA REPAIR     POLYPECTOMY  04/14/2022   Procedure: POLYPECTOMY;  Surgeon: CEloise Harman DO;  [facility];  Service: Endoscopy;;       Home Medications    Prior to Admission medications   Medication Sig Start Date End Date Taking? Authorizing Provider  acetaminophen (TYLENOL) 325 MG tablet Take 2 tablets (650 mg total) by mouth every 6 (six) hours as needed for moderate pain. 01/26/22  Yes MJaynee Eagles PA-C  albuterol (VENTOLIN HFA) 108 (90 Base) MCG/ACT inhaler Inhale 2 puffs into the lungs every 4 (four) hours as needed for wheezing or shortness of breath. 10/20/22  Yes LOrene Desanctis  Lilia Argue, PA-C  allopurinol (ZYLOPRIM) 100 MG tablet Take 300 mg by mouth daily.    Yes [provider]  amLODipine (NORVASC) 10 MG tablet TAKE 1 TABLET BY MOUTH EVERYDAY AT BEDTIME 07/20/22  Yes Billie Ruddy, MD  atorvastatin (LIPITOR) 10 MG tablet TAKE 1 TABLET BY MOUTH EVERY DAY (NEED PHYSICAL) 10/19/22  Yes Billie Ruddy, MD  azelastine (ASTELIN) 0.1 % nasal spray Place 1 spray into both nostrils 2 (two) times daily. Use in each nostril as directed 10/20/22  Yes Volney American, PA-C  calcitRIOL (ROCALTROL) 0.25 MCG capsule  Take 0.25 mcg by mouth at bedtime.    Yes [provider]  famotidine (PEPCID) 40 MG tablet TAKE 1 TABLET (40 MG TOTAL) BY MOUTH DAILY. WITH BREAKFAST 12/07/22  Yes Carver, Elon Alas, DO  fexofenadine [facility] ALLERGY) 60 MG tablet Take 1 tablet (60 mg total) by mouth daily. 11/23/22  Yes Billie Ruddy, MD  fluticasone (FLONASE) 50 MCG/ACT nasal spray Place 1 spray into both nostrils 2 (two) times daily. 12/11/22  Yes Volney American, PA-C  furosemide (LASIX) 40 MG tablet Take 40 mg by mouth daily.   Yes [provider]  gabapentin (NEURONTIN) 100 MG capsule TAKE 1 CAPSULE BY MOUTH THREE TIMES A DAY 10/24/22  Yes Mordecai Rasmussen, MD  hydrALAZINE (APRESOLINE) 25 MG tablet Take 25 mg by mouth 3 (three) times daily.   Yes [provider]  promethazine-dextromethorphan (PROMETHAZINE-DM) 6.25-15 MG/5ML syrup Take 5 mLs by mouth 4 (four) times daily as needed. 12/11/22  Yes Volney American, PA-C  propranolol (INDERAL) 80 MG tablet TAKE 1 TABLET BY MOUTH TWICE A DAY 10/16/22  Yes Billie Ruddy, MD  rosuvastatin (CRESTOR) 5 MG tablet Take 5 mg by mouth daily.   Yes [provider]  tamsulosin (FLOMAX) 0.4 MG CAPS capsule Take 0.4 mg by mouth daily. 05/25/20  Yes [provider]  traZODone (DESYREL) 50 MG tablet TAKE 1 TABLET BY MOUTH EVERYDAY AT BEDTIME 05/25/22  Yes Young, Clinton D, MD  clomiPHENE (CLOMID) 50 MG tablet Take 25 mg by mouth at bedtime.     [provider]  famciclovir (FAMVIR) 500 MG tablet Take 1,000 mg by mouth 2 (two) times daily as needed (fever blisters). 02/19/17   [provider]  fluticasone (FLONASE) 50 MCG/ACT nasal spray Place 1 spray into both nostrils daily for 14 days. Patient taking differently: Place 1 spray into both nostrils at bedtime as needed for allergies. 09/29/20 04/05/22  Avegno, Darrelyn Hillock, FNP  cetirizine (ZYRTEC ALLERGY) 10 MG tablet Take 1 tablet (10 mg total) by mouth daily. 09/29/20 09/29/20   AvegnoDarrelyn Hillock, FNP    Family History Family History  Problem Relation Age of Onset   Lung cancer Father    High blood pressure Father    Colon cancer Neg Hx    Colon polyps Neg Hx     Social History Social History   Tobacco Use   Smoking status: Never    Passive exposure: Never   Smokeless tobacco: Never   Tobacco comments:    Never smoked  Vaping Use   Vaping Use: Never used  Substance Use Topics   Alcohol use: No   Drug use: No     Allergies   Dye fdc blue [brilliant blue fcf (fd&c blue #1)] and Iodinated contrast media   Review of Systems Review of Systems PER HPI  Physical Exam Triage Vital Signs ED Triage Vitals  Enc Vitals  Group     BP 12/11/22 1616 125/75     Pulse Rate 12/11/22 1616 (!) 54     Resp 12/11/22 1616 16     Temp 12/11/22 1616 98.2 F (36.8 C)     Temp Source 12/11/22 1616 Oral     SpO2 12/11/22 1616 96 %     Weight --      Height --      Head Circumference --      Peak Flow --      Pain Score 12/11/22 1617 0     Pain Loc --      Pain Edu? --      Excl. in [place]? --    No data found.  Updated Vital Signs BP 125/75 (BP Location: Right Arm)   Pulse (!) 54   Temp 98.2 F (36.8 C) (Oral)   Resp 16   SpO2 96%   Visual Acuity Right Eye Distance:   Left Eye Distance:   Bilateral Distance:    Right Eye Near:   Left Eye Near:    Bilateral Near:     Physical Exam Vitals and nursing note reviewed.  Constitutional:      Appearance: Bruce Weber is well-developed.  HENT:     Head: Atraumatic.     Right Ear: External ear normal.     Left Ear: External ear normal.     Nose: Rhinorrhea present.     Mouth/Throat:     Pharynx: Posterior oropharyngeal erythema present. No oropharyngeal exudate.  Eyes:     Conjunctiva/sclera: Conjunctivae normal.     Pupils: Pupils are equal, round, and reactive to light.  Cardiovascular:     Rate and Rhythm: Normal rate and regular rhythm.  Pulmonary:     Effort: Pulmonary effort is normal. No  respiratory distress.     Breath sounds: No wheezing or rales.  Musculoskeletal:        General: Normal range of motion.     Cervical back: Normal range of motion and neck supple.  Lymphadenopathy:     Cervical: No cervical adenopathy.  Skin:    General: Skin is warm and dry.  Neurological:     Mental Status: Bruce Weber is alert and oriented to person, place, and time.  Psychiatric:        Behavior: Behavior normal.    UC Treatments / Results  Labs (all labs ordered are listed, but only abnormal results are displayed) Labs Reviewed  SARS CORONAVIRUS 2 (TAT 6-24 HRS)   EKG  Radiology No results found.  Procedures Procedures (including critical care time)  Medications Ordered in UC Medications - No data to display  Initial Impression / Assessment and Plan / UC Course  I have reviewed the triage vital signs and the nursing notes.  Pertinent labs & imaging results that were available during my care of the patient were reviewed by me and considered in my medical decision making (see chart for details).     Vital signs and exam reassuring and suggestive of a viral upper respiratory infection.  COVID testing pending, treat with Flonase, Phenergan DM, supportive over-the-counter medications and home care.  Return for worsening symptoms.  Final Clinical Impressions(s) / UC Diagnoses   Final diagnoses:  Viral URI with cough  Chills   Discharge Instructions   None    ED Prescriptions     Medication Sig Dispense Auth. Provider   fluticasone (FLONASE) 50 MCG/ACT nasal spray Place 1 spray into both nostrils 2 (two) times daily.  [place], [place]   promethazine-dextromethorphan (PROMETHAZINE-DM) 6.25-15 MG/5ML syrup Take 5 mLs by mouth 4 (four) times daily as needed. 100 mL Volney American, [place]      PDMP not reviewed this encounter.   Volney American, [place] 12/11/22 1642

## 2022-12-12 LAB — SARS CORONAVIRUS 2 (TAT 6-24 HRS): SARS Coronavirus 2: NEGATIVE

## 2022-12-19 ENCOUNTER — Encounter (HOSPITAL_COMMUNITY): Payer: Self-pay | Admitting: Physical Therapy

## 2022-12-19 ENCOUNTER — Ambulatory Visit (HOSPITAL_COMMUNITY): Payer: Medicare Other | Attending: Podiatry | Admitting: Physical Therapy

## 2022-12-19 DIAGNOSIS — M722 Plantar fascial fibromatosis: Secondary | ICD-10-CM | POA: Diagnosis present

## 2022-12-19 DIAGNOSIS — R262 Difficulty in walking, not elsewhere classified: Secondary | ICD-10-CM

## 2022-12-19 NOTE — Therapy (Signed)
OUTPATIENT PHYSICAL THERAPY TREATMENT       Patient Name: Bruce Weber. MRN: [id number] DOB:12-21-1950, 72 y.o., male Today's Date: 12/19/2022  END OF SESSION:  PT End of Session - 12/19/22 1300     Visit Number 5    Number of Visits 8    Date for PT Re-Evaluation 12/22/22    Authorization Type [employer] Medicare    Progress Note Due on Visit 10    PT Start Time 1300    PT Stop Time 1338    PT Time Calculation (min) 38 min    Activity Tolerance Patient tolerated treatment well    Behavior During Therapy WFL for tasks assessed/performed              Past Medical History:  Diagnosis Date   Adenoma 11/03/2009   1.1 cm from TCS   DM (diabetes mellitus) (HCC)    GERD (gastroesophageal reflux disease)    High cholesterol    History of kidney stones    HTN (hypertension)    Kidney stones    Obesity (BMI 30-39.9) DEC 2011 223 LBS   Rectal bleeding 12/04/2009   secondary to hemorrhoids   Past Surgical History:  Procedure Laterality Date   CIRCUMCISION     COLONOSCOPY  Dec 2010 [facility]   MOD IH, SIMPLE ADENOMA 1.1 CM   COLONOSCOPY  11/25/2012   SLF: 1. Sessile polyp measuring 46m in size was found at the hepatic flesure; polypectomy was performed using snare cautery 2. Moderate diverticulosis was noted in the ascending colon and sigmoid colon. 3. The colon mucosa was otherwise normal 4. Large internal hemorrhoids.    COLONOSCOPY N/A 03/01/2017   Procedure: COLONOSCOPY;  Surgeon: SDanie Binder MD;  [facility];  Service: Endoscopy;  Laterality: N/A;  10:30 AM   COLONOSCOPY WITH PROPOFOL N/A 04/14/2022   Procedure: COLONOSCOPY WITH PROPOFOL;  Surgeon: CEloise Harman DO;  [facility];  Service: Endoscopy;  Laterality: N/A;  11:00am   EYE SURGERY  05/2018   bilateral cataract surgery   HERNIA REPAIR     POLYPECTOMY  04/14/2022   Procedure: POLYPECTOMY;  Surgeon: CEloise Harman DO;  [facility];  Service: Endoscopy;;   Patient  Active Problem List   Diagnosis Date Noted   Essential hypertension 09/07/2020   Diet-controlled diabetes mellitus ([place] 09/07/2020   CKD (chronic kidney disease) stage 4, GFR 15-29 ml/min (HCC) 09/07/2020   SOB (shortness of breath) on exertion 09/07/2020   Foraminal stenosis of lumbar region 05/05/2020   Colon adenomas    Insomnia 01/11/2016   Internal hemorrhoids with complication 089/38/1017  Obstructive sleep apnea 07/27/2013   Seasonal and perennial allergic rhinitis 07/27/2013   Hx of colonic polyps 11/15/2012   UNSPECIFIED DISEASE OF THE JAWS 03/11/2010   COLONIC POLYPS, ADENOMATOUS 06/30/2009   HYPERLIPIDEMIA 10/13/2008   GLOBUS HYSTERICUS 10/13/2008   GASTROESOPHAGEAL REFLUX DISEASE 10/13/2008   RENAL CALCULUS 10/13/2008   Dysphagia, idiopathic 10/13/2008   ABDOMINAL PAIN 10/13/2008   H N P-LUMBAR 06/23/2008   SPINAL STENOSIS 06/23/2008   Chronic midline low back pain without sciatica 06/23/2008    PCP: SBillie Ruddy MD  REFERRING PROVIDER: MTrula Slade DPM  REFERRING DIAG: [phone](ICD-10-CM) - Gait instability M72.2 (ICD-10-CM) - Plantar fasciitis  THERAPY DIAG:  Difficulty in walking, not elsewhere classified  Plantar fasciitis of right foot  Rationale for Evaluation and Treatment: Rehabilitation  ONSET DATE: Feb 2022   SUBJECTIVE:   SUBJECTIVE  STATEMENT: He states he hasn't been moving much lately. Has been doing HEP a little bit  Evaluation: Fractured a bone in right foot last feb; fell down bleachers; 2 days later went and got an x-Stylianos.  Dr. Jacqualyn Posey treated him for that.  Walks "like a drunk"; thinks this is a combination of his foot and his back; tried orthotics in shoe; didn't help much.   PERTINENT HISTORY: History of 6 herniated discs Takes care of his 78 yo mother Hx of 2 injections in his back; the latest about 9 months  PAIN:  Are you having pain? Yes: NPRS scale: 0/10 Pain location: left big toe Pain description: raw Aggravating  factors: standing, walking Relieving factors: rest  PRECAUTIONS: None  WEIGHT BEARING RESTRICTIONS: No  FALLS:  Has patient fallen in last 6 months? No  LIVING ENVIRONMENT: Lives with: lives with their spouse Lives in: House/apartment Stairs: Yes: Internal: 8 steps; on left going up and External: 3 steps; on right going up, on left going up, and can reach both Has following equipment at home: Crutches  OCCUPATION: works behind a Teaching laboratory technician  PLOF: Independent  PATIENT GOALS: build up some endurance; walk for longer periods of time; walk for fitness  NEXT MD VISIT:   OBJECTIVE:   DIAGNOSTIC FINDINGS: multiple x-rays  PATIENT SURVEYS:  LEFS 38/80; 47.5%  COGNITION: Overall cognitive status: Within functional limits for tasks assessed     SENSATION: Occassionally right foot   POSTURE: rounded shoulders and forward head  PALPATION: Tenderness right plantar fascia; palpable nodules in fascia  LOWER EXTREMITY ROM:  Active ROM Right eval Left eval  Hip flexion    Hip extension    Hip abduction    Hip adduction    Hip internal rotation    Hip external rotation    Knee flexion    Knee extension    Ankle dorsiflexion 12 20  Ankle plantarflexion 40 46  Ankle inversion    Ankle eversion     (Blank rows = not tested)  LOWER EXTREMITY MMT:  MMT Right eval Left eval Right 11/29/22   Hip flexion 4+ 5 5  Hip extension     Hip abduction     Hip adduction     Hip internal rotation     Hip external rotation     Knee flexion     Knee extension 4+ 5 5  Ankle dorsiflexion 4+ 5 5  Ankle plantarflexion     Ankle inversion     Ankle eversion      (Blank rows = not tested)  FUNCTIONAL TESTS:  5 times sit to stand: 9.30 sec no UE assist; tends to lean back on heels Timed up and go (TUG): 10.53 sec SLS 5" each foot   GAIT: Distance walked: 50 ft Assistive device utilized: none Level of assistance: SBA Comments: occassionally takes a misstep   TODAY'S  TREATMENT:  DATE:  12/19/22 Nustep seat 9 x 5' dynamic warm up level 3 dynamic warm up HR 1 x 20  Step taps 7 inch steps 2x 10 bilateral  Calf stretch 3x 30 second holds Step up 7 inch 2x 10 bilateral  SLS with vectors 5 x 5 second holds bilateral  STS 1 x 10   11/29/22 Progress note LEFS 23/80  5 times sit to stand 12.64  Nustep seat 8 x 5' dynamic warm up level 2 dynamic warm up  Standing: Heel/toe raises 2 x 10 Mini squats 2 x 10 Marching x 20 Tandem stance x 20" each Toe taps 4" box x 10 each 4" step ups x 10 each    11/22/22 Nustep seat 8 x 5' dynamic warm up  Standing: Heel/toe raises x 20 Slant board 5 x 20" Tandem stance 2 x 30" each Hip vectors 5" x 5 Squats to target chair x 10 Rocker board F/B and S/S x 10 each Tandem walking on 15' line Sit to stand from chair x 10 no UE assist    Patient education: Education details: Patient educated on exam findings, POC, scope of PT, HEP. Person educated: Patient Education method: Explanation, Demonstration, and Handouts Education comprehension: verbalized understanding, returned demonstration, verbal cues required, and tactile cues required   HOME EXERCISE PROGRAM: Access Code: [email or web address] URL: [email or web address] Date: 10/31/2022 Prepared by: [facility]  Exercises - Standing Single Leg Stance with Counter Support  - 1 x daily - 7 x weekly - 1 sets - 3 reps - 10 sec hold - Standing Tandem Balance with Counter Support  - 1 x daily - 7 x weekly - 1 sets - 3 reps - 10 sec hold - Long Sitting Calf Stretch with Strap  - 1 x daily - 7 x weekly - 1 sets - 5 reps - 20" hold  ASSESSMENT:  CLINICAL IMPRESSION: Began session on nustep for dynamic warm up. Continued with LE strengthening and balance training which is tolerated well. Intermittent short rest breaks for fatigue during  session and moderate fatigue noted at end of session. Patient will continue to benefit from physical therapy in order to improve function and reduce impairment.   OBJECTIVE IMPAIRMENTS: Abnormal gait, decreased activity tolerance, decreased balance, decreased coordination, decreased endurance, decreased mobility, difficulty walking, decreased ROM, decreased strength, hypomobility, increased fascial restrictions, impaired perceived functional ability, impaired flexibility, impaired sensation, and pain.   ACTIVITY LIMITATIONS: carrying, lifting, bending, sitting, standing, squatting, sleeping, stairs, transfers, dressing, locomotion level, and caring for others  PARTICIPATION LIMITATIONS: meal prep, cleaning, laundry, shopping, community activity, and occupation  REHAB POTENTIAL: Good  CLINICAL DECISION MAKING: Stable/uncomplicated  EVALUATION COMPLEXITY: Low   GOALS: Goals reviewed with patient? No  SHORT TERM GOALS: Target date: 11/14/22 Patient will be independent with initial HEP  Baseline: Goal status: MET  2.   Patient will report at least 50% improvement in overall symptoms and/or function to demonstrate improved functional mobility  Baseline:  Goal status: MET   LONG TERM GOALS: Target date: 12/22/2021  Patient will be independent in self management strategies to improve quality of life and functional outcomes.   Baseline:  Goal status: IN PROGRESS  2.   Patient will report at least 75% improvement in overall symptoms and/or function to demonstrate improved functional mobility  Baseline:  Goal status: IN PROGRESS  3.  Patient will improve LEFS score by 5 points to demonstrate improved functional mobility  Baseline:  Goal status: IN PROGRESS  4.  Patient will  improve TUG score to 9 sec or less to demonstrate improved functional mobility and decreased fall risk Baseline: 10.53 Goal status: IN PROGRESS  5.  Patient will improve SLS each leg to 10" to demonstrate  improved standing balance Baseline: 5" each leg Goal status: IN PROGRESS   PLAN:  PT FREQUENCY: 2x/week  PT DURATION: 4 weeks  Planned Interventions:Therapeutic exercises, Therapeutic activity, Neuromuscular re-education, Balance training, Gait training, Patient/Family education, Joint manipulation, Joint mobilization, Stair training, Orthotic/Fit training, DME instructions, Aquatic Therapy, Dry Needling, Electrical stimulation, Spinal manipulation, Spinal mobilization, Cryotherapy, Moist heat, Compression bandaging, scar mobilization, Splintting, Taping, Traction, Ultrasound, Ionotophoresis '4mg'$ /ml Dexamethasone, and Manual therapy  PLAN FOR NEXT SESSION: continue to progress LE strength and stability to reduce pain and improve function. Reassess with probable recert needed  0:26 PM, 12/19/22 Mearl Latin PT, DPT Physical Therapist at [facility]

## 2022-12-25 ENCOUNTER — Ambulatory Visit (HOSPITAL_COMMUNITY): Payer: Medicare Other

## 2022-12-25 DIAGNOSIS — M722 Plantar fascial fibromatosis: Secondary | ICD-10-CM

## 2022-12-25 DIAGNOSIS — R262 Difficulty in walking, not elsewhere classified: Secondary | ICD-10-CM

## 2022-12-25 NOTE — Therapy (Addendum)
OUTPATIENT PHYSICAL THERAPY TREATMENT/ PROGRESS NOTE  Progress Note Reporting Period 10/31/2022 to 12/25/2022  See note below for Objective Data and Assessment of Progress/Goals.           Patient Name: Bruce Weber. MRN: [id number] DOB:11/06/1951, 72 y.o., male Today's Date: 12/25/2022  END OF SESSION:  PT End of Session - 12/25/22 1033     Visit Number 6    Number of Visits 8    Date for PT Re-Evaluation 12/22/22    Authorization Type [employer] Medicare    Progress Note Due on Visit 10    PT Start Time 1035    PT Stop Time 1115    PT Time Calculation (min) 40 min    Activity Tolerance Patient tolerated treatment well    Behavior During Therapy WFL for tasks assessed/performed              Past Medical History:  Diagnosis Date   Adenoma 11/03/2009   1.1 cm from TCS   DM (diabetes mellitus) (HCC)    GERD (gastroesophageal reflux disease)    High cholesterol    History of kidney stones    HTN (hypertension)    Kidney stones    Obesity (BMI 30-39.9) DEC 2011 223 LBS   Rectal bleeding 12/04/2009   secondary to hemorrhoids   Past Surgical History:  Procedure Laterality Date   CIRCUMCISION     COLONOSCOPY  Dec 2010 [facility]   MOD IH, SIMPLE ADENOMA 1.1 CM   COLONOSCOPY  11/25/2012   SLF: 1. Sessile polyp measuring 79m in size was found at the hepatic flesure; polypectomy was performed using snare cautery 2. Moderate diverticulosis was noted in the ascending colon and sigmoid colon. 3. The colon mucosa was otherwise normal 4. Large internal hemorrhoids.    COLONOSCOPY N/A 03/01/2017   Procedure: COLONOSCOPY;  Surgeon: SDanie Binder MD;  [facility];  Service: Endoscopy;  Laterality: N/A;  10:30 AM   COLONOSCOPY WITH PROPOFOL N/A 04/14/2022   Procedure: COLONOSCOPY WITH PROPOFOL;  Surgeon: CEloise Harman DO;  [facility];  Service: Endoscopy;  Laterality: N/A;  11:00am   EYE SURGERY  05/2018   bilateral cataract surgery   HERNIA REPAIR      POLYPECTOMY  04/14/2022   Procedure: POLYPECTOMY;  Surgeon: CEloise Harman DO;  [facility];  Service: Endoscopy;;   Patient Active Problem List   Diagnosis Date Noted   Essential hypertension 09/07/2020   D[place] 09/07/[place]   CKD (chronic kidney disease) stage 4, GFR 15-29 ml/min (HCC) 09/07/2020   SOB (shortness of breath) on exertion 09/07/2020   Foraminal stenosis of lumbar region 05/05/2020   Colon adenomas    Insomnia 01/11/2016   Internal hemorrhoids with complication 067/11/4579  Obstructive sleep apnea 07/27/2013   Seasonal and perennial allergic rhinitis 07/27/2013   Hx of colonic polyps 11/15/2012   UNSPECIFIED DISEASE OF THE JAWS 03/11/2010   COLONIC POLYPS, ADENOMATOUS 06/30/2009   HYPERLIPIDEMIA 10/13/2008   GLOBUS HYSTERICUS 10/13/2008   GASTROESOPHAGEAL REFLUX DISEASE 10/13/2008   RENAL CALCULUS 10/13/2008   Dysphagia, idiopathic 10/13/2008   ABDOMINAL PAIN 10/13/2008   H N P-LUMBAR 06/23/2008   SPINAL STENOSIS 06/23/2008   Chronic midline low back pain without sciatica 06/23/2008    PCP: SBillie Ruddy MD  REFERRING PROVIDER: MTrula Slade DPM  REFERRING DIAG: [id number](ICD-10-CM) - Gait instability M72.2 (ICD-10-CM) - Plantar fasciitis  THERAPY DIAG:  Plantar fasciitis of right foot  Difficulty in walking, not elsewhere classified  Rationale for Evaluation and Treatment: Rehabilitation  ONSET DATE: Feb 2022   SUBJECTIVE:   SUBJECTIVE STATEMENT: Patient states that he's doing well today. Denies any pain on the ankles/feet. However, patient reports that he's having some back pain = 6/10; Reports he is "75%" better  Evaluation: Fractured a bone in right foot last feb; fell down bleachers; 2 days later went and got an x-[place].  Dr. Jacqualyn Posey treated him for that.  Walks "like a drunk"; thinks this is a combination of his foot and his back; tried orthotics in shoe; didn't help much.   PERTINENT  HISTORY: History of 6 herniated discs Takes care of his 89 yo mother Hx of 2 injections in his back; the latest about 9 months  PAIN:  Are you having pain? Yes: NPRS scale: 6/10 Pain location: left big toe Pain description: raw Aggravating factors: standing, walking Relieving factors: rest  PRECAUTIONS: None  WEIGHT BEARING RESTRICTIONS: No  FALLS:  Has patient fallen in last 6 months? No  LIVING ENVIRONMENT: Lives with: lives with their spouse Lives in: House/apartment Stairs: Yes: Internal: 8 steps; on left going up and External: 3 steps; on right going up, on left going up, and can reach both Has following equipment at home: Crutches  OCCUPATION: works behind a Teaching laboratory technician  PLOF: Independent  PATIENT GOALS: build up some endurance; walk for longer periods of time; walk for fitness  NEXT MD VISIT:   OBJECTIVE:   DIAGNOSTIC FINDINGS: multiple x-rays  PATIENT SURVEYS:  LEFS 38/80; 47.5%  COGNITION: Overall cognitive status: Within functional limits for tasks assessed     SENSATION: Occassionally right foot   POSTURE: rounded shoulders and forward head  PALPATION: Tenderness right plantar fascia; palpable nodules in fascia  LOWER EXTREMITY ROM:  Active ROM Right eval Left eval  Hip flexion    Hip extension    Hip abduction    Hip adduction    Hip internal rotation    Hip external rotation    Knee flexion    Knee extension    Ankle dorsiflexion 12 20  Ankle plantarflexion 40 46  Ankle inversion    Ankle eversion     (Blank rows = not tested)  LOWER EXTREMITY MMT:  MMT Right eval Left eval Right 11/29/22   Hip flexion 4+ 5 5  Hip extension     Hip abduction     Hip adduction     Hip internal rotation     Hip external rotation     Knee flexion     Knee extension 4+ 5 5  Ankle dorsiflexion 4+ 5 5  Ankle plantarflexion     Ankle inversion     Ankle eversion      (Blank rows = not tested)  FUNCTIONAL TESTS:  5 times sit to stand: 9.30  sec no UE assist; tends to lean back on heels Timed up and go (TUG): 10.53 sec SLS 5" each foot   GAIT: Distance walked: 50 ft Assistive device utilized: none Level of assistance: SBA Comments: occassionally takes a misstep   TODAY'S TREATMENT:  DATE:  12/25/22 Progress note SLS Left 4" Right 7" TUG 8.3 sec LEFS 32/80   Nustep seat 9 x 5' dynamic warm up level 4 dynamic warm up HR 1 x 20  Toe raises x 20 Reaching in various direction while standing on foam x 30" on each side Calf slantboard  stretch 3x 30 second holds Step up 8 inch 2x 10 bilateral  SLS with vectors 5 x 5 second holds bilateral  STS 1 x 10 Stepping over 4"  3 hurdles x 5 rounds  12/19/22 Nustep seat 9 x 5' dynamic warm up level 3 dynamic warm up HR 1 x 20  Step taps 7 inch steps 2x 10 bilateral  Calf stretch 3x 30 second holds Step up 7 inch 2x 10 bilateral  SLS with vectors 5 x 5 second holds bilateral  STS 1 x 10   11/29/22 Progress note LEFS 23/80  5 times sit to stand 12.64  Nustep seat 8 x 5' dynamic warm up level 2 dynamic warm up  Standing: Heel/toe raises 2 x 10 Mini squats 2 x 10 Marching x 20 Tandem stance x 20" each Toe taps 4" box x 10 each 4" step ups x 10 each    11/22/22 Nustep seat 8 x 5' dynamic warm up  Standing: Heel/toe raises x 20 Slant board 5 x 20" Tandem stance 2 x 30" each Hip vectors 5" x 5 Squats to target chair x 10 Rocker board F/B and S/S x 10 each Tandem walking on 15' line Sit to stand from chair x 10 no UE assist    Patient education: Education details: Patient educated on exam findings, POC, scope of PT, HEP. Person educated: Patient Education method: Explanation, Demonstration, and Handouts Education comprehension: verbalized understanding, returned demonstration, verbal cues required, and tactile cues required   HOME  EXERCISE PROGRAM: Access Code: [email or web address] URL: [email or web address] Date: 10/31/2022 Prepared by: [facility]  Exercises - Standing Single Leg Stance with Counter Support  - 1 x daily - 7 x weekly - 1 sets - 3 reps - 10 sec hold - Standing Tandem Balance with Counter Support  - 1 x daily - 7 x weekly - 1 sets - 3 reps - 10 sec hold - Long Sitting Calf Stretch with Strap  - 1 x daily - 7 x weekly - 1 sets - 5 reps - 20" hold  ASSESSMENT:  CLINICAL IMPRESSION: Progress note today.  Improvement with TUG and met TUG goal; still some issues with balance noted.  2/2 STG's met and 2/5 LTG's met.  Session focused on LE and balance today. Patient denies any pain on the ankles/feet but reported of some discomfort on the knees when doing sit-to-stand. Demonstrated mild to moderate unsteadiness with SLS while being encouraged not to hold on to the bars due to some proprioceptive deficits. Patient also demonstrated mild loss of balance when stepping over hurdles. Patient will benefit from continued therapy services to address unmet and partially met goals; address deficits and promote optimal function.  OBJECTIVE IMPAIRMENTS: Abnormal gait, decreased activity tolerance, decreased balance, decreased coordination, decreased endurance, decreased mobility, difficulty walking, decreased ROM, decreased strength, hypomobility, increased fascial restrictions, impaired perceived functional ability, impaired flexibility, impaired sensation, and pain.   ACTIVITY LIMITATIONS: carrying, lifting, bending, sitting, standing, squatting, sleeping, stairs, transfers, dressing, locomotion level, and caring for others  PARTICIPATION LIMITATIONS: meal prep, cleaning, laundry, shopping, community activity, and occupation  REHAB POTENTIAL: Good  CLINICAL DECISION MAKING: Stable/uncomplicated  EVALUATION COMPLEXITY: Low  GOALS: Goals reviewed with patient? No  SHORT TERM GOALS: Target date: 11/14/22 Patient  will be independent with initial HEP  Baseline: Goal status: MET  2.   Patient will report at least 50% improvement in overall symptoms and/or function to demonstrate improved functional mobility  Baseline:  Goal status: MET   LONG TERM GOALS: Target date:01/17/2023  Patient will be independent in self management strategies to improve quality of life and functional outcomes.   Baseline:  Goal status: IN PROGRESS  2.   Patient will report at least 75% improvement in overall symptoms and/or function to demonstrate improved functional mobility  Baseline:  Goal status: MET  3.  Patient will improve LEFS score by 5 points to demonstrate improved functional mobility  Baseline: 32/80 12/25/22 Goal status: IN PROGRESS  4.  Patient will improve TUG score to 9 sec or less to demonstrate improved functional mobility and decreased fall risk Baseline: 10.53; 8.3 sec met Goal status: MET  5.  Patient will improve SLS each leg to 10" to demonstrate improved standing balance Baseline: 5" each leg; 12/25/22 4" left and 7" on right Goal status: IN PROGRESS   PLAN:  PT FREQUENCY: 2x/week  PT DURATION: 4 weeks  Planned Interventions:Therapeutic exercises, Therapeutic activity, Neuromuscular re-education, Balance training, Gait training, Patient/Family education, Joint manipulation, Joint mobilization, Stair training, Orthotic/Fit training, DME instructions, Aquatic Therapy, Dry Needling, Electrical stimulation, Spinal manipulation, Spinal mobilization, Cryotherapy, Moist heat, Compression bandaging, scar mobilization, Splintting, Taping, Traction, Ultrasound, Ionotophoresis '4mg'$ /ml Dexamethasone, and Manual therapy  PLAN FOR NEXT SESSION: Continue POC and may progress as tolerated with emphasis on LE strength and balance. Extend 2 x a week for 3 weeks  12:59 PM, 12/25/22 Alexx Mcburney Small Atiyana Welte MPT [facility] [place] [phone]

## 2023-01-01 ENCOUNTER — Encounter (HOSPITAL_COMMUNITY): Payer: Medicare Other | Admitting: Physical Therapy

## 2023-01-01 ENCOUNTER — Telehealth (HOSPITAL_COMMUNITY): Payer: Self-pay | Admitting: Physical Therapy

## 2023-01-01 NOTE — Telephone Encounter (Signed)
Pt arrived to appointment too late to be treated.  NS for today  Teena Irani, PTA/CLT [place] Ph: [phone]

## 2023-01-08 ENCOUNTER — Ambulatory Visit (HOSPITAL_COMMUNITY): Payer: Medicare Other | Attending: Podiatry | Admitting: Physical Therapy

## 2023-01-08 DIAGNOSIS — M722 Plantar fascial fibromatosis: Secondary | ICD-10-CM | POA: Insufficient documentation

## 2023-01-08 DIAGNOSIS — R262 Difficulty in walking, not elsewhere classified: Secondary | ICD-10-CM | POA: Diagnosis present

## 2023-01-08 NOTE — Therapy (Signed)
OUTPATIENT PHYSICAL THERAPY TREATMENT/ PROGRESS NOTE  Progress Note Reporting Period 10/31/2022 to 12/25/2022  See note below for Objective Data and Assessment of Progress/Goals.           Patient Name: Bruce Weber. MRN: [id number] DOB:[birth year], 72 y.o., male Today's Date: 01/08/2023  END OF SESSION:  PT End of Session - 01/08/23 1035     Visit Number 7    Number of Visits 8    Date for PT Re-Evaluation 12/22/22    Authorization Type [employer] Medicare    Progress Note Due on Visit 10    PT Start Time 1035    PT Stop Time 1115    PT Time Calculation (min) 40 min    Activity Tolerance Patient tolerated treatment well    Behavior During Therapy WFL for tasks assessed/performed              Past Medical History:  Diagnosis Date   Adenoma 11/03/2009   1.1 cm from TCS   DM (diabetes mellitus) (HCC)    GERD (gastroesophageal reflux disease)    High cholesterol    History of kidney stones    HTN (hypertension)    Kidney stones    Obesity (BMI 30-39.9) DEC 2011 223 LBS   Rectal bleeding 12/04/2009   secondary to hemorrhoids   Past Surgical History:  Procedure Laterality Date   CIRCUMCISION     COLONOSCOPY  Dec 2010 [facility]   MOD IH, SIMPLE ADENOMA 1.1 CM   COLONOSCOPY  11/25/2012   SLF: 1. Sessile polyp measuring 76m in size was found at the hepatic flesure; polypectomy was performed using snare cautery 2. Moderate diverticulosis was noted in the ascending colon and sigmoid colon. 3. The colon mucosa was otherwise normal 4. Large internal hemorrhoids.    COLONOSCOPY N/A 03/01/2017   Procedure: COLONOSCOPY;  Surgeon: SDanie Binder MD;  [facility];  Service: Endoscopy;  Laterality: N/A;  10:30 AM   COLONOSCOPY WITH PROPOFOL N/A 04/14/2022   Procedure: COLONOSCOPY WITH PROPOFOL;  Surgeon: CEloise Harman DO;  [facility];  Service: Endoscopy;  Laterality: N/A;  11:00am   EYE SURGERY  05/2018   bilateral cataract surgery   HERNIA REPAIR      POLYPECTOMY  04/14/2022   Procedure: POLYPECTOMY;  Surgeon: CEloise Harman DO;  [facility];  Service: Endoscopy;;   Patient Active Problem List   Diagnosis Date Noted   Essential hypertension 09/07/2020   Diet-controlled diabetes mellitus ([place] 09/07/2020   CKD (chronic kidney disease) stage 4, GFR 15-29 ml/min (HCC) 09/07/2020   SOB (shortness of breath) on exertion 09/07/2020   Foraminal stenosis of lumbar region 05/05/2020   Colon adenomas    Insomnia 01/11/2016   Internal hemorrhoids with complication 035/70/1779  Obstructive sleep apnea 07/27/2013   Seasonal and perennial allergic rhinitis 07/27/2013   Hx of colonic polyps 11/15/2012   UNSPECIFIED DISEASE OF THE JAWS 03/11/2010   COLONIC POLYPS, ADENOMATOUS 06/30/2009   HYPERLIPIDEMIA 10/13/2008   GLOBUS HYSTERICUS 10/13/2008   GASTROESOPHAGEAL REFLUX DISEASE 10/13/2008   RENAL CALCULUS 10/13/2008   Dysphagia, idiopathic 10/13/2008   ABDOMINAL PAIN 10/13/2008   H N P-LUMBAR 06/23/2008   SPINAL STENOSIS 06/23/2008   Chronic midline low back pain without sciatica 06/23/2008    PCP: SBillie Ruddy MD  REFERRING PROVIDER: MTrula Slade DPM  REFERRING DIAG: [id number](ICD-10-CM) - Gait instability M72.2 (ICD-10-CM) - Plantar fasciitis  THERAPY DIAG:  Plantar fasciitis of right foot  Difficulty in walking, not elsewhere classified  Rationale for Evaluation and Treatment: Rehabilitation  ONSET DATE: Feb 2022   SUBJECTIVE:   SUBJECTIVE STATEMENT: Patient states he's been busy with his mother in the hospital.  States it is better for the most part, still with sharp pains and sore to the touch at his lateral Rt ankle/heel region.    Evaluation: Fractured a bone in right foot last feb; fell down bleachers; 2 days later went and got an x-[place].  Dr. Jacqualyn Posey treated him for that.  Walks "like a drunk"; thinks this is a combination of his foot and his back; tried orthotics in shoe; didn't help much.    PERTINENT HISTORY: History of 6 herniated discs Takes care of his 38 yo mother Hx of 2 injections in his back; the latest about 9 months  PAIN:  Are you having pain? No  PRECAUTIONS: None  WEIGHT BEARING RESTRICTIONS: No  FALLS:  Has patient fallen in last 6 months? No  LIVING ENVIRONMENT: Lives with: lives with their spouse Lives in: House/apartment Stairs: Yes: Internal: 8 steps; on left going up and External: 3 steps; on right going up, on left going up, and can reach both Has following equipment at home: Crutches  OCCUPATION: works behind a Teaching laboratory technician  PLOF: Independent  PATIENT GOALS: build up some endurance; walk for longer periods of time; walk for fitness  NEXT MD VISIT:   OBJECTIVE:   DIAGNOSTIC FINDINGS: multiple x-rays  PATIENT SURVEYS:  LEFS 38/80; 47.5%  COGNITION: Overall cognitive status: Within functional limits for tasks assessed     SENSATION: Occassionally right foot   POSTURE: rounded shoulders and forward head  PALPATION: Tenderness right plantar fascia; palpable nodules in fascia  LOWER EXTREMITY ROM:  Active ROM Right eval Left eval  Hip flexion    Hip extension    Hip abduction    Hip adduction    Hip internal rotation    Hip external rotation    Knee flexion    Knee extension    Ankle dorsiflexion 12 20  Ankle plantarflexion 40 46  Ankle inversion    Ankle eversion     (Blank rows = not tested)  LOWER EXTREMITY MMT:  MMT Right eval Left eval Right 11/29/22   Hip flexion 4+ 5 5  Hip extension     Hip abduction     Hip adduction     Hip internal rotation     Hip external rotation     Knee flexion     Knee extension 4+ 5 5  Ankle dorsiflexion 4+ 5 5  Ankle plantarflexion     Ankle inversion     Ankle eversion      (Blank rows = not tested)  FUNCTIONAL TESTS:  5 times sit to stand: 9.30 sec no UE assist; tends to lean back on heels Timed up and go (TUG): 10.53 sec SLS 5" each foot   GAIT: Distance  walked: 50 ft Assistive device utilized: none Level of assistance: SBA Comments: occassionally takes a misstep   TODAY'S TREATMENT:  DATE:   01/08/23 Nustep 5 minutes level 3 UE/LE for warmup Standing: Heelraise 20X on incline  Toeraise 20X on incline  Slant board stretch 3X30"  Vectors 10X5" each with 1 UE assist  6" step lateral step up 20X eccentric lowering 1 HHA Sit to stand no UE 10X from standard  chair  12/25/22 Progress note SLS Left 4" Right 7" TUG 8.3 sec LEFS 32/80  Nustep seat 9 x 5' dynamic warm up level 4 dynamic warm up HR 1 x 20  Toe raises x 20 Reaching in various direction while standing on foam x 30" on each side Calf slantboard  stretch 3x 30 second holds Step up 8 inch 2x 10 bilateral  SLS with vectors 5 x 5 second holds bilateral  STS 1 x 10 Stepping over 4"  3 hurdles x 5 rounds  12/19/22 Nustep seat 9 x 5' dynamic warm up level 3 dynamic warm up HR 1 x 20  Step taps 7 inch steps 2x 10 bilateral  Calf stretch 3x 30 second holds Step up 7 inch 2x 10 bilateral  SLS with vectors 5 x 5 second holds bilateral  STS 1 x 10   11/29/22 Progress note LEFS 23/80  5 times sit to stand 12.64  Nustep seat 8 x 5' dynamic warm up level 2 dynamic warm up  Standing: Heel/toe raises 2 x 10 Mini squats 2 x 10 Marching x 20 Tandem stance x 20" each Toe taps 4" box x 10 each 4" step ups x 10 each  11/22/22 Nustep seat 8 x 5' dynamic warm up  Standing: Heel/toe raises x 20 Slant board 5 x 20" Tandem stance 2 x 30" each Hip vectors 5" x 5 Squats to target chair x 10 Rocker board F/B and S/S x 10 each Tandem walking on 15' line Sit to stand from chair x 10 no UE assist    Patient education: Education details: Patient educated on exam findings, POC, scope of PT, HEP. Person educated: Patient Education method: Explanation,  Demonstration, and Handouts Education comprehension: verbalized understanding, returned demonstration, verbal cues required, and tactile cues required   HOME EXERCISE PROGRAM: Access Code: [email or web address] URL: [email or web address] Date: 10/31/2022 Prepared by: [facility]  Exercises - Standing Single Leg Stance with Counter Support  - 1 x daily - 7 x weekly - 1 sets - 3 reps - 10 sec hold - Standing Tandem Balance with Counter Support  - 1 x daily - 7 x weekly - 1 sets - 3 reps - 10 sec hold - Long Sitting Calf Stretch with Strap  - 1 x daily - 7 x weekly - 1 sets - 5 reps - 20" hold  ASSESSMENT:  CLINICAL IMPRESSION: Continued to focus on improving LE strength/stability .  SLS remains most challenging with max ability to maintain no greater than 5 seconds without UE assist.  Pt without any complaints of pain completing therex, min rest breaks.  Cues for general form, completed with SBA.  Patient will continue to benefit from skilled therapy to address deficits and promote optimal function.  OBJECTIVE IMPAIRMENTS: Abnormal gait, decreased activity tolerance, decreased balance, decreased coordination, decreased endurance, decreased mobility, difficulty walking, decreased ROM, decreased strength, hypomobility, increased fascial restrictions, impaired perceived functional ability, impaired flexibility, impaired sensation, and pain.   ACTIVITY LIMITATIONS: carrying, lifting, bending, sitting, standing, squatting, sleeping, stairs, transfers, dressing, locomotion level, and caring for others  PARTICIPATION LIMITATIONS: meal prep, cleaning, laundry, shopping, community activity, and occupation  REHAB POTENTIAL: Good  CLINICAL DECISION MAKING: Stable/uncomplicated  EVALUATION COMPLEXITY: Low   GOALS: Goals reviewed with patient? No  SHORT TERM GOALS: Target date: 11/14/22 Patient will be independent with initial HEP  Baseline: Goal status: MET  2.   Patient will report at  least 50% improvement in overall symptoms and/or function to demonstrate improved functional mobility  Baseline:  Goal status: MET   LONG TERM GOALS: Target date:01/17/2023  Patient will be independent in self management strategies to improve quality of life and functional outcomes.   Baseline:  Goal status: IN PROGRESS  2.   Patient will report at least 75% improvement in overall symptoms and/or function to demonstrate improved functional mobility  Baseline:  Goal status: MET  3.  Patient will improve LEFS score by 5 points to demonstrate improved functional mobility  Baseline: 32/80 12/25/22 Goal status: IN PROGRESS  4.  Patient will improve TUG score to 9 sec or less to demonstrate improved functional mobility and decreased fall risk Baseline: 10.53; 8.3 sec met Goal status: MET  5.  Patient will improve SLS each leg to 10" to demonstrate improved standing balance Baseline: 5" each leg; 12/25/22 4" left and 7" on right Goal status: IN PROGRESS   PLAN:  PT FREQUENCY: 2x/week  PT DURATION: 4 weeks  Planned Interventions:Therapeutic exercises, Therapeutic activity, Neuromuscular re-education, Balance training, Gait training, Patient/Family education, Joint manipulation, Joint mobilization, Stair training, Orthotic/Fit training, DME instructions, Aquatic Therapy, Dry Needling, Electrical stimulation, Spinal manipulation, Spinal mobilization, Cryotherapy, Moist heat, Compression bandaging, scar mobilization, Splintting, Taping, Traction, Ultrasound, Ionotophoresis '4mg'$ /ml Dexamethasone, and Manual therapy  PLAN FOR NEXT SESSION: Continue POC and may progress as tolerated with emphasis on LE strength and balance.   11:44 AM, 01/08/23 Teena Irani, PTA/CLT [place] Ph: [phone]

## 2023-01-11 ENCOUNTER — Other Ambulatory Visit: Payer: Self-pay | Admitting: Internal Medicine

## 2023-01-11 DIAGNOSIS — I10 Essential (primary) hypertension: Secondary | ICD-10-CM

## 2023-01-15 ENCOUNTER — Ambulatory Visit (HOSPITAL_COMMUNITY): Payer: Medicare Other | Admitting: Physical Therapy

## 2023-01-15 DIAGNOSIS — M722 Plantar fascial fibromatosis: Secondary | ICD-10-CM | POA: Diagnosis not present

## 2023-01-15 DIAGNOSIS — R262 Difficulty in walking, not elsewhere classified: Secondary | ICD-10-CM

## 2023-01-15 NOTE — Therapy (Signed)
OUTPATIENT PHYSICAL THERAPY TREATMENT     Patient Name: Bruce Weber. MRN: [id number] DOB:10-18-1951, 72 y.o., male Today's Date: 01/15/2023  END OF SESSION:  PT End of Session - 01/15/23 1126     Visit Number 8    Number of Visits 12    Date for PT Re-Evaluation 01/22/23    Authorization Type [employer] Medicare    Progress Note Due on Visit 16    PT Start Time 1118    PT Stop Time 1158    PT Time Calculation (min) 40 min    Activity Tolerance Patient tolerated treatment well    Behavior During Therapy WFL for tasks assessed/performed              Past Medical History:  Diagnosis Date   Adenoma 11/03/2009   1.1 cm from TCS   DM (diabetes mellitus) (HCC)    GERD (gastroesophageal reflux disease)    High cholesterol    History of kidney stones    HTN (hypertension)    Kidney stones    Obesity (BMI 30-39.9) DEC 2011 223 LBS   Rectal bleeding 12/04/2009   secondary to hemorrhoids   Past Surgical History:  Procedure Laterality Date   CIRCUMCISION     COLONOSCOPY  Dec 2010 [facility]   MOD IH, SIMPLE ADENOMA 1.1 CM   COLONOSCOPY  11/25/2012   SLF: 1. Sessile polyp measuring 79m in size was found at the hepatic flesure; polypectomy was performed using snare cautery 2. Moderate diverticulosis was noted in the ascending colon and sigmoid colon. 3. The colon mucosa was otherwise normal 4. Large internal hemorrhoids.    COLONOSCOPY N/A 03/01/2017   Procedure: COLONOSCOPY;  Surgeon: SDanie Binder MD;  [facility];  Service: Endoscopy;  Laterality: N/A;  10:30 AM   COLONOSCOPY WITH PROPOFOL N/A 04/14/2022   Procedure: COLONOSCOPY WITH PROPOFOL;  Surgeon: CEloise Harman DO;  [facility];  Service: Endoscopy;  Laterality: N/A;  11:00am   EYE SURGERY  05/2018   bilateral cataract surgery   HERNIA REPAIR     POLYPECTOMY  04/14/2022   Procedure: POLYPECTOMY;  Surgeon: CEloise Harman DO;  [facility];  Service: Endoscopy;;   Patient Active  Problem List   Diagnosis Date Noted   Essential hypertension 09/07/2020   Diet-controlled diabetes mellitus ([place] 09/07/2020   CKD (chronic kidney disease) stage 4, GFR 15-29 ml/min (HCC) 09/07/2020   SOB (shortness of breath) on exertion 09/07/2020   Foraminal stenosis of lumbar region 05/05/2020   Colon adenomas    Insomnia 01/11/2016   Internal hemorrhoids with complication 00000000  Obstructive sleep apnea 07/27/2013   Seasonal and perennial allergic rhinitis 07/27/2013   Hx of colonic polyps 11/15/2012   UNSPECIFIED DISEASE OF THE JAWS 03/11/2010   COLONIC POLYPS, ADENOMATOUS 06/30/2009   HYPERLIPIDEMIA 10/13/2008   GLOBUS HYSTERICUS 10/13/2008   GASTROESOPHAGEAL REFLUX DISEASE 10/13/2008   RENAL CALCULUS 10/13/2008   Dysphagia, idiopathic 10/13/2008   ABDOMINAL PAIN 10/13/2008   H N P-LUMBAR 06/23/2008   SPINAL STENOSIS 06/23/2008   Chronic midline low back pain without sciatica 06/23/2008    PCP: SBillie Ruddy MD  REFERRING PROVIDER: MTrula Slade DPM  REFERRING DIAG: [phone](ICD-10-CM) - Gait instability M72.2 (ICD-10-CM) - Plantar fasciitis  THERAPY DIAG:  Plantar fasciitis of right foot  Difficulty in walking, not elsewhere classified  Rationale for Evaluation and Treatment: Rehabilitation  ONSET DATE: Feb 2022   SUBJECTIVE:   SUBJECTIVE STATEMENT: Patient  states he's not had any foot pain in 3 days.  States still with some bil knee pains.    Evaluation: Fractured a bone in right foot last feb; fell down bleachers; 2 days later went and got an x-[place].  Dr. Jacqualyn Posey treated him for that.  Walks "like a drunk"; thinks this is a combination of his foot and his back; tried orthotics in shoe; didn't help much.   PERTINENT HISTORY: History of 6 herniated discs Takes care of his 23 yo mother Hx of 2 injections in his back; the latest about 9 months  PAIN:  Are you having pain? No  PRECAUTIONS: None  WEIGHT BEARING RESTRICTIONS: No  FALLS:  Has  patient fallen in last 6 months? No  LIVING ENVIRONMENT: Lives with: lives with their spouse Lives in: House/apartment Stairs: Yes: Internal: 8 steps; on left going up and External: 3 steps; on right going up, on left going up, and can reach both Has following equipment at home: Crutches  OCCUPATION: works behind a Teaching laboratory technician  PLOF: Independent  PATIENT GOALS: build up some endurance; walk for longer periods of time; walk for fitness  NEXT MD VISIT:   OBJECTIVE:   DIAGNOSTIC FINDINGS: multiple x-rays  PATIENT SURVEYS:  LEFS 38/80; 47.5%  COGNITION: Overall cognitive status: Within functional limits for tasks assessed     SENSATION: Occassionally right foot   POSTURE: rounded shoulders and forward head  PALPATION: Tenderness right plantar fascia; palpable nodules in fascia  LOWER EXTREMITY ROM:  Active ROM Right eval Left eval  Hip flexion    Hip extension    Hip abduction    Hip adduction    Hip internal rotation    Hip external rotation    Knee flexion    Knee extension    Ankle dorsiflexion 12 20  Ankle plantarflexion 40 46  Ankle inversion    Ankle eversion     (Blank rows = not tested)  LOWER EXTREMITY MMT:  MMT Right eval Left eval Right 11/29/22   Hip flexion 4+ 5 5  Hip extension     Hip abduction     Hip adduction     Hip internal rotation     Hip external rotation     Knee flexion     Knee extension 4+ 5 5  Ankle dorsiflexion 4+ 5 5  Ankle plantarflexion     Ankle inversion     Ankle eversion      (Blank rows = not tested)  FUNCTIONAL TESTS:  5 times sit to stand: 9.30 sec no UE assist; tends to lean back on heels Timed up and go (TUG): 10.53 sec SLS 5" each foot   GAIT: Distance walked: 50 ft Assistive device utilized: none Level of assistance: SBA Comments: occassionally takes a misstep   TODAY'S TREATMENT:  DATE:  01/15/23 Nustep 5 minutes level 3 UE/LE for warmup Standing: Heelraise 20X on incline  Slant board stretch 3X30"  Vectors 10X5" each with 1 UE assist  Dynamic balance on line, tandem gait 2RT with min Assist  TAndem on balance beam 2X30' each LE lead  SLS 5X trail  Lunge onto BOSU ball dome up, no UE 15X each  01/08/23 Nustep 5 minutes level 3 UE/LE for warmup Standing: Heelraise 20X on incline  Toeraise 20X on incline  Slant board stretch 3X30"  Vectors 10X5" each with 1 UE assist  6" step lateral step up 20X eccentric lowering 1 HHA Sit to stand no UE 10X from standard  chair  12/25/22 Progress note SLS Left 4" Right 7" TUG 8.3 sec LEFS 32/80  Nustep seat 9 x 5' dynamic warm up level 4 dynamic warm up HR 1 x 20  Toe raises x 20 Reaching in various direction while standing on foam x 30" on each side Calf slantboard  stretch 3x 30 second holds Step up 8 inch 2x 10 bilateral  SLS with vectors 5 x 5 second holds bilateral  STS 1 x 10 Stepping over 4"  3 hurdles x 5 rounds  12/19/22 Nustep seat 9 x 5' dynamic warm up level 3 dynamic warm up HR 1 x 20  Step taps 7 inch steps 2x 10 bilateral  Calf stretch 3x 30 second holds Step up 7 inch 2x 10 bilateral  SLS with vectors 5 x 5 second holds bilateral  STS 1 x 10   11/29/22 Progress note LEFS 23/80  5 times sit to stand 12.64  Nustep seat 8 x 5' dynamic warm up level 2 dynamic warm up  Standing: Heel/toe raises 2 x 10 Mini squats 2 x 10 Marching x 20 Tandem stance x 20" each Toe taps 4" box x 10 each 4" step ups x 10 each  11/22/22 Nustep seat 8 x 5' dynamic warm up  Standing: Heel/toe raises x 20 Slant board 5 x 20" Tandem stance 2 x 30" each Hip vectors 5" x 5 Squats to target chair x 10 Rocker board F/B and S/S x 10 each Tandem walking on 15' line Sit to stand from chair x 10 no UE assist    Patient education: Education details: Patient educated on exam findings, POC, scope of PT,  HEP. Person educated: Patient Education method: Explanation, Demonstration, and Handouts Education comprehension: verbalized understanding, returned demonstration, verbal cues required, and tactile cues required   HOME EXERCISE PROGRAM: Access Code: [email or web address] URL: [email or web address] Date: 10/31/2022 Prepared by: [facility]  Exercises - Standing Single Leg Stance with Counter Support  - 1 x daily - 7 x weekly - 1 sets - 3 reps - 10 sec hold - Standing Tandem Balance with Counter Support  - 1 x daily - 7 x weekly - 1 sets - 3 reps - 10 sec hold - Long Sitting Calf Stretch with Strap  - 1 x daily - 7 x weekly - 1 sets - 5 reps - 20" hold  ASSESSMENT:  CLINICAL IMPRESSION: Focused on balance progression this session. Pt required min assist and cues for forward gaze with added tandem gait.  Increased difficulty of tandem stance using foam balance beam with increased challenge.  Pt unable to maintain greater than 10" without UE assist with either LE lead.  Lunges added onto BOSU surface which was also challenging.  Pt without any complaints of pain completing therex, min rest breaks.  Patient will  continue to benefit from skilled therapy to address deficits and promote optimal function.  OBJECTIVE IMPAIRMENTS: Abnormal gait, decreased activity tolerance, decreased balance, decreased coordination, decreased endurance, decreased mobility, difficulty walking, decreased ROM, decreased strength, hypomobility, increased fascial restrictions, impaired perceived functional ability, impaired flexibility, impaired sensation, and pain.   ACTIVITY LIMITATIONS: carrying, lifting, bending, sitting, standing, squatting, sleeping, stairs, transfers, dressing, locomotion level, and caring for others  PARTICIPATION LIMITATIONS: meal prep, cleaning, laundry, shopping, community activity, and occupation  REHAB POTENTIAL: Good  CLINICAL DECISION MAKING: Stable/uncomplicated  EVALUATION  COMPLEXITY: Low   GOALS: Goals reviewed with patient? No  SHORT TERM GOALS: Target date: 11/14/22 Patient will be independent with initial HEP  Baseline: Goal status: MET  2.   Patient will report at least 50% improvement in overall symptoms and/or function to demonstrate improved functional mobility  Baseline:  Goal status: MET   LONG TERM GOALS: Target date:01/17/2023  Patient will be independent in self management strategies to improve quality of life and functional outcomes.   Baseline:  Goal status: IN PROGRESS  2.   Patient will report at least 75% improvement in overall symptoms and/or function to demonstrate improved functional mobility  Baseline:  Goal status: MET  3.  Patient will improve LEFS score by 5 points to demonstrate improved functional mobility  Baseline: 32/80 12/25/22 Goal status: IN PROGRESS  4.  Patient will improve TUG score to 9 sec or less to demonstrate improved functional mobility and decreased fall risk Baseline: 10.53; 8.3 sec met Goal status: MET  5.  Patient will improve SLS each leg to 10" to demonstrate improved standing balance Baseline: 5" each leg; 12/25/22 4" left and 7" on right Goal status: IN PROGRESS   PLAN:  PT FREQUENCY: 2x/week  PT DURATION: 4 weeks  Planned Interventions:Therapeutic exercises, Therapeutic activity, Neuromuscular re-education, Balance training, Gait training, Patient/Family education, Joint manipulation, Joint mobilization, Stair training, Orthotic/Fit training, DME instructions, Aquatic Therapy, Dry Needling, Electrical stimulation, Spinal manipulation, Spinal mobilization, Cryotherapy, Moist heat, Compression bandaging, scar mobilization, Splintting, Taping, Traction, Ultrasound, Ionotophoresis 48m/ml Dexamethasone, and Manual therapy  PLAN FOR NEXT SESSION: Reassess next session  11:28 AM, 01/15/23 ATeena Irani PTA/CLT CPontoosucAColumb[facility] CenterPh: [phone]

## 2023-01-22 ENCOUNTER — Ambulatory Visit (HOSPITAL_COMMUNITY): Payer: Medicare Other

## 2023-01-22 DIAGNOSIS — M722 Plantar fascial fibromatosis: Secondary | ICD-10-CM | POA: Diagnosis not present

## 2023-01-22 DIAGNOSIS — R262 Difficulty in walking, not elsewhere classified: Secondary | ICD-10-CM

## 2023-01-22 NOTE — Therapy (Signed)
OUTPATIENT PHYSICAL THERAPY TREATMENT     Patient Name: Bruce Weber. MRN: [id number] DOB:[birth year], 72 y.o., male Today's Date: 01/22/2023  END OF SESSION:  PT End of Session - 01/22/23 1034     Visit Number 9    Number of Visits 12    Date for PT Re-Evaluation 01/22/23    Authorization Type [employer] Medicare    Progress Note Due on Visit 16    PT Start Time 1031    PT Stop Time 1112    PT Time Calculation (min) 41 min    Activity Tolerance Patient tolerated treatment well    Behavior During Therapy WFL for tasks assessed/performed              Past Medical History:  Diagnosis Date   Adenoma 11/03/2009   1.1 cm from TCS   DM (diabetes mellitus) (HCC)    GERD (gastroesophageal reflux disease)    High cholesterol    History of kidney stones    HTN (hypertension)    Kidney stones    Obesity (BMI 30-39.9) DEC 2011 223 LBS   Rectal bleeding 12/04/2009   secondary to hemorrhoids   Past Surgical History:  Procedure Laterality Date   CIRCUMCISION     COLONOSCOPY  Dec 2010 [facility]   MOD IH, SIMPLE ADENOMA 1.1 CM   COLONOSCOPY  11/25/2012   SLF: 1. Sessile polyp measuring 92m in size was found at the hepatic flesure; polypectomy was performed using snare cautery 2. Moderate diverticulosis was noted in the ascending colon and sigmoid colon. 3. The colon mucosa was otherwise normal 4. Large internal hemorrhoids.    COLONOSCOPY N/A 03/01/2017   Procedure: COLONOSCOPY;  Surgeon: SDanie Binder MD;  [facility];  Service: Endoscopy;  Laterality: N/A;  10:30 AM   COLONOSCOPY WITH PROPOFOL N/A 04/14/2022   Procedure: COLONOSCOPY WITH PROPOFOL;  Surgeon: CEloise Harman DO;  [facility];  Service: Endoscopy;  Laterality: N/A;  11:00am   EYE SURGERY  05/2018   bilateral cataract surgery   HERNIA REPAIR     POLYPECTOMY  04/14/2022   Procedure: POLYPECTOMY;  Surgeon: CEloise Harman DO;  [facility];  Service: Endoscopy;;   Patient Active  Problem List   Diagnosis Date Noted   Essential hypertension 09/07/2020   Diet-controlled diabetes mellitus ([place] 09/07/2020   CKD (chronic kidney disease) stage 4, GFR 15-29 ml/min (HCC) 09/07/2020   SOB (shortness of breath) on exertion 09/07/2020   Foraminal stenosis of lumbar region 05/05/2020   Colon adenomas    Insomnia 01/11/2016   Internal hemorrhoids with complication 00000000  Obstructive sleep apnea 07/27/2013   Seasonal and perennial allergic rhinitis 07/27/2013   Hx of colonic polyps 11/15/2012   UNSPECIFIED DISEASE OF THE JAWS 03/11/2010   COLONIC POLYPS, ADENOMATOUS 06/30/2009   HYPERLIPIDEMIA 10/13/2008   GLOBUS HYSTERICUS 10/13/2008   GASTROESOPHAGEAL REFLUX DISEASE 10/13/2008   RENAL CALCULUS 10/13/2008   Dysphagia, idiopathic 10/13/2008   ABDOMINAL PAIN 10/13/2008   H N P-LUMBAR 06/23/2008   SPINAL STENOSIS 06/23/2008   Chronic midline low back pain without sciatica 06/23/2008    PCP: SBillie Ruddy MD  REFERRING PROVIDER: MTrula Slade DPM  REFERRING DIAG: [phone](ICD-10-CM) - Gait instability M72.2 (ICD-10-CM) - Plantar fasciitis  THERAPY DIAG:  Plantar fasciitis of right foot  Difficulty in walking, not elsewhere classified  Rationale for Evaluation and Treatment: Rehabilitation  ONSET DATE: Feb 2022   SUBJECTIVE:   SUBJECTIVE STATEMENT: Patient  states he's not had any foot pain in 3 days.  States still with some bil knee pains.    Evaluation: Fractured a bone in right foot last feb; fell down bleachers; 2 days later went and got an x-[place].  Dr. Jacqualyn Posey treated him for that.  Walks "like a drunk"; thinks this is a combination of his foot and his back; tried orthotics in shoe; didn't help much.   PERTINENT HISTORY: History of 6 herniated discs Takes care of his 69 yo mother Hx of 2 injections in his back; the latest about 9 months  PAIN:  Are you having pain? No  PRECAUTIONS: None  WEIGHT BEARING RESTRICTIONS: No  FALLS:  Has  patient fallen in last 6 months? No  LIVING ENVIRONMENT: Lives with: lives with their spouse Lives in: House/apartment Stairs: Yes: Internal: 8 steps; on left going up and External: 3 steps; on right going up, on left going up, and can reach both Has following equipment at home: Crutches  OCCUPATION: works behind a Teaching laboratory technician  PLOF: Independent  PATIENT GOALS: build up some endurance; walk for longer periods of time; walk for fitness  NEXT MD VISIT:   OBJECTIVE:   DIAGNOSTIC FINDINGS: multiple x-rays  PATIENT SURVEYS:  LEFS 38/80; 47.5%  COGNITION: Overall cognitive status: Within functional limits for tasks assessed     SENSATION: Occassionally right foot   POSTURE: rounded shoulders and forward head  PALPATION: Tenderness right plantar fascia; palpable nodules in fascia  LOWER EXTREMITY ROM:  Active ROM Right eval Left eval  Hip flexion    Hip extension    Hip abduction    Hip adduction    Hip internal rotation    Hip external rotation    Knee flexion    Knee extension    Ankle dorsiflexion 12 20  Ankle plantarflexion 40 46  Ankle inversion    Ankle eversion     (Blank rows = not tested)  LOWER EXTREMITY MMT:  MMT Right eval Left eval Right 11/29/22   Hip flexion 4+ 5 5  Hip extension     Hip abduction     Hip adduction     Hip internal rotation     Hip external rotation     Knee flexion     Knee extension 4+ 5 5  Ankle dorsiflexion 4+ 5 5  Ankle plantarflexion     Ankle inversion     Ankle eversion      (Blank rows = not tested)  FUNCTIONAL TESTS:  5 times sit to stand: 9.30 sec no UE assist; tends to lean back on heels Timed up and go (TUG): 10.53 sec SLS 5" each foot   GAIT: Distance walked: 50 ft Assistive device utilized: none Level of assistance: SBA Comments: occassionally takes a misstep   TODAY'S TREATMENT:  DATE:  01/22/23 Nustep seat 11 x 5'   5 time sit to stand  11.15 sec SLS left 20 sec; rigth 13 sec LEFS 37/80  Update and review of HEP  01/15/23 Nustep 5 minutes level 3 UE/LE for warmup Standing: Heelraise 20X on incline  Slant board stretch 3X30"  Vectors 10X5" each with 1 UE assist  Dynamic balance on line, tandem gait 2RT with min Assist  TAndem on balance beam 2X30' each LE lead  SLS 5X trail  Lunge onto BOSU ball dome up, no UE 15X each  01/08/23 Nustep 5 minutes level 3 UE/LE for warmup Standing: Heelraise 20X on incline  Toeraise 20X on incline  Slant board stretch 3X30"  Vectors 10X5" each with 1 UE assist  6" step lateral step up 20X eccentric lowering 1 HHA Sit to stand no UE 10X from standard  chair  12/25/22 Progress note SLS Left 4" Right 7" TUG 8.3 sec LEFS 32/80  Nustep seat 9 x 5' dynamic warm up level 4 dynamic warm up HR 1 x 20  Toe raises x 20 Reaching in various direction while standing on foam x 30" on each side Calf slantboard  stretch 3x 30 second holds Step up 8 inch 2x 10 bilateral  SLS with vectors 5 x 5 second holds bilateral  STS 1 x 10 Stepping over 4"  3 hurdles x 5 rounds  12/19/22 Nustep seat 9 x 5' dynamic warm up level 3 dynamic warm up HR 1 x 20  Step taps 7 inch steps 2x 10 bilateral  Calf stretch 3x 30 second holds Step up 7 inch 2x 10 bilateral  SLS with vectors 5 x 5 second holds bilateral  STS 1 x 10   11/29/22 Progress note LEFS 23/80  5 times sit to stand 12.64  Nustep seat 8 x 5' dynamic warm up level 2 dynamic warm up  Standing: Heel/toe raises 2 x 10 Mini squats 2 x 10 Marching x 20 Tandem stance x 20" each Toe taps 4" box x 10 each 4" step ups x 10 each  11/22/22 Nustep seat 8 x 5' dynamic warm up  Standing: Heel/toe raises x 20 Slant board 5 x 20" Tandem stance 2 x 30" each Hip vectors 5" x 5 Squats to target chair x 10 Rocker board F/B and S/S x 10 each Tandem walking on 15' line Sit  to stand from chair x 10 no UE assist    Patient education: Education details: Patient educated on exam findings, POC, scope of PT, HEP. Person educated: Patient Education method: Explanation, Demonstration, and Handouts Education comprehension: verbalized understanding, returned demonstration, verbal cues required, and tactile cues required   HOME EXERCISE PROGRAM: Access Code: [email or web address] URL: [email or web address] Date: 01/22/2023 Prepared by: [facility]  Exercises - Standing Single Leg Stance with Counter Support  - 1 x daily - 7 x weekly - 1 sets - 3 reps - 10 sec hold - Standing Tandem Balance with Counter Support  - 1 x daily - 7 x weekly - 1 sets - 3 reps - 10 sec hold - Long Sitting Calf Stretch with Strap  - 1 x daily - 7 x weekly - 1 sets - 5 reps - 20" hold - Sit to Stand Without Arm Support  - 1 x daily - 7 x weekly - 1 sets - 10 reps - Mini Squat with Counter Support  - 1 x daily - 7 x weekly - 1 sets - 10 reps - Heel  Toe Raises with Counter Support  - 1 x daily - 7 x weekly - 1 sets - 10 reps - Standing March with Counter Support  - 1 x daily - 7 x weekly - 1 sets - 10 reps - Standing Hip Abduction with Counter Support  - 1 x daily - 7 x weekly - 1 sets - 10 reps - Standing Hip Extension with Counter Support  - 1 x daily - 7 x weekly - 1 sets - 10 reps  Access Code: [email or web address] URL: [email or web address] Date: 10/31/2022 Prepared by: [facility]  Exercises - Standing Single Leg Stance with Counter Support  - 1 x daily - 7 x weekly - 1 sets - 3 reps - 10 sec hold - Standing Tandem Balance with Counter Support  - 1 x daily - 7 x weekly - 1 sets - 3 reps - 10 sec hold - Long Sitting Calf Stretch with Strap  - 1 x daily - 7 x weekly - 1 sets - 5 reps - 20" hold  ASSESSMENT:  CLINICAL IMPRESSION: Progress note today. Patient has met all set rehab goals and is agreeable to discharge today.    OBJECTIVE IMPAIRMENTS: Abnormal gait, decreased  activity tolerance, decreased balance, decreased coordination, decreased endurance, decreased mobility, difficulty walking, decreased ROM, decreased strength, hypomobility, increased fascial restrictions, impaired perceived functional ability, impaired flexibility, impaired sensation, and pain.   ACTIVITY LIMITATIONS: carrying, lifting, bending, sitting, standing, squatting, sleeping, stairs, transfers, dressing, locomotion level, and caring for others  PARTICIPATION LIMITATIONS: meal prep, cleaning, laundry, shopping, community activity, and occupation  REHAB POTENTIAL: Good  CLINICAL DECISION MAKING: Stable/uncomplicated  EVALUATION COMPLEXITY: Low   GOALS: Goals reviewed with patient? No  SHORT TERM GOALS: Target date: 11/14/22 Patient will be independent with initial HEP  Baseline: Goal status: MET  2.   Patient will report at least 50% improvement in overall symptoms and/or function to demonstrate improved functional mobility  Baseline:  Goal status: MET   LONG TERM GOALS: Target date:01/17/2023  Patient will be independent in self management strategies to improve quality of life and functional outcomes.   Baseline:  Goal status: MET  2.   Patient will report at least 75% improvement in overall symptoms and/or function to demonstrate improved functional mobility  Baseline:  Goal status: MET  3.  Patient will improve LEFS score by 5 points to demonstrate improved functional mobility  Baseline: 32/80 12/25/22; 37/80 01/22/23 Goal status: MET  4.  Patient will improve TUG score to 9 sec or less to demonstrate improved functional mobility and decreased fall risk Baseline: 10.53; 8.3 sec met Goal status: MET  5.  Patient will improve SLS each leg to 10" to demonstrate improved standing balance Baseline: 5" each leg; 12/25/22 4" left and 7" on right; 01/22/23 right leg 13 sec; left leg 20 sec Goal status: MET   PLAN:  PT FREQUENCY: 2x/week  PT DURATION: 4  weeks  Planned Interventions:Therapeutic exercises, Therapeutic activity, Neuromuscular re-education, Balance training, Gait training, Patient/Family education, Joint manipulation, Joint mobilization, Stair training, Orthotic/Fit training, DME instructions, Aquatic Therapy, Dry Needling, Electrical stimulation, Spinal manipulation, Spinal mobilization, Cryotherapy, Moist heat, Compression bandaging, scar mobilization, Splintting, Taping, Traction, Ultrasound, Ionotophoresis 57m/ml Dexamethasone, and Manual therapy  PLAN FOR NEXT SESSION: discharge  11:12 AM, 01/22/23 Shonita Rinck Small Aniceto Kyser MPT [facility] [place] #[phone]:[phone]

## 2023-02-01 ENCOUNTER — Encounter: Payer: Self-pay | Admitting: Radiology

## 2023-02-10 ENCOUNTER — Other Ambulatory Visit: Payer: Self-pay

## 2023-02-10 ENCOUNTER — Encounter: Payer: Self-pay | Admitting: Emergency Medicine

## 2023-02-10 ENCOUNTER — Ambulatory Visit
Admission: EM | Admit: 2023-02-10 | Discharge: 2023-02-10 | Disposition: A | Discharge status: Home / Self Care | Payer: Medicare Other | Attending: Family Medicine | Admitting: Family Medicine

## 2023-02-10 ENCOUNTER — Ambulatory Visit (INDEPENDENT_AMBULATORY_CARE_PROVIDER_SITE_OTHER): Payer: Medicare Other

## 2023-02-10 DIAGNOSIS — S62115A Nondisplaced fracture of triquetrum [cuneiform] bone, left wrist, initial encounter for closed fracture: Secondary | ICD-10-CM

## 2023-02-10 HISTORY — DX: Unspecified kidney failure: N19

## 2023-02-10 NOTE — ED Triage Notes (Addendum)
Pt reports tripped and fell while going into the gym for a basketball game last night. Pt reports put left arm out to catch himself. Pt reports left wrist pain/swelling with radiation of pain to left forearm.   Radial pulse strong. Wrist warm to touch.   Denies hitting head, LOC, or being on blood thinners.

## 2023-02-10 NOTE — ED Provider Notes (Signed)
[facility]    CSN: [id number] Arrival date & time: 02/10/23  1114      History   Chief Complaint Chief Complaint  Patient presents with   Arm Pain    HPI Bruce Weber. is a 72 y.o. male.   Pt reports tripped and fell while going into the gym for a basketball game last night. Pt reports put left arm out to catch himself. Pt reports left wrist pain/swelling with radiation of pain to left forearm.    Radial pulse strong. Wrist warm to touch.    Denies hitting head, LOC, or being on blood thinners.       Past Medical History:  Diagnosis Date   Adenoma 11/03/2009   1.1 cm from TCS   DM (diabetes mellitus) (HCC)    GERD (gastroesophageal reflux disease)    High cholesterol    History of kidney stones    HTN (hypertension)    Kidney failure    stage 4   Kidney stones    Obesity (BMI 30-39.9) DEC 2011 223 LBS   Rectal bleeding 12/04/2009   secondary to hemorrhoids    Patient Active Problem List   Diagnosis Date Noted   Essential hypertension 09/07/2020   Diet-controlled diabetes mellitus ([place]) 09/07/2020   CKD (chronic kidney disease) stage 4, GFR 15-29 ml/min (HCC) 09/07/2020   SOB (shortness of breath) on exertion 09/07/2020   Foraminal stenosis of lumbar region 05/05/2020   Colon adenomas    Insomnia 01/11/2016   Internal hemorrhoids with complication 0000000   Obstructive sleep apnea 07/27/2013   Seasonal and perennial allergic rhinitis 07/27/2013   Hx of colonic polyps 11/15/2012   UNSPECIFIED DISEASE OF THE JAWS 03/11/2010   COLONIC POLYPS, ADENOMATOUS 06/30/2009   HYPERLIPIDEMIA 10/13/2008   GLOBUS HYSTERICUS 10/13/2008   GASTROESOPHAGEAL REFLUX DISEASE 10/13/2008   RENAL CALCULUS 10/13/2008   Dysphagia, idiopathic 10/13/2008   ABDOMINAL PAIN 10/13/2008   H N P-LUMBAR 06/23/2008   SPINAL STENOSIS 06/23/2008   Chronic midline low back pain without sciatica 06/23/2008    Past Surgical History:  Procedure Laterality Date    CIRCUMCISION     COLONOSCOPY  Dec 2010 [facility]   MOD IH, SIMPLE ADENOMA 1.1 CM   COLONOSCOPY  11/25/2012   SLF: 1. Sessile polyp measuring 62m in size was found at the hepatic flesure; polypectomy was performed using snare cautery 2. Moderate diverticulosis was noted in the ascending colon and sigmoid colon. 3. The colon mucosa was otherwise normal 4. Large internal hemorrhoids.    COLONOSCOPY N/A 03/01/2017   Procedure: COLONOSCOPY;  Surgeon: SDanie Binder MD;  [facility];  Service: Endoscopy;  Laterality: N/A;  10:30 AM   COLONOSCOPY WITH PROPOFOL N/A 04/14/2022   Procedure: COLONOSCOPY WITH PROPOFOL;  Surgeon: CEloise Harman DO;  [facility];  Service: Endoscopy;  Laterality: N/A;  11:00am   EYE SURGERY  05/2018   bilateral cataract surgery   HERNIA REPAIR     POLYPECTOMY  04/14/2022   Procedure: POLYPECTOMY;  Surgeon: CEloise Harman DO;  [facility];  Service: Endoscopy;;       Home Medications    Prior to Admission medications   Medication Sig Start Date End Date Taking? Authorizing Provider  acetaminophen (TYLENOL) 325 MG tablet Take 2 tablets (650 mg total) by mouth every 6 (six) hours as needed for moderate pain. 01/26/22   MJaynee Eagles PA-C  albuterol (VENTOLIN HFA) 108 (90 Base) MCG/ACT inhaler Inhale 2  puffs into the lungs every 4 (four) hours as needed for wheezing or shortness of breath. 10/20/22   Volney American, PA-C  allopurinol (ZYLOPRIM) 100 MG tablet Take 300 mg by mouth daily.     [provider]  amLODipine (NORVASC) 10 MG tablet TAKE 1 TABLET BY MOUTH EVERYDAY AT BEDTIME 07/20/22   Billie Ruddy, MD  atorvastatin (LIPITOR) 10 MG tablet TAKE 1 TABLET BY MOUTH EVERY DAY (NEED PHYSICAL) 10/19/22   Billie Ruddy, MD  azelastine (ASTELIN) 0.1 % nasal spray Place 1 spray into both nostrils 2 (two) times daily. Use in each nostril as directed 10/20/22   Volney American, PA-C  calcitRIOL (ROCALTROL) 0.25  MCG capsule Take 0.25 mcg by mouth at bedtime.     [provider]  clomiPHENE (CLOMID) 50 MG tablet Take 25 mg by mouth at bedtime.     [provider]  famciclovir (FAMVIR) 500 MG tablet Take 1,000 mg by mouth 2 (two) times daily as needed (fever blisters). 02/19/17   [provider]  famotidine (PEPCID) 40 MG tablet TAKE 1 TABLET (40 MG TOTAL) BY MOUTH DAILY. WITH BREAKFAST 12/07/22   Eloise Harman, DO  fexofenadine [facility] ALLERGY) 60 MG tablet Take 1 tablet (60 mg total) by mouth daily. 11/23/22   Billie Ruddy, MD  fluticasone (FLONASE) 50 MCG/ACT nasal spray Place 1 spray into both nostrils daily for 14 days. Patient taking differently: Place 1 spray into both nostrils at bedtime as needed for allergies. 09/29/20 04/05/22  Avegno, Darrelyn Hillock, FNP  fluticasone (FLONASE) 50 MCG/ACT nasal spray Place 1 spray into both nostrils 2 (two) times daily. 12/11/22   Volney American, PA-C  furosemide (LASIX) 40 MG tablet Take 40 mg by mouth daily.    [provider]  gabapentin (NEURONTIN) 100 MG capsule TAKE 1 CAPSULE BY MOUTH THREE TIMES A DAY 10/24/22   Mordecai Rasmussen, MD  hydrALAZINE (APRESOLINE) 25 MG tablet Take 25 mg by mouth 3 (three) times daily.    [provider]  promethazine-dextromethorphan (PROMETHAZINE-DM) 6.25-15 MG/5ML syrup Take 5 mLs by mouth 4 (four) times daily as needed. 12/11/22   Volney American, PA-C  propranolol (INDERAL) 80 MG tablet TAKE 1 TABLET BY MOUTH TWICE A DAY 10/16/22   Billie Ruddy, MD  rosuvastatin (CRESTOR) 5 MG tablet Take 5 mg by mouth daily.    [provider]  tamsulosin (FLOMAX) 0.4 MG CAPS capsule Take 0.4 mg by mouth daily. 05/25/20   [provider]  traZODone (DESYREL) 50 MG tablet TAKE 1 TABLET BY MOUTH EVERYDAY AT BEDTIME 05/25/22   Young, Tarri Fuller D, MD  cetirizine (ZYRTEC ALLERGY) 10 MG tablet Take 1 tablet (10 mg total) by mouth daily. 09/29/20 09/29/20  AvegnoDarrelyn Hillock,  FNP    Family History Family History  Problem Relation Age of Onset   Lung cancer Father    High blood pressure Father    Colon cancer Neg Hx    Colon polyps Neg Hx     Social History Social History   Tobacco Use   Smoking status: Never    Passive exposure: Never   Smokeless tobacco: Never   Tobacco comments:    Never smoked  Vaping Use   Vaping Use: Never used  Substance Use Topics   Alcohol use: No   Drug use: No     Allergies   Dye fdc blue [brilliant blue fcf (fd&c blue #1)] and Iodinated contrast media  Review of Systems Review of Systems PER  HPI  Physical Exam Triage Vital Signs ED Triage Vitals  Enc Vitals Group     BP 02/10/23 1144 (!) 145/83     Pulse Rate 02/10/23 1144 61     Resp 02/10/23 1144 20     Temp 02/10/23 1144 98.3 F (36.8 C)     Temp Source 02/10/23 1144 Oral     SpO2 02/10/23 1144 96 %     Weight --      Height --      Head Circumference --      Peak Flow --      Pain Score 02/10/23 1141 8     Pain Loc --      Pain Edu? --      Excl. in [place]? --    No data found.  Updated Vital Signs BP (!) 145/83 (BP Location: Right Arm)   Pulse 61   Temp 98.3 F (36.8 C) (Oral)   Resp 20   SpO2 96%   Visual Acuity Right Eye Distance:   Left Eye Distance:   Bilateral Distance:    Right Eye Near:   Left Eye Near:    Bilateral Near:     Physical Exam Vitals and nursing note reviewed.  Constitutional:      Appearance: Normal appearance.  HENT:     Head: Atraumatic.  Eyes:     Extraocular Movements: Extraocular movements intact.     Conjunctiva/sclera: Conjunctivae normal.  Cardiovascular:     Rate and Rhythm: Normal rate.  Pulmonary:     Effort: Pulmonary effort is normal.  Musculoskeletal:        General: Swelling, tenderness and signs of injury present.     Cervical back: Normal range of motion and neck supple.     Comments: Area of edema, significant tenderness to palpation to ulnar aspect of the left wrist.  Decreased  range of motion to the wrist  Skin:    General: Skin is warm and dry.     Findings: No bruising or lesion.  Neurological:     General: No focal deficit present.     Mental Status: He is oriented to person, place, and time.     Comments: Left upper extremity neurovascularly intact  Psychiatric:        Mood and Affect: Mood normal.        Thought Content: Thought content normal.        Judgment: Judgment normal.      UC Treatments / Results  Labs (all labs ordered are listed, but only abnormal results are displayed) Labs Reviewed - No data to display  EKG   Radiology DG Wrist Complete Left  Result Date: 02/10/2023 CLINICAL DATA:  Acute LEFT wrist pain following fall. Initial encounter. EXAM: LEFT WRIST - COMPLETE 3+ VIEW COMPARISON:  None Available. FINDINGS: A small bony density along the dorsal wrist is noted compatible with a triquetral fracture. Dorsal soft tissue swelling is noted. No other acute fracture, subluxation or dislocation identified. IMPRESSION: Triquetral fracture. Electronically Signed   By: Margarette Canada M.D.   On: 02/10/2023 12:00    Procedures Procedures (including critical care time)  Medications Ordered in UC Medications - No data to display  Initial Impression / Assessment and Plan / UC Course  I have reviewed the triage vital signs and the nursing notes.  Pertinent labs & imaging results that were available during my care of the patient were reviewed by me and  considered in my medical decision making (see chart for details).     X-Naasir today showing a triquetral fracture of the left wrist.  Volar splint placed, discussed RICE protocol, over-the-counter pain relievers and close orthopedic follow-up.  Return for worsening symptoms.  Final Clinical Impressions(s) / UC Diagnoses   Final diagnoses:  Closed nondisplaced fracture of triquetrum of left wrist, initial encounter     Discharge Instructions      Elevate the hand at rest to help with  swelling, ibuprofen and Tylenol as needed, keep splint on until given further recommendations by orthopedics.  I have given you some resources for a local orthopedic group, however you can call whoever you feel most comfortable with to make your appointment    ED Prescriptions   None    PDMP not reviewed this encounter.   Volney American, [place] 02/10/23 1301

## 2023-02-10 NOTE — Discharge Instructions (Signed)
Elevate the hand at rest to help with swelling, ibuprofen and Tylenol as needed, keep splint on until given further recommendations by orthopedics.  I have given you some resources for a local orthopedic group, however you can call whoever you feel most comfortable with to make your appointment

## 2023-02-14 ENCOUNTER — Encounter: Payer: Self-pay | Admitting: Orthopaedic Surgery

## 2023-02-14 ENCOUNTER — Ambulatory Visit: Payer: Medicare Other | Admitting: Orthopaedic Surgery

## 2023-02-14 VITALS — BP 138/75 | HR 57 | Ht 69.0 in | Wt 205.0 lb

## 2023-02-14 DIAGNOSIS — S62115A Nondisplaced fracture of triquetrum [cuneiform] bone, left wrist, initial encounter for closed fracture: Secondary | ICD-10-CM | POA: Diagnosis not present

## 2023-02-14 NOTE — Progress Notes (Signed)
Subjective:    Patient ID: Bruce Lodge., male    DOB: Feb 14, 1951, 72 y.o.   MRN: [id number]  HPI He fell at basketball game at [employer] on 02-09-23.  He hurt his left dominant wrist.  He was seen in ER the following day and X-rays showed: IMPRESSION: Triquetral fracture.  He was in a small splint that is a little tight for him.  He has no other injury.   Review of Systems  Constitutional:  Positive for activity change.  Musculoskeletal:  Positive for arthralgias and joint swelling.  All other systems reviewed and are negative. For Review of Systems, all other systems reviewed and are negative.  The following is a summary of the past history medically, past history surgically, known current medicines, social history and family history.  This information is gathered electronically by the computer from prior information and documentation.  I review this each visit and have found including this information at this point in the chart is beneficial and informative.   Past Medical History:  Diagnosis Date   Adenoma 11/03/2009   1.1 cm from TCS   DM (diabetes mellitus) (HCC)    GERD (gastroesophageal reflux disease)    High cholesterol    History of kidney stones    HTN (hypertension)    Kidney failure    stage 4   Kidney stones    Obesity (BMI 30-39.9) DEC 2011 223 LBS   Rectal bleeding 12/04/2009   secondary to hemorrhoids    Past Surgical History:  Procedure Laterality Date   CIRCUMCISION     COLONOSCOPY  Dec 2010 [facility]   MOD IH, SIMPLE ADENOMA 1.1 CM   COLONOSCOPY  11/25/2012   SLF: 1. Sessile polyp measuring 91m in size was found at the hepatic flesure; polypectomy was performed using snare cautery 2. Moderate diverticulosis was noted in the ascending colon and sigmoid colon. 3. The colon mucosa was otherwise normal 4. Large internal hemorrhoids.    COLONOSCOPY N/A 03/01/2017   Procedure: COLONOSCOPY;  Surgeon: SDanie Binder MD;  [facility];  Service: Endoscopy;  Laterality: N/A;  10:30 AM   COLONOSCOPY WITH PROPOFOL N/A 04/14/2022   Procedure: COLONOSCOPY WITH PROPOFOL;  Surgeon: CEloise Harman DO;  [facility];  Service: Endoscopy;  Laterality: N/A;  11:00am   EYE SURGERY  05/2018   bilateral cataract surgery   HERNIA REPAIR     POLYPECTOMY  04/14/2022   Procedure: POLYPECTOMY;  Surgeon: CEloise Harman DO;  [facility];  Service: Endoscopy;;    Current Outpatient Medications on File Prior to Visit  Medication Sig Dispense Refill   acetaminophen (TYLENOL) 325 MG tablet Take 2 tablets (650 mg total) by mouth every 6 (six) hours as needed for moderate pain. 30 tablet 0   albuterol (VENTOLIN HFA) 108 (90 Base) MCG/ACT inhaler Inhale 2 puffs into the lungs every 4 (four) hours as needed for wheezing or shortness of breath. 18 g 0   allopurinol (ZYLOPRIM) 100 MG tablet Take 300 mg by mouth daily.      amLODipine (NORVASC) 10 MG tablet TAKE 1 TABLET BY MOUTH EVERYDAY AT BEDTIME 90 tablet 3   atorvastatin (LIPITOR) 10 MG tablet TAKE 1 TABLET BY MOUTH EVERY DAY (NEED PHYSICAL) 90 tablet 1   azelastine (ASTELIN) 0.1 % nasal spray Place 1 spray into both nostrils 2 (two) times daily. Use in each nostril as directed 30 mL 0   calcitRIOL (ROCALTROL)  0.25 MCG capsule Take 0.25 mcg by mouth at bedtime.      clomiPHENE (CLOMID) 50 MG tablet Take 25 mg by mouth at bedtime.      famciclovir (FAMVIR) 500 MG tablet Take 1,000 mg by mouth 2 (two) times daily as needed (fever blisters).  5   famotidine (PEPCID) 40 MG tablet TAKE 1 TABLET (40 MG TOTAL) BY MOUTH DAILY. WITH BREAKFAST 90 tablet 3   fexofenadine (ALLEGRA ALLERGY) 60 MG tablet Take 1 tablet (60 mg total) by mouth daily. 90 tablet 1   fluticasone (FLONASE) 50 MCG/ACT nasal spray Place 1 spray into both nostrils 2 (two) times daily. 16 g 2   furosemide (LASIX) 40 MG tablet Take 40 mg by mouth daily.     gabapentin (NEURONTIN) 100 MG capsule TAKE 1  CAPSULE BY MOUTH THREE TIMES A DAY 90 capsule 2   hydrALAZINE (APRESOLINE) 25 MG tablet Take 25 mg by mouth 3 (three) times daily.     promethazine-dextromethorphan (PROMETHAZINE-DM) 6.25-15 MG/5ML syrup Take 5 mLs by mouth 4 (four) times daily as needed. 100 mL 0   propranolol (INDERAL) 80 MG tablet TAKE 1 TABLET BY MOUTH TWICE A DAY 180 tablet 0   rosuvastatin (CRESTOR) 5 MG tablet Take 5 mg by mouth daily.     tamsulosin (FLOMAX) 0.4 MG CAPS capsule Take 0.4 mg by mouth daily.     traZODone (DESYREL) 50 MG tablet TAKE 1 TABLET BY MOUTH EVERYDAY AT BEDTIME 90 tablet 2   fluticasone (FLONASE) 50 MCG/ACT nasal spray Place 1 spray into both nostrils daily for 14 days. (Patient taking differently: Place 1 spray into both nostrils at bedtime as needed for allergies.) 16 g 0   [DISCONTINUED] cetirizine (ZYRTEC ALLERGY) 10 MG tablet Take 1 tablet (10 mg total) by mouth daily. 30 tablet 0   No current facility-administered medications on file prior to visit.    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 MARRIED TO 2ND WIFE.      02/06/2019: Lives with mother at this time assisting in her care, while wife lives nearby, taking care of her own parents.   Works from home full time, but planning  on retiring in next month to focus on health/well-being   Used to work out on treadmill, but has not in recent months   Motivated to return to exercise regimine and better diet control   Enjoys watching basketball, football      Social Determinants of Health   Financial Resource Strain: Low Risk  (08/09/2022)   Overall Financial Resource Strain (CARDIA)    Difficulty of Paying Living Expenses:  Not hard at all  Food Insecurity: No Food Insecurity (08/09/2022)   Hunger Vital Sign    Worried About Running Out of Food in the Last Year: Never true    Ran Out of Food in the Last Year: Never true  Transportation Needs: No Transportation Needs (08/09/2022)   PRAPARE - Hydrologist (Medical): No    Lack of Transportation (Non-Medical): No  Physical Activity: Inactive (08/09/2022)   Exercise Vital Sign    Days of Exercise per Week: 0 days    Minutes of Exercise per Session: 0 min  Stress: No Stress Concern Present (08/09/2022)   Faith    Feeling of Stress : Not at all  Social Connections: [place] (08/09/2022)   Social Connection and Isolation Panel [NHANES]    Frequency of Communication with Friends and Family: More than three times a week    Frequency of Social Gatherings with Friends and Family: More than three times a week    Attends Religious Services: More than 4 times per year    Active Member of [employer] or Organizations: Yes    Attends Music therapist: More than 4 times per year    Marital Status: Married  Human resources officer Violence: Not At Risk (08/09/2022)   Humiliation, Afraid, Rape, and Kick questionnaire    Fear of Current or Ex-Partner: No    Emotionally Abused: No    Physically Abused: No    Sexually Abused: No    Family History  Problem Relation Age of Onset   Lung cancer Father    High blood pressure Father    Colon cancer Neg Hx    Colon polyps Neg Hx      BP 138/75   Pulse (!) 57   Ht '5\' 9"'$  (1.753 m)   Wt 205 lb (93 kg)   BMI 30.27 kg/m   Body mass index is 30.27 kg/m.      Objective:   Physical Exam Vitals and nursing note reviewed. Exam conducted with a chaperone present.  Constitutional:      Appearance: He is well-developed.  HENT:     Head: Normocephalic and atraumatic.  Eyes:     Conjunctiva/sclera: Conjunctivae normal.     Pupils: Pupils are equal, round, and reactive to light.  Cardiovascular:     Rate and Rhythm: Normal rate and regular rhythm.  Pulmonary:     Effort: Pulmonary effort is normal.  Abdominal:     Palpations: Abdomen is soft.  Musculoskeletal:       Hands:     Cervical back: Normal range of motion and neck supple.  Skin:    General: Skin is warm and dry.  Neurological:     Mental Status: He is alert and oriented to person, place, and time.     Cranial Nerves: No cranial nerve deficit.     Motor: No abnormal muscle tone.     Coordination: Coordination normal.     Deep Tendon Reflexes: Reflexes are normal and symmetric. Reflexes normal.  Psychiatric:        Behavior: Behavior normal.        Thought Content: Thought content normal.        Judgment: Judgment normal.   I have independently reviewed and interpreted x-rays of this patient done at another site by another physician or qualified health professional. I have reviewed the ER notes.        Assessment &  Plan:   Encounter Diagnosis  Name Primary?   Nondisplaced fracture of triquetrum (cuneiform) bone, left wrist, initial encounter for closed fracture Yes   I have given cock-up splint and explained use.  Return in two weeks.  X-rays then.  Call if any problem.  Precautions discussed.  Electronically Signed Sanjuana Kava, MD 3/13/20249:43 AM

## 2023-02-24 ENCOUNTER — Other Ambulatory Visit: Payer: Self-pay | Admitting: Orthopedic Surgery

## 2023-02-24 ENCOUNTER — Other Ambulatory Visit: Payer: Self-pay | Admitting: Family Medicine

## 2023-02-24 DIAGNOSIS — I10 Essential (primary) hypertension: Secondary | ICD-10-CM

## 2023-02-24 DIAGNOSIS — M545 Low back pain, unspecified: Secondary | ICD-10-CM

## 2023-02-26 ENCOUNTER — Other Ambulatory Visit: Payer: Self-pay | Admitting: Orthopedic Surgery

## 2023-02-26 DIAGNOSIS — M545 Low back pain, unspecified: Secondary | ICD-10-CM

## 2023-02-28 ENCOUNTER — Encounter: Payer: Self-pay | Admitting: Orthopaedic Surgery

## 2023-02-28 ENCOUNTER — Other Ambulatory Visit (INDEPENDENT_AMBULATORY_CARE_PROVIDER_SITE_OTHER): Payer: Medicare Other

## 2023-02-28 ENCOUNTER — Ambulatory Visit: Payer: Medicare Other | Admitting: Orthopaedic Surgery

## 2023-02-28 VITALS — BP 149/74 | HR 52 | Ht 70.0 in | Wt 204.4 lb

## 2023-02-28 DIAGNOSIS — S62115D Nondisplaced fracture of triquetrum [cuneiform] bone, left wrist, subsequent encounter for fracture with routine healing: Secondary | ICD-10-CM

## 2023-02-28 DIAGNOSIS — S62115A Nondisplaced fracture of triquetrum [cuneiform] bone, left wrist, initial encounter for closed fracture: Secondary | ICD-10-CM | POA: Diagnosis not present

## 2023-02-28 NOTE — Progress Notes (Signed)
I am a little better.  He has less pain of the left wrist. He has been using the cock-up splint.  NV intact. ROM of the left wrist is full but tender dorsally.  X-rays were done of the left wrist, reported separately.  Encounter Diagnosis  Name Primary?   Nondisplaced fracture of triquetrum (cuneiform) bone, left wrist, subsequent encounter for fracture with routine healing Yes   He can slowly come out of the splint.  Return in two weeks.  X-rays then of the left wrist.  Call if any problem.  Precautions discussed.  Electronically Signed Sanjuana Kava, MD 3/27/20249:33 AM

## 2023-03-07 ENCOUNTER — Other Ambulatory Visit: Payer: Self-pay | Admitting: Family Medicine

## 2023-03-07 NOTE — Telephone Encounter (Signed)
Requested Prescriptions  Pending Prescriptions Disp Refills   fluticasone (FLONASE) 50 MCG/ACT nasal spray [Pharmacy Med Name: FLUTICASONE PROP 50 MCG SPRAY] 48 mL     Sig: PLACE 1 SPRAY INTO BOTH NOSTRILS 2 (TWO) TIMES DAILY     There is no refill protocol information for this order      

## 2023-03-12 ENCOUNTER — Telehealth: Payer: Self-pay | Admitting: Orthopaedic Surgery

## 2023-03-12 DIAGNOSIS — M545 Low back pain, unspecified: Secondary | ICD-10-CM

## 2023-03-12 MED ORDER — GABAPENTIN 100 MG PO CAPS: ORAL_CAPSULE | ORAL | 2 refills | Status: DC

## 2023-03-12 NOTE — Telephone Encounter (Signed)
Seeing Dr. Sabino Niemann - pt is requesting a refill on Gabapentin 100mg , [facility].

## 2023-03-14 ENCOUNTER — Other Ambulatory Visit (INDEPENDENT_AMBULATORY_CARE_PROVIDER_SITE_OTHER): Payer: Medicare Other

## 2023-03-14 ENCOUNTER — Encounter: Payer: Self-pay | Admitting: Orthopaedic Surgery

## 2023-03-14 ENCOUNTER — Ambulatory Visit: Payer: Medicare Other | Admitting: Orthopaedic Surgery

## 2023-03-14 DIAGNOSIS — S62115D Nondisplaced fracture of triquetrum [cuneiform] bone, left wrist, subsequent encounter for fracture with routine healing: Secondary | ICD-10-CM | POA: Diagnosis not present

## 2023-03-14 NOTE — Progress Notes (Signed)
I feel good.  He has no pain of the left wrist now.  He has been using the cock-up splint.  ROM of the left wrist is full.  NV intact, no swelling, no pain.  X-rays were done of the left wrist, reported separately.  Encounter Diagnosis  Name Primary?   Nondisplaced fracture of triquetrum (cuneiform) bone, left wrist, subsequent encounter for fracture with routine healing Yes   Come out of splint.  Return in two weeks.  Call and cancel if doing well.  Call if any problem.  Precautions discussed.  Electronically Signed Darreld Mclean, MD 4/10/20249:50 AM

## 2023-03-28 ENCOUNTER — Encounter: Payer: Self-pay | Admitting: Orthopaedic Surgery

## 2023-03-28 ENCOUNTER — Ambulatory Visit (INDEPENDENT_AMBULATORY_CARE_PROVIDER_SITE_OTHER): Payer: Medicare Other | Admitting: Orthopaedic Surgery

## 2023-03-28 DIAGNOSIS — S62115D Nondisplaced fracture of triquetrum [cuneiform] bone, left wrist, subsequent encounter for fracture with routine healing: Secondary | ICD-10-CM

## 2023-03-28 NOTE — Progress Notes (Signed)
I am fine  He has no pain of the left wrist and full ROM.  NV intact.  Encounter Diagnosis  Name Primary?   Nondisplaced fracture of triquetrum (cuneiform) bone, left wrist, subsequent encounter for fracture with routine healing Yes   I will see prn.  Call if any problem.  Precautions discussed.  Electronically Signed Darreld Mclean, MD 4/24/20249:36 AM

## 2023-04-01 ENCOUNTER — Other Ambulatory Visit: Payer: Self-pay | Admitting: Family Medicine

## 2023-04-13 ENCOUNTER — Other Ambulatory Visit: Payer: Self-pay | Admitting: Family Medicine

## 2023-05-23 ENCOUNTER — Other Ambulatory Visit: Payer: Self-pay | Admitting: Family Medicine

## 2023-05-23 DIAGNOSIS — I10 Essential (primary) hypertension: Secondary | ICD-10-CM

## 2023-06-20 LAB — LAB REPORT - SCANNED: EGFR: 10

## 2023-06-27 ENCOUNTER — Ambulatory Visit (INDEPENDENT_AMBULATORY_CARE_PROVIDER_SITE_OTHER): Payer: Medicare Other | Admitting: Internal Medicine

## 2023-06-27 ENCOUNTER — Encounter: Payer: Self-pay | Admitting: Internal Medicine

## 2023-06-27 VITALS — BP 135/68 | HR 62 | Temp 97.5°F | Ht 69.0 in | Wt 206.1 lb

## 2023-06-27 DIAGNOSIS — R151 Fecal smearing: Secondary | ICD-10-CM | POA: Diagnosis not present

## 2023-06-27 DIAGNOSIS — K219 Gastro-esophageal reflux disease without esophagitis: Secondary | ICD-10-CM

## 2023-06-27 DIAGNOSIS — D123 Benign neoplasm of transverse colon: Secondary | ICD-10-CM | POA: Diagnosis not present

## 2023-06-27 DIAGNOSIS — K648 Other hemorrhoids: Secondary | ICD-10-CM | POA: Diagnosis not present

## 2023-06-27 NOTE — Patient Instructions (Signed)
We will schedule you for hemorrhoid banding with Bruce Weber.  Recommend limiting your toilet time for BMs to less than 5 minutes.  Continue Preparation H as needed.  It was very nice seeing you again today.  Dr. Marletta Weber

## 2023-06-27 NOTE — Progress Notes (Signed)
Referring Provider: Deeann Saint, MD Primary Care Physician:  Deeann Saint, MD Primary GI:  Dr. Marletta Lor  Chief Complaint  Patient presents with   Hemorrhoids    Patient here today due to painful and irritated hemorrhoids. Patient has seen some bright red blood recently. He is using prep H suppositories which has helped. He has had a banding in the past with Dr. Darrick Penna per patient.       HPI:   Bruce Weber. is a 72 y.o. male who presents to the clinic today for yearly follow-up.  Medical history includes end-stage renal disease awaiting transplant, not on hemodialysis.  OSA, obesity, hypertension.    He has chronic reflux which is well maintained on famotidine 40 mg daily.  No dysphagia or odynophagia.  No unintentional weight loss.  No abdominal pain.  No melena hematochezia.  Does have history of adenomatous colon polyps.  Last colonoscopy 04/14/22 with multiple tubular adenomas and sessile serrated adenomas removed.  Colonoscopy recall 2028.  Today main complaint for me is issues with hemorrhoids.  Has had 1 episode of bleeding.  Mainly concerned as he is having stool leakage intermittently throughout the day.  Currently wearing depends.  No rectal pain or discomfort.  Has been taking Preparation H which has helped some.  Does note spending a lot of time on the toilet reading the news and social media.  Past Medical History:  Diagnosis Date   Adenoma 11/03/2009   1.1 cm from TCS   DM (diabetes mellitus) (HCC)    GERD (gastroesophageal reflux disease)    High cholesterol    History of kidney stones    HTN (hypertension)    Kidney failure    stage 4   Kidney stones    Obesity (BMI 30-39.9) DEC 2011 223 LBS   Rectal bleeding 12/04/2009   secondary to hemorrhoids    Past Surgical History:  Procedure Laterality Date   CIRCUMCISION     COLONOSCOPY  Dec 2010 [facility]   MOD IH, SIMPLE ADENOMA 1.1 CM   COLONOSCOPY  11/25/2012   SLF: 1. Sessile polyp measuring 6mm in  size was found at the hepatic flesure; polypectomy was performed using snare cautery 2. Moderate diverticulosis was noted in the ascending colon and sigmoid colon. 3. The colon mucosa was otherwise normal 4. Large internal hemorrhoids.    COLONOSCOPY N/A 03/01/2017   Procedure: COLONOSCOPY;  Surgeon: West Bali, MD;  [facility];  Service: Endoscopy;  Laterality: N/A;  10:30 AM   COLONOSCOPY WITH PROPOFOL N/A 04/14/2022   Procedure: COLONOSCOPY WITH PROPOFOL;  Surgeon: Lanelle Bal, DO;  [facility];  Service: Endoscopy;  Laterality: N/A;  11:00am   EYE SURGERY  05/2018   bilateral cataract surgery   HERNIA REPAIR     POLYPECTOMY  04/14/2022   Procedure: POLYPECTOMY;  Surgeon: Lanelle Bal, DO;  [facility]: [facility];  Service: Endoscopy;;    Current Outpatient Medications  Medication Sig Dispense Refill   acetaminophen (TYLENOL) 325 MG tablet Take 2 tablets (650 mg total) by mouth every 6 (six) hours as needed for moderate pain. 30 tablet 0   albuterol (VENTOLIN HFA) 108 (90 Base) MCG/ACT inhaler Inhale 2 puffs into the lungs every 4 (four) hours as needed for wheezing or shortness of breath. 18 g 0   allopurinol (ZYLOPRIM) 100 MG tablet Take 300 mg by mouth daily.      amLODipine (NORVASC) 10 MG tablet TAKE 1  TABLET BY MOUTH EVERYDAY AT BEDTIME 90 tablet 3   atorvastatin (LIPITOR) 10 MG tablet TAKE 1 TABLET BY MOUTH EVERY DAY (NEED PHYSICAL) 90 tablet 1   calcitRIOL (ROCALTROL) 0.25 MCG capsule Take 0.25 mcg by mouth at bedtime.      famotidine (PEPCID) 40 MG tablet TAKE 1 TABLET (40 MG TOTAL) BY MOUTH DAILY. WITH BREAKFAST 90 tablet 3   fluticasone (FLONASE) 50 MCG/ACT nasal spray Place 1 spray into both nostrils daily for 14 days. (Patient taking differently: Place 1 spray into both nostrils at bedtime as needed for allergies.) 16 g 0   furosemide (LASIX) 40 MG tablet Take 40 mg by mouth daily.     gabapentin (NEURONTIN) 100 MG capsule TAKE 1 CAPSULE BY  MOUTH THREE TIMES A DAY 90 capsule 2   hydrALAZINE (APRESOLINE) 25 MG tablet TAKE 1 (ONE) TABLET THREE TIMES DAILY 270 tablet 3   promethazine-dextromethorphan (PROMETHAZINE-DM) 6.25-15 MG/5ML syrup Take 5 mLs by mouth 4 (four) times daily as needed. 100 mL 0   propranolol (INDERAL) 80 MG tablet TAKE 1 TABLET BY MOUTH TWICE A DAY 180 tablet 0   rosuvastatin (CRESTOR) 5 MG tablet Take 5 mg by mouth daily.     tamsulosin (FLOMAX) 0.4 MG CAPS capsule Take 0.4 mg by mouth daily.     traZODone (DESYREL) 50 MG tablet TAKE 1 TABLET BY MOUTH EVERYDAY AT BEDTIME 90 tablet 2   No current facility-administered medications for this visit.    Allergies as of 06/27/2023 - Review Complete 06/27/2023  Allergen Reaction Noted   Dye fdc blue [brilliant blue fcf (fd&c blue #1)]  06/12/2011   Iodinated contrast media Hives 06/12/2011    Family History  Problem Relation Age of Onset   Lung cancer Father    High blood pressure Father    Colon cancer Neg Hx    Colon polyps Neg Hx     Social History   Socioeconomic History   Marital status: Married    Spouse name: Not on file   Number of children: 0   Years of education: Not on file   Highest education level: Not on file  Occupational History   Occupation: Surveyor, minerals for [employer]    Comment: retired   Occupation: Surveyor, minerals    Comment: full time from home   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 MARRIED TO 2ND WIFE.      02/06/2019: Lives with mother at this time assisting in her care, while wife lives nearby, taking care of her own parents.   Works from home  full time, but planning on retiring in next month to focus on health/well-being   Used to work out on treadmill, but has not in recent months   Motivated to return to exercise regimine and better diet control   Enjoys watching basketball, football      Social Determinants of Health   Financial Resource Strain: Low Risk  (08/09/2022)   Overall Financial Resource Strain (CARDIA)    Difficulty of Paying Living  Expenses: Not hard at all  Food Insecurity: No Food Insecurity (08/09/2022)   Hunger Vital Sign    Worried About Running Out of Food in the Last Year: Never true    Ran Out of Food in the Last Year: Never true  Transportation Needs: No Transportation Needs (08/09/2022)   PRAPARE - Administrator, Civil Service (Medical): No    Lack of Transportation (Non-Medical): No  Physical Activity: Inactive (08/09/2022)   Exercise Vital Sign    Days of Exercise per Week: 0 days    Minutes of Exercise per Session: 0 min  Stress: No Stress Concern Present (08/09/2022)   [employer] of Occupational Health - Occupational Stress Questionnaire    Feeling of Stress : Not at all  Social Connections: Socially Integrated (08/09/2022)   Social Connection and Isolation Panel [NHANES]    Frequency of Communication with Friends and Family: More than three times a week    Frequency of Social Gatherings with Friends and Family: More than three times a week    Attends Religious Services: More than 4 times per year    Active Member of [employer] or Organizations: Yes    Attends Engineer, structural: More than 4 times per year    Marital Status: Married    Subjective: Review of Systems  Constitutional:  Negative for chills and fever.  HENT:  Negative for congestion and hearing loss.   Eyes:  Negative for blurred vision and double vision.  Respiratory:  Negative for cough and shortness of breath.   Cardiovascular:  Negative for chest pain and palpitations.  Gastrointestinal:  Positive for  heartburn. Negative for abdominal pain, blood in stool, constipation, diarrhea, melena and vomiting.  Genitourinary:  Negative for dysuria and urgency.  Musculoskeletal:  Negative for joint pain and myalgias.  Skin:  Negative for itching and rash.  Neurological:  Negative for dizziness and headaches.  Psychiatric/Behavioral:  Negative for depression. The patient is not nervous/anxious.      Objective: BP 135/68 (BP Location: Left Arm, Patient Position: Sitting, Cuff Size: Large)   Pulse 62   Temp (!) 97.5 F (36.4 C) (Temporal)   Ht 5\' 9"  (1.753 m)   Wt 206 lb 1.6 oz (93.5 kg)   BMI 30.44 kg/m  Physical Exam Constitutional:      Appearance: Normal appearance.  HENT:     Head: Normocephalic and atraumatic.  Eyes:     Extraocular Movements: Extraocular movements intact.     Conjunctiva/sclera: Conjunctivae normal.  Cardiovascular:     Rate and Rhythm: Normal rate and regular rhythm.     Heart sounds: Murmur heard.  Pulmonary:     Effort: Pulmonary effort is normal.     Breath sounds: Normal breath sounds.  Abdominal:     General: Bowel sounds are normal.     Palpations: Abdomen is soft.  Musculoskeletal:        General: Normal range of motion.     Cervical back: Normal range of motion and neck supple.  Skin:    General: Skin is warm.  Neurological:     General: No focal deficit present.     Mental Status: He is alert and oriented to person, place, and time.  Psychiatric:        Mood and Affect: Mood normal.        Behavior: Behavior normal.    Rectal exam: No palpable masses.  1 prolapsed internal hemorrhoid noted.  No perianal excoriation or inflammation.  Sphincter  tone mildly impaired.  Assessment: *Chronic reflux-well controlled *Adenomatous colon polyp *Stool leakage *Internal hemorrhoids  Plan: Discussed reflux in depth with patient today.  Well controlled. Continue on Famotine.   Colonoscopy recall 2028 for adenomatous colon polyps.  Will schedule for  hemorrhoid banding for treatment of his internal hemorrhoids.  Counseled on limiting toilet time during bowel movements to less than 5 minutes.  Recommended over-the-counter fiber supplementation.   06/27/2023 11:47 AM   Disclaimer: This note was dictated with voice recognition software. Similar sounding words can inadvertently be transcribed and may not be corrected upon review.

## 2023-07-06 ENCOUNTER — Other Ambulatory Visit: Payer: Self-pay | Admitting: Family Medicine

## 2023-07-08 ENCOUNTER — Telehealth: Payer: Self-pay | Admitting: Orthopaedic Surgery

## 2023-07-08 DIAGNOSIS — M545 Low back pain, unspecified: Secondary | ICD-10-CM

## 2023-07-12 ENCOUNTER — Encounter: Payer: Self-pay | Admitting: Gastroenterology

## 2023-07-12 ENCOUNTER — Ambulatory Visit: Payer: Medicare Other | Admitting: Gastroenterology

## 2023-07-12 VITALS — BP 151/79 | HR 58 | Temp 97.5°F | Ht 69.0 in | Wt 203.6 lb

## 2023-07-12 DIAGNOSIS — K648 Other hemorrhoids: Secondary | ICD-10-CM | POA: Diagnosis not present

## 2023-07-12 NOTE — Patient Instructions (Signed)
  Please avoid straining.  You should limit your toilet time to 2-3 minutes at the most.   I recommend Benefiber 2 teaspoons each morning in the beverage of your choice!  Please call me with any concerns or issues!  I will see you in follow-up for additional banding in several weeks.     It was a pleasure to see you today. I want to create trusting relationships with patients and provide genuine, compassionate, and quality care. I truly value your feedback, so please be on the lookout for a survey regarding your visit with me today. I appreciate your time in completing this!    Annitta Needs, PhD, ANP-BC [facility]

## 2023-07-12 NOTE — Progress Notes (Signed)
      [facility] BANDING PROCEDURE NOTE  Bruce Weber. is a 72 y.o. male presenting today for consideration of hemorrhoid banding. Last colonoscopy 04/14/22 with multiple tubular adenomas and sessile serrated adenomas removed.  Colonoscopy recall 2028. He has had itching, bleeding, burning, and soiling.     The patient presents with symptomatic grade 2 hemorrhoids, unresponsive to maximal medical therapy, requesting rubber band ligation of his hemorrhoidal disease. All risks, benefits, and alternative forms of therapy were described and informed consent was obtained.  The decision was made to band the left lateral internal hemorrhoid, and the [facility] O'Regan System was used to perform band ligation without complication. Digital anorectal examination was then performed to assure proper positioning of the band, and to adjust the banded tissue as required. The patient was discharged home without pain or other issues. Dietary and behavioral recommendations were given, along with follow-up instructions. The patient will return in several weeks for followup and possible additional banding as required.  No complications were encountered and the patient tolerated the procedure well.   Gelene Mink, PhD, ANP-BC [facility]

## 2023-07-19 ENCOUNTER — Encounter (INDEPENDENT_AMBULATORY_CARE_PROVIDER_SITE_OTHER): Payer: Self-pay

## 2023-07-26 ENCOUNTER — Ambulatory Visit: Payer: Medicare Other | Admitting: Gastroenterology

## 2023-07-26 ENCOUNTER — Encounter: Payer: Self-pay | Admitting: Gastroenterology

## 2023-07-26 VITALS — BP 143/75 | HR 50 | Temp 97.8°F | Ht 70.0 in | Wt 202.8 lb

## 2023-07-26 DIAGNOSIS — K648 Other hemorrhoids: Secondary | ICD-10-CM | POA: Diagnosis not present

## 2023-07-26 NOTE — Progress Notes (Signed)
      [facility] BANDING PROCEDURE NOTE  Chantz Jonette Mate. is a delightful 72 y.o. male presenting today for consideration of hemorrhoid banding. Last colonoscopy 04/14/22 with multiple tubular adenomas and sessile serrated adenomas removed.  Colonoscopy recall 2028. He has had itching, bleeding, burning, and soiling. He notes improvement in this. Left lateral banded at last visit.    The patient presents with symptomatic grade 2 hemorrhoids, unresponsive to maximal medical therapy, requesting rubber band ligation of his hemorrhoidal disease. All risks, benefits, and alternative forms of therapy were described and informed consent was obtained.   The decision was made to band the right posterior internal hemorrhoid, and the [facility] O'Regan System was used to perform band ligation without complication. Digital anorectal examination was then performed to assure proper positioning of the band, and to adjust the banded tissue as required. The patient was discharged home without pain or other issues. Dietary and behavioral recommendations were given, along with follow-up instructions. The patient will return in several weeks for followup and possible additional banding as required.  No complications were encountered and the patient tolerated the procedure well.   Gelene Mink, PhD, ANP-BC [facility]

## 2023-07-26 NOTE — Patient Instructions (Signed)
  Please avoid straining.  You should limit your toilet time to 2-3 minutes at the most.   I recommend Benefiber 2 teaspoons each morning in the beverage of your choice!  Please call me with any concerns or issues!  I will see you in follow-up for additional banding in several weeks.   I enjoyed seeing you again today! I value our relationship and want to provide genuine, compassionate, and quality care. You may receive a survey regarding your visit with me, and I welcome your feedback! Thanks so much for taking the time to complete this. I look forward to seeing you again.      Anna W. Boone, PhD, ANP-BC [facility]       

## 2023-08-08 ENCOUNTER — Encounter: Payer: Self-pay | Admitting: Gastroenterology

## 2023-08-08 ENCOUNTER — Ambulatory Visit (INDEPENDENT_AMBULATORY_CARE_PROVIDER_SITE_OTHER): Payer: Medicare Other | Admitting: Gastroenterology

## 2023-08-08 VITALS — BP 145/77 | HR 48 | Temp 97.1°F | Ht 70.0 in | Wt 201.5 lb

## 2023-08-08 DIAGNOSIS — K648 Other hemorrhoids: Secondary | ICD-10-CM | POA: Diagnosis not present

## 2023-08-08 NOTE — Progress Notes (Signed)
    [facility] BANDING PROCEDURE NOTE  Bruce Weber. is a delightful 72 y.o. male presenting today for consideration of hemorrhoid banding. Last colonoscopy 04/14/22 with multiple tubular adenomas and sessile serrated adenomas removed. Colonoscopy recall 2028. He has had left lateral and right posterior banding. Notes improvement. Still with some scant bleeding and prolapse.    The patient presents with symptomatic grade 2 hemorrhoids, unresponsive to maximal medical therapy, requesting rubber band ligation of his hemorrhoidal disease. All risks, benefits, and alternative forms of therapy were described and informed consent was obtained.   The decision was made to band the right anterior internal hemorrhoid, and the [facility] O'Regan System was used to perform band ligation without complication. Digital anorectal examination was then performed to assure proper positioning of the band, and to adjust the banded tissue as required. The patient was discharged home without pain or other issues. Dietary and behavioral recommendations were given, along with follow-up instructions. The patient will return as needed. He may ultimately need a neutral banding.   No complications were encountered and the patient tolerated the procedure well.   Gelene Mink, PhD, ANP-BC [facility]

## 2023-08-08 NOTE — Patient Instructions (Signed)
  Please avoid straining.  You should limit your toilet time to 2-3 minutes at the most.   I recommend Benefiber 2 teaspoons each morning in the beverage of your choice!  Please call me with any concerns or issues!  I will see you in follow-up as needed!  I enjoyed seeing you again today! I value our relationship and want to provide genuine, compassionate, and quality care. You may receive a survey regarding your visit with me, and I welcome your feedback! Thanks so much for taking the time to complete this. I look forward to seeing you again.      Gelene Mink, PhD, ANP-BC [facility]

## 2023-08-14 ENCOUNTER — Ambulatory Visit (INDEPENDENT_AMBULATORY_CARE_PROVIDER_SITE_OTHER): Payer: Medicare Other | Admitting: Family Medicine

## 2023-08-14 VITALS — Wt 196.0 lb

## 2023-08-14 DIAGNOSIS — Z0000 Encounter for general adult medical examination without abnormal findings: Secondary | ICD-10-CM

## 2023-08-14 LAB — LAB REPORT - SCANNED
Creatinine, POC: 75.8 mg/dL
EGFR: 9

## 2023-08-14 NOTE — Patient Instructions (Signed)
I really enjoyed getting to talk with you today! I am available on Tuesdays and Thursdays for virtual visits if you have any questions or concerns, or if I can be of any further assistance.   CHECKLIST FROM ANNUAL WELLNESS VISIT:  -Follow up (please call to schedule if not scheduled after visit):   -yearly for annual wellness visit with primary care office  Here is a list of your preventive care/health maintenance measures and the plan for each if any are due:  PLAN For any measures below that may be due:  -please see what vaccines you had at the pharmacy and let us know -get the updated covid and flu shots -schedule your diabetic eye exam  Health Maintenance  Topic Date Due   Diabetic kidney evaluation - Urine ACR  Never done   Hepatitis C Screening  Never done   DTaP/Tdap/Td (1 - Tdap) Never done   Zoster Vaccines- Shingrix (1 of 2) 07/03/1999   OPHTHALMOLOGY EXAM  10/04/2022   FOOT EXAM  11/24/2022   HEMOGLOBIN A1C  05/25/2023   INFLUENZA VACCINE  07/05/2023   COVID-19 Vaccine (6 - 2023-24 season) 08/05/2023   Diabetic kidney evaluation - eGFR measurement  06/19/2024   Medicare Annual Wellness (AWV)  08/13/2024   Colonoscopy  04/14/2032   Pneumonia Vaccine 36+ Years old  Completed   HPV VACCINES  Aged Out    -See a dentist at least yearly  -Get your eyes checked and then per your eye specialist's recommendations  -Other issues addressed today:   -I have included below further information regarding a healthy whole foods based diet, physical activity guidelines for adults, stress management and opportunities for social connections. I hope you find this information useful.   -----------------------------------------------------------------------------------------------------------------------------------------------------------------------------------------------------------------------------------------------------------  NUTRITION: -eat real food: lots of colorful  vegetables (half the plate) and fruits -5-7 servings of vegetables and fruits per day (fresh or steamed is best), exp. 2 servings of vegetables with lunch and dinner and 2 servings of fruit per day. Berries and greens such as kale and collards are great choices.  -consume on a regular basis: whole grains (make sure first ingredient on label contains the word "whole"), fresh fruits, fish, nuts, seeds, healthy oils (such as olive oil, avocado oil, grape seed oil) -may eat small amounts of dairy and lean meat on occasion, but avoid processed meats such as ham, bacon, lunch meat, etc. -drink water -try to avoid fast food and pre-packaged foods, processed meat -most experts advise limiting sodium to < 2300mg  per day, should limit further is any chronic conditions such as high blood pressure, heart disease, diabetes, etc. The American Heart Association advised that < 1500mg  is is ideal -try to avoid foods that contain any ingredients with names you do not recognize  -try to avoid sugar/sweets (except for the natural sugar that occurs in fresh fruit) -try to avoid sweet drinks -try to avoid white rice, white bread, pasta (unless whole grain), white or yellow potatoes  EXERCISE GUIDELINES FOR ADULTS: -if you wish to increase your physical activity, do so gradually and with the approval of your doctor -STOP and seek medical care immediately if you have any chest pain, chest discomfort or trouble breathing when starting or increasing exercise  -move and stretch your body, legs, feet and arms when sitting for long periods -Physical activity guidelines for optimal health in adults: -least 150 minutes per week of aerobic exercise (can talk, but not sing) once approved by your doctor, 20-30 minutes of sustained activity or  two 10 minute episodes of sustained activity every day.  -resistance training at least 2 days per week if approved by your doctor -balance exercises 3+ days per week:   Stand somewhere where  you have something sturdy to hold onto if you lose balance.    1) lift up on toes, start with 5x per day and work up to 20x   2) stand and lift on leg straight out to the side so that foot is a few inches of the floor, start with 5x each side and work up to 20x each side   3) stand on one foot, start with 5 seconds each side and work up to 20 seconds on each side  If you need ideas or help with getting more active:  -Silver sneakers https://tools.silversneakers.com  -Walk with a Doc: http://www.duncan-williams.com/  -try to include resistance (weight lifting/strength building) and balance exercises twice per week: or the following link for ideas: http://castillo-powell.com/  BuyDucts.dk  STRESS MANAGEMENT: -can try meditating, or just sitting quietly with deep breathing while intentionally relaxing all parts of your body for 5 minutes daily -if you need further help with stress, anxiety or depression please follow up with your primary doctor or contact the wonderful folks at [facility]: [phone]  SOCIAL CONNECTIONS: -options in [place] if you wish to engage in more social and exercise related activities:  -Silver sneakers https://tools.silversneakers.com  -Walk with a Doc: http://www.duncan-williams.com/  -Check out the [facility] Active Adults 50+ section on the [place] [employer] (hiking clubs, book clubs, cards and games, chess, exercise classes, aquatic classes and much more) - see the website for details: https://www.[place].gov/departments/parks-recreation/active-adults50  -YouTube has lots of exercise videos for different ages and abilities as well  -[facility] (a variety of indoor and outdoor inperson activities for adults). 316-783-3607. [address]  -Virtual Online Classes (a variety of topics): see seniorplanet.org or call  [phone]  -consider volunteering at a school, hospice center, church, senior center or elsewhere

## 2023-08-14 NOTE — Progress Notes (Signed)
PATIENT CHECK-IN and HEALTH RISK ASSESSMENT QUESTIONNAIRE:  -completed by phone/video for upcoming Medicare Preventive Visit  Pre-Visit Check-in: 1)Vitals (height, wt, BP, etc) - record in vitals section for visit on day of visit Request home vitals (wt, BP, etc.) and enter into vitals, THEN update Vital Signs SmartPhrase below at the top of the HPI. See below.  2)Review and Update Medications, Allergies PMH, Surgeries, Social history in Epic 3)Hospitalizations in the last year with date/reason?  No   4)Review and Update Care Team (patient's specialists) in Epic 5) Complete PHQ9 in Epic  6) Complete Fall Screening in Epic 7)Review all Health Maintenance Due and order under PCP if not done.  Medicare Wellness Patient Questionnaire:  Answer theses question about your habits: Do you drink alcohol?  No Have you ever smoked? No   Do you use smokeless tobacco?no  Do you use an illicit drugs? No  Do you exercises? No, but takes care of his mother who is 94 Are you sexually active? No Number of partners? Typical breakfast: sausage and egg burrito  Typical lunch has been skipping lunch  Typical dinner  sandwich  Typical snacks: no   Beverages: water, flavored water or tang   Answer theses question about you: Can you perform most household chores? Yes Do you find it hard to follow a conversation in a noisy room? No  Do you often ask people to speak up or repeat themselves? No  Do you feel that you have a problem with memory? Short  term  Do you balance your checkbook and or bank acounts? Yes  Do you feel safe at home? Yes  Last dentist visit? Within last 2-3 months  Do you need assistance with any of the following: Please note if so  no   Driving?  Feeding yourself?  Getting from bed to chair?  Getting to the toilet?  Bathing or showering?  Dressing yourself?  Managing money?  Climbing a flight of stairs  Preparing meals?    Do you have Advanced Directives in place (Living  Will, Healthcare Power or Attorney)?  Yes    Last eye Exam and location? Last November    Do you currently use prescribed or non-prescribed narcotic or opioid pain medications?  No   Do you have a history or close family history of breast, ovarian, tubal or peritoneal cancer or a family member with BRCA (breast cancer susceptibility 1 and 2) gene mutations? No   Pt not able to check BP. Request home vitals (wt, BP, etc.) and enter into vitals, THEN update Vital Signs SmartPhrase below at the top of the HPI. See below.   Nurse/Assistant Credentials/time stamp: Tora Perches -CMA 4:17 pm   ----------------------------------------------------------------------------------------------------------------------------------------------------------------------------------------------------------------------  Vital Signs: Vital signs are patient reported.   MEDICARE ANNUAL PREVENTIVE CARE VISIT WITH PROVIDER (Welcome to Medicare, initial annual wellness or annual wellness exam)  Virtual Visit via Phone Note  I connected with Javanni Jonette Mate. on 08/14/23  by phone and verified that I am speaking with the correct person using two identifiers.  Location patient: home Location provider:work or home office Persons participating in the virtual visit: patient, provider  Concerns and/or follow up today: reports is stable - but sees specialist for renal disease and is on list for kidney transplant at some point. Not yet on dialysis but preparing for that.    See HM section in Epic for other details of completed HM.    ROS: negative for report of fevers, unintentional weight loss, vision  changes, vision loss, hearing loss or change, chest pain, sob, hemoptysis, melena, hematochezia, hematuria, falls, bleeding or bruising, thoughts of suicide or self harm, memory loss  Patient-completed extensive health risk assessment - reviewed and discussed with the patient: See Health Risk Assessment completed with  patient prior to the visit either above or in recent phone note. This was reviewed in detailed with the patient today and appropriate recommendations, orders and referrals were placed as needed per Summary below and patient instructions.   Review of Medical History: -PMH, PSH, Family History and current specialty and care providers reviewed and updated and listed below   Patient Care Team: Deeann Saint, MD as PCP - General (Family Medicine) Deterding, Fayrene Fearing, MD as Consulting Physician (Nephrology) Jethro Bolus, MD as Consulting Physician (Ophthalmology) Waymon Budge, MD as Consulting Physician (Pulmonary Disease) Lanelle Bal, DO (Internal Medicine)   Past Medical History:  Diagnosis Date   Adenoma 11/03/2009   1.1 cm from TCS   DM (diabetes mellitus) (HCC)    GERD (gastroesophageal reflux disease)    High cholesterol    History of kidney stones    HTN (hypertension)    Kidney failure    stage 4   Kidney stones    Obesity (BMI 30-39.9) DEC 2011 223 LBS   Rectal bleeding 12/04/2009   secondary to hemorrhoids    Past Surgical History:  Procedure Laterality Date   CIRCUMCISION     COLONOSCOPY  Dec 2010 [facility]   MOD IH, SIMPLE ADENOMA 1.1 CM   COLONOSCOPY  11/25/2012   SLF: 1. Sessile polyp measuring 6mm in size was found at the hepatic flesure; polypectomy was performed using snare cautery 2. Moderate diverticulosis was noted in the ascending colon and sigmoid colon. 3. The colon mucosa was otherwise normal 4. Large internal hemorrhoids.    COLONOSCOPY N/A 03/01/2017   Procedure: COLONOSCOPY;  Surgeon: West Bali, MD;  [facility];  Service: Endoscopy;  Laterality: N/A;  10:30 AM   COLONOSCOPY WITH PROPOFOL N/A 04/14/2022   Procedure: COLONOSCOPY WITH PROPOFOL;  Surgeon: Lanelle Bal, DO;  [facility];  Service: Endoscopy;  Laterality: N/A;  11:00am   EYE SURGERY  05/2018   bilateral cataract surgery   HERNIA REPAIR     POLYPECTOMY   04/14/2022   Procedure: POLYPECTOMY;  Surgeon: Lanelle Bal, DO;  [facility];  Service: Endoscopy;;    Social History   Socioeconomic History   Marital status: Married    Spouse name: Not on file   Number of children: 0   Years of education: Not on file   Highest education level: Not on file  Occupational History   Occupation: Surveyor, minerals for [employer]    Comment: retired   Occupation: Surveyor, minerals    Comment: full time from home   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 MARRIED TO 2ND WIFE.      02/06/2019: Lives with mother at this time assisting in her care, while wife lives  nearby, taking care of her own parents.   Works from home full time, but planning on retiring in next month to focus on health/well-being   Used to work out on treadmill, but has not in recent months   Motivated to return to exercise regimine and better diet control   Enjoys watching basketball, football      Social Determinants of Health   Financial Resource Strain: Low Risk  (08/09/2022)   Overall Financial Resource Strain (CARDIA)    Difficulty of Paying Living Expenses: Not hard at all  Food Insecurity: No Food Insecurity (08/09/2022)   Hunger Vital Sign    Worried About Running Out of Food in the Last Year: Never true    Ran Out of Food in the Last Year: Never true  Transportation Needs: No Transportation Needs (08/09/2022)   PRAPARE - Administrator, Civil Service (Medical): No    Lack of Transportation (Non-Medical): No  Physical Activity: Inactive (08/09/2022)   Exercise Vital Sign    Days of  Exercise per Week: 0 days    Minutes of Exercise per Session: 0 min  Stress: No Stress Concern Present (08/09/2022)   [employer] of Occupational Health - Occupational Stress Questionnaire    Feeling of Stress : Not at all  Social Connections: Socially Integrated (08/09/2022)   Social Connection and Isolation Panel [NHANES]    Frequency of Communication with Friends and Family: More than three times a week    Frequency of Social Gatherings with Friends and Family: More than three times a week    Attends Religious Services: More than 4 times per year    Active Member of [employer] or Organizations: Yes    Attends Engineer, structural: More than 4 times per year    Marital Status: Married  Catering manager Violence: Not At Risk (08/09/2022)   Humiliation, Afraid, Rape, and Kick questionnaire    Fear of Current or Ex-Partner: No    Emotionally Abused: No    Physically Abused: No    Sexually Abused: No    Family History  Problem Relation Age of Onset   Lung cancer Father    High blood pressure Father    Colon cancer Neg Hx    Colon polyps Neg Hx     Current Outpatient Medications on File Prior to Visit  Medication Sig Dispense Refill   acetaminophen (TYLENOL) 325 MG tablet Take 2 tablets (650 mg total) by mouth every 6 (six) hours as needed for moderate pain. 30 tablet 0   allopurinol (ZYLOPRIM) 100 MG tablet Take 300 mg by mouth daily.      amLODipine (NORVASC) 10 MG tablet TAKE 1 TABLET BY MOUTH EVERYDAY AT BEDTIME 90 tablet 0   atorvastatin (LIPITOR) 10 MG tablet TAKE 1 TABLET BY MOUTH EVERY DAY (NEED PHYSICAL) 90 tablet 1   calcitRIOL (ROCALTROL) 0.25 MCG capsule Take 0.25 mcg by mouth at bedtime.      famotidine (PEPCID) 40 MG tablet TAKE 1 TABLET (40 MG TOTAL) BY MOUTH DAILY. WITH BREAKFAST 90 tablet 3   furosemide (LASIX) 40 MG tablet Take 40 mg by mouth daily.     gabapentin (NEURONTIN) 100 MG capsule TAKE 1 CAPSULE BY MOUTH THREE TIMES A DAY 90 capsule 2   hydrALAZINE  (APRESOLINE) 25 MG tablet TAKE 1 (ONE) TABLET THREE TIMES DAILY 270 tablet 3   promethazine-dextromethorphan (PROMETHAZINE-DM) 6.25-15 MG/5ML syrup Take 5 mLs by mouth 4 (four) times daily as needed. 100 mL 0  propranolol (INDERAL) 80 MG tablet TAKE 1 TABLET BY MOUTH TWICE A DAY 180 tablet 0   rosuvastatin (CRESTOR) 5 MG tablet Take 5 mg by mouth daily.     tamsulosin (FLOMAX) 0.4 MG CAPS capsule Take 0.4 mg by mouth daily.     traZODone (DESYREL) 50 MG tablet TAKE 1 TABLET BY MOUTH EVERYDAY AT BEDTIME (Patient taking differently: 50 mg.) 90 tablet 2   fluticasone (FLONASE) 50 MCG/ACT nasal spray Place 1 spray into both nostrils daily for 14 days. (Patient taking differently: Place 1 spray into both nostrils at bedtime as needed for allergies.) 16 g 0   No current facility-administered medications on file prior to visit.    Allergies  Allergen Reactions   Dye Fdc Blue [Brilliant Blue Fcf (Fd&C Blue #1)]     PT UNSURE WHICH DYE   Iodinated Contrast Media Hives    30 YEARS AGO, recent 2021 epidural with contrast ok       Physical Exam Vitals requested from patient and listed below if patient had equipment and was able to obtain at home for this virtual visit: There were no vitals filed for this visit. Estimated body mass index is 28.12 kg/m as calculated from the following:   Height as of 08/08/23: 5\' 10"  (1.778 m).   Weight as of this encounter: 196 lb (88.9 kg).  EKG (optional): deferred due to virtual visit  GENERAL: alert, oriented, no acute distress detected; full vision exam deferred due to pandemic and/or virtual encounter   PSYCH/NEURO: pleasant and cooperative, no obvious depression or anxiety, speech and thought processing grossly intact, Cognitive function grossly intact  Flowsheet Row Office Visit from 08/09/2022 in [facility]  PHQ-9 Total Score 0           08/14/2023    4:05 PM 08/09/2022    2:40 PM 07/27/2022    9:34 AM 12/26/2021    11:14 AM 07/29/2021    3:06 PM  Depression screen PHQ 2/9  Decreased Interest 0 0 0 0 0  Down, Depressed, Hopeless 0 0 0 1 0  PHQ - 2 Score 0 0 0 1 0  Altered sleeping  0 1 1   Tired, decreased energy  0 1 1   Change in appetite  0 1 0   Feeling bad or failure about yourself   0 1 1   Trouble concentrating  0 1 0   Moving slowly or fidgety/restless  0 0 0   Suicidal thoughts  0 0 0   PHQ-9 Score  0 5 4        07/27/2022    9:34 AM 08/09/2022    2:45 PM 10/20/2022    4:40 PM 12/11/2022    4:20 PM 08/14/2023    4:05 PM  Fall Risk  Falls in the past year? 1 0   0  Was there an injury with Fall? 0 0   0  Fall Risk Category Calculator 2 0   0  Fall Risk Category (Retired) Moderate Low     (RETIRED) Patient Fall Risk Level Moderate fall risk Low fall risk Low fall risk Low fall risk   Patient at Risk for Falls Due to Other (Comment) No Fall Risks   No Fall Risks;Other (Comment)  Fall risk Follow up Falls evaluation completed Falls prevention discussed   Falls evaluation completed     SUMMARY AND PLAN:  Encounter for Medicare annual wellness exam   Discussed applicable health maintenance/preventive health  measures and advised and referred or ordered per patient preferences: -he agrees to set up eye exam -reports he had hgba1c in the last week and was 5.7 -he reports he thinks he had the tetanus and the shingles shots and he will check with his pharmacy and let us know -he plans to get the other vaccines at the pharmacy  Health Maintenance  Topic Date Due   Diabetic kidney evaluation - Urine ACR  Never done   Hepatitis C Screening  Never done   DTaP/Tdap/Td (1 - Tdap) Never done   Zoster Vaccines- Shingrix (1 of 2) 07/03/1999   OPHTHALMOLOGY EXAM  10/04/2022   FOOT EXAM  11/24/2022   HEMOGLOBIN A1C  05/25/2023   INFLUENZA VACCINE  07/05/2023   COVID-19 Vaccine (6 - 2023-24 season) 08/05/2023   Diabetic kidney evaluation - eGFR measurement  06/19/2024   Medicare Annual  Wellness (AWV)  08/13/2024   Colonoscopy  04/14/2032   Pneumonia Vaccine 86+ Years old  Completed   HPV VACCINES  Aged [employer] and counseling on the following was provided based on the above review of health and a plan/checklist for the patient, along with additional information discussed, was provided for the patient in the patient instructions :  -Provided counseling and plan for increased risk of falling and provided safe balance exercises that can be done at home to improve balance and discussed exercise guidelines for adults with include balance exercises at least 3 days per week.  -Advised and counseled on a healthy lifestyle - including the importance of a healthy diet, regular physical activity, social connections and stress management. -Reviewed patient's current diet. Advised and counseled on a whole foods based healthy diet. A summary of a healthy diet was provided in the Patient Instructions.  -reviewed patient's current physical activity level and discussed exercise guidelines for adults. Discussed community resources and ideas for safe exercise at home to assist in meeting exercise guideline recommendations in a safe and healthy way.  -Advise yearly dental visits at minimum and regular eye exams   Follow up: see patient instructions   Patient Instructions  I really enjoyed getting to talk with you today! I am available on Tuesdays and Thursdays for virtual visits if you have any questions or concerns, or if I can be of any further assistance.   CHECKLIST FROM ANNUAL WELLNESS VISIT:  -Follow up (please call to schedule if not scheduled after visit):   -yearly for annual wellness visit with primary care office  Here is a list of your preventive care/health maintenance measures and the plan for each if any are due:  PLAN For any measures below that may be due:  -please see what vaccines you had at the pharmacy and let us know -get the updated covid and flu  shots -schedule your diabetic eye exam  Health Maintenance  Topic Date Due   Diabetic kidney evaluation - Urine ACR  Never done   Hepatitis C Screening  Never done   DTaP/Tdap/Td (1 - Tdap) Never done   Zoster Vaccines- Shingrix (1 of 2) 07/03/1999   OPHTHALMOLOGY EXAM  10/04/2022   FOOT EXAM  11/24/2022   HEMOGLOBIN A1C  05/25/2023   INFLUENZA VACCINE  07/05/2023   COVID-19 Vaccine (6 - 2023-24 season) 08/05/2023   Diabetic kidney evaluation - eGFR measurement  06/19/2024   Medicare Annual Wellness (AWV)  08/13/2024   Colonoscopy  04/14/2032   Pneumonia Vaccine 19+ Years old  Completed   HPV VACCINES  Aged  Out    -See a dentist at least yearly  -Get your eyes checked and then per your eye specialist's recommendations  -Other issues addressed today:   -I have included below further information regarding a healthy whole foods based diet, physical activity guidelines for adults, stress management and opportunities for social connections. I hope you find this information useful.   -----------------------------------------------------------------------------------------------------------------------------------------------------------------------------------------------------------------------------------------------------------  NUTRITION: -eat real food: lots of colorful vegetables (half the plate) and fruits -5-7 servings of vegetables and fruits per day (fresh or steamed is best), exp. 2 servings of vegetables with lunch and dinner and 2 servings of fruit per day. Berries and greens such as kale and collards are great choices.  -consume on a regular basis: whole grains (make sure first ingredient on label contains the word "whole"), fresh fruits, fish, nuts, seeds, healthy oils (such as olive oil, avocado oil, grape seed oil) -may eat small amounts of dairy and lean meat on occasion, but avoid processed meats such as ham, bacon, lunch meat, etc. -drink water -try to avoid fast  food and pre-packaged foods, processed meat -most experts advise limiting sodium to < 2300mg  per day, should limit further is any chronic conditions such as high blood pressure, heart disease, diabetes, etc. The American Heart Association advised that < 1500mg  is is ideal -try to avoid foods that contain any ingredients with names you do not recognize  -try to avoid sugar/sweets (except for the natural sugar that occurs in fresh fruit) -try to avoid sweet drinks -try to avoid white rice, white bread, pasta (unless whole grain), white or yellow potatoes  EXERCISE GUIDELINES FOR ADULTS: -if you wish to increase your physical activity, do so gradually and with the approval of your doctor -STOP and seek medical care immediately if you have any chest pain, chest discomfort or trouble breathing when starting or increasing exercise  -move and stretch your body, legs, feet and arms when sitting for long periods -Physical activity guidelines for optimal health in adults: -least 150 minutes per week of aerobic exercise (can talk, but not sing) once approved by your doctor, 20-30 minutes of sustained activity or two 10 minute episodes of sustained activity every day.  -resistance training at least 2 days per week if approved by your doctor -balance exercises 3+ days per week:   Stand somewhere where you have something sturdy to hold onto if you lose balance.    1) lift up on toes, start with 5x per day and work up to 20x   2) stand and lift on leg straight out to the side so that foot is a few inches of the floor, start with 5x each side and work up to 20x each side   3) stand on one foot, start with 5 seconds each side and work up to 20 seconds on each side  If you need ideas or help with getting more active:  -Silver sneakers https://tools.silversneakers.com  -Walk with a Doc: http://www.duncan-williams.com/  -try to include resistance (weight lifting/strength building) and balance exercises twice per  week: or the following link for ideas: http://castillo-powell.com/  BuyDucts.dk  STRESS MANAGEMENT: -can try meditating, or just sitting quietly with deep breathing while intentionally relaxing all parts of your body for 5 minutes daily -if you need further help with stress, anxiety or depression please follow up with your primary doctor or contact the wonderful folks at [facility]: [phone]  SOCIAL CONNECTIONS: -options in [place] if you wish to engage in more social and exercise related activities:  -  Silver sneakers https://tools.silversneakers.com  -Walk with a Doc: http://www.duncan-williams.com/  -Check out the [facility] Active Adults 50+ section on the [place] [employer] (hiking clubs, book clubs, cards and games, chess, exercise classes, aquatic classes and much more) - see the website for details: https://www.[place].gov/departments/parks-recreation/active-adults50  -YouTube has lots of exercise videos for different ages and abilities as well  -[facility] (a variety of indoor and outdoor inperson activities for adults). [phone]. [address]  -Virtual Online Classes (a variety of topics): see seniorplanet.org or call (870)291-5539  -consider volunteering at a school, hospice center, church, senior center or elsewhere           Terressa Koyanagi, DO

## 2023-08-18 ENCOUNTER — Other Ambulatory Visit: Payer: Self-pay | Admitting: Family Medicine

## 2023-08-18 DIAGNOSIS — I10 Essential (primary) hypertension: Secondary | ICD-10-CM

## 2023-09-13 ENCOUNTER — Encounter: Payer: Medicare Other | Admitting: Gastroenterology

## 2023-10-02 ENCOUNTER — Telehealth: Payer: Self-pay

## 2023-10-02 NOTE — Telephone Encounter (Signed)
Cpap physician order from [facility] has been sent to [facility] with [facility]

## 2023-10-03 ENCOUNTER — Ambulatory Visit (INDEPENDENT_AMBULATORY_CARE_PROVIDER_SITE_OTHER): Payer: Medicare Other

## 2023-10-03 ENCOUNTER — Ambulatory Visit: Payer: Medicare Other | Admitting: Family Medicine

## 2023-10-03 VITALS — BP 122/70 | HR 51 | Temp 98.7°F | Ht 70.0 in | Wt 202.2 lb

## 2023-10-03 DIAGNOSIS — G629 Polyneuropathy, unspecified: Secondary | ICD-10-CM

## 2023-10-03 DIAGNOSIS — R051 Acute cough: Secondary | ICD-10-CM

## 2023-10-03 DIAGNOSIS — N185 Chronic kidney disease, stage 5: Secondary | ICD-10-CM | POA: Diagnosis not present

## 2023-10-03 DIAGNOSIS — E1122 Type 2 diabetes mellitus with diabetic chronic kidney disease: Secondary | ICD-10-CM

## 2023-10-03 MED ORDER — BENZONATATE 100 MG PO CAPS: 100.0000 mg | ORAL_CAPSULE | Freq: Two times a day (BID) | ORAL | 0 refills | Status: DC | PRN

## 2023-10-03 NOTE — Patient Instructions (Addendum)
Due to your kidney function the maximum amount of allopurinol you should take is 50 mg 2 times per week.  A chest x-Arsenio was ordered to evaluate your cough.  If medication is needed based on imaging results we will send it to your local pharmacy.  We will try to obtain the records from your recent labs from your nephrologist.

## 2023-10-03 NOTE — Progress Notes (Unsigned)
Established Patient Office Visit   Subjective  Patient ID: Bruce Reine., male    DOB: 18-Aug-1951  Age: 72 y.o. MRN: [id number]  Chief Complaint  Patient presents with   Medical Management of Chronic Issues    BG- 113 this morning,     Patient is a 72 year old male seen for follow-up on chronic concerns.  Patient states has been doing well overall.  Patient's mother died recently.  She was 26 years old.  Has noticed increased fatigue.  Does not prevent him from doing activities.  Also notes feeling cold more frequently.  Has not been outside high school football games due to the symptoms.  Patient remains on kidney transplant list.  States GFR decreased further at last visit with nephrology.  Has an appointment at the end of next month.  Preparing to start PD.  Patient endorses tingling and tingling in bilateral feet causing pain.  Taking allopurinol 300 mg daily.  Endorses having labs last week with [facility].  Patient with cough and congestion times a few weeks.    Patient Active Problem List   Diagnosis Date Noted   Internal hemorrhoids 07/12/2023   Benign prostatic hyperplasia with urinary hesitancy 01/11/2022   History of hernia repair 01/11/2022   Chronic rhinitis 07/27/2021   History of kidney stones 01/26/2021   Pre-diabetes 01/26/2021   Pre-transplant evaluation for kidney transplant 01/26/2021   Essential hypertension 09/07/2020   Diet-controlled diabetes mellitus (HCC) 09/07/2020   CKD (chronic kidney disease) stage 4, GFR 15-29 ml/min (HCC) 09/07/2020   SOB (shortness of breath) on exertion 09/07/2020   Foraminal stenosis of lumbar region 05/05/2020   Colon adenomas    Insomnia 01/11/2016   Internal hemorrhoids with complication 12/18/2013   Obstructive sleep apnea 07/27/2013   Seasonal and perennial allergic rhinitis 07/27/2013   Hx of colonic polyps 11/15/2012   UNSPECIFIED DISEASE OF THE JAWS 03/11/2010   COLONIC POLYPS, ADENOMATOUS 06/30/2009   HYPERLIPIDEMIA  10/13/2008   GLOBUS HYSTERICUS 10/13/2008   GASTROESOPHAGEAL REFLUX DISEASE 10/13/2008   RENAL CALCULUS 10/13/2008   Dysphagia, idiopathic 10/13/2008   ABDOMINAL PAIN 10/13/2008   H N P-LUMBAR 06/23/2008   SPINAL STENOSIS 06/23/2008   Low back pain 06/23/2008   Past Medical History:  Diagnosis Date   Adenoma 11/03/2009   1.1 cm from TCS   DM (diabetes mellitus) (HCC)    GERD (gastroesophageal reflux disease)    High cholesterol    History of kidney stones    HTN (hypertension)    Kidney failure    stage 4   Kidney stones    Obesity (BMI 30-39.9) DEC 2011 223 LBS   Rectal bleeding 12/04/2009   secondary to hemorrhoids   Past Surgical History:  Procedure Laterality Date   CIRCUMCISION     COLONOSCOPY  Dec 2010 [facility]   MOD IH, SIMPLE ADENOMA 1.1 CM   COLONOSCOPY  11/25/2012   SLF: 1. Sessile polyp measuring 6mm in size was found at the hepatic flesure; polypectomy was performed using snare cautery 2. Moderate diverticulosis was noted in the ascending colon and sigmoid colon. 3. The colon mucosa was otherwise normal 4. Large internal hemorrhoids.    COLONOSCOPY N/A 03/01/2017   Procedure: COLONOSCOPY;  Surgeon: West Bali, MD;  [facility];  Service: Endoscopy;  Laterality: N/A;  10:30 AM   COLONOSCOPY WITH PROPOFOL N/A 04/14/2022   Procedure: COLONOSCOPY WITH PROPOFOL;  Surgeon: Lanelle Bal, DO;  [facility]: [facility];  Service: Endoscopy;  Laterality:  N/A;  11:00am   EYE SURGERY  05/2018   bilateral cataract surgery   HERNIA REPAIR     POLYPECTOMY  04/14/2022   Procedure: POLYPECTOMY;  Surgeon: Lanelle Bal, DO;  [facility]: [facility];  Service: Endoscopy;;   Social History   Tobacco Use   Smoking status: Never    Passive exposure: Never   Smokeless tobacco: Never   Tobacco comments:    Never smoked  Vaping Use   Vaping status: Never Used  Substance Use Topics   Alcohol use: No   Drug use: No   Family History  Problem Relation Age of  Onset   Lung cancer Father    High blood pressure Father    Colon cancer Neg Hx    Colon polyps Neg Hx    Allergies  Allergen Reactions   Dye Fdc Blue [Brilliant Blue Fcf (Fd&C Blue #1)]     PT UNSURE WHICH DYE   Iodinated Contrast Media Hives    30 YEARS AGO, recent 2021 epidural with contrast ok      ROS Negative unless stated above    Objective:     BP 122/70 (BP Location: Left Arm, Patient Position: Sitting, Cuff Size: Normal)   Pulse (!) 51   Temp 98.7 F (37.1 C) (Oral)   Ht 5\' 10"  (1.778 m)   Wt 202 lb 3.2 oz (91.7 kg)   SpO2 99%   BMI 29.01 kg/m  BP Readings from Last 3 Encounters:  10/03/23 122/70  08/08/23 (!) 145/77  07/26/23 (!) 143/75   Wt Readings from Last 3 Encounters:  10/03/23 202 lb 3.2 oz (91.7 kg)  08/14/23 196 lb (88.9 kg)  08/08/23 201 lb 8 oz (91.4 kg)      Physical Exam Constitutional:      General: He is not in acute distress.    Appearance: Normal appearance.  HENT:     Head: Normocephalic and atraumatic.     Nose: Nose normal.     Mouth/Throat:     Mouth: Mucous membranes are moist.  Eyes:     Extraocular Movements: Extraocular movements intact.     Conjunctiva/sclera: Conjunctivae normal.     Pupils: Pupils are equal, round, and reactive to light.  Cardiovascular:     Rate and Rhythm: Normal rate and regular rhythm.     Heart sounds: Normal heart sounds. No murmur heard.    No gallop.  Pulmonary:     Effort: Pulmonary effort is normal. No respiratory distress.     Breath sounds: Normal breath sounds. No wheezing, rhonchi or rales.  Skin:    General: Skin is warm and dry.  Neurological:     Mental Status: He is alert and oriented to person, place, and time.     No results found for any visits on 10/03/23.    Assessment & Plan:  Type 2 diabetes mellitus with stage 5 chronic kidney disease not on chronic dialysis, without long-term current use of insulin (HCC) -Stable -Will obtain recent labs to see if hemoglobin  A1c checked.   -Continue lifestyle modifications  Acute cough -     DG Chest 2 View -     Benzonatate; Take 1 capsule (100 mg total) by mouth 2 (two) times daily as needed for cough.  Dispense: 20 capsule; Refill: 0  Neuropathy -Continue gabapentin 100 mg daily as needed -Recheck A1c  Chronic kidney disease, stage V (HCC) -Patient advised to limit use of certain medications as they should be renally  dosed. -Advised maximum amount of allopurinol 50 mg 2 times per week. -On transplant list. -Will likely be starting PD for declining renal function. -Continue follow-up with [facility]  Return in about 2 weeks (around 10/17/2023), or if symptoms worsen or fail to improve.   Deeann Saint, MD

## 2023-10-06 ENCOUNTER — Other Ambulatory Visit: Payer: Self-pay | Admitting: Family Medicine

## 2023-10-07 ENCOUNTER — Other Ambulatory Visit: Payer: Self-pay | Admitting: Family Medicine

## 2023-10-07 IMAGING — DX DG FOOT COMPLETE 3+V*R*
3 series · 3 of 3 positions shown · non-contrast
Comparison: None

CLINICAL DATA: RIGHT foot pain for 2 days post fall

EXAM:
RIGHT FOOT COMPLETE - 3+ VIEW

[foot ap]
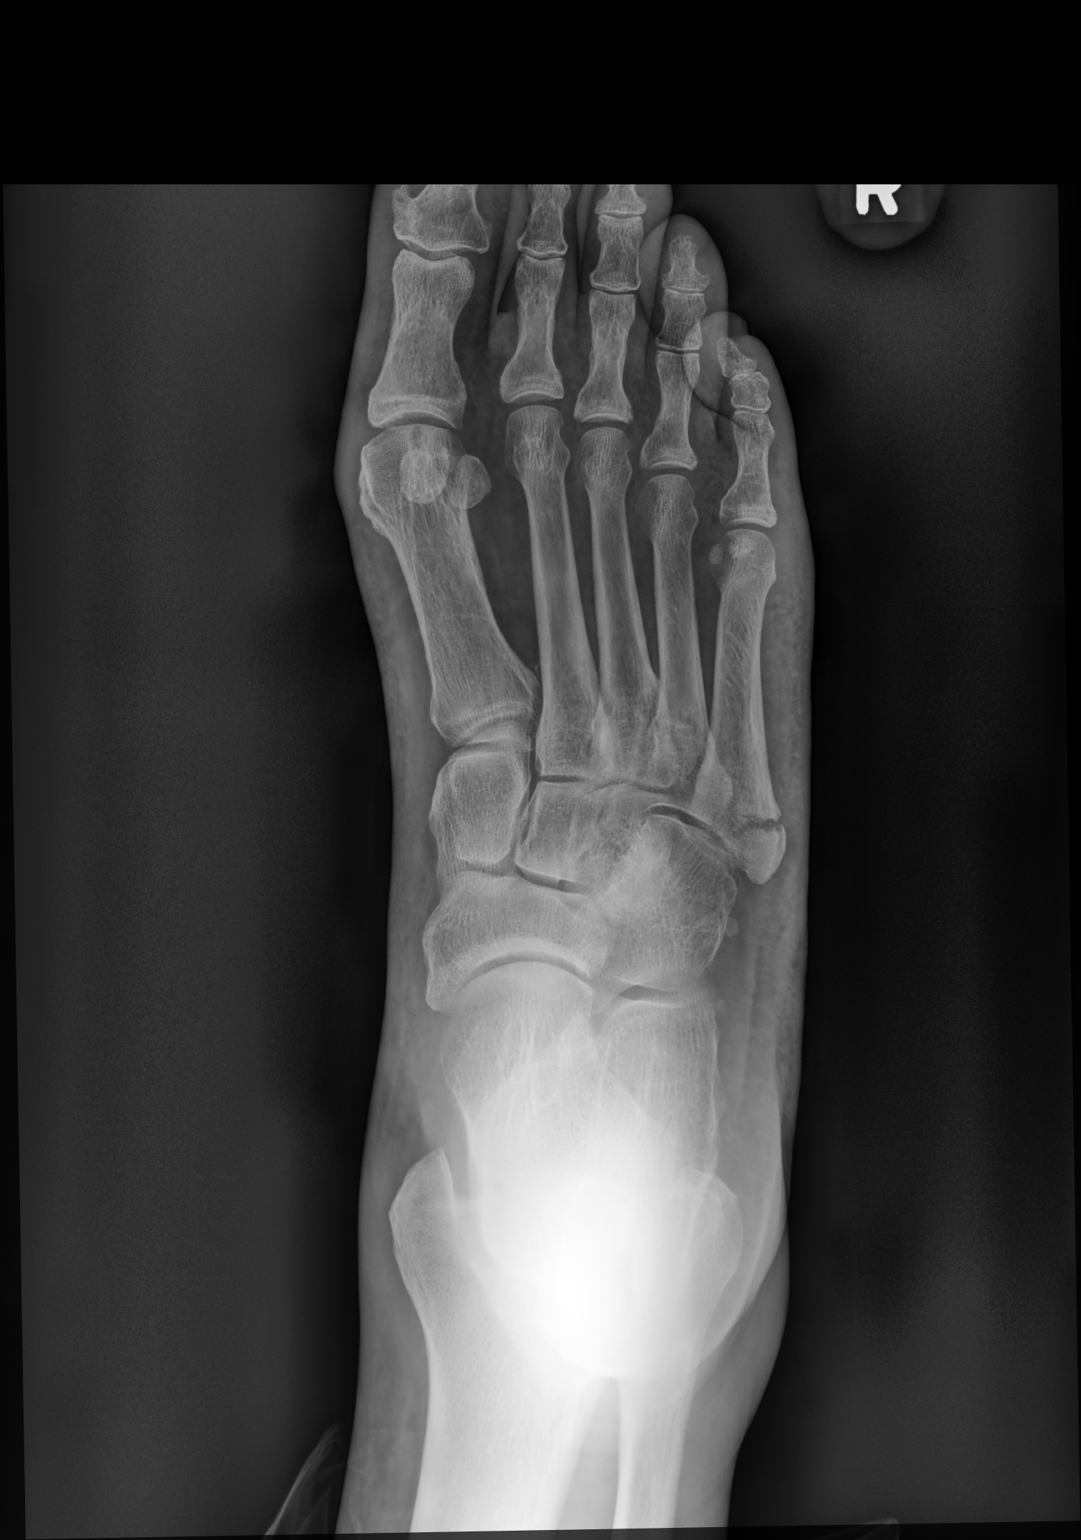

[foot mlo]
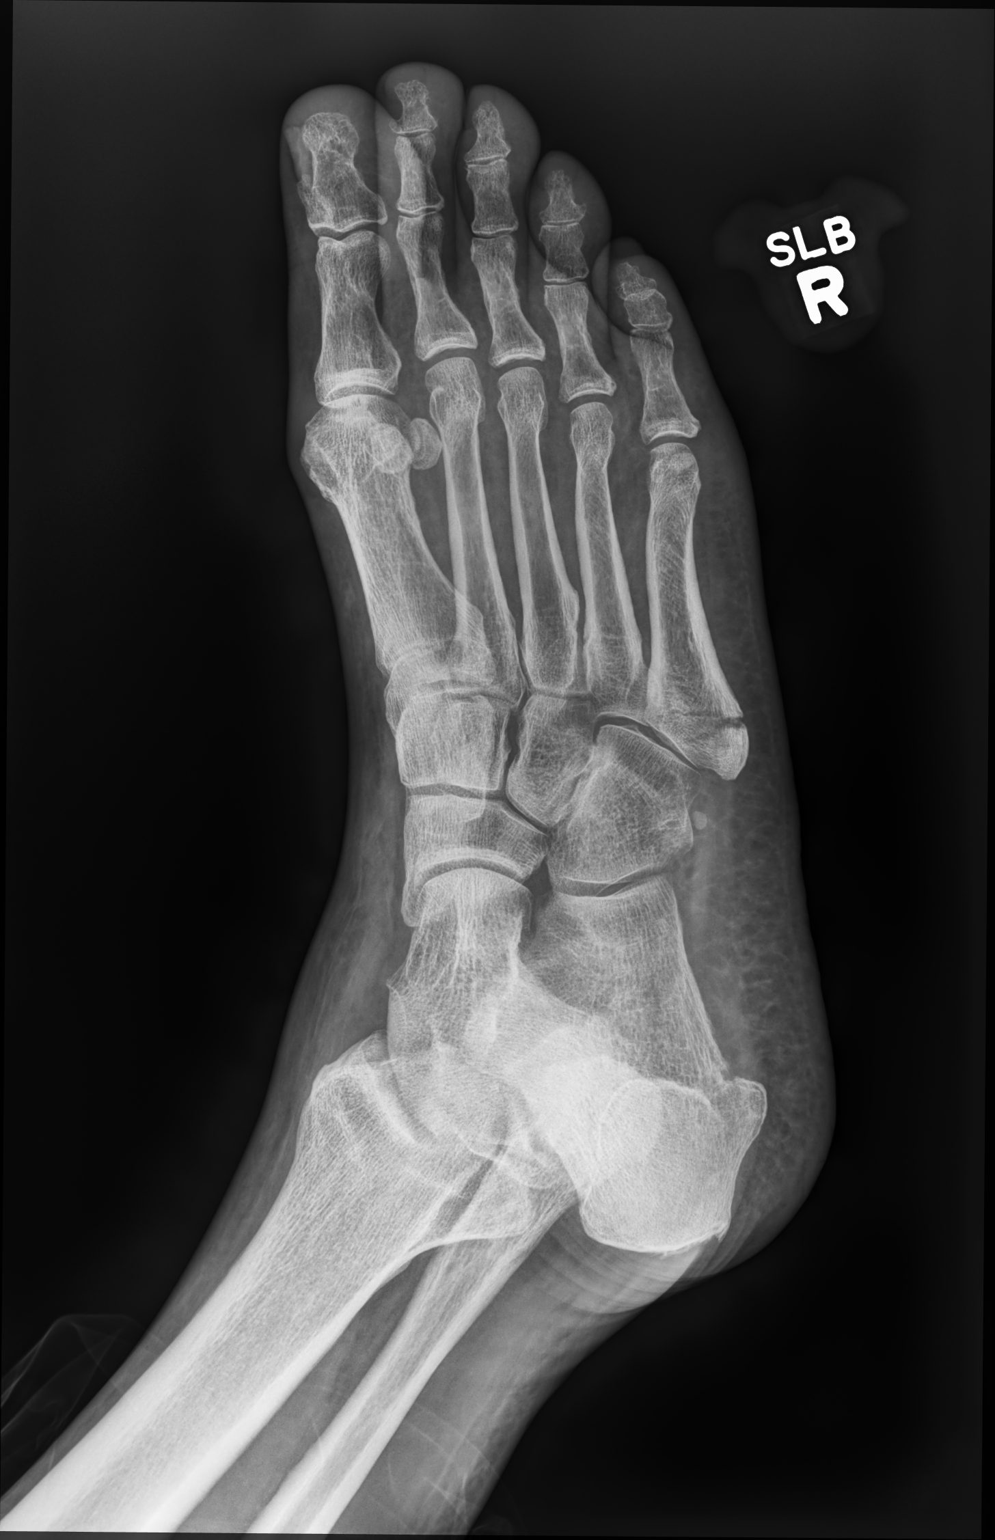

[foot lat]
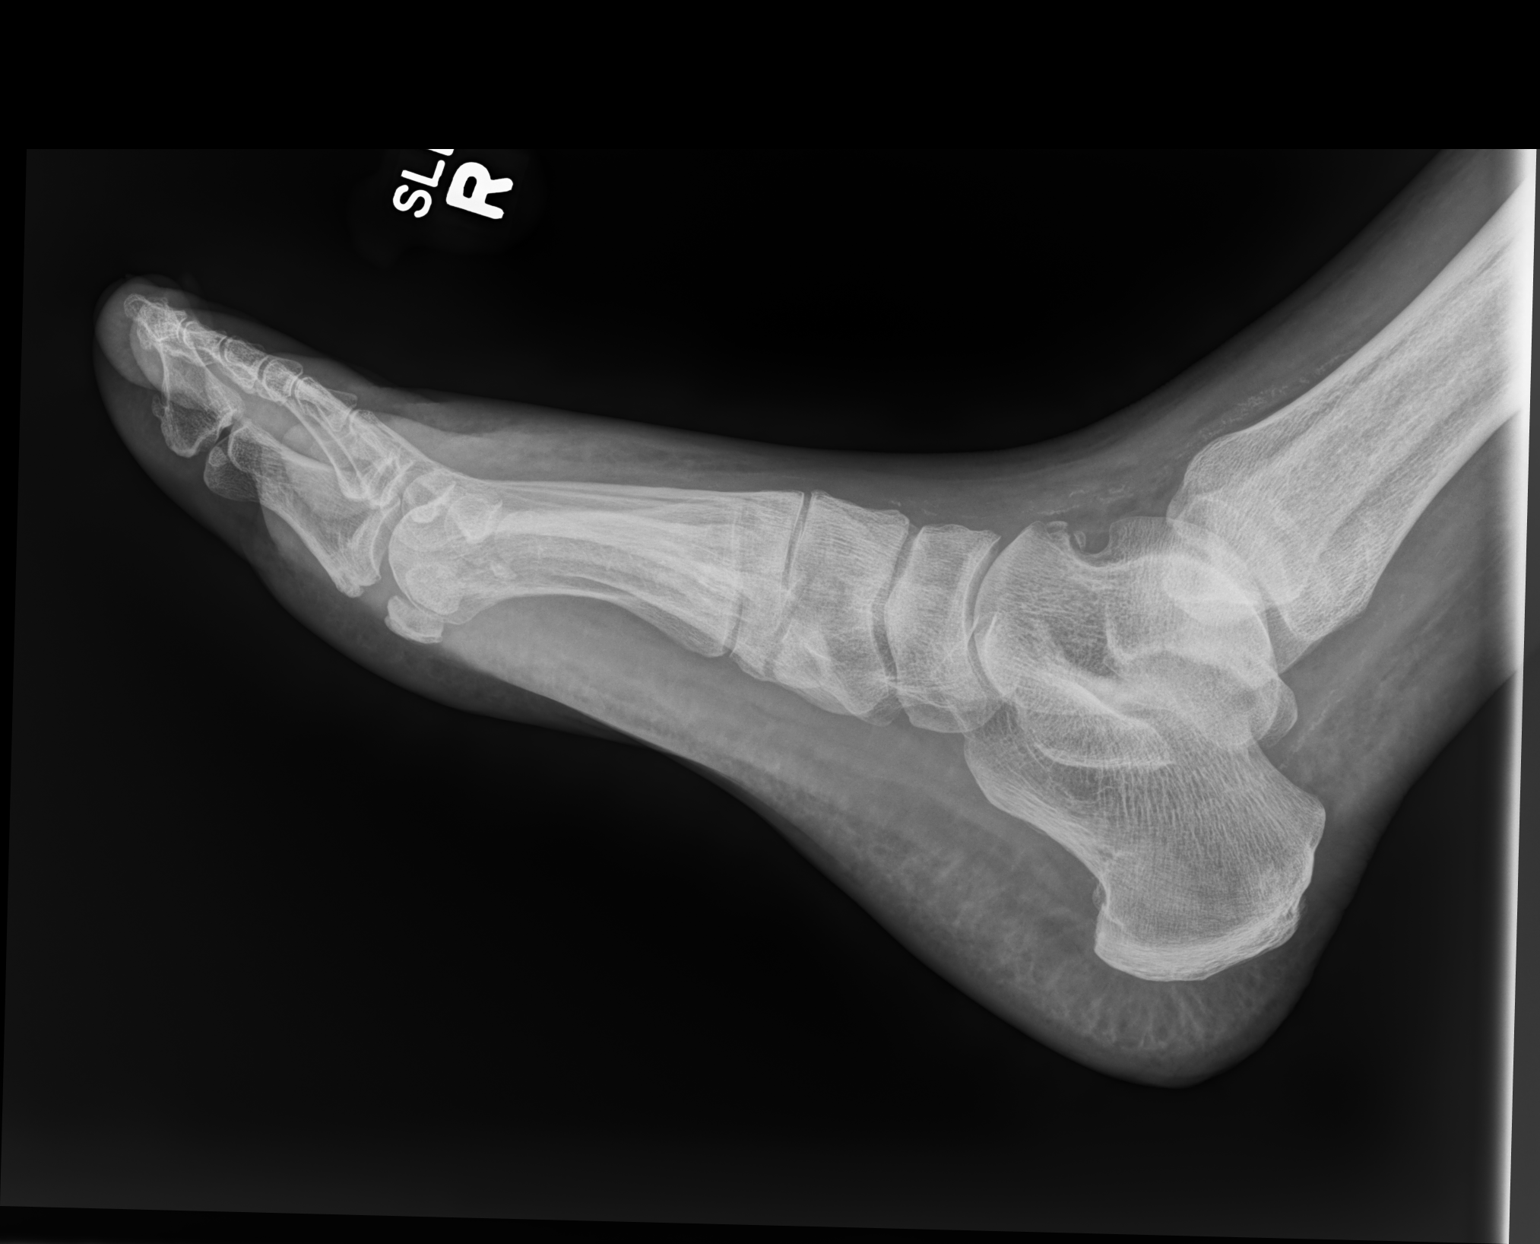

[3 of 3 positions shown; findings below may reference images not displayed]

FINDINGS: Osseous demineralization.

Joint spaces preserved.

Transverse nondisplaced fracture at base of fifth metatarsal.

No additional fracture, dislocation, or bone destruction.

Scattered small vessel vascular calcification at ankle.
IMPRESSION: Transverse nondisplaced fracture at base of RIGHT fifth metatarsal.

## 2023-10-09 ENCOUNTER — Telehealth: Payer: Self-pay | Admitting: Internal Medicine

## 2023-10-09 NOTE — Telephone Encounter (Signed)
Dr. Salomon Fick got CPAP order w/[facility]. They will fax to [place] since they do not handle his CPAP.

## 2023-10-17 ENCOUNTER — Ambulatory Visit (INDEPENDENT_AMBULATORY_CARE_PROVIDER_SITE_OTHER): Payer: Medicare Other | Admitting: Family Medicine

## 2023-10-17 ENCOUNTER — Other Ambulatory Visit: Payer: Self-pay | Admitting: Family Medicine

## 2023-10-17 ENCOUNTER — Ambulatory Visit (HOSPITAL_COMMUNITY)
Admission: RE | Admit: 2023-10-17 | Discharge: 2023-10-17 | Disposition: A | Discharge status: Home / Self Care | Payer: Medicare Other | Source: Ambulatory Visit | Attending: Family Medicine | Admitting: Family Medicine

## 2023-10-17 ENCOUNTER — Encounter: Payer: Self-pay | Admitting: Family Medicine

## 2023-10-17 ENCOUNTER — Telehealth: Payer: Self-pay | Admitting: Family Medicine

## 2023-10-17 VITALS — BP 130/72 | HR 55 | Temp 98.6°F | Ht 70.0 in | Wt 201.4 lb

## 2023-10-17 DIAGNOSIS — E1122 Type 2 diabetes mellitus with diabetic chronic kidney disease: Secondary | ICD-10-CM | POA: Diagnosis not present

## 2023-10-17 DIAGNOSIS — N184 Chronic kidney disease, stage 4 (severe): Secondary | ICD-10-CM

## 2023-10-17 DIAGNOSIS — R1031 Right lower quadrant pain: Secondary | ICD-10-CM

## 2023-10-17 DIAGNOSIS — J302 Other seasonal allergic rhinitis: Secondary | ICD-10-CM

## 2023-10-17 DIAGNOSIS — N185 Chronic kidney disease, stage 5: Secondary | ICD-10-CM

## 2023-10-17 DIAGNOSIS — J3089 Other allergic rhinitis: Secondary | ICD-10-CM

## 2023-10-17 LAB — POCT GLYCOSYLATED HEMOGLOBIN (HGB A1C): Hemoglobin A1C: 5.1 % (ref 4.0–5.6)

## 2023-10-17 MED ORDER — FEXOFENADINE HCL 60 MG PO TABS: 60.0000 mg | ORAL_TABLET | Freq: Every day | ORAL | 0 refills | Status: DC

## 2023-10-17 NOTE — Patient Instructions (Addendum)
A referral was placed for you to have an ultrasound done at [facility].  They will contact you about setting up this appointment.  You can take Allegra 60 mg daily as needed for allergies.  Local honey can also be helpful for allergies, but of course it may elevate your blood sugar.  You can also use saline nasal rinse which is sterile salt water to help clean out your nose.  Flonase can also be helpful.

## 2023-10-17 NOTE — Progress Notes (Signed)
Established Patient Office Visit   Subjective  Patient ID: Bruce Weber., male    DOB: [birth year]  Age: 72 y.o. MRN: [id number]  Chief Complaint  Patient presents with   Medical Management of Chronic Issues    Patient is a 72 year old male seen for follow-up.  Patient endorses Brueggeman cough since last OFV.  Taking OTC Allegra 180 mg daily but inquires if it is okay due to his renal status.  Patient has upcoming appointment with nephrology regarding recent labs with worsening kidney function.  Patient remains on renal transplant list.  Patient endorses occasional brief right groin pain x 3 days.  Noticed with going down step.  Patient notes a prior history of hernia repair in 1991.  Does not recall which side.  Has been doing lifting, pushing/pulling.   Patient Active Problem List   Diagnosis Date Noted   Internal hemorrhoids 07/12/2023   Benign prostatic hyperplasia with urinary hesitancy 01/11/2022   History of hernia repair 01/11/2022   Chronic rhinitis 07/27/2021   History of kidney stones 01/26/2021   Pre-diabetes 01/26/2021   Pre-transplant evaluation for kidney transplant 01/26/2021   Essential hypertension 09/07/2020   Diet-controlled diabetes mellitus (HCC) 09/07/2020   CKD (chronic kidney disease) stage 4, GFR 15-29 ml/min (HCC) 09/07/2020   SOB (shortness of breath) on exertion 09/07/2020   Foraminal stenosis of lumbar region 05/05/2020   Colon adenomas    Insomnia 01/11/2016   Internal hemorrhoids with complication 12/18/2013   Obstructive sleep apnea 07/27/2013   Seasonal and perennial allergic rhinitis 07/27/2013   Hx of colonic polyps 11/15/2012   UNSPECIFIED DISEASE OF THE JAWS 03/11/2010   COLONIC POLYPS, ADENOMATOUS 06/30/2009   HYPERLIPIDEMIA 10/13/2008   GLOBUS HYSTERICUS 10/13/2008   GASTROESOPHAGEAL REFLUX DISEASE 10/13/2008   RENAL CALCULUS 10/13/2008   Dysphagia, idiopathic 10/13/2008   ABDOMINAL PAIN 10/13/2008   H N P-LUMBAR 06/23/2008    SPINAL STENOSIS 06/23/2008   Low back pain 06/23/2008   Past Medical History:  Diagnosis Date   Adenoma 11/03/2009   1.1 cm from TCS   DM (diabetes mellitus) (HCC)    GERD (gastroesophageal reflux disease)    High cholesterol    History of kidney stones    HTN (hypertension)    Kidney failure    stage 4   Kidney stones    Obesity (BMI 30-39.9) DEC 2011 223 LBS   Rectal bleeding 12/04/2009   secondary to hemorrhoids   Past Surgical History:  Procedure Laterality Date   CIRCUMCISION     COLONOSCOPY  Dec 2010 [facility]   MOD IH, SIMPLE ADENOMA 1.1 CM   COLONOSCOPY  11/25/2012   SLF: 1. Sessile polyp measuring 6mm in size was found at the hepatic flesure; polypectomy was performed using snare cautery 2. Moderate diverticulosis was noted in the ascending colon and sigmoid colon. 3. The colon mucosa was otherwise normal 4. Large internal hemorrhoids.    COLONOSCOPY N/A 03/01/2017   Procedure: COLONOSCOPY;  Surgeon: West Bali, MD;  [facility];  Service: Endoscopy;  Laterality: N/A;  10:30 AM   COLONOSCOPY WITH PROPOFOL N/A 04/14/2022   Procedure: COLONOSCOPY WITH PROPOFOL;  Surgeon: Lanelle Bal, DO;  [facility];  Service: Endoscopy;  Laterality: N/A;  11:00am   EYE SURGERY  05/2018   bilateral cataract surgery   HERNIA REPAIR     POLYPECTOMY  04/14/2022   Procedure: POLYPECTOMY;  Surgeon: Lanelle Bal, DO;  [facility];  Service: Endoscopy;;  Social History   Tobacco Use   Smoking status: Never    Passive exposure: Never   Smokeless tobacco: Never   Tobacco comments:    Never smoked  Vaping Use   Vaping status: Never Used  Substance Use Topics   Alcohol use: No   Drug use: No   Family History  Problem Relation Age of Onset   Lung cancer Father    High blood pressure Father    Colon cancer Neg Hx    Colon polyps Neg Hx    Allergies  Allergen Reactions   Dye Fdc Blue [Brilliant Blue Fcf (Fd&C Blue #1)]     PT UNSURE  WHICH DYE   Iodinated Contrast Media Hives    30 YEARS AGO, recent 2021 epidural with contrast ok      ROS Negative unless stated above    Objective:     BP 130/72 (BP Location: Left Arm, Patient Position: Sitting, Cuff Size: Large)   Pulse (!) 55   Temp 98.6 F (37 C) (Oral)   Ht 5\' 10"  (1.778 m)   Wt 201 lb 6.4 oz (91.4 kg)   SpO2 99%   BMI 28.90 kg/m  BP Readings from Last 3 Encounters:  10/17/23 130/72  10/03/23 122/70  08/08/23 (!) 145/77   Wt Readings from Last 3 Encounters:  10/17/23 201 lb 6.4 oz (91.4 kg)  10/03/23 202 lb 3.2 oz (91.7 kg)  08/14/23 196 lb (88.9 kg)      Physical Exam Constitutional:      General: He is not in acute distress.    Appearance: Normal appearance.  HENT:     Head: Normocephalic and atraumatic.     Nose: Nose normal.     Mouth/Throat:     Mouth: Mucous membranes are moist.  Cardiovascular:     Rate and Rhythm: Normal rate and regular rhythm.     Heart sounds: Normal heart sounds. No murmur heard.    No gallop.  Pulmonary:     Effort: Pulmonary effort is normal. No respiratory distress.     Breath sounds: Normal breath sounds. No wheezing, rhonchi or rales.  Genitourinary:    Comments: A small 6 mm round firm area palpated deep in right medial groin.  No obvious edema, erythema or tenderness. Skin:    General: Skin is warm and dry.  Neurological:     Mental Status: He is alert and oriented to person, place, and time.      Results for orders placed or performed in visit on 10/17/23  POC HgB A1c  Result Value Ref Range   Hemoglobin A1C 5.1 4.0 - 5.6 %   HbA1c POC (<> result, manual entry)     HbA1c, POC (prediabetic range)     HbA1c, POC (controlled diabetic range)        Assessment & Plan:  Right groin pain -Concern for possible hernia vs muscle strain. -no hernia like masses palpated on exam.  Possible lymph node appreciated. -Ultrasound ordered.  Type 2 diabetes mellitus with stage 4 chronic kidney  disease, without long-term current use of insulin (HCC) -Diet controlled -Hemoglobin A1c 5.1% this visit -     POCT glycosylated hemoglobin (Hb A1C)  Chronic kidney disease, stage 5 (HCC) -eGFR 10 on 06/20/2023, creatinine 5.85 on 08/13/2023 -Plans to start peritoneal dialysis in the near future -On renal [facility] list with [facility] -Avoid for toxic meds and renally dose medications -Continue follow-up with nephrology  Seasonal and perennial allergic rhinitis -  Renal adjust dose of Allegra.         -      Allegra 60 mg daily  Return if symptoms worsen or fail to improve.   Deeann Saint, MD

## 2023-10-17 NOTE — Telephone Encounter (Signed)
Called patient.  Patient states he just needs a CPAP compliance check to continue his CPAP usage with his insurance company.  He has an office visit scheduled with Dr. Maple Hudson on 11/26/2023 at 3:30pm.  Patient states nothing further needed at this time.

## 2023-10-17 NOTE — Telephone Encounter (Signed)
Pt is returning CMA's call  CMA was unavailble Please return Pt's call.

## 2023-10-19 ENCOUNTER — Encounter (HOSPITAL_COMMUNITY): Payer: Medicare Other

## 2023-10-22 ENCOUNTER — Ambulatory Visit: Payer: Medicare Other | Admitting: Podiatry

## 2023-10-25 ENCOUNTER — Other Ambulatory Visit (HOSPITAL_COMMUNITY): Payer: Self-pay

## 2023-10-29 ENCOUNTER — Ambulatory Visit: Payer: Medicare Other | Admitting: Podiatry

## 2023-10-29 ENCOUNTER — Ambulatory Visit (HOSPITAL_COMMUNITY)
Admission: RE | Admit: 2023-10-29 | Discharge: 2023-10-29 | Disposition: A | Discharge status: Home / Self Care | Payer: Medicare Other | Source: Ambulatory Visit | Attending: Nephrology | Admitting: Nephrology

## 2023-10-29 DIAGNOSIS — D631 Anemia in chronic kidney disease: Secondary | ICD-10-CM | POA: Insufficient documentation

## 2023-10-29 DIAGNOSIS — N189 Chronic kidney disease, unspecified: Secondary | ICD-10-CM | POA: Insufficient documentation

## 2023-10-29 MED ORDER — SODIUM CHLORIDE 0.9 % IV SOLN
510.0000 mg | INTRAVENOUS | Status: DC
  Administered 2023-10-29: 510 mg via INTRAVENOUS
  Filled 2023-10-29: qty 510

## 2023-11-05 ENCOUNTER — Ambulatory Visit (HOSPITAL_COMMUNITY)
Admission: RE | Admit: 2023-11-05 | Discharge: 2023-11-05 | Disposition: A | Discharge status: Home / Self Care | Payer: Medicare Other | Source: Ambulatory Visit | Attending: Nephrology | Admitting: Nephrology

## 2023-11-05 DIAGNOSIS — N189 Chronic kidney disease, unspecified: Secondary | ICD-10-CM | POA: Diagnosis present

## 2023-11-05 DIAGNOSIS — D631 Anemia in chronic kidney disease: Secondary | ICD-10-CM | POA: Diagnosis not present

## 2023-11-05 MED ORDER — SODIUM CHLORIDE 0.9 % IV SOLN
510.0000 mg | INTRAVENOUS | Status: DC
  Administered 2023-11-05: 510 mg via INTRAVENOUS
  Filled 2023-11-05: qty 510

## 2023-11-08 ENCOUNTER — Encounter: Payer: Self-pay | Admitting: Podiatry

## 2023-11-08 ENCOUNTER — Ambulatory Visit (INDEPENDENT_AMBULATORY_CARE_PROVIDER_SITE_OTHER): Payer: Medicare Other

## 2023-11-08 ENCOUNTER — Ambulatory Visit: Payer: Medicare Other | Admitting: Podiatry

## 2023-11-08 DIAGNOSIS — M778 Other enthesopathies, not elsewhere classified: Secondary | ICD-10-CM

## 2023-11-08 DIAGNOSIS — M7742 Metatarsalgia, left foot: Secondary | ICD-10-CM | POA: Diagnosis not present

## 2023-11-08 DIAGNOSIS — M7741 Metatarsalgia, right foot: Secondary | ICD-10-CM

## 2023-11-08 NOTE — Progress Notes (Signed)
Subjective: Chief Complaint  Patient presents with   Foot Pain    RM#14 Bilateral foot pain X 8-9 months shooting pain on occasion .   72 year old male presents the office for above concerns.  He gets pain in both of his feet but not the same time.  Points mostly on the fifth MTPJ where he gets discomfort.  Sometimes they just ache.  No injuries he reports.  Does not radiate.  He states it stays localized.  No recent treatment.  Left side is worse than the right.  He does have some back pain he has a history of 6 herniated disc.  He previously had injection therapy.   Objective: AAO x3, NAD DP/PT pulses palpable bilaterally, CRT less than 3 seconds The joint tenderness is localized along the fifth MTPJ but he does get diffuse discomfort submetatarsal area.  There is no palpable neuroma identified, there is no area pinpoint tenderness.  There is no edema, erythema. No pain with calf compression, swelling, warmth, erythema  Assessment: Metatarsalgia, neuritis  Plan: -All treatment options discussed with the patient including all alternatives, risks, complications.  -X-rays obtained reviewed.  No evidence of acute fracture.  Multiple views bilateral feet were obtained.  Accessory sesamoids noted on the fifth MPJ bilaterally although do not think is causing symptoms. -While he could have a component of some nerve issues possibly coming from his back he has been treated for this previously.  If the symptoms continue recommend follow with his PCP or other treating provider for this.  Discussed shoes, good arch support first penile discussed with him gel metatarsal pads which I dispensed.  I also added a metatarsal pad to his current insert to see if this will help and if he does not wear the insert I would wear the metatarsal gel pad. -Patient encouraged to call the office with any questions, concerns, change in symptoms.   Vivi Barrack DPM

## 2023-11-17 ENCOUNTER — Other Ambulatory Visit: Payer: Self-pay | Admitting: Family Medicine

## 2023-11-17 ENCOUNTER — Ambulatory Visit
Admission: EM | Admit: 2023-11-17 | Discharge: 2023-11-17 | Disposition: A | Discharge status: Home / Self Care | Payer: Medicare Other | Attending: Family Medicine | Admitting: Family Medicine

## 2023-11-17 ENCOUNTER — Ambulatory Visit
Admission: EM | Admit: 2023-11-17 | Discharge: 2023-11-17 | Disposition: A | Discharge status: Home / Self Care | Payer: Medicare Other

## 2023-11-17 DIAGNOSIS — Z87442 Personal history of urinary calculi: Secondary | ICD-10-CM

## 2023-11-17 DIAGNOSIS — R109 Unspecified abdominal pain: Secondary | ICD-10-CM | POA: Diagnosis not present

## 2023-11-17 DIAGNOSIS — R3129 Other microscopic hematuria: Secondary | ICD-10-CM | POA: Diagnosis not present

## 2023-11-17 DIAGNOSIS — I10 Essential (primary) hypertension: Secondary | ICD-10-CM

## 2023-11-17 DIAGNOSIS — N184 Chronic kidney disease, stage 4 (severe): Secondary | ICD-10-CM

## 2023-11-17 LAB — POCT URINALYSIS DIP (MANUAL ENTRY)
Bilirubin, UA: NEGATIVE
Glucose, UA: NEGATIVE mg/dL
Ketones, POC UA: NEGATIVE mg/dL
Leukocytes, UA: NEGATIVE
Nitrite, UA: NEGATIVE
Protein Ur, POC: >=300 mg/dL — AB
Spec Grav, UA: 1.02 (ref 1.010–1.025)
Urobilinogen, UA: 0.2 U/dL
pH, UA: 7 (ref 5.0–8.0)

## 2023-11-17 MED ORDER — TRAMADOL HCL 50 MG PO TABS: 50.0000 mg | ORAL_TABLET | Freq: Three times a day (TID) | ORAL | 0 refills | Status: DC | PRN

## 2023-11-17 NOTE — ED Triage Notes (Signed)
Pt states right lower back pain for the past 2 days.  States he is taking tylenol at home with no relief.

## 2023-11-17 NOTE — ED Triage Notes (Signed)
  Pt states right lower back pain for the past 2 days.  States he is taking tylenol at home with no relief.

## 2023-11-17 NOTE — Discharge Instructions (Addendum)
I suspect you may have a kidney stone.  You are already on Flomax which helps to dilate the urinary tubes and pass stones so continue taking this and I have sent over some pain medication.  You may take some additional Tylenol as needed and drink plenty of fluids.  Follow-up as soon as possible with your primary care provider.  We have gotten some blood work today to check your kidney function and we will let you know if anything comes back abnormal.  Go to the emergency department for severely worsening symptoms at any time.

## 2023-11-17 NOTE — ED Provider Notes (Signed)
[facility]    CSN: [id number] Arrival date & time: 11/17/23  1306      History   Chief Complaint Chief Complaint  Patient presents with   Back Pain    HPI Bruce Weber. is a 72 y.o. male.   Patient presenting today with 2-day history of severe constant right flank pain now since this morning wrapping around to the anterior aspect as well.  Denies associated fevers, chills, diarrhea, constipation, injury, radiation of pain, weakness numbness or tingling of extremities.  Did have an episode of nausea and vomiting this morning when the pain was severe.  He has a history of kidney stones as well as stage IV chronic kidney disease awaiting a kidney transplant.  He also has a history of spinal stenosis and chronic back pain but states this does not feel similar.  Taking Tylenol with minimal relief.    Past Medical History:  Diagnosis Date   Adenoma 11/03/2009   1.1 cm from TCS   DM (diabetes mellitus) (HCC)    GERD (gastroesophageal reflux disease)    High cholesterol    History of kidney stones    HTN (hypertension)    Kidney failure    stage 4   Kidney stones    Obesity (BMI 30-39.9) DEC 2011 223 LBS   Rectal bleeding 12/04/2009   secondary to hemorrhoids    Patient Active Problem List   Diagnosis Date Noted   Internal hemorrhoids 07/12/2023   Benign prostatic hyperplasia with urinary hesitancy 01/11/2022   History of hernia repair 01/11/2022   Chronic rhinitis 07/27/2021   History of kidney stones 01/26/2021   Pre-diabetes 01/26/2021   Pre-transplant evaluation for kidney transplant 01/26/2021   Essential hypertension 09/07/2020   Diet-controlled diabetes mellitus (HCC) 09/07/2020   CKD (chronic kidney disease) stage 4, GFR 15-29 ml/min (HCC) 09/07/2020   SOB (shortness of breath) on exertion 09/07/2020   Foraminal stenosis of lumbar region 05/05/2020   Colon adenomas    Insomnia 01/11/2016   Internal hemorrhoids with complication 12/18/2013    Obstructive sleep apnea 07/27/2013   Seasonal and perennial allergic rhinitis 07/27/2013   Hx of colonic polyps 11/15/2012   UNSPECIFIED DISEASE OF THE JAWS 03/11/2010   COLONIC POLYPS, ADENOMATOUS 06/30/2009   HYPERLIPIDEMIA 10/13/2008   GLOBUS HYSTERICUS 10/13/2008   GASTROESOPHAGEAL REFLUX DISEASE 10/13/2008   RENAL CALCULUS 10/13/2008   Dysphagia, idiopathic 10/13/2008   ABDOMINAL PAIN 10/13/2008   H N P-LUMBAR 06/23/2008   SPINAL STENOSIS 06/23/2008   Low back pain 06/23/2008    Past Surgical History:  Procedure Laterality Date   CIRCUMCISION     COLONOSCOPY  Dec 2010 [facility]   MOD IH, SIMPLE ADENOMA 1.1 CM   COLONOSCOPY  11/25/2012   SLF: 1. Sessile polyp measuring 6mm in size was found at the hepatic flesure; polypectomy was performed using snare cautery 2. Moderate diverticulosis was noted in the ascending colon and sigmoid colon. 3. The colon mucosa was otherwise normal 4. Large internal hemorrhoids.    COLONOSCOPY N/A 03/01/2017   Procedure: COLONOSCOPY;  Surgeon: West Bali, MD;  [facility];  Service: Endoscopy;  Laterality: N/A;  10:30 AM   COLONOSCOPY WITH PROPOFOL N/A 04/14/2022   Procedure: COLONOSCOPY WITH PROPOFOL;  Surgeon: Lanelle Bal, DO;  [facility]: [facility];  Service: Endoscopy;  Laterality: N/A;  11:00am   EYE SURGERY  05/2018   bilateral cataract surgery   HERNIA REPAIR     POLYPECTOMY  04/14/2022  Procedure: POLYPECTOMY;  Surgeon: Lanelle Bal, DO;  [facility]: [facility];  Service: Endoscopy;;       Home Medications    Prior to Admission medications   Medication Sig Start Date End Date Taking? Authorizing Provider  traMADol (ULTRAM) 50 MG tablet Take 1 tablet (50 mg total) by mouth 3 (three) times daily as needed. 11/17/23  Yes Particia Nearing, PA-C  acetaminophen (TYLENOL) 325 MG tablet Take 2 tablets (650 mg total) by mouth every 6 (six) hours as needed for moderate pain. 01/26/22   Wallis Bamberg, PA-C   allopurinol (ZYLOPRIM) 100 MG tablet Take 300 mg by mouth daily.     [provider]  amLODipine (NORVASC) 10 MG tablet TAKE 1 TABLET BY MOUTH EVERYDAY AT BEDTIME 10/09/23   Deeann Saint, MD  atorvastatin (LIPITOR) 10 MG tablet TAKE 1 TABLET BY MOUTH EVERY DAY (NEED PHYSICAL) 10/09/23   Deeann Saint, MD  benzonatate (TESSALON) 100 MG capsule Take 1 capsule (100 mg total) by mouth 2 (two) times daily as needed for cough. 10/03/23   Deeann Saint, MD  calcitRIOL (ROCALTROL) 0.25 MCG capsule Take 0.25 mcg by mouth at bedtime.     [provider]  famotidine (PEPCID) 40 MG tablet TAKE 1 TABLET (40 MG TOTAL) BY MOUTH DAILY. WITH BREAKFAST 12/07/22   Lanelle Bal, DO  fexofenadine [facility] ALLERGY) 60 MG tablet Take 1 tablet (60 mg total) by mouth daily. 10/17/23   Deeann Saint, MD  fluticasone (FLONASE) 50 MCG/ACT nasal spray Place 1 spray into both nostrils daily for 14 days. Patient taking differently: Place 1 spray into both nostrils at bedtime as needed for allergies. 09/29/20 08/08/23  Avegno, Zachery Dakins, FNP  furosemide (LASIX) 40 MG tablet Take 40 mg by mouth daily.    [provider]  gabapentin (NEURONTIN) 100 MG capsule TAKE 1 CAPSULE BY MOUTH THREE TIMES A DAY 07/09/23   Darreld Mclean, MD  hydrALAZINE (APRESOLINE) 25 MG tablet TAKE 1 (ONE) TABLET THREE TIMES DAILY 04/03/23   Deeann Saint, MD  promethazine-dextromethorphan (PROMETHAZINE-DM) 6.25-15 MG/5ML syrup Take 5 mLs by mouth 4 (four) times daily as needed. 12/11/22   Particia Nearing, PA-C  propranolol (INDERAL) 80 MG tablet TAKE 1 TABLET BY MOUTH TWICE A DAY 08/20/23   Deeann Saint, MD  rosuvastatin (CRESTOR) 5 MG tablet Take 5 mg by mouth daily.    [provider]  tamsulosin (FLOMAX) 0.4 MG CAPS capsule Take 0.4 mg by mouth daily. 05/25/20   [provider]  traZODone (DESYREL) 50 MG tablet TAKE 1 TABLET BY MOUTH EVERYDAY AT BEDTIME Patient taking differently: 50 mg.  05/25/22   Waymon Budge, MD    Family History Family History  Problem Relation Age of Onset   Lung cancer Father    High blood pressure Father    Colon cancer Neg Hx    Colon polyps Neg Hx     Social History Social History   Tobacco Use   Smoking status: Never    Passive exposure: Never   Smokeless tobacco: Never   Tobacco comments:    Never smoked  Vaping Use   Vaping status: Never Used  Substance Use Topics   Alcohol use: No   Drug use: No     Allergies   Dye fdc blue [brilliant blue fcf (fd&c blue #1)] and Iodinated contrast media   Review of Systems Review of Systems Per HPI  Physical Exam Triage Vital Signs ED  Triage Vitals  Encounter Vitals Group     BP 11/17/23 1407 (!) 181/85     Systolic BP Percentile --      Diastolic BP Percentile --      Pulse Rate 11/17/23 1407 61     Resp 11/17/23 1407 16     Temp 11/17/23 1407 97.8 F (36.6 C)     Temp Source 11/17/23 1407 Oral     SpO2 --      Weight --      Height --      Head Circumference --      Peak Flow --      Pain Score 11/17/23 1407 7     Pain Loc --      Pain Education --      Exclude from Growth Chart --    No data found.  Updated Vital Signs BP (!) 181/85 (BP Location: Right Arm)   Pulse 61   Temp 97.8 F (36.6 C) (Oral)   Resp 16   Visual Acuity Right Eye Distance:   Left Eye Distance:   Bilateral Distance:    Right Eye Near:   Left Eye Near:    Bilateral Near:     Physical Exam Vitals and nursing note reviewed.  Constitutional:      Appearance: Normal appearance.  HENT:     Head: Atraumatic.  Eyes:     Extraocular Movements: Extraocular movements intact.     Conjunctiva/sclera: Conjunctivae normal.  Cardiovascular:     Rate and Rhythm: Normal rate and regular rhythm.  Pulmonary:     Effort: Pulmonary effort is normal.     Breath sounds: Normal breath sounds.  Abdominal:     General: Bowel sounds are normal. There is no distension.     Palpations: Abdomen is  soft.     Tenderness: There is no abdominal tenderness. There is no right CVA tenderness, left CVA tenderness or guarding.  Musculoskeletal:        General: No swelling, tenderness or signs of injury. Normal range of motion.     Cervical back: Normal range of motion and neck supple.     Comments: No midline spinal tenderness to palpation diffusely.  Negative straight leg raise bilateral lower extremities.  Skin:    General: Skin is warm and dry.  Neurological:     General: No focal deficit present.     Mental Status: He is oriented to person, place, and time.     Motor: No weakness.     Gait: Gait normal.     Comments: All 4 extremities neurovascularly intact  Psychiatric:        Mood and Affect: Mood normal.        Thought Content: Thought content normal.        Judgment: Judgment normal.      UC Treatments / Results  Labs (all labs ordered are listed, but only abnormal results are displayed) Labs Reviewed  POCT URINALYSIS DIP (MANUAL ENTRY) - Abnormal; Notable for the following components:      Result Value   Blood, UA moderate (*)    Protein Ur, POC >=300 (*)    All other components within normal limits  BASIC METABOLIC PANEL    EKG   Radiology No results found.  Procedures Procedures (including critical care time)  Medications Ordered in UC Medications - No data to display  Initial Impression / Assessment and Plan / UC Course  I have reviewed the triage vital signs and  the nursing notes.  Pertinent labs & imaging results that were available during my care of the patient were reviewed by me and considered in my medical decision making (see chart for details).     Pain is not reproduced with movement or palpation and is sharp and constant.  Given location of pain, hematuria on urinalysis and history of kidney stones this is my suspicion at this time.  Is unable to take NSAID medications so we will treat with tramadol, fluids and he is already on Flomax to  continue this medication.  Close PCP follow-up first thing next week, ER for worsening symptoms.  BMP pending for his as he is concerned about his kidney function given location of pain.  Final Clinical Impressions(s) / UC Diagnoses   Final diagnoses:  Right flank pain  Other microscopic hematuria  History of kidney stones  Stage 4 chronic kidney disease (HCC)     Discharge Instructions      I suspect you may have a kidney stone.  You are already on Flomax which helps to dilate the urinary tubes and pass stones so continue taking this and I have sent over some pain medication.  You may take some additional Tylenol as needed and drink plenty of fluids.  Follow-up as soon as possible with your primary care provider.  We have gotten some blood work today to check your kidney function and we will let you know if anything comes back abnormal.  Go to the emergency department for severely worsening symptoms at any time.    ED Prescriptions     Medication Sig Dispense Auth. Provider   traMADol (ULTRAM) 50 MG tablet Take 1 tablet (50 mg total) by mouth 3 (three) times daily as needed. 15 tablet Particia Nearing, [place]      I have reviewed the PDMP during this encounter.   Particia Nearing, [place] 11/17/23 1445

## 2023-11-17 NOTE — ED Notes (Signed)
Pt waiting in car. Called pt on his phone to come to a room,call went to voicemail.  Left a message for patient.

## 2023-11-17 NOTE — ED Notes (Signed)
Called pt 4 times via phone. Got sent to VM. Called pt from waiting room 2 times no answer. Pt discharged as LWBSBT

## 2023-11-18 LAB — BASIC METABOLIC PANEL
BUN/Creatinine Ratio: 9 — ABNORMAL LOW (ref 10–24)
BUN: 59 mg/dL — ABNORMAL HIGH (ref 8–27)
CO2: 15 mmol/L — ABNORMAL LOW (ref 20–29)
Calcium: 9.3 mg/dL (ref 8.6–10.2)
Chloride: 109 mmol/L — ABNORMAL HIGH (ref 96–106)
Creatinine, Ser: 6.85 mg/dL — ABNORMAL HIGH (ref 0.76–1.27)
Glucose: 106 mg/dL — ABNORMAL HIGH (ref 70–99)
Potassium: 5.6 mmol/L — ABNORMAL HIGH (ref 3.5–5.2)
Sodium: 141 mmol/L (ref 134–144)
eGFR: 8 mL/min/{1.73_m2} — ABNORMAL LOW (ref >59)

## 2023-11-19 ENCOUNTER — Encounter (HOSPITAL_COMMUNITY): Payer: Self-pay

## 2023-11-19 ENCOUNTER — Inpatient Hospital Stay (HOSPITAL_COMMUNITY)
Admission: EM | Admit: 2023-11-19 | Discharge: 2023-11-23 | DRG: 683 | Disposition: A | Discharge status: Home / Self Care | Payer: Medicare Other | Attending: Internal Medicine | Admitting: Internal Medicine

## 2023-11-19 ENCOUNTER — Other Ambulatory Visit: Payer: Self-pay

## 2023-11-19 ENCOUNTER — Emergency Department (HOSPITAL_COMMUNITY): Payer: Medicare Other

## 2023-11-19 DIAGNOSIS — Z6827 Body mass index (BMI) 27.0-27.9, adult: Secondary | ICD-10-CM | POA: Diagnosis not present

## 2023-11-19 DIAGNOSIS — N179 Acute kidney failure, unspecified: Secondary | ICD-10-CM | POA: Diagnosis present

## 2023-11-19 DIAGNOSIS — N281 Cyst of kidney, acquired: Secondary | ICD-10-CM | POA: Diagnosis present

## 2023-11-19 DIAGNOSIS — N200 Calculus of kidney: Secondary | ICD-10-CM

## 2023-11-19 DIAGNOSIS — E875 Hyperkalemia: Secondary | ICD-10-CM | POA: Diagnosis present

## 2023-11-19 DIAGNOSIS — N2889 Other specified disorders of kidney and ureter: Secondary | ICD-10-CM | POA: Diagnosis not present

## 2023-11-19 DIAGNOSIS — E7800 Pure hypercholesterolemia, unspecified: Secondary | ICD-10-CM | POA: Diagnosis present

## 2023-11-19 DIAGNOSIS — I10 Essential (primary) hypertension: Secondary | ICD-10-CM

## 2023-11-19 DIAGNOSIS — Z79899 Other long term (current) drug therapy: Secondary | ICD-10-CM | POA: Diagnosis not present

## 2023-11-19 DIAGNOSIS — N1832 Chronic kidney disease, stage 3b: Secondary | ICD-10-CM

## 2023-11-19 DIAGNOSIS — N184 Chronic kidney disease, stage 4 (severe): Secondary | ICD-10-CM | POA: Diagnosis not present

## 2023-11-19 DIAGNOSIS — Z7682 Awaiting organ transplant status: Secondary | ICD-10-CM

## 2023-11-19 DIAGNOSIS — Z801 Family history of malignant neoplasm of trachea, bronchus and lung: Secondary | ICD-10-CM

## 2023-11-19 DIAGNOSIS — I120 Hypertensive chronic kidney disease with stage 5 chronic kidney disease or end stage renal disease: Secondary | ICD-10-CM | POA: Diagnosis present

## 2023-11-19 DIAGNOSIS — N185 Chronic kidney disease, stage 5: Secondary | ICD-10-CM | POA: Diagnosis present

## 2023-11-19 DIAGNOSIS — R310 Gross hematuria: Secondary | ICD-10-CM | POA: Diagnosis present

## 2023-11-19 DIAGNOSIS — K219 Gastro-esophageal reflux disease without esophagitis: Secondary | ICD-10-CM | POA: Diagnosis present

## 2023-11-19 DIAGNOSIS — D631 Anemia in chronic kidney disease: Secondary | ICD-10-CM | POA: Diagnosis present

## 2023-11-19 DIAGNOSIS — E1122 Type 2 diabetes mellitus with diabetic chronic kidney disease: Secondary | ICD-10-CM | POA: Diagnosis present

## 2023-11-19 DIAGNOSIS — E669 Obesity, unspecified: Secondary | ICD-10-CM | POA: Diagnosis present

## 2023-11-19 DIAGNOSIS — Z91041 Radiographic dye allergy status: Secondary | ICD-10-CM

## 2023-11-19 DIAGNOSIS — N400 Enlarged prostate without lower urinary tract symptoms: Secondary | ICD-10-CM | POA: Diagnosis present

## 2023-11-19 DIAGNOSIS — N132 Hydronephrosis with renal and ureteral calculous obstruction: Secondary | ICD-10-CM | POA: Diagnosis present

## 2023-11-19 DIAGNOSIS — E872 Acidosis, unspecified: Secondary | ICD-10-CM | POA: Diagnosis present

## 2023-11-19 DIAGNOSIS — I951 Orthostatic hypotension: Secondary | ICD-10-CM | POA: Diagnosis present

## 2023-11-19 LAB — URINALYSIS, ROUTINE W REFLEX MICROSCOPIC
Bacteria, UA: NONE SEEN
Bilirubin Urine: NEGATIVE
Glucose, UA: NEGATIVE mg/dL
Ketones, ur: NEGATIVE mg/dL
Leukocytes,Ua: NEGATIVE
Nitrite: NEGATIVE
Protein, ur: 100 mg/dL — AB
Specific Gravity, Urine: 1.008 (ref 1.005–1.030)
pH: 7 (ref 5.0–8.0)

## 2023-11-19 LAB — TROPONIN I (HIGH SENSITIVITY)
Troponin I (High Sensitivity): 11 ng/L (ref <18)
Troponin I (High Sensitivity): 10 ng/L (ref <18)

## 2023-11-19 LAB — CBC
HCT: 31.3 % — ABNORMAL LOW (ref 39.0–52.0)
Hemoglobin: 10.3 g/dL — ABNORMAL LOW (ref 13.0–17.0)
MCH: 32.7 pg (ref 26.0–34.0)
MCHC: 32.9 g/dL (ref 30.0–36.0)
MCV: 99.4 fL (ref 80.0–100.0)
Platelets: 170 10*3/uL (ref 150–400)
RBC: 3.15 MIL/uL — ABNORMAL LOW (ref 4.22–5.81)
RDW: 14.9 % (ref 11.5–15.5)
WBC: 8 10*3/uL (ref 4.0–10.5)
nRBC: 0 % (ref 0.0–0.2)

## 2023-11-19 LAB — BASIC METABOLIC PANEL
Anion gap: 8 (ref 5–15)
BUN: 67 mg/dL — ABNORMAL HIGH (ref 8–23)
CO2: 18 mmol/L — ABNORMAL LOW (ref 22–32)
Calcium: 9.1 mg/dL (ref 8.9–10.3)
Chloride: 113 mmol/L — ABNORMAL HIGH (ref 98–111)
Creatinine, Ser: 7.41 mg/dL — ABNORMAL HIGH (ref 0.61–1.24)
GFR, Estimated: 7 mL/min — ABNORMAL LOW (ref >60)
Glucose, Bld: 112 mg/dL — ABNORMAL HIGH (ref 70–99)
Potassium: 5.1 mmol/L (ref 3.5–5.1)
Sodium: 139 mmol/L (ref 135–145)

## 2023-11-19 MED ORDER — HEPARIN SODIUM (PORCINE) 5000 UNIT/ML IJ SOLN
5000.0000 [IU] | Freq: Three times a day (TID) | INTRAMUSCULAR | Status: DC
  Administered 2023-11-19 – 2023-11-23 (×11): 5000 [IU] via SUBCUTANEOUS
  Filled 2023-11-19 (×11): qty 1

## 2023-11-19 MED ORDER — TRAZODONE HCL 50 MG PO TABS
50.0000 mg | ORAL_TABLET | Freq: Every day | ORAL | Status: DC
  Administered 2023-11-21 – 2023-11-22 (×3): 50 mg via ORAL
  Filled 2023-11-19 (×4): qty 1

## 2023-11-19 MED ORDER — GABAPENTIN 100 MG PO CAPS: 100.0000 mg | ORAL_CAPSULE | Freq: Three times a day (TID) | ORAL | Status: DC | PRN

## 2023-11-19 MED ORDER — TAMSULOSIN HCL 0.4 MG PO CAPS: 0.4000 mg | ORAL_CAPSULE | Freq: Every day | ORAL | Status: DC

## 2023-11-19 MED ORDER — HYDRALAZINE HCL 25 MG PO TABS
25.0000 mg | ORAL_TABLET | Freq: Three times a day (TID) | ORAL | Status: DC
  Administered 2023-11-19 – 2023-11-21 (×6): 25 mg via ORAL
  Filled 2023-11-19 (×6): qty 1

## 2023-11-19 MED ORDER — POLYETHYLENE GLYCOL 3350 17 G PO PACK
17.0000 g | PACK | Freq: Every day | ORAL | Status: DC
  Administered 2023-11-19 – 2023-11-23 (×5): 17 g via ORAL
  Filled 2023-11-19 (×5): qty 1

## 2023-11-19 MED ORDER — PROPRANOLOL HCL 10 MG PO TABS
80.0000 mg | ORAL_TABLET | Freq: Two times a day (BID) | ORAL | Status: DC
  Administered 2023-11-20 – 2023-11-23 (×6): 80 mg via ORAL
  Filled 2023-11-19 (×4): qty 8
  Filled 2023-11-19: qty 4
  Filled 2023-11-19 (×2): qty 8
  Filled 2023-11-19: qty 4
  Filled 2023-11-19 (×7): qty 8

## 2023-11-19 MED ORDER — TAMSULOSIN HCL 0.4 MG PO CAPS
0.8000 mg | ORAL_CAPSULE | Freq: Every day | ORAL | Status: DC
  Administered 2023-11-20 – 2023-11-23 (×4): 0.8 mg via ORAL
  Filled 2023-11-19 (×4): qty 2

## 2023-11-19 MED ORDER — SODIUM CHLORIDE 0.9 % IV BOLUS
500.0000 mL | Freq: Once | INTRAVENOUS | Status: AC
  Administered 2023-11-19: 500 mL via INTRAVENOUS

## 2023-11-19 MED ORDER — CALCITRIOL 0.25 MCG PO CAPS
0.2500 ug | ORAL_CAPSULE | Freq: Every day | ORAL | Status: DC
  Administered 2023-11-19 – 2023-11-22 (×4): 0.25 ug via ORAL
  Filled 2023-11-19 (×4): qty 1

## 2023-11-19 MED ORDER — ACETAMINOPHEN 325 MG PO TABS: 650.0000 mg | ORAL_TABLET | Freq: Four times a day (QID) | ORAL | Status: DC | PRN

## 2023-11-19 MED ORDER — AMLODIPINE BESYLATE 5 MG PO TABS
10.0000 mg | ORAL_TABLET | Freq: Every day | ORAL | Status: DC
  Administered 2023-11-20 – 2023-11-22 (×3): 10 mg via ORAL
  Filled 2023-11-19 (×4): qty 2

## 2023-11-19 MED ORDER — ACETAMINOPHEN 650 MG RE SUPP: 650.0000 mg | Freq: Four times a day (QID) | RECTAL | Status: DC | PRN

## 2023-11-19 NOTE — H&P (Signed)
History and Physical    Bruce Weber. NWG:[id number] DOB: 1951-02-17 DOA: 11/19/2023 PCP: Deeann Saint, MD  Chief Complaint: abnormal labs Historian: patient  HPI:  Bruce Weber. is a 72 y.o. male with a PMH significant for HLD, GERD, renal stones, spinal stenosis, low back pain, dysphagia, OSA, hemorrhoids, insomnia, HTN, diet-controlled diabetes, CKD stage IV awaiting transplant, BPH. At baseline, they live at home with her wife and and are completely independent with ADLs.  They presented from home to the ED on 11/19/2023 due to the recommendation of the urgent care who processed his labs on 12/14 and observed worsening renal function as well as hematuria. He states that he presented to the urgent care on Saturday due to having right lower flank pain and was treated with tramadol.  Denies any other new medications or over-the-counter's.  His pain since then has resolved.  In review of his chart, he mentioned right groin pain for several days prior to his last PCP appointment on 11/13. He denies any decrease in p.o. intake but admits to poor hydration. At the time of my evaluation, he has no complaints and feels like he is at his normal baseline. He denies any dysuria, hematuria, flank pain, nausea.  He feels that his urine output is stable from his baseline.   He took his morning chronic medications this morning.  He follows up with nephrology outpatient and is awaiting kidney transplant.  In the ED, it was found that they had hypertension with systolics in the 170s, heart rate in the 50s, normal temperature and 100% oxygen saturation on room air..  Significant findings included: Na+ 139, K+ 5.1, glucose 112, BUN 67, creatinine 7.4 (baseline appears to be closer to 5), GFR 7.  CBC appears to be at baseline. CT renal stone study: 2 x 3 x 6 mm stone in mid right ureter with mild right hydroureteronephrosis, 6 mm nonobstructing stone interpolar right kidney.  Also multiple lesions of  bilateral kidneys noted with interval growth from last imaging which could be consistent with cysts versus neoplasm.  They were initially treated with 500 mL normal saline bolus.   Patient was admitted to medicine service for further workup and management of AKI on CKD 4 as outlined in detail below.  Assessment/Plan Principal Problem:   AKI (acute kidney injury) (HCC) Active Problems:   Orthostatic hypotension   Stage 3b chronic kidney disease (HCC)   AKI on CKD 4-currently follows outpatient with nephrology and awaiting transplant.  Denies any changes in his urine output recently. Baseline creatinine appears to be around 5.  Upon presentation is 7.4 -Nephrology consulted by EDP and recommends admission -S/p 500 mL bolus -BMP a.m. -Holding nephrotoxic medications -Urinalysis pending -Consider renal MRI versus urology consult for the interval growth of lesions in bilateral kidneys seen on his CT renal stone study  Nephrolithiasis-2 x 3 x 6 mm stone in mid right ureter with mild right hydroureteronephrosis, 6 mm nonobstructing stone interpolar right kidney.  History of several renal stones.  He is currently asymptomatic -Continue Flomax at increased dose -Consider urology consult  HTN-poorly controlled. -Continue home medications including hydralazine, amlodipine  Past Medical History:  Diagnosis Date   Adenoma 11/03/2009   1.1 cm from TCS   DM (diabetes mellitus) (HCC)    GERD (gastroesophageal reflux disease)    High cholesterol    History of kidney stones    HTN (hypertension)    Kidney failure    stage 4  Kidney stones    Obesity (BMI 30-39.9) DEC 2011 223 LBS   Rectal bleeding 12/04/2009   secondary to hemorrhoids   Past Surgical History:  Procedure Laterality Date   CIRCUMCISION     COLONOSCOPY  Dec 2010 [facility]   MOD IH, SIMPLE ADENOMA 1.1 CM   COLONOSCOPY  11/25/2012   SLF: 1. Sessile polyp measuring 6mm in size was found at the hepatic flesure; polypectomy was  performed using snare cautery 2. Moderate diverticulosis was noted in the ascending colon and sigmoid colon. 3. The colon mucosa was otherwise normal 4. Large internal hemorrhoids.    COLONOSCOPY N/A 03/01/2017   Procedure: COLONOSCOPY;  Surgeon: West Bali, MD;  [facility];  Service: Endoscopy;  Laterality: N/A;  10:30 AM   COLONOSCOPY WITH PROPOFOL N/A 04/14/2022   Procedure: COLONOSCOPY WITH PROPOFOL;  Surgeon: Lanelle Bal, DO;  [facility];  Service: Endoscopy;  Laterality: N/A;  11:00am   EYE SURGERY  05/2018   bilateral cataract surgery   HERNIA REPAIR     POLYPECTOMY  04/14/2022   Procedure: POLYPECTOMY;  Surgeon: Lanelle Bal, DO;  [facility];  Service: Endoscopy;;     reports that he has never smoked. He has never been exposed to tobacco smoke. He has never used smokeless tobacco. He reports that he does not drink alcohol and does not use drugs.  Allergies  Allergen Reactions   Dye Fdc Blue [Brilliant Blue Fcf (Fd&C Blue #1)]     PT UNSURE WHICH DYE   Iodinated Contrast Media Hives    30 YEARS AGO, recent 2021 epidural with contrast ok    Family History  Problem Relation Age of Onset   Lung cancer Father    High blood pressure Father    Colon cancer Neg Hx    Colon polyps Neg Hx     Prior to Admission medications   Medication Sig Start Date End Date Taking? Authorizing Provider  acetaminophen (TYLENOL) 325 MG tablet Take 2 tablets (650 mg total) by mouth every 6 (six) hours as needed for moderate pain. 01/26/22   Wallis Bamberg, PA-C  allopurinol (ZYLOPRIM) 100 MG tablet Take 300 mg by mouth daily.     [provider]  amLODipine (NORVASC) 10 MG tablet TAKE 1 TABLET BY MOUTH EVERYDAY AT BEDTIME 10/09/23   Deeann Saint, MD  atorvastatin (LIPITOR) 10 MG tablet TAKE 1 TABLET BY MOUTH EVERY DAY (NEED PHYSICAL) 10/09/23   Deeann Saint, MD  benzonatate (TESSALON) 100 MG capsule Take 1 capsule (100 mg total) by mouth 2  (two) times daily as needed for cough. 10/03/23   Deeann Saint, MD  calcitRIOL (ROCALTROL) 0.25 MCG capsule Take 0.25 mcg by mouth at bedtime.     [provider]  famotidine (PEPCID) 40 MG tablet TAKE 1 TABLET (40 MG TOTAL) BY MOUTH DAILY. WITH BREAKFAST 12/07/22   Lanelle Bal, DO  fexofenadine [facility] ALLERGY) 60 MG tablet Take 1 tablet (60 mg total) by mouth daily. 10/17/23   Deeann Saint, MD  fluticasone (FLONASE) 50 MCG/ACT nasal spray Place 1 spray into both nostrils daily for 14 days. Patient taking differently: Place 1 spray into both nostrils at bedtime as needed for allergies. 09/29/20 08/08/23  Avegno, Zachery Dakins, FNP  furosemide (LASIX) 40 MG tablet Take 40 mg by mouth daily.    [provider]  gabapentin (NEURONTIN) 100 MG capsule TAKE 1 CAPSULE BY MOUTH THREE TIMES A DAY 07/09/23  Darreld Mclean, MD  hydrALAZINE (APRESOLINE) 25 MG tablet TAKE 1 (ONE) TABLET THREE TIMES DAILY 04/03/23   Deeann Saint, MD  promethazine-dextromethorphan (PROMETHAZINE-DM) 6.25-15 MG/5ML syrup Take 5 mLs by mouth 4 (four) times daily as needed. 12/11/22   Particia Nearing, PA-C  propranolol (INDERAL) 80 MG tablet TAKE 1 TABLET BY MOUTH TWICE A DAY 11/19/23   Deeann Saint, MD  rosuvastatin (CRESTOR) 5 MG tablet Take 5 mg by mouth daily.    [provider]  tamsulosin (FLOMAX) 0.4 MG CAPS capsule Take 0.4 mg by mouth daily. 05/25/20   [provider]  traMADol (ULTRAM) 50 MG tablet Take 1 tablet (50 mg total) by mouth 3 (three) times daily as needed. 11/17/23   Particia Nearing, PA-C  traZODone (DESYREL) 50 MG tablet TAKE 1 TABLET BY MOUTH EVERYDAY AT BEDTIME Patient taking differently: 50 mg. 05/25/22   Jetty Duhamel D, MD   I have personally, briefly reviewed patient's prior medical records in [place] [facility]  Objective: Blood pressure (!) 176/81, pulse 61, temperature 97.9 F (36.6 C), temperature source Oral, resp. rate 13, height 5\' 10"   (1.778 m), weight 86.6 kg, SpO2 100%.   Constitutional: NAD, calm, comfortable HEENT: lids and conjunctivae normal. MMM. Posterior pharynx clear of any exudate or lesions. Normal dentition.  Neck: normal, supple, no masses, no thyromegaly Respiratory: CTAB, no wheezing, no crackles. Normal respiratory effort. No accessory muscle use.  Cardiovascular: RRR, no murmurs / rubs / gallops. No extremity edema. 2+ pedal pulses. no clubbing / cyanosis.  Abdomen: soft, NT, ND, no masses or HSM palpated. No CVA tenderness with percussion Skin: dry, intact, normal color, normal temperature on exposed skin Neurologic: Alert and oriented x 3. Normal speech. Grossly non-focal exam. PERRL Psychiatric: Normal mood. Congruent affect.  Labs on Admission: I have personally reviewed admission labs and imaging studies  CBC    Component Value Date/Time   WBC 8.0 11/19/2023 1334   RBC 3.15 (L) 11/19/2023 1334   HGB 10.3 (L) 11/19/2023 1334   HCT 31.3 (L) 11/19/2023 1334   PLT 170 11/19/2023 1334   MCV 99.4 11/19/2023 1334   MCH 32.7 11/19/2023 1334   MCHC 32.9 11/19/2023 1334   RDW 14.9 11/19/2023 1334   LYMPHSABS 1.2 08/24/2021 0945   MONOABS 0.7 08/24/2021 0945   EOSABS 0.3 08/24/2021 0945   BASOSABS 0.0 08/24/2021 0945   CMP     Component Value Date/Time   NA 139 11/19/2023 1334   NA 141 11/17/2023 1434   K 5.1 11/19/2023 1334   CL 113 (H) 11/19/2023 1334   CO2 18 (L) 11/19/2023 1334   GLUCOSE 112 (H) 11/19/2023 1334   BUN 67 (H) 11/19/2023 1334   BUN 59 (H) 11/17/2023 1434   CREATININE 7.41 (H) 11/19/2023 1334   CREATININE 3.80 (H) 09/02/2020 1424   CALCIUM 9.1 11/19/2023 1334   CALCIUM 10.4 04/09/2018 0000   PROT 7.1 08/24/2021 0945   ALBUMIN 4.3 05/18/2022 0000   AST 13 (A) 10/05/2021 0000   ALT 12 10/05/2021 0000   ALKPHOS 64 08/24/2021 0945   BILITOT 0.5 08/24/2021 0945   GFRNONAA 7 (L) 11/19/2023 1334   GFRNONAA 15 (L) 09/02/2020 1424   GFRAA 17 (L) 09/02/2020 1424     Radiological Exams on Admission: No results found.  EKG: Independently reviewed. Sinus bradycardia.  DVT prophylaxis: heparin injection 5,000 Units Start: 11/19/23 2200   Code Status: full  Family Communication: wife at bedside  Disposition Plan: admit to [facility]  Consults called: EDP contacted nephrology who will see in am   Leeroy Bock, DO [facility]  11/19/2023, 5:29 PM    To contact the appropriate [facility] Attending or Consulting provider: Check [email or web address] for coverage from 7pm-7am

## 2023-11-19 NOTE — ED Triage Notes (Signed)
Pt c/o dizziness starting this morning and abnormal lab (elevated BUN and creatinine) x2 days ago.  Hx of CKD.

## 2023-11-19 NOTE — ED Provider Notes (Signed)
[place] EMERGENCY DEPARTMENT AT [facility] Provider Note   CSN: [id number] Arrival date & time: 11/19/23  1240     History  Chief Complaint  Patient presents with   Dizziness   Abnormal Lab    Add Bruce Weber. is a 72 y.o. male patient with past medical history of GERD, obesity, kidney failure, diabetes, hypertension, reporting to emergency room with reported abnormal labs.  Patient is reporting that he has had increasing potassium he was sent here because his potassium was 5.7, triage note reports elevated BUN and creatinine that has been ongoing for 2 days.  Patient reports that he was originally seen at urgent care and had labs drawn because he was having right flank pain, urgent care thought this was kidney stones, patient thinks he passed the stone as his symptoms resolved.  Patient feels back to baseline although noted some dizziness when he stood up this morning.  Patient reports he has felt like this before upon standing, felt generalized weakness and like the room was spinning.  This resolved upon rest. Denies any focal weakness, chance in sensation, chest pain, shortness of breath or change in vision.    Dizziness Abnormal Lab      Home Medications Prior to Admission medications   Medication Sig Start Date End Date Taking? Authorizing Provider  acetaminophen (TYLENOL) 325 MG tablet Take 2 tablets (650 mg total) by mouth every 6 (six) hours as needed for moderate pain. 01/26/22   Wallis Bamberg, PA-C  allopurinol (ZYLOPRIM) 100 MG tablet Take 300 mg by mouth daily.     [provider]  amLODipine (NORVASC) 10 MG tablet TAKE 1 TABLET BY MOUTH EVERYDAY AT BEDTIME 10/09/23   Deeann Saint, MD  atorvastatin (LIPITOR) 10 MG tablet TAKE 1 TABLET BY MOUTH EVERY DAY (NEED PHYSICAL) 10/09/23   Deeann Saint, MD  benzonatate (TESSALON) 100 MG capsule Take 1 capsule (100 mg total) by mouth 2 (two) times daily as needed for cough. 10/03/23   Deeann Saint, MD   calcitRIOL (ROCALTROL) 0.25 MCG capsule Take 0.25 mcg by mouth at bedtime.     [provider]  famotidine (PEPCID) 40 MG tablet TAKE 1 TABLET (40 MG TOTAL) BY MOUTH DAILY. WITH BREAKFAST 12/07/22   Lanelle Bal, DO  fexofenadine [facility] ALLERGY) 60 MG tablet Take 1 tablet (60 mg total) by mouth daily. 10/17/23   Deeann Saint, MD  fluticasone (FLONASE) 50 MCG/ACT nasal spray Place 1 spray into both nostrils daily for 14 days. Patient taking differently: Place 1 spray into both nostrils at bedtime as needed for allergies. 09/29/20 08/08/23  Avegno, Zachery Dakins, FNP  furosemide (LASIX) 40 MG tablet Take 40 mg by mouth daily.    [provider]  gabapentin (NEURONTIN) 100 MG capsule TAKE 1 CAPSULE BY MOUTH THREE TIMES A DAY 07/09/23   Darreld Mclean, MD  hydrALAZINE (APRESOLINE) 25 MG tablet TAKE 1 (ONE) TABLET THREE TIMES DAILY 04/03/23   Deeann Saint, MD  promethazine-dextromethorphan (PROMETHAZINE-DM) 6.25-15 MG/5ML syrup Take 5 mLs by mouth 4 (four) times daily as needed. 12/11/22   Particia Nearing, PA-C  propranolol (INDERAL) 80 MG tablet TAKE 1 TABLET BY MOUTH TWICE A DAY 11/19/23   Deeann Saint, MD  rosuvastatin (CRESTOR) 5 MG tablet Take 5 mg by mouth daily.    [provider]  tamsulosin (FLOMAX) 0.4 MG CAPS capsule Take 0.4 mg by mouth daily. 05/25/20   [provider]  traMADol Janean Sark)  50 MG tablet Take 1 tablet (50 mg total) by mouth 3 (three) times daily as needed. 11/17/23   Particia Nearing, PA-C  traZODone (DESYREL) 50 MG tablet TAKE 1 TABLET BY MOUTH EVERYDAY AT BEDTIME Patient taking differently: 50 mg. 05/25/22   Waymon Budge, MD      Allergies    Dye fdc blue [brilliant blue fcf (fd&c blue #1)] and Iodinated contrast media    Review of Systems   Review of Systems  Neurological:  Positive for dizziness.    Physical Exam Updated Vital Signs BP (!) 159/77 (BP Location: Right Arm)   Pulse (!) 52   Temp 97.9 F  (36.6 C) (Oral)   Resp 16   Ht 5\' 10"  (1.778 m)   Wt 86.6 kg   SpO2 100%   BMI 27.41 kg/m  Physical Exam Vitals and nursing note reviewed.  Constitutional:      General: He is not in acute distress.    Appearance: He is not toxic-appearing.  HENT:     Head: Normocephalic and atraumatic.  Eyes:     General: No scleral icterus.    Conjunctiva/sclera: Conjunctivae normal.  Cardiovascular:     Rate and Rhythm: Normal rate and regular rhythm.     Pulses: Normal pulses.     Heart sounds: Normal heart sounds.  Pulmonary:     Effort: Pulmonary effort is normal. No respiratory distress.     Breath sounds: Normal breath sounds.  Abdominal:     General: Abdomen is flat. Bowel sounds are normal.     Palpations: Abdomen is soft.     Tenderness: There is no abdominal tenderness.  Musculoskeletal:     Right lower leg: No edema.     Left lower leg: No edema.  Skin:    General: Skin is warm and dry.     Capillary Refill: Capillary refill takes less than 2 seconds.     Findings: No lesion.  Neurological:     General: No focal deficit present.     Mental Status: He is alert and oriented to person, place, and time. Mental status is at baseline.     Cranial Nerves: No cranial nerve deficit.     Sensory: No sensory deficit.     Motor: No weakness.     Coordination: Coordination normal.     Gait: Gait normal.     Comments: No focal call deficits on exam.  Alert oriented answering questions appropriately  Psychiatric:        Mood and Affect: Mood normal.        Behavior: Behavior normal.        Thought Content: Thought content normal.     ED Results / Procedures / Treatments   Labs (all labs ordered are listed, but only abnormal results are displayed) Labs Reviewed  BASIC METABOLIC PANEL - Abnormal; Notable for the following components:      Result Value   Chloride 113 (*)    CO2 18 (*)    Glucose, Bld 112 (*)    BUN 67 (*)    Creatinine, Ser 7.41 (*)    GFR, Estimated 7 (*)     All other components within normal limits  CBC - Abnormal; Notable for the following components:   RBC 3.15 (*)    Hemoglobin 10.3 (*)    HCT 31.3 (*)    All other components within normal limits  URINALYSIS, ROUTINE W REFLEX MICROSCOPIC  TROPONIN I (HIGH SENSITIVITY)  EKG EKG Interpretation Date/Time:  Monday November 19 2023 15:16:29 EST Ventricular Rate:  56 PR Interval:  151 QRS Duration:  92 QT Interval:  454 QTC Calculation: 439 R Axis:   91  Text Interpretation: Sinus rhythm Right axis deviation Minimal ST elevation, inferior leads New since previous tracing Confirmed by Vanetta Mulders [id number]) on 11/19/2023 4:47:57 PM  Radiology No results found.  Procedures Procedures    Medications Ordered in ED Medications  sodium chloride 0.9 % bolus 500 mL (has no administration in time range)    ED Course/ Medical Decision Making/ A&P                                 Medical Decision Making Amount and/or Complexity of Data Reviewed Labs: ordered. Radiology: ordered.  Risk Decision regarding hospitalization.   This patient presents to the ED for concern of abnormal labs, this involves an extensive number of treatment options, and is a complaint that carries with it a high risk of complications and morbidity.  The differential diagnosis includes dehydration, worsening kidney function, orthostatic vitals, vertigo, electrolyte abnormality   Co morbidities that complicate the patient evaluation  GERD, obesity, kidney failure, diabetes, hypertension   Additional history obtained:  Additional history obtained from 11/17/2023 for right flank pain at urgent care    Lab Tests:  I personally interpreted labs.  The pertinent results include:   CBC without leukocytosis, hemoglobin 10.3.  BMP with BUN 67, creatinine 7.4 and GFR 7.  Patient has documented stage IV kidney disease.  In comparison to 2 days ago, patient has worsening kidney function however no prior  recent labs we have on chart.   Imaging Studies ordered:  Renal study - pending    Cardiac Monitoring: / EKG:  The patient was maintained on a cardiac monitor.  I personally viewed and interpreted the cardiac monitored which showed an underlying rhythm of: sinus with minimal ST elevation in inferior leads    Consultations Obtained:  I requested consultation with the nephrology, Dr Arrie Aran and discussed lab and imaging findings as well as pertinent plan - they recommend: Admission to hospitalist team they agreed to come see the patient tomorrow.  They would like admission secondary to AKI  Consult to hospitalist team for admission.    Problem List / ED Course / Critical interventions / Medication management  Patient reporting to emergency room with abnormal labs.  Patient reports he has been feeling at baseline.  Does report that he has some dizziness upon standing, appears to have orthostatic hypotension.  CMP with worsening kidney function since 2 days ago.  Consult nephrology recommend getting CT renal study, fluids and admission. Patients symptoms of right flank pain have resolved. They will come see the patient tomorrow. Patient stable and well appearing.  Ordered NS 500 bolus  I have reviewed the patients home medicines and have made adjustments as needed   Plan  Admit for AKI         Final Clinical Impression(s) / ED Diagnoses Final diagnoses:  AKI (acute kidney injury) (HCC)  Orthostatic hypotension    Rx / DC Orders ED Discharge Orders     None         Smitty Knudsen, PA-C 11/19/23 1707    Gloris Manchester, MD 12/04/23 [phone]

## 2023-11-19 NOTE — ED Provider Triage Note (Signed)
Emergency Medicine Provider Triage Evaluation Note  Bruce Weber. , a 72 y.o. male  was evaluated in triage.  Pt states he was contacted from urgent care and advised to come here for elevated potassium.  He was seen at urgent care on Saturday for right flank pain.  Had blood work done.  States his flank pain spontaneously resolved over the weekend.  States he was told he may have a kidney stone.  He denies any dysuria or hematuria.  Has history of CKD.  He also reports feeling dizzy upon standing today.  Denies any chest pain or shortness of breath no numbness or weakness.  Review of Systems  Positive: Abnormal blood work, dizziness with standing Negative: Chest pain, shortness of breath, fever, chills, dysuria or hematuria  Physical Exam  BP (!) 159/77 (BP Location: Right Arm)   Pulse (!) 52   Temp 97.9 F (36.6 C) (Oral)   Resp 16   Ht 5\' 10"  (1.778 m)   Wt 86.6 kg   SpO2 100%   BMI 27.41 kg/m  Gen:   Awake, no distress   Resp:  Normal effort  MSK:   Moves extremities without difficulty  Other:    Medical Decision Making  Medically screening exam initiated at 2:46 PM.  Appropriate orders placed.  Bertran Jonette Weber. was informed that the remainder of the evaluation will be completed by another provider, this initial triage assessment does not replace that evaluation, and the importance of remaining in the ED until their evaluation is complete.     Pauline Aus, PA-C 11/19/23 1449

## 2023-11-19 NOTE — Plan of Care (Signed)

## 2023-11-19 NOTE — ED Notes (Signed)
ED TO INPATIENT HANDOFF REPORT  ED Nurse Name and Phone #: [phone] Kelton Pillar Name/Age/Gender Bruce Weber. 72 y.o. male Room/Bed: APA14/APA14  Code Status   Code Status: Full Code  Home/SNF/Other Home Patient oriented to: self, place, time, and situation Is this baseline? Yes   Triage Complete: Triage complete  Chief Complaint AKI (acute kidney injury) (HCC) [N17.9]  Triage Note Pt c/o dizziness starting this morning and abnormal lab (elevated BUN and creatinine) x2 days ago.  Hx of CKD.       Allergies Allergies  Allergen Reactions   Dye Fdc Blue [Brilliant Blue Fcf (Fd&C Blue #1)]     PT UNSURE WHICH DYE   Iodinated Contrast Media Hives    30 YEARS AGO, recent 2021 epidural with contrast ok    Level of Care/Admitting Diagnosis ED Disposition     ED Disposition  Admit   Condition  --   Comment  Hospital Area: [facility] [[id number]]  Level of Care: Med-Surg [16]  Covid Evaluation: Asymptomatic - no recent exposure (last 10 days) testing not required  Diagnosis: AKI (acute kidney injury) [facility]) [[id number]]  Admitting Physician: Leeroy Bock [[id number]]  Attending Physician: Leeroy Bock [[id number]]  Certification:: I certify this patient will need inpatient services for at least 2 midnights  Expected Medical Readiness: 11/23/2023          B Medical/Surgery History Past Medical History:  Diagnosis Date   Adenoma 11/03/2009   1.1 cm from TCS   DM (diabetes mellitus) (HCC)    GERD (gastroesophageal reflux disease)    High cholesterol    History of kidney stones    HTN (hypertension)    Kidney failure    stage 4   Kidney stones    Obesity (BMI 30-39.9) DEC 2011 223 LBS   Rectal bleeding 12/04/2009   secondary to hemorrhoids   Past Surgical History:  Procedure Laterality Date   CIRCUMCISION     COLONOSCOPY  Dec 2010 [facility]   MOD IH, SIMPLE ADENOMA 1.1 CM   COLONOSCOPY  11/25/2012   SLF: 1. Sessile polyp measuring 6mm in  size was found at the hepatic flesure; polypectomy was performed using snare cautery 2. Moderate diverticulosis was noted in the ascending colon and sigmoid colon. 3. The colon mucosa was otherwise normal 4. Large internal hemorrhoids.    COLONOSCOPY N/A 03/01/2017   Procedure: COLONOSCOPY;  Surgeon: West Bali, MD;  Location: [facility];  Service: Endoscopy;  Laterality: N/A;  10:30 AM   COLONOSCOPY WITH PROPOFOL N/A 04/14/2022   Procedure: COLONOSCOPY WITH PROPOFOL;  Surgeon: Lanelle Bal, DO;  Location: [facility] END[facility]ITE;  Service: Endoscopy;  Laterality: N/A;  11:00am   EYE SURGERY  05/2018   bilateral cataract surgery   HERNIA REPAIR     POLYPECTOMY  04/14/2022   Procedure: POLYPECTOMY;  Surgeon: Lanelle Bal, DO;  Location: [facility];  Service: Endoscopy;;     A IV Location/Drains/Wounds Patient Lines/Drains/Airways Status     Active Line/Drains/Airways     Name Placement date Placement time Site Days   Peripheral IV 11/05/23 22 G 1" Left Arm 11/05/23  1241  Arm  14   Peripheral IV 11/19/23 22 G Posterior;Right Hand 11/19/23  1623  Hand  less than 1            Intake/Output Last 24 hours No intake or output data in the 24 hours ending 11/19/23 1733  Labs/Imaging Results for orders placed or  performed during the hospital encounter of 11/19/23 (from the past 48 hours)  Basic metabolic panel     Status: Abnormal   Collection Time: 11/19/23  1:34 PM  Result Value Ref Range   Sodium 139 135 - 145 mmol/L   Potassium 5.1 3.5 - 5.1 mmol/L   Chloride 113 (H) 98 - 111 mmol/L   CO2 18 (L) 22 - 32 mmol/L   Glucose, Bld 112 (H) 70 - 99 mg/dL    Comment: Glucose reference range applies only to samples taken after fasting for at least 8 hours.   BUN 67 (H) 8 - 23 mg/dL   Creatinine, Ser 1.61 (H) 0.61 - 1.24 mg/dL   Calcium 9.1 8.9 - 09.6 mg/dL   GFR, Estimated 7 (L) >60 mL/min    Comment: (NOTE) Calculated using the CKD-EPI Creatinine Equation (2021)    Anion gap  8 5 - 15    Comment: Performed at [facility], [address], [place], [place] [address]  CBC     Status: Abnormal   Collection Time: 11/19/23  1:34 PM  Result Value Ref Range   WBC 8.0 4.0 - 10.5 K/uL   RBC 3.15 (L) 4.22 - 5.81 MIL/uL   Hemoglobin 10.3 (L) 13.0 - 17.0 g/dL   HCT 98.1 (L) 19.1 - 47.8 %   MCV 99.4 80.0 - 100.0 fL   MCH 32.7 26.0 - 34.0 pg   MCHC 32.9 30.0 - 36.0 g/dL   RDW 29.5 62.1 - 30.8 %   Platelets 170 150 - 400 K/uL   nRBC 0.0 0.0 - 0.2 %    Comment: Performed at [facility], [address], [place], [place] [address]  Troponin I (High Sensitivity)     Status: None   Collection Time: 11/19/23  1:34 PM  Result Value Ref Range   Troponin I (High Sensitivity) 11 <18 ng/L    Comment: (NOTE) Elevated high sensitivity troponin I (hsTnI) values and significant  changes across serial measurements may suggest ACS but many other  chronic and acute conditions are known to elevate hsTnI results.  Refer to the "Links" section for chest pain algorithms and additional  guidance. Performed at [facility], [address] [place], [place] [address]    No results found.  Pending Labs Unresulted Labs (From admission, onward)     Start     Ordered   11/20/23 0500  Basic metabolic panel  Tomorrow morning,   R        11/19/23 1726   11/20/23 0500  CBC  Tomorrow morning,   R        11/19/23 1726   11/19/23 1726  CBC  (heparin)  Once,   R       Comments: Baseline for heparin therapy IF NOT ALREADY DRAWN.  Notify MD if PLT < 100 K.    11/19/23 1726   11/19/23 1726  Creatinine, serum  (heparin)  Once,   R       Comments: Baseline for heparin therapy IF NOT ALREADY DRAWN.    11/19/23 1726   11/19/23 1314  Urinalysis, Routine w reflex microscopic -Urine, Clean Catch  Once,   URGENT       Question:  Specimen Source  Answer:  Urine, Clean Catch   11/19/23 1313            Vitals/Pain Today's Vitals   11/19/23 1312 11/19/23 1600 11/19/23 1630 11/19/23 1700  BP: (!)  159/77 (!) 175/76 (!) 176/81   Pulse: Marland Kitchen)  52 (!) 54 (!) 55 61  Resp: 16 12 12 13   Temp: 97.9 F (36.6 C)     TempSrc: Oral     SpO2: 100% 99% 99% 100%  Weight: 191 lb (86.6 kg)     Height: 5\' 10"  (1.778 m)     PainSc: 0-No pain       Isolation Precautions No active isolations  Medications Medications  amLODipine (NORVASC) tablet 10 mg (has no administration in time range)  hydrALAZINE (APRESOLINE) tablet 25 mg (has no administration in time range)  propranolol (INDERAL) tablet 80 mg (has no administration in time range)  traZODone (DESYREL) tablet 50 mg (has no administration in time range)  calcitRIOL (ROCALTROL) capsule 0.25 mcg (has no administration in time range)  tamsulosin (FLOMAX) capsule 0.4 mg (has no administration in time range)  gabapentin (NEURONTIN) capsule 100 mg (has no administration in time range)  acetaminophen (TYLENOL) tablet 650 mg (has no administration in time range)    Or  acetaminophen (TYLENOL) suppository 650 mg (has no administration in time range)  polyethylene glycol (MIRALAX / GLYCOLAX) packet 17 g (has no administration in time range)  heparin injection 5,000 Units (has no administration in time range)  sodium chloride 0.9 % bolus 500 mL (500 mLs Intravenous New Bag/Given 11/19/23 1623)    Mobility walks     Focused Assessments    R Recommendations: See Admitting Provider Note  Report given to:   Additional Notes:

## 2023-11-20 DIAGNOSIS — N185 Chronic kidney disease, stage 5: Secondary | ICD-10-CM | POA: Diagnosis not present

## 2023-11-20 DIAGNOSIS — N200 Calculus of kidney: Secondary | ICD-10-CM

## 2023-11-20 DIAGNOSIS — N2889 Other specified disorders of kidney and ureter: Secondary | ICD-10-CM

## 2023-11-20 DIAGNOSIS — I951 Orthostatic hypotension: Secondary | ICD-10-CM | POA: Diagnosis not present

## 2023-11-20 DIAGNOSIS — N179 Acute kidney failure, unspecified: Secondary | ICD-10-CM | POA: Diagnosis not present

## 2023-11-20 LAB — CBC
HCT: 31 % — ABNORMAL LOW (ref 39.0–52.0)
Hemoglobin: 9.8 g/dL — ABNORMAL LOW (ref 13.0–17.0)
MCH: 31.5 pg (ref 26.0–34.0)
MCHC: 31.6 g/dL (ref 30.0–36.0)
MCV: 99.7 fL (ref 80.0–100.0)
Platelets: 150 10*3/uL (ref 150–400)
RBC: 3.11 MIL/uL — ABNORMAL LOW (ref 4.22–5.81)
RDW: 14.6 % (ref 11.5–15.5)
WBC: 8.8 10*3/uL (ref 4.0–10.5)
nRBC: 0 % (ref 0.0–0.2)

## 2023-11-20 LAB — BASIC METABOLIC PANEL
Anion gap: 13 (ref 5–15)
BUN: 72 mg/dL — ABNORMAL HIGH (ref 8–23)
CO2: 16 mmol/L — ABNORMAL LOW (ref 22–32)
Calcium: 9.1 mg/dL (ref 8.9–10.3)
Chloride: 112 mmol/L — ABNORMAL HIGH (ref 98–111)
Creatinine, Ser: 7.34 mg/dL — ABNORMAL HIGH (ref 0.61–1.24)
GFR, Estimated: 7 mL/min — ABNORMAL LOW (ref >60)
Glucose, Bld: 83 mg/dL (ref 70–99)
Potassium: 5 mmol/L (ref 3.5–5.1)
Sodium: 141 mmol/L (ref 135–145)

## 2023-11-20 LAB — GLUCOSE, CAPILLARY: Glucose-Capillary: 108 mg/dL — ABNORMAL HIGH (ref 70–99)

## 2023-11-20 MED ORDER — ALUM & MAG HYDROXIDE-SIMETH 200-200-20 MG/5ML PO SUSP
15.0000 mL | Freq: Four times a day (QID) | ORAL | Status: DC | PRN
  Administered 2023-11-20: 15 mL via ORAL
  Filled 2023-11-20: qty 30

## 2023-11-20 MED ORDER — SODIUM BICARBONATE 650 MG PO TABS
650.0000 mg | ORAL_TABLET | Freq: Three times a day (TID) | ORAL | Status: DC
  Administered 2023-11-20 – 2023-11-23 (×10): 650 mg via ORAL
  Filled 2023-11-20 (×10): qty 1

## 2023-11-20 NOTE — Consult Note (Signed)
Urology Consult  Referring physician: Dr. Laural Benes Reason for referral: ureteral calculus, renal masses  Chief Complaint: right flank pain  History of Present Illness: Bruce Weber is a 72yo with a history of HTN, CKD4 and nephrolithiasis who was admitted for worsening creatinine and right flank pain related to a right ureteral calculus. He has had 4 stone events in the past and is followed by Dr. Mena Goes at [facility]. Starting 3 days ago he developed sharp, intermittent, mild to moderate right flank pain. He presented to urgent care and during the course of his visit his pain resolved. BMP was drawn and after patient was discharged he was told to go to the ER for worsening renal function. Currently he denies any flank pain. Creatinine 7.4 from a baseline of 5-5.8. He continues to make urine and has not seen the stone pass. CT obtained in the ER showed a 3x62mm right proximal ureteral calculus with mild to moderate right hydronephrosis. He had numerous bilateral renal cysts as well as lesions with a density greater that simple fluid which is concerning for possible malignancy. No other complaints today  Past Medical History:  Diagnosis Date   Adenoma 11/03/2009   1.1 cm from TCS   DM (diabetes mellitus) (HCC)    GERD (gastroesophageal reflux disease)    High cholesterol    History of kidney stones    HTN (hypertension)    Kidney failure    stage 4   Kidney stones    Obesity (BMI 30-39.9) DEC 2011 223 LBS   Rectal bleeding 12/04/2009   secondary to hemorrhoids   Past Surgical History:  Procedure Laterality Date   CIRCUMCISION     COLONOSCOPY  Dec 2010 [facility]   MOD IH, SIMPLE ADENOMA 1.1 CM   COLONOSCOPY  11/25/2012   SLF: 1. Sessile polyp measuring 6mm in size was found at the hepatic flesure; polypectomy was performed using snare cautery 2. Moderate diverticulosis was noted in the ascending colon and sigmoid colon. 3. The colon mucosa was otherwise normal 4. Large internal  hemorrhoids.    COLONOSCOPY N/A 03/01/2017   Procedure: COLONOSCOPY;  Surgeon: West Bali, MD;  [facility];  Service: Endoscopy;  Laterality: N/A;  10:30 AM   COLONOSCOPY WITH PROPOFOL N/A 04/14/2022   Procedure: COLONOSCOPY WITH PROPOFOL;  Surgeon: Lanelle Bal, DO;  Location: [facility];  Service: Endoscopy;  Laterality: N/A;  11:00am   EYE SURGERY  05/2018   bilateral cataract surgery   HERNIA REPAIR     POLYPECTOMY  04/14/2022   Procedure: POLYPECTOMY;  Surgeon: Lanelle Bal, DO;  Location: [facility];  Service: Endoscopy;;    Medications: I have reviewed the patient's current medications. Allergies:  Allergies  Allergen Reactions   Dye Fdc Blue [Brilliant Blue Fcf (Fd&C Blue #1)]     PT UNSURE WHICH DYE   Iodinated Contrast Media Hives    30 YEARS AGO, recent 2021 epidural with contrast ok    Family History  Problem Relation Age of Onset   Lung cancer Father    High blood pressure Father    Colon cancer Neg Hx    Colon polyps Neg Hx    Social History:  reports that he has never smoked. He has never been exposed to tobacco smoke. He has never used smokeless tobacco. He reports that he does not drink alcohol and does not use drugs.  Review of Systems  Genitourinary:  Positive for flank pain.  All other systems reviewed  and are negative.   Physical Exam:  Vital signs in last 24 hours: Temp:  [97.5 F (36.4 C)-98.9 F (37.2 C)] 98.6 F (37 C) (12/17 2105) Pulse Rate:  [60-66] 62 (12/17 2105) Resp:  [16-20] 20 (12/17 2105) BP: (153-171)/(69-82) 171/79 (12/17 2105) SpO2:  [99 %-100 %] 100 % (12/17 2105) Physical Exam Vitals reviewed.  Constitutional:      Appearance: Normal appearance.  HENT:     Head: Normocephalic and atraumatic.     Nose: Nose normal.     Mouth/Throat:     Mouth: Mucous membranes are dry.  Eyes:     Extraocular Movements: Extraocular movements intact.     Pupils: Pupils are equal, round, and reactive to light.   Cardiovascular:     Rate and Rhythm: Normal rate and regular rhythm.  Pulmonary:     Effort: Pulmonary effort is normal. No respiratory distress.  Abdominal:     General: Abdomen is flat. There is no distension.  Musculoskeletal:        General: No swelling. Normal range of motion.     Cervical back: Normal range of motion and neck supple.  Skin:    General: Skin is warm and dry.  Neurological:     General: No focal deficit present.     Mental Status: He is alert and oriented to person, place, and time.  Psychiatric:        Mood and Affect: Mood normal.        Behavior: Behavior normal.        Thought Content: Thought content normal.        Judgment: Judgment normal.     Laboratory Data:  Results for orders placed or performed during the hospital encounter of 11/19/23 (from the past 72 hours)  Basic metabolic panel     Status: Abnormal   Collection Time: 11/19/23  1:34 PM  Result Value Ref Range   Sodium 139 135 - 145 mmol/L   Potassium 5.1 3.5 - 5.1 mmol/L   Chloride 113 (H) 98 - 111 mmol/L   CO2 18 (L) 22 - 32 mmol/L   Glucose, Bld 112 (H) 70 - 99 mg/dL    Comment: Glucose reference range applies only to samples taken after fasting for at least 8 hours.   BUN 67 (H) 8 - 23 mg/dL   Creatinine, Ser 1.61 (H) 0.61 - 1.24 mg/dL   Calcium 9.1 8.9 - 09.6 mg/dL   GFR, Estimated 7 (L) >60 mL/min    Comment: (NOTE) Calculated using the CKD-EPI Creatinine Equation (2021)    Anion gap 8 5 - 15    Comment: Performed at [facility], [address] [place], [place] [address]  CBC     Status: Abnormal   Collection Time: 11/19/23  1:34 PM  Result Value Ref Range   WBC 8.0 4.0 - 10.5 K/uL   RBC 3.15 (L) 4.22 - 5.81 MIL/uL   Hemoglobin 10.3 (L) 13.0 - 17.0 g/dL   HCT 98.1 (L) 19.1 - 47.8 %   MCV 99.4 80.0 - 100.0 fL   MCH 32.7 26.0 - 34.0 pg   MCHC 32.9 30.0 - 36.0 g/dL   RDW 29.5 62.1 - 30.8 %   Platelets 170 150 - 400 K/uL   nRBC 0.0 0.0 - 0.2 %    Comment: Performed at  [facility] [address] [place], [place] [address]  Troponin I (High Sensitivity)     Status: None   Collection Time: 11/19/23  1:34  PM  Result Value Ref Range   Troponin I (High Sensitivity) 11 <18 ng/L    Comment: (NOTE) Elevated high sensitivity troponin I (hsTnI) values and significant  changes across serial measurements may suggest ACS but many other  chronic and acute conditions are known to elevate hsTnI results.  Refer to the "Links" section for chest pain algorithms and additional  guidance. Performed at [facility], [address], [place], [place] [address]   Troponin I (High Sensitivity)     Status: None   Collection Time: 11/19/23  5:34 PM  Result Value Ref Range   Troponin I (High Sensitivity) 10 <18 ng/L    Comment: (NOTE) Elevated high sensitivity troponin I (hsTnI) values and significant  changes across serial measurements may suggest ACS but many other  chronic and acute conditions are known to elevate hsTnI results.  Refer to the "Links" section for chest pain algorithms and additional  guidance. Performed at [facility], [address], [place], [place] [address]   Urinalysis, Routine w reflex microscopic -Urine, Clean Catch     Status: Abnormal   Collection Time: 11/19/23  6:05 PM  Result Value Ref Range   Color, Urine STRAW (A) YELLOW   APPearance CLEAR CLEAR   Specific Gravity, Urine 1.008 1.005 - 1.030   pH 7.0 5.0 - 8.0   Glucose, UA NEGATIVE NEGATIVE mg/dL   Hgb urine dipstick SMALL (A) NEGATIVE   Bilirubin Urine NEGATIVE NEGATIVE   Ketones, ur NEGATIVE NEGATIVE mg/dL   Protein, ur 295 (A) NEGATIVE mg/dL   Nitrite NEGATIVE NEGATIVE   Leukocytes,Ua NEGATIVE NEGATIVE   RBC / HPF 6-10 0 - 5 RBC/hpf   WBC, UA 6-10 0 - 5 WBC/hpf   Bacteria, UA NONE SEEN NONE SEEN   Squamous Epithelial / HPF 0-5 0 - 5 /HPF    Comment: Performed at [facility], [address] [place], [place] [address]  Basic metabolic panel     Status: Abnormal   Collection Time:  11/20/23  4:15 AM  Result Value Ref Range   Sodium 141 135 - 145 mmol/L   Potassium 5.0 3.5 - 5.1 mmol/L   Chloride 112 (H) 98 - 111 mmol/L   CO2 16 (L) 22 - 32 mmol/L   Glucose, Bld 83 70 - 99 mg/dL    Comment: Glucose reference range applies only to samples taken after fasting for at least 8 hours.   BUN 72 (H) 8 - 23 mg/dL   Creatinine, Ser 2.44 (H) 0.61 - 1.24 mg/dL   Calcium 9.1 8.9 - 01.0 mg/dL   GFR, Estimated 7 (L) >60 mL/min    Comment: (NOTE) Calculated using the CKD-EPI Creatinine Equation (2021)    Anion gap 13 5 - 15    Comment: Performed at [facility], [address] [place], [place] [address]  CBC     Status: Abnormal   Collection Time: 11/20/23  4:15 AM  Result Value Ref Range   WBC 8.8 4.0 - 10.5 K/uL   RBC 3.11 (L) 4.22 - 5.81 MIL/uL   Hemoglobin 9.8 (L) 13.0 - 17.0 g/dL   HCT 66.4 (L) 40.3 - 47.4 %   MCV 99.7 80.0 - 100.0 fL   MCH 31.5 26.0 - 34.0 pg   MCHC 31.6 30.0 - 36.0 g/dL   RDW 25.9 56.3 - 87.5 %   Platelets 150 150 - 400 K/uL   nRBC 0.0 0.0 - 0.2 %    Comment: Performed at [facility], [address],  [place], [place] [address]  Glucose, capillary     Status: Abnormal   Collection Time: 11/20/23  9:08 PM  Result Value Ref Range   Glucose-Capillary 108 (H) 70 - 99 mg/dL    Comment: Glucose reference range applies only to samples taken after fasting for at least 8 hours.   Comment 1 Notify RN    Comment 2 Document in Chart    No results found for this or any previous visit (from the past 240 hours). Creatinine: Recent Labs    11/17/23 1434 11/19/23 1334 11/20/23 0415  CREATININE 6.85* 7.41* 7.34*   Baseline Creatinine: 5-5.8  Impression/Assessment:  72yo with right ureteral calculus, renal masses  Plan:  Right ureteral calculus:-We discussed the management of kidney stones. These options include observation, ureteroscopy, shockwave lithotripsy (ESWL) and percutaneous nephrolithotomy (PCNL). We discussed which options are relevant to the  patient's stone(s). We discussed the natural history of kidney stones as well as the complications of untreated stones and the impact on quality of life without treatment as well as with each of the above listed treatments. We also discussed the efficacy of each treatment in its ability to clear the stone burden. With any of these management options I discussed the signs and symptoms of infection and the need for emergent treatment should these be experienced. For each option we discussed the ability of each procedure to clear the patient of their stone burden.   For observation I described the risks which include but are not limited to silent renal damage, life-threatening infection, need for emergent surgery, failure to pass stone and pain.   For ureteroscopy I described the risks which include bleeding, infection, damage to contiguous structures, positioning injury, ureteral stricture, ureteral avulsion, ureteral injury, need for prolonged ureteral stent, inability to perform ureteroscopy, need for an interval procedure, inability to clear stone burden, stent discomfort/pain, heart attack, stroke, pulmonary embolus and the inherent risks with general anesthesia.   For shockwave lithotripsy I described the risks which include arrhythmia, kidney contusion, kidney hemorrhage, need for transfusion, pain, inability to adequately break up stone, inability to pass stone fragments, Steinstrasse, infection associated with obstructing stones, need for alternate surgical procedure, need for repeat shockwave lithotripsy, MI, CVA, PE and the inherent risks with anesthesia/conscious sedation.   For PCNL I described the risks including positioning injury, pneumothorax, hydrothorax, need for chest tube, inability to clear stone burden, renal laceration, arterial venous fistula or malformation, need for embolization of kidney, loss of kidney or renal function, need for repeat procedure, need for prolonged nephrostomy  tube, ureteral avulsion, MI, CVA, PE and the inherent risks of general anesthesia.   - The patient would like to proceed with medical expulsive therapy. We will continue flomax 0.8mg  daily and patient will strain his urine.  2. Renal masses: We discussed the natural hx of renal masses and workup of renal masses. .We will schedule MRI as an outpatient to further characterize the bilateral renal masses.  Wilkie Aye 11/20/2023, 9:19 PM

## 2023-11-20 NOTE — Consult Note (Signed)
Reason for Consult: AKI/CKD stage IV-V Referring Physician: Laural Benes, MD  Bruce Jonette Mate. is an 72 y.o. male has a PMH significant for DM type 2, HTN, HLD, h/o nephrolithiasis, GERD, spinal stenosis, and CKD stage IV-V who presented to [facility] ED on 11/19/23 c/o dizziness.  He presented to Urgent care on 11/17/23 c/o gross hematuria and flank pain.  His Scr was noted to be elevated at that time but was discharged home.  When he presented again on 12/16, Temp 97.9, Bp 175/76, HR 54, SpO2 100%.  Labs notable for K 5.1, Cl 113, Co2 18, BUN 67, Cr 7.41, Hgb 10.3, WBC 8, gluc 112.  CT scan stone protocol revealed stone in mid right ureter with mild right hydroureteronephrosis and 6 mm nonobstructing stone interpolar right kidney.  Also noted were multiple lesions of bilateral kidneys with interval growth from the last imaging and could be cysts vs neoplasm.  He was admitted and we were consulted to further evaluate his AKI/CKD stage IV-V.  The trend in Scr is seen below.  He is well known to me from outpatient follow up and his Scr has been elevated but stable for the past 2 years.  He is being evaluated for kidney transplantation and is leaning towards PD, however he had declined PD catheter placement since he had been doing well and his Scr had been stable.  He denies any N/V/D, dysgeusia, anorexia, or malaise.  He reports his flank pain has resolved as well as the gross hematuria.  Trend in Creatinine:   Creatinine, Ser  Date/Time Value Ref Range Status  11/20/2023 04:15 AM 7.34 (H) 0.61 - 1.24 mg/dL Final  21/30/8657 84:69 PM 7.41 (H) 0.61 - 1.24 mg/dL Final  62/95/2841 32:44 PM 6.85 (H) 0.76 - 1.27 mg/dL Final  12/06/7251 66:44 AM 4.95 (H)    04/12/2022 10:25 AM 5.79 (H) 0.61 - 1.24 mg/dL Final  03/47/4259 56:38 AM 3.69 (H) 0.40 - 1.50 mg/dL Final  75/64/3329 51:88 AM 3.08 (H) 0.40 - 1.50 mg/dL Final  41/66/0630 16:01 PM 2.96 (H) 0.40 - 1.50 mg/dL Final   Creatinine  Date/Time Value Ref Range  Status  05/18/2022 12:00 AM 5.0 (A) 0.6 - 1.3 Final  10/05/2021 12:00 AM 4.3 (A) 0.6 - 1.3 Final  04/08/2021 12:00 AM 3.8 (A) 0.6 - 1.3 Final  04/09/2018 12:00 AM 3.0 (A) 0.6 - 1.3 Final   Creat  Date/Time Value Ref Range Status  09/02/2020 02:24 PM 3.80 (H) 0.70 - 1.18 mg/dL Final    PMH:   Past Medical History:  Diagnosis Date   Adenoma 11/03/2009   1.1 cm from TCS   DM (diabetes mellitus) (HCC)    GERD (gastroesophageal reflux disease)    High cholesterol    History of kidney stones    HTN (hypertension)    Kidney failure    stage 4   Kidney stones    Obesity (BMI 30-39.9) DEC 2011 223 LBS   Rectal bleeding 12/04/2009   secondary to hemorrhoids    PSH:   Past Surgical History:  Procedure Laterality Date   CIRCUMCISION     COLONOSCOPY  Dec 2010 [facility]   MOD IH, SIMPLE ADENOMA 1.1 CM   COLONOSCOPY  11/25/2012   SLF: 1. Sessile polyp measuring 6mm in size was found at the hepatic flesure; polypectomy was performed using snare cautery 2. Moderate diverticulosis was noted in the ascending colon and sigmoid colon. 3. The colon mucosa was otherwise normal 4. Large internal hemorrhoids.  COLONOSCOPY N/A 03/01/2017   Procedure: COLONOSCOPY;  Surgeon: West Bali, MD;  Location: [facility];  Service: Endoscopy;  Laterality: N/A;  10:30 AM   COLONOSCOPY WITH PROPOFOL N/A 04/14/2022   Procedure: COLONOSCOPY WITH PROPOFOL;  Surgeon: Lanelle Bal, DO;  Location: [facility];  Service: Endoscopy;  Laterality: N/A;  11:00am   EYE SURGERY  05/2018   bilateral cataract surgery   HERNIA REPAIR     POLYPECTOMY  04/14/2022   Procedure: POLYPECTOMY;  Surgeon: Lanelle Bal, DO;  Location: [facility];  Service: Endoscopy;;    Allergies:  Allergies  Allergen Reactions   Dye Fdc Blue [Brilliant Blue Fcf (Fd&C Blue #1)]     PT UNSURE WHICH DYE   Iodinated Contrast Media Hives    30 YEARS AGO, recent 2021 epidural with contrast ok    Medications:   Prior to  Admission medications   Medication Sig Start Date End Date Taking? Authorizing Provider  acetaminophen (TYLENOL) 325 MG tablet Take 2 tablets (650 mg total) by mouth every 6 (six) hours as needed for moderate pain. 01/26/22   Wallis Bamberg, PA-C  allopurinol (ZYLOPRIM) 100 MG tablet Take 300 mg by mouth daily.     [provider]  amLODipine (NORVASC) 10 MG tablet TAKE 1 TABLET BY MOUTH EVERYDAY AT BEDTIME 10/09/23   Deeann Saint, MD  atorvastatin (LIPITOR) 10 MG tablet TAKE 1 TABLET BY MOUTH EVERY DAY (NEED PHYSICAL) 10/09/23   Deeann Saint, MD  benzonatate (TESSALON) 100 MG capsule Take 1 capsule (100 mg total) by mouth 2 (two) times daily as needed for cough. 10/03/23   Deeann Saint, MD  calcitRIOL (ROCALTROL) 0.25 MCG capsule Take 0.25 mcg by mouth at bedtime.     [provider]  famotidine (PEPCID) 40 MG tablet TAKE 1 TABLET (40 MG TOTAL) BY MOUTH DAILY. WITH BREAKFAST 12/07/22   Lanelle Bal, DO  fexofenadine [facility] ALLERGY) 60 MG tablet Take 1 tablet (60 mg total) by mouth daily. 10/17/23   Deeann Saint, MD  fluticasone (FLONASE) 50 MCG/ACT nasal spray Place 1 spray into both nostrils daily for 14 days. Patient taking differently: Place 1 spray into both nostrils at bedtime as needed for allergies. 09/29/20 08/08/23  Avegno, Zachery Dakins, FNP  furosemide (LASIX) 40 MG tablet Take 40 mg by mouth daily.    [provider]  gabapentin (NEURONTIN) 100 MG capsule TAKE 1 CAPSULE BY MOUTH THREE TIMES A DAY 07/09/23   Darreld Mclean, MD  hydrALAZINE (APRESOLINE) 25 MG tablet TAKE 1 (ONE) TABLET THREE TIMES DAILY 04/03/23   Deeann Saint, MD  promethazine-dextromethorphan (PROMETHAZINE-DM) 6.25-15 MG/5ML syrup Take 5 mLs by mouth 4 (four) times daily as needed. 12/11/22   Particia Nearing, PA-C  propranolol (INDERAL) 80 MG tablet TAKE 1 TABLET BY MOUTH TWICE A DAY 11/19/23   Deeann Saint, MD  rosuvastatin (CRESTOR) 5 MG tablet Take 5 mg by mouth daily.     [provider]  tamsulosin (FLOMAX) 0.4 MG CAPS capsule Take 0.4 mg by mouth daily. 05/25/20   [provider]  traMADol (ULTRAM) 50 MG tablet Take 1 tablet (50 mg total) by mouth 3 (three) times daily as needed. 11/17/23   Particia Nearing, PA-C  traZODone (DESYREL) 50 MG tablet TAKE 1 TABLET BY MOUTH EVERYDAY AT BEDTIME Patient taking differently: 50 mg. 05/25/22   Waymon Budge, MD    Inpatient medications:  amLODipine  10 mg Oral Daily  calcitRIOL  0.25 mcg Oral QHS   heparin  5,000 Units Subcutaneous Q8H   hydrALAZINE  25 mg Oral Q8H   polyethylene glycol  17 g Oral Daily   propranolol  80 mg Oral BID   tamsulosin  0.8 mg Oral Daily   traZODone  50 mg Oral QHS    Discontinued Meds:   Medications Discontinued During This Encounter  Medication Reason   tamsulosin (FLOMAX) capsule 0.4 mg     Social History:  reports that he has never smoked. He has never been exposed to tobacco smoke. He has never used smokeless tobacco. He reports that he does not drink alcohol and does not use drugs.  Family History:   Family History  Problem Relation Age of Onset   Lung cancer Father    High blood pressure Father    Colon cancer Neg Hx    Colon polyps Neg Hx     Pertinent items are noted in HPI. Weight change:   Intake/Output Summary (Last 24 hours) at 11/20/2023 0923 Last data filed at 11/19/2023 2100 Gross per 24 hour  Intake 620 ml  Output --  Net 620 ml   BP (!) 160/82 (BP Location: Right Arm)   Pulse 66   Temp (!) 97.5 F (36.4 C) (Oral)   Resp 16   Ht 5\' 10"  (1.778 m)   Wt 87.1 kg   SpO2 99%   BMI 27.55 kg/m  Vitals:   11/19/23 1730 11/19/23 1808 11/19/23 2033 11/20/23 0350  BP: (!) 172/80 (!) 177/79 (!) 160/77 (!) 160/82  Pulse: (!) 59 (!) 52 63 66  Resp: 11 14 18 16   Temp:  (!) 97.4 F (36.3 C) 98 F (36.7 C) (!) 97.5 F (36.4 C)  TempSrc:  Oral  Oral  SpO2: 98% 100% 99% 99%  Weight:  87.1 kg    Height:  5\' 10"  (1.778 m)        General appearance: alert, cooperative, and no distress Head: Normocephalic, without obvious abnormality, atraumatic Resp: clear to auscultation bilaterally Cardio: regular rate and rhythm, S1, S2 normal, no murmur, click, rub or gallop GI: soft, non-tender; bowel sounds normal; no masses,  no organomegaly Extremities: extremities normal, atraumatic, no cyanosis or edema  Labs: Basic Metabolic Panel: Recent Labs  Lab 11/17/23 1434 11/19/23 1334 11/20/23 0415  NA 141 139 141  K 5.6* 5.1 5.0  CL 109* 113* 112*  CO2 15* 18* 16*  GLUCOSE 106* 112* 83  BUN 59* 67* 72*  CREATININE 6.85* 7.41* 7.34*  CALCIUM 9.3 9.1 9.1   Liver Function Tests: No results for input(s): "AST", "ALT", "ALKPHOS", "BILITOT", "PROT", "ALBUMIN" in the last 168 hours. No results for input(s): "LIPASE", "AMYLASE" in the last 168 hours. No results for input(s): "AMMONIA" in the last 168 hours. CBC: Recent Labs  Lab 11/19/23 1334 11/20/23 0415  WBC 8.0 8.8  HGB 10.3* 9.8*  HCT 31.3* 31.0*  MCV 99.4 99.7  PLT 170 150   PT/INR: @LABRCNTIP (inr:5) Cardiac Enzymes: )No results for input(s): "CKTOTAL", "CKMB", "CKMBINDEX", "TROPONINI" in the last 168 hours. CBG: No results for input(s): "GLUCAP" in the last 168 hours.  Iron Studies: No results for input(s): "IRON", "TIBC", "TRANSFERRIN", "FERRITIN" in the last 168 hours.  Xrays/Other Studies: CT Renal Stone Study Result Date: 11/19/2023 CLINICAL DATA:  Abdominal pain.  Kidney stones suspected. EXAM: CT ABDOMEN AND PELVIS WITHOUT CONTRAST TECHNIQUE: Multidetector CT imaging of the abdomen and pelvis was performed following the standard protocol without IV contrast. RADIATION DOSE  REDUCTION: This exam was performed according to the departmental dose-optimization program which includes automated exposure control, adjustment of the mA and/or kV according to patient size and/or use of iterative reconstruction technique. COMPARISON:  07/21/2022 FINDINGS: Lower  chest: Subsegmental atelectasis or linear scarring noted right lower lobe, similar to prior Hepatobiliary: No suspicious focal abnormality in the liver on this study without intravenous contrast. Gallbladder is distended. No intrahepatic or extrahepatic biliary dilation. Pancreas: No focal mass lesion. No dilatation of the main duct. No intraparenchymal cyst. No peripancreatic edema. Spleen: No splenomegaly. No suspicious focal mass lesion. Adrenals/Urinary Tract: No adrenal nodule or mass. 6 mm nonobstructing stone identified interpolar right kidney. Mild right hydroureteronephrosis secondary to the presence of a 2 x 3 x 6 mm stone in the mid right ureter (axial 60/2 and coronal 53/5). No stone in the distal right ureter. No definite stones in the left kidney or ureter. No left hydroureteronephrosis. No bladder stones. Similar appearance bladder wall irregularity along the anterior dome, potentially related to a nondistended diverticulum (78/2). Neoplasm is considered less likely but not entirely excluded. Multiple lesions identified in both kidneys of varying size and attenuation. Dominant lesion is a 5.8 for cm homogeneous lesion exophytic from the upper pole left kidney approaching water density, compatible with a cyst. There are multiple small hyperattenuating lesions in both kidneys measuring 15 mm in the upper interpolar right kidney and up to 2.4 cm in the posterior interpolar left kidney. While these are probably cysts complicated by proteinaceous debris or hemorrhage, there are progressive in the interval a neoplasm could have this appearance. Stomach/Bowel: Tiny hiatal hernia. Stomach otherwise unremarkable. Duodenum is normally positioned as is the ligament of Treitz. No small bowel wall thickening. No small bowel dilatation. The terminal ileum is normal. The appendix is normal. No gross colonic mass. No colonic wall thickening. Diverticuli are seen scattered along the entire length of the colon without  CT findings of diverticulitis. Vascular/Lymphatic: There is mild atherosclerotic calcification of the abdominal aorta without aneurysm. There is no gastrohepatic or hepatoduodenal ligament lymphadenopathy. No retroperitoneal or mesenteric lymphadenopathy. No pelvic sidewall lymphadenopathy. Reproductive: The prostate gland and seminal vesicles are unremarkable. Other: No intraperitoneal free fluid. Musculoskeletal: No worrisome lytic or sclerotic osseous abnormality. IMPRESSION: 1. 2 x 3 x 6 mm stone in the mid right ureter with mild right hydroureteronephrosis. 2. 6 mm nonobstructing stone interpolar right kidney. 3. Multiple lesions in both kidneys of varying size and attenuation. Dominant lesion is a 5.8 cm homogeneous lesion exophytic from the upper pole left kidney in approaching water density, compatible with a cyst. There are multiple small hyperattenuating lesions in both kidneys measuring 15 mm in the upper interpolar right kidney and up to 2.4 cm in the posterior interpolar left kidney. While these are probably cysts complicated by proteinaceous debris or hemorrhage, they are progressive in the interval and a neoplasm could have this appearance. Follow-up outpatient nonemergent renal protocol MRI with and without contrast recommended to further evaluate. 4. Similar appearance bladder wall irregularity along the anterior dome, potentially related to a nondistended diverticulum. Neoplasm is considered less likely but not entirely excluded. Urology consultation recommended. 5. Tiny hiatal hernia. 6.  Aortic Atherosclerosis (ICD10-I70.0). Electronically Signed   By: Kennith Center M.D.   On: 11/19/2023 18:39     Assessment/Plan:  AKI/CKD stage IV-V - possibly due to right hydroureteronephrosis.  Thankfully he is without uremic symptoms.  He is interested in PD and will need to be seen by [facility] for PD  catheter placement sooner rather than later.  No urgent indication to start RRT at this time.  Will continue  to follow closely.  He may need to be transferred to [facility] for PD catheter placement if his renal function were to worsen.   Avoid nephrotoxic medications including NSAIDs and iodinated intravenous contrast exposure unless the latter is absolutely indicated.   Preferred narcotic agents for pain control are hydromorphone, fentanyl, and methadone. Morphine should not be used.  Avoid Baclofen and avoid oral sodium phosphate and magnesium citrate based laxatives / bowel preps.  Continue strict Input and Output monitoring. Will monitor the patient closely with you and intervene or adjust therapy as indicated by changes in clinical status/labs  Nephrolithiasis - presented with gross hematuria and right flank pain.  Evidence of mid right ureter stone with mild right hydroureteronephrosis.  Agree with urology consultation. Enlarging lesions of both kidneys - he was evaluated at 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  Also noted to have proteinaceous/hemorrhagic renal cysts by MRI on 05/10/23.  May need to repeat MRI with and without contrast. HTN - Bp better but not at goal.  Will increase hydralazine to 50 mg tid Metabolic acidosis - due to #1.  Will add sodium bicarbonate tablets and follow.   Julien Nordmann Aasiyah Auerbach 11/20/2023, 9:23 AM

## 2023-11-20 NOTE — Progress Notes (Signed)
Transition of Care Department [facility]) has reviewed patient and no other TOC needs have been identified at this time. We will continue to monitor patient advancement through interdisciplinary progression rounds. If new patient transition needs arise, please place a TOC consult.   11/20/23 0749  TOC Brief Assessment  Insurance and Status Reviewed  Patient has primary care physician Yes  Home environment has been reviewed Lives with wife.  Prior level of function: Independent.  Prior/Current Home Services No current home services  Social Drivers of Health Review SDOH reviewed no interventions necessary  Readmission risk has been reviewed Yes  Transition of care needs no transition of care needs at this time

## 2023-11-20 NOTE — Plan of Care (Signed)

## 2023-11-20 NOTE — Progress Notes (Signed)
Mobility Specialist Progress Note:    11/20/23 1413  Mobility  Activity Ambulated with assistance in hallway  Level of Assistance Independent  Assistive Device None  Distance Ambulated (ft) 200 ft  Range of Motion/Exercises Active;All extremities  Activity Response Tolerated well  Mobility Referral Yes  Mobility visit 1 Mobility  Mobility Specialist Start Time (ACUTE ONLY) 1300  Mobility Specialist Stop Time (ACUTE ONLY) 1315  Mobility Specialist Time Calculation (min) (ACUTE ONLY) 15 min   Pt received in bed, wife in room. Agreeable to mobility, independently able to stand and ambulate with no AD. Tolerated well, asx throughout. Returned to room, left pt supine. All needs met.   Lawerance Bach Mobility Specialist Please contact via Special educational needs teacher or  Rehab office at [phone]

## 2023-11-20 NOTE — Hospital Course (Signed)
72 y.o. male with a PMH significant for HLD, GERD, renal stones, spinal stenosis, low back pain, dysphagia, OSA, hemorrhoids, insomnia, HTN, diet-controlled diabetes, CKD stage IV awaiting transplant, BPH. At baseline, they live at home with her wife and and are completely independent with ADLs.   They presented from home to the ED on 11/19/2023 due to the recommendation of the urgent care who processed his labs on 12/14 and observed worsening renal function as well as hematuria.  He states that he presented to the urgent care on Saturday due to having right lower flank pain and was treated with tramadol.  Denies any other new medications or over-the-counter's.  His pain since then has resolved.  In review of his chart, he mentioned right groin pain for several days prior to his last PCP appointment on 11/13.  He denies any decrease in p.o. intake but admits to poor hydration. At the time of my evaluation, he has no complaints and feels like he is at his normal baseline. He denies any dysuria, hematuria, flank pain, nausea.  He feels that his urine output is stable from his baseline.  He took his morning chronic medications this morning.  He follows up with nephrology outpatient and is awaiting kidney transplant.   In the ED, it was found that they had hypertension with systolics in the 170s, heart rate in the 50s, normal temperature and 100% oxygen saturation on room air. Significant findings included: Na+ 139, K+ 5.1, glucose 112, BUN 67, creatinine 7.4 (baseline appears to be closer to 5), GFR 7.  CBC appears to be at baseline.  CT renal stone study: 2 x 3 x 6 mm stone in mid right ureter with mild right hydroureteronephrosis, 6 mm nonobstructing stone interpolar right kidney.  Also multiple lesions of bilateral kidneys noted with interval growth from last imaging which could be consistent with cysts versus neoplasm.

## 2023-11-20 NOTE — Progress Notes (Signed)
PROGRESS NOTE   Bruce Weber.  ZOX:[id number] DOB: 28-May-1951 DOA: 11/19/2023 PCP: Deeann Saint, MD   Chief Complaint  Patient presents with   Dizziness   Abnormal Lab   Level of care: Med-Surg  Brief Admission History:  72 y.o. male with a PMH significant for HLD, GERD, renal stones, spinal stenosis, low back pain, dysphagia, OSA, hemorrhoids, insomnia, HTN, diet-controlled diabetes, CKD stage IV awaiting transplant, BPH. At baseline, they live at home with her wife and and are completely independent with ADLs.   They presented from home to the ED on 11/19/2023 due to the recommendation of the urgent care who processed his labs on 12/14 and observed worsening renal function as well as hematuria.  He states that he presented to the urgent care on Saturday due to having right lower flank pain and was treated with tramadol.  Denies any other new medications or over-the-counter's.  His pain since then has resolved.  In review of his chart, he mentioned right groin pain for several days prior to his last PCP appointment on 11/13.  He denies any decrease in p.o. intake but admits to poor hydration. At the time of my evaluation, he has no complaints and feels like he is at his normal baseline. He denies any dysuria, hematuria, flank pain, nausea.  He feels that his urine output is stable from his baseline.  He took his morning chronic medications this morning.  He follows up with nephrology outpatient and is awaiting kidney transplant.   In the ED, it was found that they had hypertension with systolics in the 170s, heart rate in the 50s, normal temperature and 100% oxygen saturation on room air. Significant findings included: Na+ 139, K+ 5.1, glucose 112, BUN 67, creatinine 7.4 (baseline appears to be closer to 5), GFR 7.  CBC appears to be at baseline.  CT renal stone study: 2 x 3 x 6 mm stone in mid right ureter with mild right hydroureteronephrosis, 6 mm nonobstructing stone interpolar  right kidney.  Also multiple lesions of bilateral kidneys noted with interval growth from last imaging which could be consistent with cysts versus neoplasm.   Assessment and Plan:  AKI on CKD stage V  - presented with right hydroureteronephrosis - appreciate nephrology consulted - discussing PD treatment with Dr. Arrie Aran - no urgent need to start RRT at this time per nephrology - may need transfer to [facility] for PD cath placement if renal function worsens  Right hydroureteronephrosis - discussed with urologist Dr. Ronne Binning who agreed to consult   Lesions on both kidneys - enlarging - urology consulted - may need repeat MRI with and without contrast  Essential hypertension  - resume home medication - hydral added by nephrology 12/17  Metabolic acidosis - sodium bicarbonate tabs added by nephrology team - follow daily renal function panel  Nephrolithiasis - pt reports long history of such - urology consultation requested   DVT prophylaxis: sq heparin  Code Status: Full  Family Communication:  Disposition: TBD    Consultants:  Nephrology Urology   Procedures:   Antimicrobials:    Subjective: Pt reports overall weakness and malaise.   Objective: Vitals:   11/19/23 2033 11/20/23 0350 11/20/23 1134 11/20/23 1224  BP: (!) 160/77 (!) 160/82  (!) 153/69  Pulse: 63 66 65 60  Resp: 18 16  16   Temp: 98 F (36.7 C) (!) 97.5 F (36.4 C)  98.9 F (37.2 C)  TempSrc:  Oral  Oral  SpO2: 99% 99%  100%  Weight:      Height:        Intake/Output Summary (Last 24 hours) at 11/20/2023 1511 Last data filed at 11/20/2023 1224 Gross per 24 hour  Intake 1260 ml  Output --  Net 1260 ml   Filed Weights   11/19/23 1312 11/19/23 1808  Weight: 86.6 kg 87.1 kg   Examination:  General exam: lying supine in bed; somnolent but arousable; Appears calm and comfortable  Respiratory system: Clear to auscultation. Respiratory effort normal. Cardiovascular system: normal S1 & S2  heard. No JVD, murmurs, rubs, gallops or clicks. No pedal edema. Gastrointestinal system: Abdomen is nondistended, soft and nontender. No organomegaly or masses felt. Normal bowel sounds heard. Central nervous system: Alert and oriented. No focal neurological deficits. Extremities: Symmetric 5 x 5 power. Skin: No rashes, lesions or ulcers. Psychiatry: Judgement and insight appear normal. Mood & affect appropriate.   Data Reviewed: I have personally reviewed following labs and imaging studies  CBC: Recent Labs  Lab 11/19/23 1334 11/20/23 0415  WBC 8.0 8.8  HGB 10.3* 9.8*  HCT 31.3* 31.0*  MCV 99.4 99.7  PLT 170 150    Basic Metabolic Panel: Recent Labs  Lab 11/17/23 1434 11/19/23 1334 11/20/23 0415  NA 141 139 141  K 5.6* 5.1 5.0  CL 109* 113* 112*  CO2 15* 18* 16*  GLUCOSE 106* 112* 83  BUN 59* 67* 72*  CREATININE 6.85* 7.41* 7.34*  CALCIUM 9.3 9.1 9.1    CBG: No results for input(s): "GLUCAP" in the last 168 hours.  No results found for this or any previous visit (from the past 240 hours).   Radiology Studies: CT Renal Stone Study Result Date: 11/19/2023 CLINICAL DATA:  Abdominal pain.  Kidney stones suspected. EXAM: CT ABDOMEN AND PELVIS WITHOUT CONTRAST TECHNIQUE: Multidetector CT imaging of the abdomen and pelvis was performed following the standard protocol without IV contrast. RADIATION DOSE REDUCTION: This exam was performed according to the departmental dose-optimization program which includes automated exposure control, adjustment of the mA and/or kV according to patient size and/or use of iterative reconstruction technique. COMPARISON:  07/21/2022 FINDINGS: Lower chest: Subsegmental atelectasis or linear scarring noted right lower lobe, similar to prior Hepatobiliary: No suspicious focal abnormality in the liver on this study without intravenous contrast. Gallbladder is distended. No intrahepatic or extrahepatic biliary dilation. Pancreas: No focal mass lesion.  No dilatation of the main duct. No intraparenchymal cyst. No peripancreatic edema. Spleen: No splenomegaly. No suspicious focal mass lesion. Adrenals/Urinary Tract: No adrenal nodule or mass. 6 mm nonobstructing stone identified interpolar right kidney. Mild right hydroureteronephrosis secondary to the presence of a 2 x 3 x 6 mm stone in the mid right ureter (axial 60/2 and coronal 53/5). No stone in the distal right ureter. No definite stones in the left kidney or ureter. No left hydroureteronephrosis. No bladder stones. Similar appearance bladder wall irregularity along the anterior dome, potentially related to a nondistended diverticulum (78/2). Neoplasm is considered less likely but not entirely excluded. Multiple lesions identified in both kidneys of varying size and attenuation. Dominant lesion is a 5.8 for cm homogeneous lesion exophytic from the upper pole left kidney approaching water density, compatible with a cyst. There are multiple small hyperattenuating lesions in both kidneys measuring 15 mm in the upper interpolar right kidney and up to 2.4 cm in the posterior interpolar left kidney. While these are probably cysts complicated by proteinaceous debris or hemorrhage, there are progressive in the interval a neoplasm could have  this appearance. Stomach/Bowel: Tiny hiatal hernia. Stomach otherwise unremarkable. Duodenum is normally positioned as is the ligament of Treitz. No small bowel wall thickening. No small bowel dilatation. The terminal ileum is normal. The appendix is normal. No gross colonic mass. No colonic wall thickening. Diverticuli are seen scattered along the entire length of the colon without CT findings of diverticulitis. Vascular/Lymphatic: There is mild atherosclerotic calcification of the abdominal aorta without aneurysm. There is no gastrohepatic or hepatoduodenal ligament lymphadenopathy. No retroperitoneal or mesenteric lymphadenopathy. No pelvic sidewall lymphadenopathy.  Reproductive: The prostate gland and seminal vesicles are unremarkable. Other: No intraperitoneal free fluid. Musculoskeletal: No worrisome lytic or sclerotic osseous abnormality. IMPRESSION: 1. 2 x 3 x 6 mm stone in the mid right ureter with mild right hydroureteronephrosis. 2. 6 mm nonobstructing stone interpolar right kidney. 3. Multiple lesions in both kidneys of varying size and attenuation. Dominant lesion is a 5.8 cm homogeneous lesion exophytic from the upper pole left kidney in approaching water density, compatible with a cyst. There are multiple small hyperattenuating lesions in both kidneys measuring 15 mm in the upper interpolar right kidney and up to 2.4 cm in the posterior interpolar left kidney. While these are probably cysts complicated by proteinaceous debris or hemorrhage, they are progressive in the interval and a neoplasm could have this appearance. Follow-up outpatient nonemergent renal protocol MRI with and without contrast recommended to further evaluate. 4. Similar appearance bladder wall irregularity along the anterior dome, potentially related to a nondistended diverticulum. Neoplasm is considered less likely but not entirely excluded. Urology consultation recommended. 5. Tiny hiatal hernia. 6.  Aortic Atherosclerosis (ICD10-I70.0). Electronically Signed   By: Kennith Center M.D.   On: 11/19/2023 18:39    Scheduled Meds:  amLODipine  10 mg Oral Daily   calcitRIOL  0.25 mcg Oral QHS   heparin  5,000 Units Subcutaneous Q8H   hydrALAZINE  25 mg Oral Q8H   polyethylene glycol  17 g Oral Daily   propranolol  80 mg Oral BID   sodium bicarbonate  650 mg Oral TID   tamsulosin  0.8 mg Oral Daily   traZODone  50 mg Oral QHS   Continuous Infusions:   LOS: 1 day   Time spent: 57 mins  Celia Gibbons Laural Benes, MD How to contact the [facility] Attending or Consulting provider 7A - 7P or covering provider during after hours 7P -7A, for this patient?  Check the care team in [facility] and look for a)  attending/consulting TR[facility] provider listed and b) the [facility] team listed Log into [email or web address] to find provider on call.  Locate the [facility] provider you are looking for under [facility] and page to a number that you can be directly reached. If you still have difficulty reaching the provider, please page the [facility] (Director on Call) for the Hospitalists listed on amion for assistance.  11/20/2023, 3:11 PM

## 2023-11-21 DIAGNOSIS — I951 Orthostatic hypotension: Secondary | ICD-10-CM | POA: Diagnosis not present

## 2023-11-21 DIAGNOSIS — N179 Acute kidney failure, unspecified: Secondary | ICD-10-CM | POA: Diagnosis not present

## 2023-11-21 DIAGNOSIS — N200 Calculus of kidney: Secondary | ICD-10-CM | POA: Diagnosis not present

## 2023-11-21 DIAGNOSIS — N185 Chronic kidney disease, stage 5: Secondary | ICD-10-CM | POA: Diagnosis not present

## 2023-11-21 LAB — CBC
HCT: 26.8 % — ABNORMAL LOW (ref 39.0–52.0)
Hemoglobin: 8.9 g/dL — ABNORMAL LOW (ref 13.0–17.0)
MCH: 32.4 pg (ref 26.0–34.0)
MCHC: 33.2 g/dL (ref 30.0–36.0)
MCV: 97.5 fL (ref 80.0–100.0)
Platelets: 150 10*3/uL (ref 150–400)
RBC: 2.75 MIL/uL — ABNORMAL LOW (ref 4.22–5.81)
RDW: 14.6 % (ref 11.5–15.5)
WBC: 6.2 10*3/uL (ref 4.0–10.5)
nRBC: 0 % (ref 0.0–0.2)

## 2023-11-21 LAB — RENAL FUNCTION PANEL
Albumin: 3 g/dL — ABNORMAL LOW (ref 3.5–5.0)
Anion gap: 12 (ref 5–15)
BUN: 68 mg/dL — ABNORMAL HIGH (ref 8–23)
CO2: 17 mmol/L — ABNORMAL LOW (ref 22–32)
Calcium: 8.7 mg/dL — ABNORMAL LOW (ref 8.9–10.3)
Chloride: 110 mmol/L (ref 98–111)
Creatinine, Ser: 7.07 mg/dL — ABNORMAL HIGH (ref 0.61–1.24)
GFR, Estimated: 8 mL/min — ABNORMAL LOW (ref >60)
Glucose, Bld: 83 mg/dL (ref 70–99)
Phosphorus: 5.4 mg/dL — ABNORMAL HIGH (ref 2.5–4.6)
Potassium: 5.2 mmol/L — ABNORMAL HIGH (ref 3.5–5.1)
Sodium: 139 mmol/L (ref 135–145)

## 2023-11-21 MED ORDER — SODIUM ZIRCONIUM CYCLOSILICATE 10 G PO PACK
10.0000 g | PACK | Freq: Every day | ORAL | Status: DC
  Administered 2023-11-21 – 2023-11-23 (×3): 10 g via ORAL
  Filled 2023-11-21 (×3): qty 1

## 2023-11-21 MED ORDER — HYDRALAZINE HCL 25 MG PO TABS
50.0000 mg | ORAL_TABLET | Freq: Three times a day (TID) | ORAL | Status: DC
  Administered 2023-11-21 – 2023-11-23 (×5): 50 mg via ORAL
  Filled 2023-11-21 (×5): qty 2

## 2023-11-21 MED ORDER — CALCIUM CARBONATE ANTACID 500 MG PO CHEW
1.0000 | CHEWABLE_TABLET | Freq: Three times a day (TID) | ORAL | Status: AC
  Administered 2023-11-21 – 2023-11-22 (×3): 200 mg via ORAL
  Filled 2023-11-21 (×3): qty 1

## 2023-11-21 MED ORDER — SODIUM ZIRCONIUM CYCLOSILICATE 10 G PO PACK: 10.0000 g | PACK | Freq: Every day | ORAL | Status: DC

## 2023-11-21 MED ORDER — ALUM & MAG HYDROXIDE-SIMETH 200-200-20 MG/5ML PO SUSP: 30.0000 mL | ORAL | Status: DC | PRN

## 2023-11-21 MED ORDER — SODIUM ZIRCONIUM CYCLOSILICATE 10 G PO PACK: 10.0000 g | PACK | Freq: Three times a day (TID) | ORAL | Status: DC

## 2023-11-21 NOTE — Progress Notes (Addendum)
PROGRESS NOTE   Bruce Weber.  XLK:[id number] DOB: 07/05/1951 DOA: 11/19/2023 PCP: Deeann Saint, MD   Chief Complaint  Patient presents with   Dizziness   Abnormal Lab   Level of care: Med-Surg  Brief Admission History:  72 y.o. male with a PMH significant for HLD, GERD, renal stones, spinal stenosis, low back pain, dysphagia, OSA, hemorrhoids, insomnia, HTN, diet-controlled diabetes, CKD stage IV awaiting transplant, BPH. At baseline, they live at home with her wife and and are completely independent with ADLs.   They presented from home to the ED on 11/19/2023 due to the recommendation of the urgent care who processed his labs on 12/14 and observed worsening renal function as well as hematuria.  He states that he presented to the urgent care on Saturday due to having right lower flank pain and was treated with tramadol.  Denies any other new medications or over-the-counter's.  His pain since then has resolved.  In review of his chart, he mentioned right groin pain for several days prior to his last PCP appointment on 11/13.  He denies any decrease in p.o. intake but admits to poor hydration. At the time of my evaluation, he has no complaints and feels like he is at his normal baseline. He denies any dysuria, hematuria, flank pain, nausea.  He feels that his urine output is stable from his baseline.  He took his morning chronic medications this morning.  He follows up with nephrology outpatient and is awaiting kidney transplant.   In the ED, it was found that they had hypertension with systolics in the 170s, heart rate in the 50s, normal temperature and 100% oxygen saturation on room air. Significant findings included: Na+ 139, K+ 5.1, glucose 112, BUN 67, creatinine 7.4 (baseline appears to be closer to 5), GFR 7.  CBC appears to be at baseline.  CT renal stone study: 2 x 3 x 6 mm stone in mid right ureter with mild right hydroureteronephrosis, 6 mm nonobstructing stone interpolar  right kidney.  Also multiple lesions of bilateral kidneys noted with interval growth from last imaging which could be consistent with cysts versus neoplasm.   Assessment and Plan:  AKI on CKD stage V  - presented with right hydroureteronephrosis - appreciate nephrology consultation - pt discussing PD treatment with nephrology - no urgent need to start RRT at this time per nephrology - may need transfer to [facility] for PD cath placement if renal function worsens - continue to monitor renal function for recovery per nephrology team  Hyperkalemia - lokelma added 12/18 - following BMP daily   Normocytic anemia Anemia in CKD - Hg 9.7  - follow CBC   Right hydroureteronephrosis - discussed with urologist Dr. Ronne Binning who agreed to consult  - pt discussed with Dr. Ronne Binning and elected medical propulsion treatment - he is on tamsulosin 0.8 mg daily   Lesions on both kidneys - enlarging lesions noted - urology consulted and arranging for outpatient MRI   Essential hypertension  - resume home medication - hydral added by nephrology 12/17 - blood pressures remain high, increased dose to 50 mg 12/18   Metabolic acidosis - sodium bicarbonate tabs added by nephrology team - follow daily renal function panel  Nephrolithiasis - pt reports long history of such - medical propulsion therapy as noted above  - urology consultation appreciated   DVT prophylaxis: sq heparin  Code Status: Full  Family Communication:  Disposition: TBD    Consultants:  Nephrology Urology  Procedures:   Antimicrobials:    Subjective: Overall feeling fine, no flank pain; he has been straining urine but no stone found yet;   Objective: Vitals:   11/20/23 2105 11/21/23 0000 11/21/23 0515 11/21/23 0823  BP: (!) 171/79 (!) 166/65 121/60 (!) 163/81  Pulse: 62 66 (!) 54 68  Resp: 20  18   Temp: 98.6 F (37 C)  97.8 F (36.6 C)   TempSrc:      SpO2: 100%  97%   Weight:      Height:         Intake/Output Summary (Last 24 hours) at 11/21/2023 1141 Last data filed at 11/21/2023 0900 Gross per 24 hour  Intake 880 ml  Output --  Net 880 ml   Filed Weights   11/19/23 1312 11/19/23 1808  Weight: 86.6 kg 87.1 kg   Examination:  General exam: lying supine in bed; awake, and alert; Appears calm and comfortable  Respiratory system: Clear to auscultation. Respiratory effort normal. Cardiovascular system: normal S1 & S2 heard. No JVD, murmurs, rubs, gallops or clicks. No pedal edema. Gastrointestinal system: Abdomen is nondistended, soft and nontender. No organomegaly or masses felt. Normal bowel sounds heard. Central nervous system: Alert and oriented. No focal neurological deficits. Extremities: Symmetric 5 x 5 power. Skin: No rashes, lesions or ulcers. Psychiatry: Judgement and insight appear normal. Mood & affect appropriate.   Data Reviewed: I have personally reviewed following labs and imaging studies  CBC: Recent Labs  Lab 11/19/23 1334 11/20/23 0415 11/21/23 0428  WBC 8.0 8.8 6.2  HGB 10.3* 9.8* 8.9*  HCT 31.3* 31.0* 26.8*  MCV 99.4 99.7 97.5  PLT 170 150 150    Basic Metabolic Panel: Recent Labs  Lab 11/17/23 1434 11/19/23 1334 11/20/23 0415 11/21/23 0428  NA 141 139 141 139  K 5.6* 5.1 5.0 5.2*  CL 109* 113* 112* 110  CO2 15* 18* 16* 17*  GLUCOSE 106* 112* 83 83  BUN 59* 67* 72* 68*  CREATININE 6.85* 7.41* 7.34* 7.07*  CALCIUM 9.3 9.1 9.1 8.7*  PHOS  --   --   --  5.4*    CBG: Recent Labs  Lab 11/20/23 2108  GLUCAP 108*    No results found for this or any previous visit (from the past 240 hours).   Radiology Studies: CT Renal Stone Study Result Date: 11/19/2023 CLINICAL DATA:  Abdominal pain.  Kidney stones suspected. EXAM: CT ABDOMEN AND PELVIS WITHOUT CONTRAST TECHNIQUE: Multidetector CT imaging of the abdomen and pelvis was performed following the standard protocol without IV contrast. RADIATION DOSE REDUCTION: This exam was  performed according to the departmental dose-optimization program which includes automated exposure control, adjustment of the mA and/or kV according to patient size and/or use of iterative reconstruction technique. COMPARISON:  07/21/2022 FINDINGS: Lower chest: Subsegmental atelectasis or linear scarring noted right lower lobe, similar to prior Hepatobiliary: No suspicious focal abnormality in the liver on this study without intravenous contrast. Gallbladder is distended. No intrahepatic or extrahepatic biliary dilation. Pancreas: No focal mass lesion. No dilatation of the main duct. No intraparenchymal cyst. No peripancreatic edema. Spleen: No splenomegaly. No suspicious focal mass lesion. Adrenals/Urinary Tract: No adrenal nodule or mass. 6 mm nonobstructing stone identified interpolar right kidney. Mild right hydroureteronephrosis secondary to the presence of a 2 x 3 x 6 mm stone in the mid right ureter (axial 60/2 and coronal 53/5). No stone in the distal right ureter. No definite stones in the left kidney or ureter. No left  hydroureteronephrosis. No bladder stones. Similar appearance bladder wall irregularity along the anterior dome, potentially related to a nondistended diverticulum (78/2). Neoplasm is considered less likely but not entirely excluded. Multiple lesions identified in both kidneys of varying size and attenuation. Dominant lesion is a 5.8 for cm homogeneous lesion exophytic from the upper pole left kidney approaching water density, compatible with a cyst. There are multiple small hyperattenuating lesions in both kidneys measuring 15 mm in the upper interpolar right kidney and up to 2.4 cm in the posterior interpolar left kidney. While these are probably cysts complicated by proteinaceous debris or hemorrhage, there are progressive in the interval a neoplasm could have this appearance. Stomach/Bowel: Tiny hiatal hernia. Stomach otherwise unremarkable. Duodenum is normally positioned as is the  ligament of Treitz. No small bowel wall thickening. No small bowel dilatation. The terminal ileum is normal. The appendix is normal. No gross colonic mass. No colonic wall thickening. Diverticuli are seen scattered along the entire length of the colon without CT findings of diverticulitis. Vascular/Lymphatic: There is mild atherosclerotic calcification of the abdominal aorta without aneurysm. There is no gastrohepatic or hepatoduodenal ligament lymphadenopathy. No retroperitoneal or mesenteric lymphadenopathy. No pelvic sidewall lymphadenopathy. Reproductive: The prostate gland and seminal vesicles are unremarkable. Other: No intraperitoneal free fluid. Musculoskeletal: No worrisome lytic or sclerotic osseous abnormality. IMPRESSION: 1. 2 x 3 x 6 mm stone in the mid right ureter with mild right hydroureteronephrosis. 2. 6 mm nonobstructing stone interpolar right kidney. 3. Multiple lesions in both kidneys of varying size and attenuation. Dominant lesion is a 5.8 cm homogeneous lesion exophytic from the upper pole left kidney in approaching water density, compatible with a cyst. There are multiple small hyperattenuating lesions in both kidneys measuring 15 mm in the upper interpolar right kidney and up to 2.4 cm in the posterior interpolar left kidney. While these are probably cysts complicated by proteinaceous debris or hemorrhage, they are progressive in the interval and a neoplasm could have this appearance. Follow-up outpatient nonemergent renal protocol MRI with and without contrast recommended to further evaluate. 4. Similar appearance bladder wall irregularity along the anterior dome, potentially related to a nondistended diverticulum. Neoplasm is considered less likely but not entirely excluded. Urology consultation recommended. 5. Tiny hiatal hernia. 6.  Aortic Atherosclerosis (ICD10-I70.0). Electronically Signed   By: Kennith Center M.D.   On: 11/19/2023 18:39    Scheduled Meds:  amLODipine  10 mg Oral  Daily   calcitRIOL  0.25 mcg Oral QHS   heparin  5,000 Units Subcutaneous Q8H   hydrALAZINE  25 mg Oral Q8H   polyethylene glycol  17 g Oral Daily   propranolol  80 mg Oral BID   sodium bicarbonate  650 mg Oral TID   sodium zirconium cyclosilicate  10 g Oral Daily   tamsulosin  0.8 mg Oral Daily   traZODone  50 mg Oral QHS   Continuous Infusions:   LOS: 2 days   Time spent: 55 mins  Abimelec Grochowski Laural Benes, MD How to contact the [facility] Attending or Consulting provider 7A - 7P or covering provider during after hours 7P -7A, for this patient?  Check the care team in [facility] and look for a) attending/consulting [facility] provider listed and b) the [facility] team listed Log into [email or web address] to find provider on call.  Locate the [facility] provider you are looking for under [facility] and page to a number that you can be directly reached. If you still have difficulty reaching the provider, please page the [facility] (Director  on Call) for the Hospitalists listed on amion for assistance.  11/21/2023, 11:41 AM

## 2023-11-21 NOTE — Progress Notes (Signed)
Patient rested well this shift.  Patient given strainer and has voided several times this shift. No other complaints this shift.

## 2023-11-21 NOTE — Progress Notes (Signed)
Patient placed on full face auto CPAP and tolerating well at this time.

## 2023-11-21 NOTE — Progress Notes (Signed)
[facility] Progress Note  Name: Bruce Weber. MRN: [id number] DOB: 02-19-1951  Chief Complaint:  dizziness  Subjective:  Nephrology was consulted yesterday - seen by his primary nephrologist, Dr. Arrie Aran.  Strict ins/outs are not available.  Urology saw him yesterday.  He elected for medical expulsive therapy per urology charting.  He is normally on flomax 0.4 mg daily at home.  He has had several kidney stones in the past.    Review of systems:  Denies shortness of breath or chest pain  Denies n/v No suprapubic or flank pain   ------------ Background on consult:  Bruce Weber. is an 72 y.o. male has a PMH significant for DM type 2, HTN, HLD, h/o nephrolithiasis, GERD, spinal stenosis, and CKD stage IV-V who presented to [facility] ED on 11/19/23 c/o dizziness.  He presented to Urgent care on 11/17/23 c/o gross hematuria and flank pain.  His Scr was noted to be elevated at that time but was discharged home.  When he presented again on 12/16, Temp 97.9, Bp 175/76, HR 54, SpO2 100%.  Labs notable for K 5.1, Cl 113, Co2 18, BUN 67, Cr 7.41, Hgb 10.3, WBC 8, gluc 112.  CT scan stone protocol revealed stone in mid right ureter with mild right hydroureteronephrosis and 6 mm nonobstructing stone interpolar right kidney.  Also noted were multiple lesions of bilateral kidneys with interval growth from the last imaging and could be cysts vs neoplasm.  He was admitted and we were consulted to further evaluate his AKI/CKD stage IV-V.  The trend in Scr is seen below.  He is well known to Dr. Gaye Pollack from outpatient follow up and his Scr has been elevated but stable for the past 2 years.  He is being evaluated for kidney transplantation and is leaning towards PD, however he had declined PD catheter placement since he had been doing well and his Scr had been stable.  He denies any N/V/D, dysgeusia, anorexia, or malaise.  He reports his flank pain has resolved as well as the gross  hematuria.   Intake/Output Summary (Last 24 hours) at 11/21/2023 0956 Last data filed at 11/21/2023 0500 Gross per 24 hour  Intake 640 ml  Output --  Net 640 ml    Vitals:  Vitals:   11/20/23 2105 11/21/23 0000 11/21/23 0515 11/21/23 0823  BP: (!) 171/79 (!) 166/65 121/60 (!) 163/81  Pulse: 62 66 (!) 54 68  Resp: 20  18   Temp: 98.6 F (37 C)  97.8 F (36.6 C)   TempSrc:      SpO2: 100%  97%   Weight:      Height:         Physical Exam:  General adult male in bed in no acute distress HEENT normocephalic atraumatic extraocular movements intact sclera anicteric Neck supple trachea midline Lungs clear to auscultation bilaterally normal work of breathing at rest  Heart S1S2 no rub Abdomen soft nontender nondistended Extremities no edema  Psych normal mood and affect   Medications reviewed   Labs:     Latest Ref Rng & Units 11/21/2023    4:28 AM 11/20/2023    4:15 AM 11/19/2023    1:34 PM  BMP  Glucose 70 - 99 mg/dL 83  83  102   BUN 8 - 23 mg/dL 68  72  67   Creatinine 0.61 - 1.24 mg/dL 7.25  3.66  4.40   Sodium 135 - 145 mmol/L 139  141  139   Potassium 3.5 - 5.1 mmol/L 5.2  5.0  5.1   Chloride 98 - 111 mmol/L 110  112  113   CO2 22 - 32 mmol/L 17  16  18    Calcium 8.9 - 10.3 mg/dL 8.7  9.1  9.1      Assessment/Plan:   AKI/CKD stage IV-V - possibly due to right hydroureteronephrosis.  Thankfully he is without uremic symptoms.  He is interested in PD and will need to be seen by [facility] for PD catheter placement sooner rather than later.  No urgent indication to start RRT at this time.  Will continue to follow closely.  He may need to be transferred to [facility] for PD catheter placement if his renal function were to worsen.   Would need to show a bit more definitive improvement before he is able to be considered for discharge Avoid nephrotoxic medications including NSAIDs and iodinated intravenous contrast exposure unless the latter is absolutely indicated.   I have  ordered strict ins/outs  Nephrolithiasis - presented with gross hematuria and right flank pain.  Evidence of mid right ureter stone with mild right hydroureteronephrosis.  Appreciate urology. On flomax - dose was doubled from home regimen per patient Hyperkalemia - on lokelma (change to daily) and bicarbonate. 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  Also noted to have proteinaceous/hemorrhagic renal cysts by MRI on 05/10/23.  He is to have outpatient urology follow-up  HTN - improved. Noted recent increase in hydralazine Metabolic acidosis - due to #1.  sodium bicarbonate was added  Disposition - continue inpatient monitoring   Bruce Emms, MD 11/21/2023 10:26 AM

## 2023-11-22 DIAGNOSIS — N2889 Other specified disorders of kidney and ureter: Secondary | ICD-10-CM

## 2023-11-22 DIAGNOSIS — N179 Acute kidney failure, unspecified: Secondary | ICD-10-CM | POA: Insufficient documentation

## 2023-11-22 DIAGNOSIS — N184 Chronic kidney disease, stage 4 (severe): Secondary | ICD-10-CM

## 2023-11-22 DIAGNOSIS — N200 Calculus of kidney: Secondary | ICD-10-CM | POA: Diagnosis not present

## 2023-11-22 LAB — CBC
HCT: 27.6 % — ABNORMAL LOW (ref 39.0–52.0)
Hemoglobin: 8.6 g/dL — ABNORMAL LOW (ref 13.0–17.0)
MCH: 30.9 pg (ref 26.0–34.0)
MCHC: 31.2 g/dL (ref 30.0–36.0)
MCV: 99.3 fL (ref 80.0–100.0)
Platelets: 160 10*3/uL (ref 150–400)
RBC: 2.78 MIL/uL — ABNORMAL LOW (ref 4.22–5.81)
RDW: 14.5 % (ref 11.5–15.5)
WBC: 6.8 10*3/uL (ref 4.0–10.5)
nRBC: 0 % (ref 0.0–0.2)

## 2023-11-22 LAB — RENAL FUNCTION PANEL
Albumin: 3.1 g/dL — ABNORMAL LOW (ref 3.5–5.0)
Anion gap: 11 (ref 5–15)
BUN: 75 mg/dL — ABNORMAL HIGH (ref 8–23)
CO2: 17 mmol/L — ABNORMAL LOW (ref 22–32)
Calcium: 9 mg/dL (ref 8.9–10.3)
Chloride: 111 mmol/L (ref 98–111)
Creatinine, Ser: 7.15 mg/dL — ABNORMAL HIGH (ref 0.61–1.24)
GFR, Estimated: 7 mL/min — ABNORMAL LOW (ref >60)
Glucose, Bld: 82 mg/dL (ref 70–99)
Phosphorus: 5.6 mg/dL — ABNORMAL HIGH (ref 2.5–4.6)
Potassium: 5.2 mmol/L — ABNORMAL HIGH (ref 3.5–5.1)
Sodium: 139 mmol/L (ref 135–145)

## 2023-11-22 MED ORDER — SODIUM CHLORIDE 0.9 % IV SOLN: INTRAVENOUS | Status: AC

## 2023-11-22 MED ORDER — CALCIUM CARBONATE ANTACID 500 MG PO CHEW
1.0000 | CHEWABLE_TABLET | Freq: Two times a day (BID) | ORAL | Status: DC | PRN
  Administered 2023-11-22: 200 mg via ORAL
  Filled 2023-11-22: qty 1

## 2023-11-22 NOTE — Progress Notes (Signed)
Received report from [facility] RN at 1500. Pt has been resting. NAD. A/o. Apple juice given per request.

## 2023-11-22 NOTE — Progress Notes (Signed)
Patient placed on full face auto CPAP for the night.  Patient tolerating well at this time.

## 2023-11-22 NOTE — Progress Notes (Signed)
[facility] Progress Note  Name: Bruce Weber. MRN: [id number] DOB: Oct 05, 1951  Chief Complaint:  dizziness  Subjective: He had 1.4 liters UOP over 12/18 as well as one unmeasured urine void.  He would want PD if needed dialysis.  He is listed for transplant and hoping this comes through.  Isn't aware of passing any stones.  He states he has an appt with Dr. Gaye Pollack coming up 1/9 and blood work setup for 1/2 in prep for that visit.   Review of systems:    Denies shortness of breath or chest pain  Denies n/v No suprapubic or flank pain now - had a bit last night  ------------ Background on consult:  Bruce Weber. is an 72 y.o. male has a PMH significant for DM type 2, HTN, HLD, h/o nephrolithiasis, GERD, spinal stenosis, and CKD stage IV-V who presented to [facility] ED on 11/19/23 c/o dizziness.  He presented to Urgent care on 11/17/23 c/o gross hematuria and flank pain.  His Scr was noted to be elevated at that time but was discharged home.  When he presented again on 12/16, Temp 97.9, Bp 175/76, HR 54, SpO2 100%.  Labs notable for K 5.1, Cl 113, Co2 18, BUN 67, Cr 7.41, Hgb 10.3, WBC 8, gluc 112.  CT scan stone protocol revealed stone in mid right ureter with mild right hydroureteronephrosis and 6 mm nonobstructing stone interpolar right kidney.  Also noted were multiple lesions of bilateral kidneys with interval growth from the last imaging and could be cysts vs neoplasm.  He was admitted and we were consulted to further evaluate his AKI/CKD stage IV-V.  The trend in Scr is seen below.  He is well known to Dr. Gaye Pollack from outpatient follow up and his Scr has been elevated but stable for the past 2 years.  He is being evaluated for kidney transplantation and is leaning towards PD, however he had declined PD catheter placement since he had been doing well and his Scr had been stable.  He denies any N/V/D, dysgeusia, anorexia, or malaise.  He reports his flank pain has resolved as well as  the gross hematuria.   Intake/Output Summary (Last 24 hours) at 11/22/2023 0952 Last data filed at 11/22/2023 0900 Gross per 24 hour  Intake 720 ml  Output 1400 ml  Net -680 ml    Vitals:  Vitals:   11/21/23 1254 11/21/23 2028 11/21/23 2309 11/22/23 0457  BP: (!) 165/74 (!) 158/75  (!) 123/58  Pulse: 66 70 67 61  Resp: 17 19 16 19   Temp: 97.7 F (36.5 C) 98.3 F (36.8 C)  98.4 F (36.9 C)  TempSrc: Oral Oral  Oral  SpO2: 100% 96% 96% 95%  Weight:      Height:         Physical Exam:    General adult male in bed in no acute distress HEENT normocephalic atraumatic extraocular movements intact sclera anicteric Neck supple trachea midline Lungs clear to auscultation bilaterally normal work of breathing at rest  Heart S1S2 no rub Abdomen soft nontender nondistended Extremities no edema  Psych normal mood and affect Neuro - alert and oriented x3 provides hx and follows commands    Medications reviewed   Labs:     Latest Ref Rng & Units 11/22/2023    4:12 AM 11/21/2023    4:28 AM 11/20/2023    4:15 AM  BMP  Glucose 70 - 99 mg/dL 82  83  83  BUN 8 - 23 mg/dL 75  68  72   Creatinine 0.61 - 1.24 mg/dL 2.53  6.64  4.03   Sodium 135 - 145 mmol/L 139  139  141   Potassium 3.5 - 5.1 mmol/L 5.2  5.2  5.0   Chloride 98 - 111 mmol/L 111  110  112   CO2 22 - 32 mmol/L 17  17  16    Calcium 8.9 - 10.3 mg/dL 9.0  8.7  9.1      Assessment/Plan:   AKI/CKD stage IV-V - possibly due to right hydroureteronephrosis.  Thankfully he is without uremic symptoms.  He is interested in PD and will need to be seen by [facility] for PD catheter placement sooner rather than later.   No urgent need for renal replacement therapy Would need to show a bit more definitive improvement before he is able to be considered for discharge.  He may need to be transferred to [facility] for PD catheter placement if his renal function were to worsen.  Right now that is hopefully not the case Avoid nephrotoxic  medications including NSAIDs and iodinated intravenous contrast exposure unless the latter is absolutely indicated.   I have ordered strict ins/outs  Will give fluids today - NS at 75 ml/hr x 12 hours Nephrolithiasis - presented with gross hematuria and right flank pain.  Evidence of mid right ureter stone with mild right hydroureteronephrosis.  Appreciate urology. On flomax - dose was doubled from home regimen per patient Hyperkalemia - on lokelma daily and bicarbonate. 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  Also noted to have proteinaceous/hemorrhagic renal cysts by MRI on 05/10/23.  He is to have outpatient urology follow-up  HTN - improved. Noted recent increase in hydralazine Metabolic acidosis - due to #1.  sodium bicarbonate was added  Disposition - continue inpatient monitoring.  May be able to go as early as tomorrow if labs improve more definitively/stabilize  Estanislado Emms, MD 11/22/2023 10:10 AM

## 2023-11-22 NOTE — Progress Notes (Addendum)
PROGRESS NOTE  Bruce Weber. ZOX:[id number] DOB: November 19, 1951 DOA: 11/19/2023 PCP: Deeann Saint, MD  Brief History:  71 y.o. male with a PMH significant for HLD, GERD, renal stones, spinal stenosis, low back pain, dysphagia, OSA, hemorrhoids, insomnia, HTN, diet-controlled diabetes, CKD stage IV awaiting transplant, BPH. At baseline, they live at home with her wife and and are completely independent with ADLs.   They presented from home to the ED on 11/19/2023 due to the recommendation of the urgent care who processed his labs on 12/14 and observed worsening renal function as well as hematuria.  He states that he presented to the urgent care on Saturday 12/14 due to having right lower flank pain and was treated with tramadol.  Denies any other new medications or over-the-counter's.  His pain since then has resolved.  In review of his chart, he mentioned right groin pain for several days prior to his last PCP appointment on 11/13.  He denies any decrease in p.o. intake but admits to poor hydration. He denies any dysuria, hematuria, flank pain, nausea.  He feels that his urine output is stable from his baseline.  He took his morning chronic medications this morning.  He follows up with nephrology outpatient and is awaiting kidney transplant.   In the ED, it was found that they had hypertension with systolics in the 170s, heart rate in the 50s, normal temperature and 100% oxygen saturation on room air. Significant findings included: Na+ 139, K+ 5.1, glucose 112, BUN 67, creatinine 7.4 (baseline appears to be closer to 5), GFR 7.  CBC appears to be at baseline.  CT renal stone study: 2 x 3 x 6 mm stone in mid right ureter with mild right hydroureteronephrosis, 6 mm nonobstructing stone interpolar right kidney.  Also multiple lesions of bilateral kidneys noted with interval growth from last imaging which could be consistent with cysts versus neoplasm.     Assessment/Plan: AKI on CKD  stage V  - presented with right hydroureteronephrosis -baseline creatinine 4.6-5.0 - appreciate nephrology consultation - pt discussing PD treatment with nephrology - no urgent need to start RRT at this time per nephrology - may need transfer to [facility] for PD cath placement if renal function worsens - continue to monitor renal function for recovery per nephrology team   Hyperkalemia - lokelma added 12/18 - following BMP daily    Normocytic anemia Anemia in CKD - Hg 9.7  - follow CBC    Right hydroureteronephrosis/Right ureteral stone - discussed with urology Dr. Ronne Binning who agreed to consult  - pt discussed with Dr. Ronne Binning and elected medical propulsion treatment - he is on tamsulosin 0.8 mg daily>>continue   Lesions on both kidneys - enlarging lesions noted - urology consulted and arranging for outpatient MRI    Essential hypertension  - resume amlodipine and inderal - hydral added by nephrology 12/17 - blood pressures remain high, increased dose to 50 mg 12/18    Metabolic acidosis - sodium bicarbonate tabs added by nephrology team - follow daily renal function panel   Nephrolithiasis - pt reports long history of such - medical propulsion therapy as noted above  - urology consultation appreciated   Enlarging lesions of both kidneys  - he was evaluated at [facility] transplant clinic and felt to have 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  Also noted to have proteinaceous/hemorrhagic renal cysts by MRI on 05/10/23.  He is to have outpatient urology follow-up  Family Communication:  no Family at bedside  Consultants:  renal, urology  Code Status:  FULL  DVT Prophylaxis:  [place] Heparin   Procedures: As Listed in Progress Note Above  Antibiotics: None        Subjective: Pt has some right flank pain x 5-10 min this am.  Otherwise pain is 90% better.  Denies f/c, cp, sob, n/v/d, abd pain  Objective: Vitals:   11/21/23 2028 11/21/23 2309 11/22/23 0457  11/22/23 1300  BP: (!) 158/75  (!) 123/58 (!) 141/77  Pulse: 70 67 61 60  Resp: 19 16 19 17   Temp: 98.3 F (36.8 C)  98.4 F (36.9 C) 98.4 F (36.9 C)  TempSrc: Oral  Oral Oral  SpO2: 96% 96% 95% 100%  Weight:      Height:        Intake/Output Summary (Last 24 hours) at 11/22/2023 1546 Last data filed at 11/22/2023 1300 Gross per 24 hour  Intake 720 ml  Output 1650 ml  Net -930 ml   Weight change:  Exam:  General:  Pt is alert, follows commands appropriately, not in acute distress HEENT: No icterus, No thrush, No neck mass, [place]/AT Cardiovascular: RRR, S1/S2, no rubs, no gallops Respiratory: CTA bilaterally, no wheezing, no crackles, no rhonchi Abdomen: Soft/+BS, non tender, non distended, no guarding Extremities: No edema, No lymphangitis, No petechiae, No rashes, no synovitis   Data Reviewed: I have personally reviewed following labs and imaging studies Basic Metabolic Panel: Recent Labs  Lab 11/17/23 1434 11/19/23 1334 11/20/23 0415 11/21/23 0428 11/22/23 0412  NA 141 139 141 139 139  K 5.6* 5.1 5.0 5.2* 5.2*  CL 109* 113* 112* 110 111  CO2 15* 18* 16* 17* 17*  GLUCOSE 106* 112* 83 83 82  BUN 59* 67* 72* 68* 75*  CREATININE 6.85* 7.41* 7.34* 7.07* 7.15*  CALCIUM 9.3 9.1 9.1 8.7* 9.0  PHOS  --   --   --  5.4* 5.6*   Liver Function Tests: Recent Labs  Lab 11/21/23 0428 11/22/23 0412  ALBUMIN 3.0* 3.1*   No results for input(s): "LIPASE", "AMYLASE" in the last 168 hours. No results for input(s): "AMMONIA" in the last 168 hours. Coagulation Profile: No results for input(s): "INR", "PROTIME" in the last 168 hours. CBC: Recent Labs  Lab 11/19/23 1334 11/20/23 0415 11/21/23 0428 11/22/23 0412  WBC 8.0 8.8 6.2 6.8  HGB 10.3* 9.8* 8.9* 8.6*  HCT 31.3* 31.0* 26.8* 27.6*  MCV 99.4 99.7 97.5 99.3  PLT 170 150 150 160   Cardiac Enzymes: No results for input(s): "CKTOTAL", "CKMB", "CKMBINDEX", "TROPONINI" in the last 168 hours. BNP: Invalid input(s):  "POCBNP" CBG: Recent Labs  Lab 11/20/23 2108  GLUCAP 108*   HbA1C: No results for input(s): "HGBA1C" in the last 72 hours. Urine analysis:    Component Value Date/Time   COLORURINE STRAW (A) 11/19/2023 1805   APPEARANCEUR CLEAR 11/19/2023 1805   LABSPEC 1.008 11/19/2023 1805   PHURINE 7.0 11/19/2023 1805   GLUCOSEU NEGATIVE 11/19/2023 1805   HGBUR SMALL (A) 11/19/2023 1805   BILIRUBINUR NEGATIVE 11/19/2023 1805   BILIRUBINUR negative 11/17/2023 1418   BILIRUBINUR neg 07/27/2022 1041   KETONESUR NEGATIVE 11/19/2023 1805   PROTEINUR 100 (A) 11/19/2023 1805   UROBILINOGEN 0.2 11/17/2023 1418   NITRITE NEGATIVE 11/19/2023 1805   LEUKOCYTESUR NEGATIVE 11/19/2023 1805   Sepsis Labs: @LABRCNTIP (procalcitonin:4,lacticidven:4) )No results found for this or any previous visit (from the past 240 hours).   Scheduled Meds:  amLODipine  10  mg Oral Daily   calcitRIOL  0.25 mcg Oral QHS   heparin  5,000 Units Subcutaneous Q8H   hydrALAZINE  50 mg Oral Q8H   polyethylene glycol  17 g Oral Daily   propranolol  80 mg Oral BID   sodium bicarbonate  650 mg Oral TID   sodium zirconium cyclosilicate  10 g Oral Daily   tamsulosin  0.8 mg Oral Daily   traZODone  50 mg Oral QHS   Continuous Infusions:  sodium chloride 75 mL/hr at 11/22/23 1031    Procedures/Studies: CT Renal Stone Study Result Date: 11/19/2023 CLINICAL DATA:  Abdominal pain.  Kidney stones suspected. EXAM: CT ABDOMEN AND PELVIS WITHOUT CONTRAST TECHNIQUE: Multidetector CT imaging of the abdomen and pelvis was performed following the standard protocol without IV contrast. RADIATION DOSE REDUCTION: This exam was performed according to the departmental dose-optimization program which includes automated exposure control, adjustment of the mA and/or kV according to patient size and/or use of iterative reconstruction technique. COMPARISON:  07/21/2022 FINDINGS: Lower chest: Subsegmental atelectasis or linear scarring noted right  lower lobe, similar to prior Hepatobiliary: No suspicious focal abnormality in the liver on this study without intravenous contrast. Gallbladder is distended. No intrahepatic or extrahepatic biliary dilation. Pancreas: No focal mass lesion. No dilatation of the main duct. No intraparenchymal cyst. No peripancreatic edema. Spleen: No splenomegaly. No suspicious focal mass lesion. Adrenals/Urinary Tract: No adrenal nodule or mass. 6 mm nonobstructing stone identified interpolar right kidney. Mild right hydroureteronephrosis secondary to the presence of a 2 x 3 x 6 mm stone in the mid right ureter (axial 60/2 and coronal 53/5). No stone in the distal right ureter. No definite stones in the left kidney or ureter. No left hydroureteronephrosis. No bladder stones. Similar appearance bladder wall irregularity along the anterior dome, potentially related to a nondistended diverticulum (78/2). Neoplasm is considered less likely but not entirely excluded. Multiple lesions identified in both kidneys of varying size and attenuation. Dominant lesion is a 5.8 for cm homogeneous lesion exophytic from the upper pole left kidney approaching water density, compatible with a cyst. There are multiple small hyperattenuating lesions in both kidneys measuring 15 mm in the upper interpolar right kidney and up to 2.4 cm in the posterior interpolar left kidney. While these are probably cysts complicated by proteinaceous debris or hemorrhage, there are progressive in the interval a neoplasm could have this appearance. Stomach/Bowel: Tiny hiatal hernia. Stomach otherwise unremarkable. Duodenum is normally positioned as is the ligament of Treitz. No small bowel wall thickening. No small bowel dilatation. The terminal ileum is normal. The appendix is normal. No gross colonic mass. No colonic wall thickening. Diverticuli are seen scattered along the entire length of the colon without CT findings of diverticulitis. Vascular/Lymphatic: There is  mild atherosclerotic calcification of the abdominal aorta without aneurysm. There is no gastrohepatic or hepatoduodenal ligament lymphadenopathy. No retroperitoneal or mesenteric lymphadenopathy. No pelvic sidewall lymphadenopathy. Reproductive: The prostate gland and seminal vesicles are unremarkable. Other: No intraperitoneal free fluid. Musculoskeletal: No worrisome lytic or sclerotic osseous abnormality. IMPRESSION: 1. 2 x 3 x 6 mm stone in the mid right ureter with mild right hydroureteronephrosis. 2. 6 mm nonobstructing stone interpolar right kidney. 3. Multiple lesions in both kidneys of varying size and attenuation. Dominant lesion is a 5.8 cm homogeneous lesion exophytic from the upper pole left kidney in approaching water density, compatible with a cyst. There are multiple small hyperattenuating lesions in both kidneys measuring 15 mm in the upper interpolar right kidney  and up to 2.4 cm in the posterior interpolar left kidney. While these are probably cysts complicated by proteinaceous debris or hemorrhage, they are progressive in the interval and a neoplasm could have this appearance. Follow-up outpatient nonemergent renal protocol MRI with and without contrast recommended to further evaluate. 4. Similar appearance bladder wall irregularity along the anterior dome, potentially related to a nondistended diverticulum. Neoplasm is considered less likely but not entirely excluded. Urology consultation recommended. 5. Tiny hiatal hernia. 6.  Aortic Atherosclerosis (ICD10-I70.0). Electronically Signed   By: Kennith Center M.D.   On: 11/19/2023 18:39   DG Foot Complete Right Result Date: 11/14/2023 Please see detailed radiograph report in office note.  DG Foot Complete Left Result Date: 11/14/2023 Please see detailed radiograph report in office note.   Catarina Hartshorn, DO  [facility]  If 7PM-7AM, please contact night-coverage [email or web address] Password [email or web address] 11/22/2023, 3:46 PM   LOS: 3 days

## 2023-11-23 DIAGNOSIS — N200 Calculus of kidney: Secondary | ICD-10-CM | POA: Diagnosis not present

## 2023-11-23 DIAGNOSIS — N2889 Other specified disorders of kidney and ureter: Secondary | ICD-10-CM | POA: Diagnosis not present

## 2023-11-23 DIAGNOSIS — N179 Acute kidney failure, unspecified: Secondary | ICD-10-CM | POA: Diagnosis not present

## 2023-11-23 DIAGNOSIS — I951 Orthostatic hypotension: Secondary | ICD-10-CM | POA: Diagnosis not present

## 2023-11-23 LAB — CBC
HCT: 27.2 % — ABNORMAL LOW (ref 39.0–52.0)
Hemoglobin: 9 g/dL — ABNORMAL LOW (ref 13.0–17.0)
MCH: 32.1 pg (ref 26.0–34.0)
MCHC: 33.1 g/dL (ref 30.0–36.0)
MCV: 97.1 fL (ref 80.0–100.0)
Platelets: 159 10*3/uL (ref 150–400)
RBC: 2.8 MIL/uL — ABNORMAL LOW (ref 4.22–5.81)
RDW: 14.5 % (ref 11.5–15.5)
WBC: 7.3 10*3/uL (ref 4.0–10.5)
nRBC: 0 % (ref 0.0–0.2)

## 2023-11-23 LAB — RENAL FUNCTION PANEL
Albumin: 3.1 g/dL — ABNORMAL LOW (ref 3.5–5.0)
Anion gap: 10 (ref 5–15)
BUN: 69 mg/dL — ABNORMAL HIGH (ref 8–23)
CO2: 17 mmol/L — ABNORMAL LOW (ref 22–32)
Calcium: 9.2 mg/dL (ref 8.9–10.3)
Chloride: 112 mmol/L — ABNORMAL HIGH (ref 98–111)
Creatinine, Ser: 6.65 mg/dL — ABNORMAL HIGH (ref 0.61–1.24)
GFR, Estimated: 8 mL/min — ABNORMAL LOW (ref >60)
Glucose, Bld: 87 mg/dL (ref 70–99)
Phosphorus: 5.5 mg/dL — ABNORMAL HIGH (ref 2.5–4.6)
Potassium: 4.8 mmol/L (ref 3.5–5.1)
Sodium: 139 mmol/L (ref 135–145)

## 2023-11-23 MED ORDER — TAMSULOSIN HCL 0.4 MG PO CAPS: 0.8000 mg | ORAL_CAPSULE | Freq: Every day | ORAL | 1 refills | Status: AC

## 2023-11-23 MED ORDER — ALLOPURINOL 100 MG PO TABS: ORAL_TABLET | ORAL | Status: DC

## 2023-11-23 MED ORDER — SODIUM BICARBONATE 650 MG PO TABS: 650.0000 mg | ORAL_TABLET | Freq: Three times a day (TID) | ORAL | 1 refills | Status: DC

## 2023-11-23 MED ORDER — SODIUM ZIRCONIUM CYCLOSILICATE 10 G PO PACK: 10.0000 g | PACK | Freq: Every day | ORAL | 1 refills | Status: DC

## 2023-11-23 MED ORDER — DARBEPOETIN ALFA 40 MCG/0.4ML IJ SOSY
40.0000 ug | PREFILLED_SYRINGE | Freq: Once | INTRAMUSCULAR | Status: AC
  Administered 2023-11-23: 40 ug via SUBCUTANEOUS
  Filled 2023-11-23: qty 0.4

## 2023-11-23 NOTE — Care Management Important Message (Signed)
Important Message  Patient Details  Name: Bruce Weber. MRN: [id number] Date of Birth: September 06, 1951   Important Message Given:  Yes - Medicare IM     Corey Harold 11/23/2023, 11:06 AM

## 2023-11-23 NOTE — Discharge Summary (Signed)
Physician Discharge Summary   Patient: Bruce Weber. MRN: [id number] DOB: 04/23/1951  Admit date:     11/19/2023  Discharge date: 11/23/23  Discharge Physician: Onalee Hua Breyona Swander   PCP: Deeann Saint, MD   Recommendations at discharge:   Please follow up with primary care provider within 1-2 weeks  Please repeat BMP and CBC in one week     Hospital Course: 72 y.o. male with a PMH significant for HLD, GERD, renal stones, spinal stenosis, low back pain, dysphagia, OSA, hemorrhoids, insomnia, HTN, diet-controlled diabetes, CKD stage IV awaiting transplant, BPH. At baseline, they live at home with her wife and and are completely independent with ADLs.   They presented from home to the ED on 11/19/2023 due to the recommendation of the urgent care who processed his labs on 12/14 and observed worsening renal function as well as hematuria.  He states that he presented to the urgent care on Saturday 12/14 due to having right lower flank pain and was treated with tramadol.  Denies any other new medications or over-the-counter's.  His pain since then has resolved.  In review of his chart, he mentioned right groin pain for several days prior to his last PCP appointment on 11/13.  He denies any decrease in p.o. intake but admits to poor hydration. He denies any dysuria, hematuria, flank pain, nausea.  He feels that his urine output is stable from his baseline.  He took his morning chronic medications this morning.  He follows up with nephrology outpatient and is awaiting kidney transplant.   In the ED, it was found that they had hypertension with systolics in the 170s, heart rate in the 50s, normal temperature and 100% oxygen saturation on room air. Significant findings included: Na+ 139, K+ 5.1, glucose 112, BUN 67, creatinine 7.4 (baseline appears to be closer to 5), GFR 7.  CBC appears to be at baseline.  CT renal stone study: 2 x 3 x 6 mm stone in mid right ureter with mild right hydroureteronephrosis,  6 mm nonobstructing stone interpolar right kidney.  Also multiple lesions of bilateral kidneys noted with interval growth from last imaging which could be consistent with cysts versus neoplasm.    Assessment and Plan: AKI on CKD stage V  - presented with right hydroureteronephrosis -baseline creatinine 4.6-5.0 - appreciate nephrology consultation - pt discussing PD treatment with nephrology - no urgent need to start RRT at this time per nephrology - pt was intermittently given IVF during the hospitalization - continue to monitor renal function for recovery per nephrology team -12/20--cleared by nephrology for d/c>>they will arrange follow up -pt has appointment with transplant team on 12/12/22 -serum creatinine 6.65 on day of d/c   Hyperkalemia - lokelma daily added 12/18 - following BMP daily  -continue bicarbonate   Normocytic anemia Anemia in CKD - baseline Hgb in 9 range - follow CBC    Right hydroureteronephrosis/Right ureteral stone - discussed with urology Dr. Ronne Binning who agreed to consult  - pt discussed with Dr. Ronne Binning and elected medical propulsion treatment - he is on tamsulosin 0.8 mg daily>>continue -outpt follow up with urology   Lesions on both kidneys - enlarging lesions noted - urology consulted and arranging for outpatient MRI    Essential hypertension  - resume amlodipine and inderal - hydral added by nephrology 12/17 - blood pressures remain high, increased dose to 50 mg 12/18    Metabolic acidosis - sodium bicarbonate tabs added by nephrology team - follow daily renal function  panel   Nephrolithiasis - pt reports long history of such - medical propulsion therapy as noted above  - urology consultation appreciated    Enlarging lesions of both kidneys  - he was evaluated at [facility] transplant clinic and felt to have 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  Also noted to have proteinaceous/hemorrhagic renal cysts by MRI on 05/10/23.  He is to have  outpatient urology follow-up          Consultants: renal Procedures performed: none  Disposition: Home Diet recommendation:  Renal diet DISCHARGE MEDICATION: Allergies as of 11/23/2023       Reactions   Dye Fdc Blue [brilliant Blue Fcf (fd&c Blue #1)]    PT UNSURE WHICH DYE   Iodinated Contrast Media Hives   30 YEARS AGO, recent 2021 epidural with contrast ok        Medication List     TAKE these medications    acetaminophen 325 MG tablet Commonly known as: Tylenol Take 2 tablets (650 mg total) by mouth every 6 (six) hours as needed for moderate pain.   allopurinol 100 MG tablet Commonly known as: ZYLOPRIM Take 50 mg twice per week What changed:  how much to take how to take this when to take this additional instructions   amLODipine 10 MG tablet Commonly known as: NORVASC TAKE 1 TABLET BY MOUTH EVERYDAY AT BEDTIME   atorvastatin 10 MG tablet Commonly known as: LIPITOR TAKE 1 TABLET BY MOUTH EVERY DAY (NEED PHYSICAL)   calcitRIOL 0.25 MCG capsule Commonly known as: ROCALTROL Take 0.25 mcg by mouth at bedtime.   famotidine 40 MG tablet Commonly known as: PEPCID TAKE 1 TABLET (40 MG TOTAL) BY MOUTH DAILY. WITH BREAKFAST   fluticasone 50 MCG/ACT nasal spray Commonly known as: FLONASE Place 1 spray into both nostrils daily for 14 days. What changed:  when to take this reasons to take this   furosemide 40 MG tablet Commonly known as: LASIX Take 40 mg by mouth daily.   gabapentin 100 MG capsule Commonly known as: NEURONTIN TAKE 1 CAPSULE BY MOUTH THREE TIMES A DAY   hydrALAZINE 25 MG tablet Commonly known as: APRESOLINE TAKE 1 (ONE) TABLET THREE TIMES DAILY   propranolol 80 MG tablet Commonly known as: INDERAL TAKE 1 TABLET BY MOUTH TWICE A DAY   rosuvastatin 5 MG tablet Commonly known as: CRESTOR Take 5 mg by mouth daily.   sodium bicarbonate 650 MG tablet Take 1 tablet (650 mg total) by mouth 3 (three) times daily.   sodium zirconium  cyclosilicate 10 g Pack packet Commonly known as: LOKELMA Take 10 g by mouth daily. Start taking on: November 24, 2023   tamsulosin 0.4 MG Caps capsule Commonly known as: FLOMAX Take 2 capsules (0.8 mg total) by mouth daily. Start taking on: November 24, 2023 What changed: how much to take   traMADol 50 MG tablet Commonly known as: ULTRAM Take 1 tablet (50 mg total) by mouth 3 (three) times daily as needed.   traZODone 50 MG tablet Commonly known as: DESYREL TAKE 1 TABLET BY MOUTH EVERYDAY AT BEDTIME What changed: See the new instructions.        Discharge Exam: Filed Weights   11/19/23 1312 11/19/23 1808  Weight: 86.6 kg 87.1 kg   HEENT:  [place]/AT, No thrush, no icterus CV:  RRR, no rub, no S3, no S4 Lung:  CTA, no wheeze, no rhonchi Abd:  soft/+BS, NT Ext:  No edema, no lymphangitis, no synovitis, no rash  Condition at discharge: stable  The results of significant diagnostics from this hospitalization (including imaging, microbiology, ancillary and laboratory) are listed below for reference.   Imaging Studies: CT Renal Stone Study Result Date: 11/19/2023 CLINICAL DATA:  Abdominal pain.  Kidney stones suspected. EXAM: CT ABDOMEN AND PELVIS WITHOUT CONTRAST TECHNIQUE: Multidetector CT imaging of the abdomen and pelvis was performed following the standard protocol without IV contrast. RADIATION DOSE REDUCTION: This exam was performed according to the departmental dose-optimization program which includes automated exposure control, adjustment of the mA and/or kV according to patient size and/or use of iterative reconstruction technique. COMPARISON:  07/21/2022 FINDINGS: Lower chest: Subsegmental atelectasis or linear scarring noted right lower lobe, similar to prior Hepatobiliary: No suspicious focal abnormality in the liver on this study without intravenous contrast. Gallbladder is distended. No intrahepatic or extrahepatic biliary dilation. Pancreas: No focal mass lesion. No  dilatation of the main duct. No intraparenchymal cyst. No peripancreatic edema. Spleen: No splenomegaly. No suspicious focal mass lesion. Adrenals/Urinary Tract: No adrenal nodule or mass. 6 mm nonobstructing stone identified interpolar right kidney. Mild right hydroureteronephrosis secondary to the presence of a 2 x 3 x 6 mm stone in the mid right ureter (axial 60/2 and coronal 53/5). No stone in the distal right ureter. No definite stones in the left kidney or ureter. No left hydroureteronephrosis. No bladder stones. Similar appearance bladder wall irregularity along the anterior dome, potentially related to a nondistended diverticulum (78/2). Neoplasm is considered less likely but not entirely excluded. Multiple lesions identified in both kidneys of varying size and attenuation. Dominant lesion is a 5.8 for cm homogeneous lesion exophytic from the upper pole left kidney approaching water density, compatible with a cyst. There are multiple small hyperattenuating lesions in both kidneys measuring 15 mm in the upper interpolar right kidney and up to 2.4 cm in the posterior interpolar left kidney. While these are probably cysts complicated by proteinaceous debris or hemorrhage, there are progressive in the interval a neoplasm could have this appearance. Stomach/Bowel: Tiny hiatal hernia. Stomach otherwise unremarkable. Duodenum is normally positioned as is the ligament of Treitz. No small bowel wall thickening. No small bowel dilatation. The terminal ileum is normal. The appendix is normal. No gross colonic mass. No colonic wall thickening. Diverticuli are seen scattered along the entire length of the colon without CT findings of diverticulitis. Vascular/Lymphatic: There is mild atherosclerotic calcification of the abdominal aorta without aneurysm. There is no gastrohepatic or hepatoduodenal ligament lymphadenopathy. No retroperitoneal or mesenteric lymphadenopathy. No pelvic sidewall lymphadenopathy. Reproductive:  The prostate gland and seminal vesicles are unremarkable. Other: No intraperitoneal free fluid. Musculoskeletal: No worrisome lytic or sclerotic osseous abnormality. IMPRESSION: 1. 2 x 3 x 6 mm stone in the mid right ureter with mild right hydroureteronephrosis. 2. 6 mm nonobstructing stone interpolar right kidney. 3. Multiple lesions in both kidneys of varying size and attenuation. Dominant lesion is a 5.8 cm homogeneous lesion exophytic from the upper pole left kidney in approaching water density, compatible with a cyst. There are multiple small hyperattenuating lesions in both kidneys measuring 15 mm in the upper interpolar right kidney and up to 2.4 cm in the posterior interpolar left kidney. While these are probably cysts complicated by proteinaceous debris or hemorrhage, they are progressive in the interval and a neoplasm could have this appearance. Follow-up outpatient nonemergent renal protocol MRI with and without contrast recommended to further evaluate. 4. Similar appearance bladder wall irregularity along the anterior dome, potentially related to a nondistended diverticulum. Neoplasm is  considered less likely but not entirely excluded. Urology consultation recommended. 5. Tiny hiatal hernia. 6.  Aortic Atherosclerosis (ICD10-I70.0). Electronically Signed   By: Kennith Center M.D.   On: 11/19/2023 18:39   DG Foot Complete Right Result Date: 11/14/2023 Please see detailed radiograph report in office note.  DG Foot Complete Left Result Date: 11/14/2023 Please see detailed radiograph report in office note.   Microbiology: Results for orders placed or performed during the hospital encounter of 12/11/22  SARS CORONAVIRUS 2 (Shykeria Sakamoto 6-24 HRS) Anterior Nasal Swab     Status: None   Collection Time: 12/11/22  4:40 PM   Specimen: Anterior Nasal Swab  Result Value Ref Range Status   SARS Coronavirus 2 NEGATIVE NEGATIVE Final    Comment: (NOTE) SARS-CoV-2 target nucleic acids are NOT DETECTED.  The  SARS-CoV-2 RNA is generally detectable in upper and lower respiratory specimens during the acute phase of infection. Negative results do not preclude SARS-CoV-2 infection, do not rule out co-infections with other pathogens, and should not be used as the sole basis for treatment or other patient management decisions. Negative results must be combined with clinical observations, patient history, and epidemiological information. The expected result is Negative.  Fact Sheet for Patients: HairSlick.no  Fact Sheet for Healthcare Providers: quierodirigir.com  This test is not yet approved or cleared by the [place] FDA and  has been authorized for detection and/or diagnosis of SARS-CoV-2 by FDA under an Emergency Use Authorization (EUA). This EUA will remain  in effect (meaning this test can be used) for the duration of the COVID-19 declaration under Se ction 564(b)(1) of the Act, 21 U.S.C. section 360bbb-3(b)(1), unless the authorization is terminated or revoked sooner.  Performed at [facility], [address], [place], [place] [address]     Labs: CBC: Recent Labs  Lab 11/19/23 1334 11/20/23 0415 11/21/23 0428 11/22/23 0412 11/23/23 0423  WBC 8.0 8.8 6.2 6.8 7.3  HGB 10.3* 9.8* 8.9* 8.6* 9.0*  HCT 31.3* 31.0* 26.8* 27.6* 27.2*  MCV 99.4 99.7 97.5 99.3 97.1  PLT 170 150 150 160 159   Basic Metabolic Panel: Recent Labs  Lab 11/19/23 1334 11/20/23 0415 11/21/23 0428 11/22/23 0412 11/23/23 0423  NA 139 141 139 139 139  K 5.1 5.0 5.2* 5.2* 4.8  CL 113* 112* 110 111 112*  CO2 18* 16* 17* 17* 17*  GLUCOSE 112* 83 83 82 87  BUN 67* 72* 68* 75* 69*  CREATININE 7.41* 7.34* 7.07* 7.15* 6.65*  CALCIUM 9.1 9.1 8.7* 9.0 9.2  PHOS  --   --  5.4* 5.6* 5.5*   Liver Function Tests: Recent Labs  Lab 11/21/23 0428 11/22/23 0412 11/23/23 0423  ALBUMIN 3.0* 3.1* 3.1*   CBG: Recent Labs  Lab 11/20/23 2108   GLUCAP 108*    Discharge time spent: greater than 30 minutes.  Signed: Catarina Hartshorn, MD [facility] 11/23/2023

## 2023-11-23 NOTE — Progress Notes (Signed)
[facility] Progress Note  Name: Bruce Weber. MRN: [id number] DOB: [birth year]  Chief Complaint:  dizziness  Subjective: He had 950 mL UOP over 12/19 charted.  He would want PD if he needed dialysis - still hoping for a transplant soon.  His primary nephrologist has referred him for [facility] eval for PD catheter placement.  We discussed low K diet.  He has gotten IV iron two doses outpatient for anemia.  He was previously on retacrit.  We discussed the risks/benefits/indications for ESA and he does consent to ESA.  No difficulty with urination and not aware of passing any stones.  Review of systems:     Denies shortness of breath or chest pain  Denies n/v No suprapubic or flank pain   ------------ Background on consult:  Aksh Derda. is an 72 y.o. male has a PMH significant for DM type 2, HTN, HLD, h/o nephrolithiasis, GERD, spinal stenosis, and CKD stage IV-V who presented to [facility] ED on 11/19/23 c/o dizziness.  He presented to Urgent care on 11/17/23 c/o gross hematuria and flank pain.  His Scr was noted to be elevated at that time but was discharged home.  When he presented again on 12/16, Temp 97.9, Bp 175/76, HR 54, SpO2 100%.  Labs notable for K 5.1, Cl 113, Co2 18, BUN 67, Cr 7.41, Hgb 10.3, WBC 8, gluc 112.  CT scan stone protocol revealed stone in mid right ureter with mild right hydroureteronephrosis and 6 mm nonobstructing stone interpolar right kidney.  Also noted were multiple lesions of bilateral kidneys with interval growth from the last imaging and could be cysts vs neoplasm.  He was admitted and we were consulted to further evaluate his AKI/CKD stage IV-V.  The trend in Scr is seen below.  He is well known to Dr. Gaye Pollack from outpatient follow up and his Scr has been elevated but stable for the past 2 years.  He is being evaluated for kidney transplantation and is leaning towards PD, however he had declined PD catheter placement since he had been doing well and his Scr  had been stable.  He denies any N/V/D, dysgeusia, anorexia, or malaise.  He reports his flank pain has resolved as well as the gross hematuria.   Intake/Output Summary (Last 24 hours) at 11/23/2023 0948 Last data filed at 11/23/2023 0558 Gross per 24 hour  Intake 1080 ml  Output 950 ml  Net 130 ml    Vitals:  Vitals:   11/22/23 2047 11/22/23 2203 11/23/23 0329 11/23/23 0558  BP: (!) 175/87  136/89 (!) 158/74  Pulse: 64 65 60   Resp: 19  18   Temp: 98.2 F (36.8 C)  98.3 F (36.8 C)   TempSrc: Oral  Oral   SpO2: 97%  98%   Weight:      Height:         Physical Exam:     General adult male in bed in no acute distress HEENT normocephalic atraumatic extraocular movements intact sclera anicteric Neck supple trachea midline Lungs clear to auscultation bilaterally normal work of breathing at rest  Heart S1S2 no rub Abdomen soft nontender nondistended Extremities no edema  Psych normal mood and affect Neuro - alert and oriented x3 provides hx and follows commands    Medications reviewed   Labs:     Latest Ref Rng & Units 11/23/2023    4:23 AM 11/22/2023    4:12 AM 11/21/2023    4:28 AM  BMP  Glucose 70 - 99 mg/dL 87  82  83   BUN 8 - 23 mg/dL 69  75  68   Creatinine 0.61 - 1.24 mg/dL 9.56  2.13  0.86   Sodium 135 - 145 mmol/L 139  139  139   Potassium 3.5 - 5.1 mmol/L 4.8  5.2  5.2   Chloride 98 - 111 mmol/L 112  111  110   CO2 22 - 32 mmol/L 17  17  17    Calcium 8.9 - 10.3 mg/dL 9.2  9.0  8.7      Assessment/Plan:   AKI/CKD stage IV-V - possibly due to right hydroureteronephrosis.  Thankfully he is without uremic symptoms.  He is interested in PD and will need to be seen by [facility] for PD catheter placement sooner rather than later.   No urgent need for renal replacement therapy He has an appt with Dr. Gaye Pollack coming up 1/9 and blood work setup for 1/2 in prep for that visit.  He has been referred to [facility] for evaluation Avoid nephrotoxic medications including NSAIDs  and iodinated intravenous contrast exposure unless the latter is absolutely indicated.   Nephrolithiasis - presented with gross hematuria and right flank pain.  Evidence of mid right ureter stone with mild right hydroureteronephrosis.  Appreciate urology. On flomax - dose was doubled from home regimen per patient Hyperkalemia - was on lokelma daily here - continue if able.  Continue bicarbonate.  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  Also noted to have proteinaceous/hemorrhagic renal cysts by MRI on 05/10/23.  He is to have outpatient urology follow-up  HTN - improved. Noted recent increase in hydralazine.  Stopped fluids.  Metabolic acidosis - due to #1.  sodium bicarbonate was added Anemia of CKD - will give aranesp 40 mcg once today  Disposition - stable for discharge from a strictly renal standpoint.  Close follow-up is set up as above with our office  Estanislado Emms, MD 11/23/2023 10:07 AM

## 2023-11-23 NOTE — Plan of Care (Signed)
  Problem: Education: Goal: Knowledge of General Education information will improve Description: Including pain rating scale, medication(s)/side effects and non-pharmacologic comfort measures Outcome: Progressing   Problem: Pain Management: Goal: General experience of comfort will improve Outcome: Progressing

## 2023-11-25 NOTE — Progress Notes (Deleted)
HPI M never smoker followed for OSA, complicated by GERD NPSG 04/15/13- moderate obstructive sleep apnea-AHI 22.5 per hour. CPAP 10 CWP. Weight 198 pounds.   ---------------------------------------------------------------------------------  07/13/21- 72 year old male never smoker followed for OSA, insomnia, complicated by GERD, CKD4/ ESRD, Allergic Rhinitis, DM2,  -Gabapentin, Trazodone,       CPAP auto 8-20/ [employer]   Download- not available Body weight today-209 lbs Covid vax-  Ok with CPAP. Machine is old and we discussed replacement.  Back pain  due to disc disease treated with injection. On renal transplant waiting list.  CXR 09/03/20- IMPRESSION: No acute abnormality of the lungs  11/26/23- 72 year old male never smoker followed for OSA, insomnia, complicated by GERD, CKD4/ ESRD, Allergic Rhinitis, DM2,  -Gabapentin, Trazodone,       CPAP auto 8-20/ [employer]   Download-  Body weight today- Recent hosp for CKD5, kidney stone, renal cysts, HTN  CXR 10/03/23 IMPRESSION: No acute cardiopulmonary abnormality.    ROS-see HPI + = positive Constitutional:   No-   weight loss, night sweats, fevers, chills, +fatigue, lassitude. HEENT:   No-  headaches, difficulty swallowing, tooth/dental problems, sore throat,       No-  sneezing, itching, ear ache, nasal congestion, +post nasal drip,  CV:  No-   chest pain, orthopnea, PND, swelling in lower extremities, anasarca, dizziness, palpitations Resp: No-   shortness of breath with exertion or at rest.              No-   productive cough,  No non-productive cough,  No- coughing up of blood.              No-   change in color of mucus.  No- wheezing.   Skin: No-   rash or lesions. GI:  No-   heartburn, indigestion, abdominal pain, nausea, vomiting,  GU:  MS:  No-   joint pain or swelling.  +back pain Neuro-     nothing unusual Psych:  No- change in mood or affect. No depression or anxiety.  No memory loss.  OBJ- Physical Exam General-  Alert, Oriented, Affect-appropriate, Distress- none acute,  Skin- rash-none, lesions- none, excoriation- none Lymphadenopathy- none Head- atraumatic            Eyes- Gross vision intact, PERRLA, conjunctivae and secretions clear            Ears- Hearing, canals-normal            Nose-  sniffing,  turbinate edema, no-Septal dev, mucus, polyps, erosion, perforation             Throat- Mallampati III-IV , mucosa clear , drainage- none, tonsils- atrophic Neck- flexible , trachea midline, no stridor , thyroid nl, carotid no bruit Chest - symmetrical excursion , unlabored           Heart/CV- RRR , no murmur heard , no gallop  , no rub, nl s1 s2                           - JVD- none , edema- none, stasis changes- none, varices- none           Lung- clear to P&A, wheeze- none, cough- none , dullness-none, rub- none           Chest wall-  Abd-  Br/ Gen/ Rectal- Not done, not indicated Extrem- cyanosis- none, clubbing, none, atrophy- none, strength- nl Neuro- grossly intact to observation

## 2023-11-26 ENCOUNTER — Telehealth: Payer: Self-pay | Admitting: *Deleted

## 2023-11-26 ENCOUNTER — Ambulatory Visit: Payer: Medicare Other | Admitting: Internal Medicine

## 2023-11-26 HISTORY — PX: KIDNEY TRANSPLANT: SHX239

## 2023-11-26 NOTE — Transitions of Care (Post Inpatient/ED Visit) (Signed)
   11/26/2023  Name: Bruce Weber. MRN: [id number] DOB: Jan 30, 1951  Today's TOC FU Call Status: Today's TOC FU Call Status:: Successful TOC FU Call Completed TOC FU Call Complete Date: 11/26/23 Patient's Name and Date of Birth confirmed.  Transition Care Management Follow-up Telephone Call Date of Discharge: 11/23/23 [facility] Type of Discharge: Inpatient Admission Primary Inpatient Discharge Diagnosis:: acute kidney injury How have you been since you were released from the hospital?: Worse (patient is on his way to [facility] for surgery/ had not passed kidney stone) Any questions or concerns?: No  Items Reviewed: Did you receive and understand the discharge instructions provided?: Yes Medications obtained,verified, and reconciled?: No Medications Not Reviewed Reasons:: Other: (Patient on his way to surgery) Any new allergies since your discharge?: No Dietary orders reviewed?: No Do you have support at home?: Yes People in Home: spouse Name of Support/Comfort Primary Source: Bruce Weber  Medications Reviewed Today: Medications Reviewed Today   Medications were not reviewed in this encounter     Home Care and Equipment/Supplies: Were Home Health Services Ordered?: No Any new equipment or medical supplies ordered?: No  Functional Questionnaire: Do you need assistance with bathing/showering or dressing?: Yes Do you need assistance with meal preparation?: No Do you need assistance with eating?: No Do you have difficulty maintaining continence: No Do you need assistance with getting out of bed/getting out of a chair/moving?: No Do you have difficulty managing or taking your medications?: No  Follow up appointments reviewed: PCP Follow-up appointment confirmed?: No MD Provider Line Number:[phone] Given: Yes Specialist Hospital Follow-up appointment confirmed?: Yes Date of Specialist follow-up appointment?: 11/26/23 Follow-Up Specialty Provider::  [facility]/ Do you need transportation to your follow-up appointment?: No Do you understand care options if your condition(s) worsen?: Yes-patient verbalized understanding  Patient has not passed the kidney stone. He is on his way to [facility] for surgery.  Bruce Weber BSN RN Population Health- Transition of Care Team.  [facility] [phone]

## 2023-12-03 ENCOUNTER — Telehealth: Payer: Self-pay

## 2023-12-03 NOTE — Transitions of Care (Post Inpatient/ED Visit) (Signed)
12/03/2023  Name: Bruce Weber. MRN: [id number] DOB: 08-01-1951  Today's TOC FU Call Status: Today's TOC FU Call Status:: Successful TOC FU Call Completed TOC FU Call Complete Date: 12/03/23 Patient's Name and Date of Birth confirmed.  Transition Care Management Follow-up Telephone Call Date of Discharge: 11/30/23 Discharge Facility: Other [facility]) Name of Other (Non[facility]) Discharge Facility: [facility] Type of Discharge: Inpatient Admission Primary Inpatient Discharge Diagnosis:: Kidney Transplant How have you been since you were released from the hospital?: Better (Patient notes he feels great, no pain.) Any questions or concerns?: No  Items Reviewed: Did you receive and understand the discharge instructions provided?: Yes Medications obtained,verified, and reconciled?: Yes (Medications Reviewed) Any new allergies since your discharge?: No Dietary orders reviewed?: No Do you have support at home?: Yes People in Home: spouse Name of Support/Comfort Primary Source: [name]  Medications Reviewed Today: Medications Reviewed Today     Reviewed by Jodelle Gross, RN (Case Manager) on 12/03/23 at 1411  Med List Status: <None>   Medication Order Taking? Sig Documenting Provider Last Dose Status Informant  acetaminophen (TYLENOL) 325 MG tablet [id number] Yes Take 2 tablets (650 mg total) by mouth every 6 (six) hours as needed for moderate pain. Wallis Bamberg, PA-C Taking Active Self  allopurinol (ZYLOPRIM) 100 MG tablet [id number] Yes Take 50 mg twice per week Tat, Onalee Hua, MD Taking Active   amLODipine (NORVASC) 10 MG tablet [id number] Yes TAKE 1 TABLET BY MOUTH EVERYDAY AT BEDTIME Deeann Saint, MD Taking Active Self  aspirin EC 81 MG tablet [id number] Yes Take 1 tablet by mouth daily. [provider] Taking Active   atorvastatin (LIPITOR) 10 MG tablet [id number] Yes TAKE 1 TABLET BY MOUTH EVERY DAY (NEED PHYSICAL) Deeann Saint, MD Taking  Active Self  calcitRIOL (ROCALTROL) 0.25 MCG capsule [id number] Yes Take 0.25 mcg by mouth at bedtime.  [provider] Taking Active Self  famotidine (PEPCID) 40 MG tablet [id number] Yes TAKE 1 TABLET (40 MG TOTAL) BY MOUTH DAILY. WITH BREAKFAST Lanelle Bal, DO Taking Active Self           Med Note Joanne Gavel, CRYSTAL L   Wed Jun 27, 2023 11:24 AM) Takes five days per week (mornings)  fluconazole (DIFLUCAN) 50 MG tablet [id number] Yes Take 1 tablet by mouth daily. [provider] Taking Active   fluticasone (FLONASE) 50 MCG/ACT nasal spray [id number]  Place 1 spray into both nostrils daily for 14 days.  Patient taking differently: Place 1 spray into both nostrils at bedtime as needed for allergies.   Durward Parcel, FNP  Expired 11/20/23 2359 Self  furosemide (LASIX) 40 MG tablet [id number] Yes Take 40 mg by mouth daily. [provider] Taking Active Self  gabapentin (NEURONTIN) 100 MG capsule [id number] Yes TAKE 1 CAPSULE BY MOUTH THREE TIMES A Terisa Starr, MD Taking Active Self  hydrALAZINE (APRESOLINE) 25 MG tablet [id number] Yes TAKE 1 (ONE) TABLET THREE TIMES DAILY Deeann Saint, MD Taking Active Self  iron polysaccharides (NIFEREX) 150 MG capsule [id number] Yes Take 1 capsule by mouth daily. [provider] Taking Active   mycophenolate (MYFORTIC) 180 MG EC tablet [id number] Yes Take 360 mg by mouth 2 (two) times daily. [provider] Taking Active   predniSONE (DELTASONE) 5 MG tablet [id number] Yes Take 20 mg by mouth daily. [provider] Taking Active   propranolol (INDERAL) 80 MG tablet [id number] Yes TAKE 1 TABLET BY MOUTH TWICE A DAY Deeann Saint,  MD Taking Active Self  rosuvastatin (CRESTOR) 5 MG tablet [id number] Yes Take 5 mg by mouth daily. [provider] Taking Active Self  sodium bicarbonate 650 MG tablet [id number] Yes Take 1 tablet (650 mg total) by mouth 3 (three) times daily. Catarina Hartshorn, MD Taking Active    sodium zirconium cyclosilicate (LOKELMA) 10 g PACK packet [id number] Yes Take 10 g by mouth daily. Catarina Hartshorn, MD Taking Active   sulfamethoxazole-trimethoprim (BACTRIM) 400-80 MG tablet [id number] Yes Take 1 tablet by mouth 3 (three) times a week. [provider] Taking Active   tacrolimus (PROGRAF) 1 MG capsule [id number] Yes Take 5 mg by mouth 2 (two) times daily. [provider] Taking Active   tamsulosin (FLOMAX) 0.4 MG CAPS capsule [id number] Yes Take 2 capsules (0.8 mg total) by mouth daily. Catarina Hartshorn, MD Taking Active   traMADol Janean Sark) 50 MG tablet [id number] Yes Take 1 tablet (50 mg total) by mouth 3 (three) times daily as needed. Particia Nearing, [place] Taking Active Self  traZODone (DESYREL) 50 MG tablet [id number] Yes TAKE 1 TABLET BY MOUTH EVERYDAY AT BEDTIME  Patient taking differently: 50 mg.   Waymon Budge, MD Taking Active Self           Med Note Joanne Gavel, CRYSTAL L   Wed Aug 08, 2023  9:52 AM) As needed.  valGANciclovir (VALCYTE) 450 MG tablet [id number] Yes Take 450 mg by mouth every Monday, Wednesday, and Friday. [provider] Taking Active             Home Care and Equipment/Supplies: Were Home Health Services Ordered?: No Any new equipment or medical supplies ordered?: No  Functional Questionnaire: Do you need assistance with bathing/showering or dressing?: Yes Do you need assistance with meal preparation?: No Do you need assistance with eating?: No Do you have difficulty maintaining continence: No Do you need assistance with getting out of bed/getting out of a chair/moving?: No Do you have difficulty managing or taking your medications?: No  Follow up appointments reviewed: PCP Follow-up appointment confirmed?: No (Patient states he has a lot of follow up appointments and will call for appt with PCP in a few weeks) MD Provider Line [phone] Given: No Specialist Hospital Follow-up appointment confirmed?: Yes Date of  Specialist follow-up appointment?: 12/06/23 Follow-Up Specialty Provider:: [facility] Do you need transportation to your follow-up appointment?: No Do you understand care options if your condition(s) worsen?: Yes-patient verbalized understanding  SDOH Interventions Today    Flowsheet Row Most Recent Value  SDOH Interventions   Food Insecurity Interventions Intervention Not Indicated  Housing Interventions Intervention Not Indicated  Transportation Interventions Intervention Not Indicated  Utilities Interventions Intervention Not Indicated     Jodelle Gross RN, BSN, CCM RN Care Manager  Transitions of Care  [facility] - Population Health  [phone]

## 2023-12-09 ENCOUNTER — Other Ambulatory Visit: Payer: Self-pay | Admitting: Orthopaedic Surgery

## 2023-12-09 ENCOUNTER — Other Ambulatory Visit: Payer: Self-pay | Admitting: Family Medicine

## 2023-12-09 DIAGNOSIS — M545 Low back pain, unspecified: Secondary | ICD-10-CM

## 2023-12-10 ENCOUNTER — Telehealth: Payer: Self-pay | Admitting: Orthopedic Surgery

## 2023-12-10 ENCOUNTER — Other Ambulatory Visit: Payer: Self-pay | Admitting: Orthopedic Surgery

## 2023-12-10 DIAGNOSIS — M545 Low back pain, unspecified: Secondary | ICD-10-CM

## 2023-12-10 MED ORDER — GABAPENTIN 100 MG PO CAPS: ORAL_CAPSULE | ORAL | 2 refills | Status: DC

## 2023-12-10 NOTE — Telephone Encounter (Signed)
 Someone lvm for the patient wanting to know why his Gabapentin  wasn't renewed.  I see where Sari sent a message to Dr. Brenna and Dr. Brenna stated:  I have not seen since April 2024.  I gave Rx then as his doctor was out of town.  I normally do not Rx this medicine. I called the patient and explained this, he said he normally didn't see Dr. Brenna.  He said he just had a kidney transplant on 11/26/23 as well.  He requested a send a message and see if Dr. Margrette would fill this.  [phone]

## 2023-12-10 NOTE — Progress Notes (Signed)
 Requested Prescriptions   Signed Prescriptions Disp Refills   gabapentin (NEURONTIN) 100 MG capsule 90 capsule 2    Sig: TAKE 1 CAPSULE BY MOUTH THREE TIMES A DAY

## 2023-12-24 ENCOUNTER — Telehealth: Payer: Self-pay | Admitting: Family Medicine

## 2023-12-24 NOTE — Telephone Encounter (Signed)
Call and left a VM, information has been faxed to Dr. Maple Hudson, that is the Doctor that would fill out the forms or give a VO.

## 2023-12-24 NOTE — Telephone Encounter (Signed)
Copied from CRM [id number]. Topic: Clinical - Home Health Verbal Orders >> Dec 24, 2023  8:30 AM Adele Barthel wrote: Caller/Agency: Social research officer, government.  Callback Number: [phone] ext [phone] Service Requested: has faxed certificate of medical necessity form regarding CPAP supplies on 12/13/2023, has not received response. Requests call back.  Frequency: N/a Any new concerns about the patient? No

## 2024-01-01 ENCOUNTER — Other Ambulatory Visit: Payer: Self-pay | Admitting: Family Medicine

## 2024-01-05 ENCOUNTER — Other Ambulatory Visit: Payer: Self-pay | Admitting: Family Medicine

## 2024-02-17 ENCOUNTER — Other Ambulatory Visit: Payer: Self-pay | Admitting: Internal Medicine

## 2024-02-17 DIAGNOSIS — K219 Gastro-esophageal reflux disease without esophagitis: Secondary | ICD-10-CM

## 2024-02-18 ENCOUNTER — Other Ambulatory Visit: Payer: Self-pay | Admitting: Orthopedic Surgery

## 2024-02-18 ENCOUNTER — Other Ambulatory Visit: Payer: Self-pay | Admitting: Family Medicine

## 2024-02-18 DIAGNOSIS — M545 Low back pain, unspecified: Secondary | ICD-10-CM

## 2024-02-18 DIAGNOSIS — I10 Essential (primary) hypertension: Secondary | ICD-10-CM

## 2024-02-23 NOTE — Progress Notes (Unsigned)
 HPI M never smoker followed for OSA, complicated by GERD NPSG 04/15/13- moderate obstructive sleep apnea-AHI 22.5 per hour. CPAP 10 CWP. Weight 198 pounds.   ---------------------------------------------------------------------------------   07/13/21- 73 year old male never smoker followed for OSA, insomnia, complicated by GERD, CKD4/ ESRD, Allergic Rhinitis, DM2,  -Gabapentin, Trazodone,       CPAP auto 8-20/ [facility]   Download- not available Body weight today-209 lbs Covid vax-  Ok with CPAP. Machine is old and we discussed replacement.  Back pain  due to disc disease treated with injection. On renal transplant waiting list.  CXR 09/03/20- IMPRESSION: No acute abnormality of the lungs  02/25/24-  73 year old male never smoker followed for OSA, insomnia, complicated by GERD, CKD4/ ESRD/ Renal Transplant, Allergic Rhinitis, DM2,  -Gabapentin, Trazodone,       CPAP auto 8-20/ [facility]  AirSense 11 replaced 2022 Download- 21%. AHI 3.4/hr Body weight today-193 lbs -----Kidney transplant 11/26/2023.  Not using CPAP due to too much air blowing in face.  Last used around 01/03/2024 Discussed the use of AI scribe software for clinical note transcription with the patient, who gave verbal consent to proceed. History of Present Illness   The patient, with a history of kidney transplant and sleep apnea, presents for a follow-up visit. He reports overall good health post-transplant, with no complications. However, he has gained about fourteen pounds, which he attributes to prednisone. He expresses a desire to lose weight, aiming to return to the 170s. Wife is here.  Regarding his sleep apnea, he reports that his CPAP machine feels like it's blowing too hard, despite it being only a year old. He is open to adjusting the pressure to make it more comfortable. He also notes changes in his breathing, feeling more aware of his exhalation, but denies any wheezing or coughing. Despite this, he is able to walk a  couple of miles without getting winded.  He also mentions feeling a little dizzy when he puts his head down, which he attributes to blood pressure changes. He was taken off a beta blocker due to a low heart rate and is now on half a pill of amlodipine, which seems to be keeping his blood pressure steady. He also reports some improvement in physical condition, but acknowledges he is not yet in optimal shape.     ROS-see HPI + = positive Constitutional:   No-   weight loss, night sweats, fevers, chills, +fatigue, lassitude. HEENT:   No-  headaches, difficulty swallowing, tooth/dental problems, sore throat,       No-  sneezing, itching, ear ache, nasal congestion, +post nasal drip,  CV:  No-   chest pain, orthopnea, PND, swelling in lower extremities, anasarca, dizziness, palpitations Resp: No-   shortness of breath with exertion or at rest.              No-   productive cough,  No non-productive cough,  No- coughing up of blood.              No-   change in color of mucus.  No- wheezing.   Skin: No-   rash or lesions. GI:  No-   heartburn, indigestion, abdominal pain, nausea, vomiting,  GU:  MS:  No-   joint pain or swelling.  +back pain Neuro-     nothing unusual Psych:  No- change in mood or affect. No depression or anxiety.  No memory loss.  OBJ- Physical Exam General- Alert, Oriented, Affect-appropriate, Distress- none acute,  Skin- rash-none, lesions- none,  excoriation- none Lymphadenopathy- none Head- atraumatic            Eyes- Gross vision intact, PERRLA, conjunctivae and secretions clear            Ears- Hearing, canals-normal            Nose-  sniffing,  turbinate edema, no-Septal dev, mucus, polyps, erosion, perforation             Throat- Mallampati III-IV , mucosa clear , drainage- none, tonsils- atrophic Neck- flexible , trachea midline, no stridor , thyroid nl, carotid no bruit Chest - symmetrical excursion , unlabored           Heart/CV- RRR , no murmur heard , no gallop   , no rub, nl s1 s2                           - JVD- none , edema- none, stasis changes- none, varices- none           Lung- clear to P&A, wheeze- none, cough- none , dullness-none, rub- none           Chest wall-  Abd-  Br/ Gen/ Rectal- Not done, not indicated Extrem- cyanosis- none, clubbing, none, atrophy- none, strength- nl Neuro- grossly intact to observation Assessment and Plan    Obstructive Sleep Apnea CPAP pressure feels excessive, likely due to weight changes affecting mask fit. - Adjust CPAP pressure range to 8-15 cm H2O. - Coordinate with home care company for CPAP setting adjustments.  Insomnia Difficulty sleeping, possibly due to prednisone and past trazodone use. Advised against trazodone and unnecessary medications. Emphasized sleep hygiene. - Encourage good sleep hygiene practices.  Post-Kidney Transplant Status Stable post-transplant on prednisone, contributing to weight gain and sleep disturbances.  Hypertension Dizziness from blood pressure changes. Discontinued beta blocker, started amlodipine, stabilizing blood pressure. Advised caution with position changes. - Continue amlodipine as prescribed. - Advise to sit on the edge of the bed before standing.  Deconditioning Improving physical condition with regular walking, increasing stamina and health. - Encourage continued walking and physical activity as tolerated.

## 2024-02-25 ENCOUNTER — Encounter: Payer: Self-pay | Admitting: Internal Medicine

## 2024-02-25 ENCOUNTER — Ambulatory Visit: Payer: Medicare Other | Admitting: Internal Medicine

## 2024-02-25 VITALS — BP 138/82 | HR 82 | Temp 97.8°F | Ht 69.0 in | Wt 193.0 lb

## 2024-02-25 DIAGNOSIS — G4733 Obstructive sleep apnea (adult) (pediatric): Secondary | ICD-10-CM

## 2024-02-25 NOTE — Patient Instructions (Signed)
 Order- DME [facility]- please change autopap range to 8-15, continue mask of choice, humidifier, supplies, AirView card  Please call if we can help

## 2024-02-29 ENCOUNTER — Encounter: Payer: Self-pay | Admitting: Internal Medicine

## 2024-05-25 NOTE — Progress Notes (Unsigned)
 HPI M never smoker followed for OSA, complicated by GERD NPSG 04/15/13- moderate obstructive sleep apnea-AHI 22.5 per hour. CPAP 10 CWP. Weight 198 pounds.   ---------------------------------------------------------------------------------   07/13/21- 73 year old male never smoker followed for OSA, insomnia, complicated by GERD, CKD4/ ESRD, Allergic Rhinitis, DM2,  -Gabapentin , Trazodone ,       CPAP auto 8-20/ [facility]   Download- not available Body weight today-209 lbs Covid vax-  Ok with CPAP. Machine is old and we discussed replacement.  Back pain  due to disc disease treated with injection. On renal transplant waiting list.  CXR 09/03/20- IMPRESSION: No acute abnormality of the lungs  02/25/24-  73 year old male never smoker followed for OSA, insomnia, complicated by GERD, CKD4/ ESRD/ Renal Transplant 2024, Allergic Rhinitis, DM2,  -Gabapentin , Trazodone ,       CPAP auto 8-20/ [facility]  AirSense 11 replaced 2022 Download- 21%. AHI 3.4/hr Body weight today-193 lbs -----Kidney transplant 11/26/2023.  Not using CPAP due to too much air blowing in face.  Last used around 01/03/2024 Discussed the use of AI scribe software for clinical note transcription with the patient, who gave verbal consent to proceed. History of Present Illness   The patient, with a history of kidney transplant and sleep apnea, presents for a follow-up visit. He reports overall good health post-transplant, with no complications. However, he has gained about fourteen pounds, which he attributes to prednisone . He expresses a desire to lose weight, aiming to return to the 170s. Wife is here.  Regarding his sleep apnea, he reports that his CPAP machine feels like it's blowing too hard, despite it being only a year old. He is open to adjusting the pressure to make it more comfortable. He also notes changes in his breathing, feeling more aware of his exhalation, but denies any wheezing or coughing. Despite this, he is able to walk a  couple of miles without getting winded.  He also mentions feeling a little dizzy when he puts his head down, which he attributes to blood pressure changes. He was taken off a beta blocker due to a low heart rate and is now on half a pill of amlodipine , which seems to be keeping his blood pressure steady. He also reports some improvement in physical condition, but acknowledges he is not yet in optimal shape.   Assessment and Plan:    Obstructive Sleep Apnea CPAP pressure feels excessive, likely due to weight changes affecting mask fit. - Adjust CPAP pressure range to 8-15 cm H2O. - Coordinate with home care company for CPAP setting adjustments.  Insomnia Difficulty sleeping, possibly due to prednisone  and past trazodone  use. Advised against trazodone  and unnecessary medications. Emphasized sleep hygiene. - Encourage good sleep hygiene practices.  Post-Kidney Transplant Status Stable post-transplant on prednisone , contributing to weight gain and sleep disturbances.  Hypertension Dizziness from blood pressure changes. Discontinued beta blocker, started amlodipine , stabilizing blood pressure. Advised caution with position changes. - Continue amlodipine  as prescribed. - Advise to sit on the edge of the bed before standing.  Deconditioning Improving physical condition with regular walking, increasing stamina and health. - Encourage continued walking and physical activity as tolerated.     05/26/24-  73 year old male never smoker followed for OSA, insomnia, complicated by GERD, CKD4/ ESRD/ Renal Transplant 2024, Allergic Rhinitis, DM2,  -Gabapentin , Trazodone ,       CPAP auto 8-20/ [facility]  AirSense 11 replaced 2022 Download-  Body weight today-     ROS-see HPI + = positive Constitutional:   No-   weight  loss, night sweats, fevers, chills, +fatigue, lassitude. HEENT:   No-  headaches, difficulty swallowing, tooth/dental problems, sore throat,       No-  sneezing, itching, ear ache, nasal  congestion, +post nasal drip,  CV:  No-   chest pain, orthopnea, PND, swelling in lower extremities, anasarca, dizziness, palpitations Resp: No-   shortness of breath with exertion or at rest.              No-   productive cough,  No non-productive cough,  No- coughing up of blood.              No-   change in color of mucus.  No- wheezing.   Skin: No-   rash or lesions. GI:  No-   heartburn, indigestion, abdominal pain, nausea, vomiting,  GU:  MS:  No-   joint pain or swelling.  +back pain Neuro-     nothing unusual Psych:  No- change in mood or affect. No depression or anxiety.  No memory loss.  OBJ- Physical Exam General- Alert, Oriented, Affect-appropriate, Distress- none acute,  Skin- rash-none, lesions- none, excoriation- none Lymphadenopathy- none Head- atraumatic            Eyes- Gross vision intact, PERRLA, conjunctivae and secretions clear            Ears- Hearing, canals-normal            Nose-  sniffing,  turbinate edema, no-Septal dev, mucus, polyps, erosion, perforation             Throat- Mallampati III-IV , mucosa clear , drainage- none, tonsils- atrophic Neck- flexible , trachea midline, no stridor , thyroid  nl, carotid no bruit Chest - symmetrical excursion , unlabored           Heart/CV- RRR , no murmur heard , no gallop  , no rub, nl s1 s2                           - JVD- none , edema- none, stasis changes- none, varices- none           Lung- clear to P&A, wheeze- none, cough- none , dullness-none, rub- none           Chest wall-  Abd-  Br/ Gen/ Rectal- Not done, not indicated Extrem- cyanosis- none, clubbing, none, atrophy- none, strength- nl Neuro- grossly intact to observation

## 2024-05-27 ENCOUNTER — Ambulatory Visit (INDEPENDENT_AMBULATORY_CARE_PROVIDER_SITE_OTHER): Admitting: Internal Medicine

## 2024-05-27 ENCOUNTER — Encounter: Payer: Self-pay | Admitting: Internal Medicine

## 2024-05-27 VITALS — BP 137/84 | HR 78 | Temp 97.6°F | Resp 18 | Ht 69.0 in | Wt 197.8 lb

## 2024-05-27 DIAGNOSIS — G4733 Obstructive sleep apnea (adult) (pediatric): Secondary | ICD-10-CM | POA: Diagnosis not present

## 2024-05-27 NOTE — Patient Instructions (Signed)
 Glad you are doing well with CPAP. We can continue auto 8-15  Please call if we can help

## 2024-06-29 ENCOUNTER — Other Ambulatory Visit: Payer: Self-pay | Admitting: Orthopedic Surgery

## 2024-06-29 DIAGNOSIS — M545 Low back pain, unspecified: Secondary | ICD-10-CM

## 2024-07-25 ENCOUNTER — Encounter: Payer: Self-pay | Admitting: Radiology

## 2024-07-29 ENCOUNTER — Other Ambulatory Visit: Payer: Self-pay | Admitting: Medical Genetics

## 2024-07-29 ENCOUNTER — Other Ambulatory Visit

## 2024-07-29 DIAGNOSIS — Z006 Encounter for examination for normal comparison and control in clinical research program: Secondary | ICD-10-CM

## 2024-07-29 LAB — HM DIABETES EYE EXAM

## 2024-07-29 NOTE — Progress Notes (Signed)
 gene

## 2024-08-17 ENCOUNTER — Other Ambulatory Visit: Payer: Self-pay | Admitting: Family Medicine

## 2024-08-22 LAB — GENECONNECT MOLECULAR SCREEN: Genetic Analysis Overall Interpretation: NEGATIVE

## 2024-08-29 NOTE — Progress Notes (Signed)
 Bruce Weber [name].                                          MRN: [id number]   08/29/2024   The [facility] Quality Team Specialist reviewed this patient medical record for the purposes of chart review for care gap closure. The following were reviewed: abstraction for care gap closure-glycemic status assessment.    [facility] Quality Team

## 2024-09-09 ENCOUNTER — Ambulatory Visit: Admitting: Family Medicine

## 2024-09-09 VITALS — BP 130/73

## 2024-09-09 DIAGNOSIS — Z0000 Encounter for general adult medical examination without abnormal findings: Secondary | ICD-10-CM | POA: Diagnosis not present

## 2024-09-09 NOTE — Progress Notes (Signed)
 PATIENT CHECK-IN and HEALTH RISK ASSESSMENT QUESTIONNAIRE:  -completed by phone/video for upcoming Medicare Preventive Visit  Pre-Visit Check-in: 1)Vitals (height, wt, BP, etc) - record in vitals section for visit on day of visit Request home vitals (wt, BP, etc.) and enter into vitals, THEN update Vital Signs SmartPhrase below at the top of the HPI. See below.  2)Review and Update Medications, Allergies PMH, Surgeries, Social history in Epic 3)Hospitalizations in the last year with date/reason?   Reports no, had rt last year 4)Review and Update Care Team (patient's specialists) in Epic 5) Complete PHQ9 in Epic  6) Complete Fall Screening in Epic 7)Review all Health Maintenance Due and order if not done.  Medicare Wellness Patient Questionnaire:  Answer theses question about your habits:n How often do you have a drink containing alcohol?na How many drinks containing alcohol do you have on a typical day when you are drinking?na How often do you have six or more drinks on one occasion?never Have you ever smoked?n Quit date if applicable? na  How many packs a day do/did you smoke? na Do you use smokeless tobacco?n Do you use an illicit drugs?n On average, how many days per week do you engage in moderate to strenuous exercise (like a brisk walk)?daily On average, how many minutes do you engage in exercise at this level?over 5 miles a day of walking usually, sometimes bike, 1.5 -2 hours Typical breakfast: boiled egg, apple, oranges, muffins or biscuits Typical lunch:  sandwich Typical dinner: eats out sometimes, shrimp fried rice or vegetable and fish and rice Typical snacks:pretzels or cookie  Beverages: lemonade, water , sometime ginger ale  Answer theses question about your everyday activities: Can you perform most household chores?y Are you deaf or have significant trouble hearing?n Do you feel that you have a problem with memory?n Do you feel safe at home?y Last dentist visit? Has  been almost a year - plans to go 8. Do you have any difficulty performing your everyday activities?n Are you having any difficulty walking, taking medications on your own, and or difficulty managing daily home needs?n Do you have difficulty walking or climbing stairs?n Do you have difficulty dressing or bathing?n Do you have difficulty doing errands alone such as visiting a doctor's office or shopping?n Do you currently have any difficulty preparing food and eating?n Do you currently have any difficulty using the toilet?n Do you have any difficulty managing your finances?n Do you have any difficulties with housekeeping of managing your housekeeping?n   Do you have Advanced Directives in place (Living Will, Healthcare Power or Attorney)? y   Last eye Exam and location?Had eye exam 1 month ago, reports saw Dr. Laurice in [place]   Do you currently use prescribed or non-prescribed narcotic or opioid pain medications?n  Do you have a history or close family history of breast, ovarian, tubal or peritoneal cancer or a family member with BRCA (breast cancer susceptibility 1 and 2) gene mutations?see pmh/fh     ----------------------------------------------------------------------------------------------------------------------------------------------------------------------------------------------------------------------  Because this visit was a virtual/telehealth visit, some criteria may be missing or patient reported. Any vitals not documented were not able to be obtained and vitals that have been documented are patient reported.    MEDICARE ANNUAL PREVENTIVE CARE VISIT WITH PROVIDER (Welcome to Medicare, initial annual wellness or annual wellness exam)  Virtual Visit via Video  Note  I connected with Alberta LITTIE [name]. on 09/09/24  by a video enabled telemedicine application and verified that I am speaking with the correct person using  two identifiers.  Location patient: home Location  provider:work or home office Persons participating in the virtual visit: patient, provider  Concerns and/or follow up today: he reports he is doing well. He see the specialists at [facility] every month.    See HM section in Epic for other details of completed HM.    ROS: negative for report of fevers, unintentional weight loss, vision changes, vision loss, hearing loss or change, chest pain, sob, hemoptysis, melena, hematochezia, hematuria, falls, bleeding or bruising, thoughts of suicide or self harm, memory loss  Patient-completed extensive health risk assessment - reviewed and discussed with the patient: See Health Risk Assessment completed with patient prior to the visit either above or in recent phone note. This was reviewed in detailed with the patient today and appropriate recommendations, orders and referrals were placed as needed per Summary below and patient instructions.   Review of Medical History: -PMH, PSH, Family History and current specialty and care providers reviewed and updated and listed below   Patient Care Team: Mercer Clotilda SAUNDERS, MD as PCP - General (Family Medicine) Deterding, Lynwood, MD as Consulting Physician (Nephrology) Roz Anes, MD as Consulting Physician (Ophthalmology) Neysa Reggy BIRCH, MD as Consulting Physician (Pulmonary Disease) Cindie Carlin POUR, DO (Internal Medicine)   Past Medical History:  Diagnosis Date   Adenoma 11/03/2009   1.1 cm from TCS   DM (diabetes mellitus) (HCC)    GERD (gastroesophageal reflux disease)    High cholesterol    History of kidney stones    HTN (hypertension)    Kidney failure    stage 4   Kidney stones    Obesity (BMI 30-39.9) DEC 2011 223 LBS   Rectal bleeding 12/04/2009   secondary to hemorrhoids    Past Surgical History:  Procedure Laterality Date   CIRCUMCISION     COLONOSCOPY  Dec 2010 [facility]   MOD IH, SIMPLE ADENOMA 1.1 CM   COLONOSCOPY  11/25/2012   SLF: 1. Sessile polyp measuring 6mm in size was  found at the hepatic flesure; polypectomy was performed using snare cautery 2. Moderate diverticulosis was noted in the ascending colon and sigmoid colon. 3. The colon mucosa was otherwise normal 4. Large internal hemorrhoids.    COLONOSCOPY N/A 03/01/2017   Procedure: COLONOSCOPY;  Surgeon: Margo LITTIE Haddock, MD;  [facility];  Service: Endoscopy;  Laterality: N/A;  10:30 AM   COLONOSCOPY WITH PROPOFOL  N/A 04/14/2022   Procedure: COLONOSCOPY WITH PROPOFOL ;  Surgeon: Cindie Carlin POUR, DO;  [facility];  Service: Endoscopy;  Laterality: N/A;  11:00am   EYE SURGERY  05/2018   bilateral cataract surgery   HERNIA REPAIR     POLYPECTOMY  04/14/2022   Procedure: POLYPECTOMY;  Surgeon: Cindie Carlin POUR, DO;  [facility];  Service: Endoscopy;;    Social History   Socioeconomic History   Marital status: Married    Spouse name: Not on file   Number of children: 0   Years of education: Not on file   Highest education level: Some college, no degree  Occupational History   Occupation: Surveyor, minerals for [employer]    Comment: retired   Occupation: Surveyor, minerals    Comment: full time from home   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 MARRIED TO 2ND WIFE.      02/06/2019: Lives with mother at this time assisting in her care, while wife lives nearby, taking care of her own parents.   Works from home full time, but planning on retiring in next month to focus on health/well-being   Used to work out on treadmill, but has not in recent months   Motivated to return to exercise regimine and better  diet control   Enjoys watching basketball, football      Social Drivers of Health   Financial Resource Strain: Low Risk  (10/03/2023)   Overall Financial Resource Strain (CARDIA)    Difficulty of Paying Living Expenses: Not very hard  Food Insecurity: Low Risk  (08/21/2024)   Received from [facility]   Hunger Vital Sign    Within the past 12 months, you worried that your food would run out before you got money to buy more: Never true    Within the past 12 months, the food you bought just didn't last and you didn't have money to get more. : Never true  Transportation Needs: No Transportation Needs (08/21/2024)   Received from [facility]    In the past 12 months, has lack of reliable transportation kept you from medical appointments, meetings, work or from getting things needed for daily living? : No  Physical Activity: Insufficiently Active (10/03/2023)   Exercise Vital Sign    Days of Exercise per Week: 1 day    Minutes of Exercise per Session: 10 min  Stress: Stress Concern Present (10/03/2023)   [employer] of Occupational Health - Occupational Stress Questionnaire    Feeling of Stress : To some extent  Social Connections: Socially Integrated (10/03/2023)   Social Connection and Isolation Panel    Frequency of Communication with Friends and Family: Once a week    Frequency of Social Gatherings with Friends and Family: More than three times a week    Attends Religious Services: More than 4 times per year    Active Member of [employer] or Organizations: Yes    Attends Engineer, structural: More than 4 times per year    Marital Status: Married  Catering manager Violence: Not At Risk (12/03/2023)   Humiliation, Afraid, Rape, and Kick questionnaire    Fear of Current or Ex-Partner: No    Emotionally Abused: No    Physically Abused: No    Sexually Abused: No    Family History  Problem Relation Age of Onset   Lung cancer Father    High blood  pressure Father    Colon cancer Neg Hx    Colon polyps Neg Hx     Current Outpatient Medications on File Prior to Visit  Medication Sig Dispense Refill   acetaminophen  (TYLENOL ) 325 MG tablet Take 2 tablets (650 mg total) by mouth every 6 (six) hours as needed for moderate pain. 30 tablet 0   amLODipine  (NORVASC ) 10 MG tablet TAKE 1 TABLET BY MOUTH EVERYDAY AT BEDTIME 90 tablet 0   aspirin EC 81 MG tablet Take 1 tablet by mouth daily.     atorvastatin (LIPITOR) 10 MG tablet TAKE 1 TABLET BY MOUTH EVERY DAY (NEED PHYSICAL) 90 tablet 1   famotidine  (PEPCID )  40 MG tablet TAKE 1 TABLET BY MOUTH IN THE MORNING WITH BREAKFAST 90 tablet 4   fluticasone  (FLONASE ) 50 MCG/ACT nasal spray Place 1 spray into both nostrils daily.     gabapentin  (NEURONTIN ) 100 MG capsule TAKE 1 CAPSULE BY MOUTH THREE TIMES A DAY 90 capsule 2   iron polysaccharides (NIFEREX) 150 MG capsule Take 150 mg by mouth daily.     mycophenolate (MYFORTIC) 180 MG EC tablet Take 360 mg by mouth 2 (two) times daily.     polyethylene glycol powder (GLYCOLAX /MIRALAX ) 17 GM/SCOOP powder Take by mouth once.     predniSONE  (DELTASONE ) 5 MG tablet Take 20 mg by mouth daily.     tacrolimus (PROGRAF) 1 MG capsule Take 5 mg by mouth 2 (two) times daily.     tamsulosin  (FLOMAX ) 0.4 MG CAPS capsule Take 2 capsules (0.8 mg total) by mouth daily. 60 capsule 1   valGANciclovir (VALCYTE) 450 MG tablet Take 450 mg by mouth every Monday, Wednesday, and Friday.     No current facility-administered medications on file prior to visit.    Allergies  Allergen Reactions   Albumin Human Anaphylaxis   Dye Fdc Blue [Brilliant Blue Fcf (Fd&C Blue #1)]     PT UNSURE WHICH DYE   Iodinated Contrast Media Hives    30 YEARS AGO, recent 2021 epidural with contrast ok       Physical Exam Vitals requested from patient and listed below if patient had equipment and was able to obtain at home for this virtual visit: Vitals:   09/09/24 1653  BP: 130/73    Estimated body mass index is 29.21 kg/m as calculated from the following:   Height as of 05/27/24: 5' 9 (1.753 m).   Weight as of 05/27/24: 197 lb 12.8 oz (89.7 kg).  EKG (optional): deferred due to virtual visit  GENERAL: alert, oriented, no acute distress detected; full vision exam deferred due to pandemic and/or virtual encounter   HEENT: atraumatic, conjunttiva clear, no obvious abnormalities on inspection of external nose and ears  NECK: normal movements of the head and neck  LUNGS: on inspection no signs of respiratory distress, breathing rate appears normal, no obvious gross SOB, gasping or wheezing  CV: no obvious cyanosis  MS: moves all visible extremities without noticeable abnormality  PSYCH/NEURO: pleasant and cooperative, no obvious depression or anxiety, speech and thought processing grossly intact, Cognitive function grossly intact  Flowsheet Row Office Visit from [facility]/17/20[facility] in [facility] [place]  PHQ-9 Total Score 6        09/09/2024    4:58 PM 10/17/2023   11:00 AM 10/03/2023    9:26 AM 08/14/2023    4:05 PM 08/09/2022    2:40 PM  Depression screen PHQ 2/9  Decreased Interest 0 1 1 0 0  Down, Depressed, Hopeless 0 0 0 0 0  PHQ - 2 Score 0 1 1 0 0  Altered sleeping  1 2  0  Tired, decreased energy  2 2  0  Change in appetite  1 2  0  Feeling bad or failure about yourself   0 1  0  Trouble concentrating  1 1  0  Moving slowly or fidgety/restless  0 1  0  Suicidal thoughts  0 0  0  PHQ-9 Score  6 10  0  Difficult doing work/chores  Somewhat difficult Somewhat difficult         08/14/2023    4:05 PM 10/03/2023  5:02 AM 10/03/2023    9:25 AM 10/17/2023   11:00 AM 09/09/2024    4:58 PM  Fall Risk  Falls in the past year? 0 1 1 0 0  Was there an injury with Fall? 0 0 0 0 0  Fall Risk Category Calculator 0 1  1 0 0  Patient at Risk for Falls Due to No Fall Risks;Other (Comment)  History of fall(s)    Fall risk Follow up  Falls evaluation completed  Falls evaluation completed Falls evaluation completed Falls evaluation completed     Patient-reported     SUMMARY AND PLAN:  Encounter for Medicare annual wellness exam  Discussed applicable health maintenance/preventive health measures and advised and referred or ordered per patient preferences: -discussed vaccines due, he plans to get at the pharmacy and agrees to provide proof of receipt when he does so that we can update his record -reports does labs at the transplant clinic -plans to get foot exam with his podiatrist - goes on a regular basis  Health Maintenance  Topic Date Due   Diabetic kidney evaluation - Urine ACR  Never done   DTaP/Tdap/Td (1 - Tdap) Never done   Zoster Vaccines- Shingrix (1 of 2) [birth year]/1968   FOOT EXAM  11/24/2022   Influenza Vaccine  07/04/2024   Hepatitis C Screening  10/16/2024 (Originally 07/03/1969)   Diabetic kidney evaluation - eGFR measurement  11/22/2024   HEMOGLOBIN A1C  02/18/2025   COVID-19 Vaccine (7 - Pfizer risk 2024-25 season) 02/25/2025   OPHTHALMOLOGY EXAM  07/29/2025   Medicare Annual Wellness (AWV)  09/09/2025   Colonoscopy  04/14/2032   Pneumococcal Vaccine: 50+ Years  Completed   Meningococcal B Vaccine  Aged [employer] and counseling on the following was provided based on the above review of health and a plan/checklist for the patient, along with additional information discussed, was provided for the patient in the patient instructions :   -Advised and counseled on a healthy lifestyle - including the importance of a healthy diet, regular physical activity, social connections -Reviewed patient's current diet. Advised and counseled on a whole foods based healthy diet. He wants to lose weight. Discussed eliminating/decreasing sweets/ ultraprocessed grains/foods and beverages. Healthy alternatives discussed.  A summary of a healthy diet was provided in the Patient Instructions.  -reviewed patient's  current physical activity level and discussed exercise guidelines for adults. Discussed community resources and ideas for safe exercise at home to assist in meeting exercise guideline recommendations in a safe and healthy way.  -Advise yearly dental visits at minimum and regular eye exams  Follow up: see patient instructions   Patient Instructions  I really enjoyed getting to talk with you today! I am available on Tuesdays and Thursdays for virtual visits if you have any questions or concerns, or if I can be of any further assistance.   CHECKLIST FROM ANNUAL WELLNESS VISIT:  -Follow up (please call to schedule if not scheduled after visit):   -yearly for annual wellness visit with primary care office  Here is a list of your preventive care/health maintenance measures and the plan for each if any are due:  PLAN For any measures below that may be due:     Can get vaccines at the pharmacy and then let please let us  know so that we can update your records.  Health Maintenance  Topic Date Due   Diabetic kidney evaluation - Urine ACR  Never done   DTaP/Tdap/Td (1 - Tdap)  Never done   Zoster Vaccines- Shingrix (1 of 2) [birth year]02/1968   FOOT EXAM  11/24/2022   HEMOGLOBIN A1C  04/15/2024   Influenza Vaccine  07/04/2024   Medicare Annual Wellness (AWV)  08/13/2024   Hepatitis C Screening  10/16/2024 (Originally 07/03/1969)   Diabetic kidney evaluation - eGFR measurement  11/22/2024   COVID-19 Vaccine (7 - Pfizer risk 2024-25 season) 02/25/2025   OPHTHALMOLOGY EXAM  07/29/2025   Colonoscopy  04/14/2032   Pneumococcal Vaccine: 50+ Years  Completed   Meningococcal B Vaccine  Aged Out    -See a dentist at least yearly  -Get your eyes checked and then per your eye specialist's recommendations  -Other issues addressed today:   -I have included below further information regarding a healthy whole foods based diet, physical activity guidelines for adults, stress management and opportunities for  social connections. I hope you find this information useful.   -----------------------------------------------------------------------------------------------------------------------------------------------------------------------------------------------------------------------------------------------------------    NUTRITION: -eat real food: lots of colorful vegetables (half the plate) and fruits -5-7 servings of vegetables and fruits per day (fresh or steamed is best), exp. 2 servings of vegetables with lunch and dinner and 2 servings of fruit per day. Berries and greens such as kale and collards are great choices.  -consume on a regular basis:  fresh fruits, fresh veggies, fish, nuts, seeds, healthy oils (such as olive oil, avocado oil), whole grains (make sure for bread/pasta/crackers/etc., that the first ingredient on label contains the word whole), legumes. -can eat small amounts of dairy and lean meat (no larger than the palm of your hand), but avoid processed meats such as ham, bacon, lunch meat, etc. -drink water  -try to avoid fast food and pre-packaged foods, processed meat, ultra processed foods/beverages (donuts, candy, etc.) -most experts advise limiting sodium to < 2300mg  per day, should limit further is any chronic conditions such as high blood pressure, heart disease, diabetes, etc. The American Heart Association advised that < 1500mg  is is ideal -try to avoid foods/beverages that contain any ingredients with names you do not recognize  -try to avoid foods/beverages  with added sugar or sweeteners/sweets  -try to avoid sweet drinks (including diet drinks): soda, juice, Gatorade, sweet tea, power drinks, diet drinks -try to avoid white rice, white bread, pasta (unless whole grain)  EXERCISE GUIDELINES FOR ADULTS: -if you wish to increase your physical activity, do so gradually and with the approval of your doctor -STOP and seek medical care immediately if you have any chest  pain, chest discomfort or trouble breathing when starting or increasing exercise  -move and stretch your body, legs, feet and arms when sitting for long periods -Physical activity guidelines for optimal health in adults: -get at least 150 minutes per week of moderate exercise (can talk, but not sing); this is about 20-30 minutes of sustained activity 5-7 days per week or two 10-15 minute episodes of sustained activity 5-7 days per week -do some muscle building/resistance training/strength training at least 2 days per week  -balance exercises 3+ days per week:   Stand somewhere where you have something sturdy to hold onto if you lose balance    1) lift up on toes, then back down, start with 5x per day and work up to 20x   2) stand and lift one leg straight out to the side so that foot is a few inches of the floor, start with 5x each side and work up to 20x each side   3) stand on one foot, start with 5 seconds  each side and work up to 20 seconds on each side  If you need ideas or help with getting more active:  -Silver sneakers https://tools.silversneakers.com  -Walk with a Doc: http://www.duncan-williams.com/  -try to include resistance (weight lifting/strength building) and balance exercises twice per week: or the following link for ideas: http://castillo-powell.com/  BuyDucts.dk  STRESS MANAGEMENT: -can try meditating, or just sitting quietly with deep breathing while intentionally relaxing all parts of your body for 5 minutes daily -if you need further help with stress, anxiety or depression please follow up with your primary doctor or contact the wonderful folks at [facility]: [phone]  SOCIAL CONNECTIONS: -options in [place] if you wish to engage in more social and exercise related activities:  -Silver sneakers https://tools.silversneakers.com  -Walk with a  Doc: http://www.duncan-williams.com/  -Check out the [facility] Active Adults 50+ section on the [place] of [employer] (hiking clubs, book clubs, cards and games, chess, exercise classes, aquatic classes and much more) - see the website for details: https://www.[place].gov/departments/parks-recreation/active-adults50  -YouTube has lots of exercise videos for different ages and abilities as well  -[facility] (a variety of indoor and outdoor inperson activities for adults). [phone]. [address]  -Virtual Online Classes (a variety of topics): see seniorplanet.org or call [phone]  -consider volunteering at a school, hospice center, church, senior center or elsewhere            Chiquita JONELLE Cramp, DO

## 2024-09-09 NOTE — Patient Instructions (Addendum)
 I really enjoyed getting to talk with you today! I am available on Tuesdays and Thursdays for virtual visits if you have any questions or concerns, or if I can be of any further assistance.   CHECKLIST FROM ANNUAL WELLNESS VISIT:  -Follow up (please call to schedule if not scheduled after visit):   -yearly for annual wellness visit with primary care office  Here is a list of your preventive care/health maintenance measures and the plan for each if any are due:  PLAN For any measures below that may be due:     Can get vaccines at the pharmacy and then let please let us  know so that we can update your records.  Health Maintenance  Topic Date Due   Diabetic kidney evaluation - Urine ACR  Never done   DTaP/Tdap/Td (1 - Tdap) Never done   Zoster Vaccines- Shingrix (1 of 2) 07/02/1970   FOOT EXAM  11/24/2022   HEMOGLOBIN A1C  04/15/2024   Influenza Vaccine  07/04/2024   Medicare Annual Wellness (AWV)  08/13/2024   Hepatitis C Screening  10/16/2024 (Originally 07/03/1969)   Diabetic kidney evaluation - eGFR measurement  11/22/2024   COVID-19 Vaccine (7 - Pfizer risk 2024-25 season) 02/25/2025   OPHTHALMOLOGY EXAM  07/29/2025   Colonoscopy  04/14/2032   Pneumococcal Vaccine: 50+ Years  Completed   Meningococcal B Vaccine  Aged Out    -See a dentist at least yearly  -Get your eyes checked and then per your eye specialist's recommendations  -Other issues addressed today:   -I have included below further information regarding a healthy whole foods based diet, physical activity guidelines for adults, stress management and opportunities for social connections. I hope you find this information useful.   -----------------------------------------------------------------------------------------------------------------------------------------------------------------------------------------------------------------------------------------------------------    NUTRITION: -eat real food: lots  of colorful vegetables (half the plate) and fruits -5-7 servings of vegetables and fruits per day (fresh or steamed is best), exp. 2 servings of vegetables with lunch and dinner and 2 servings of fruit per day. Berries and greens such as kale and collards are great choices.  -consume on a regular basis:  fresh fruits, fresh veggies, fish, nuts, seeds, healthy oils (such as olive oil, avocado oil), whole grains (make sure for bread/pasta/crackers/etc., that the first ingredient on label contains the word whole), legumes. -can eat small amounts of dairy and lean meat (no larger than the palm of your hand), but avoid processed meats such as ham, bacon, lunch meat, etc. -drink water  -try to avoid fast food and pre-packaged foods, processed meat, ultra processed foods/beverages (donuts, candy, etc.) -most experts advise limiting sodium to < 2300mg  per day, should limit further is any chronic conditions such as high blood pressure, heart disease, diabetes, etc. The American Heart Association advised that < 1500mg  is is ideal -try to avoid foods/beverages that contain any ingredients with names you do not recognize  -try to avoid foods/beverages  with added sugar or sweeteners/sweets  -try to avoid sweet drinks (including diet drinks): soda, juice, Gatorade, sweet tea, power drinks, diet drinks -try to avoid white rice, white bread, pasta (unless whole grain)  EXERCISE GUIDELINES FOR ADULTS: -if you wish to increase your physical activity, do so gradually and with the approval of your doctor -STOP and seek medical care immediately if you have any chest pain, chest discomfort or trouble breathing when starting or increasing exercise  -move and stretch your body, legs, feet and arms when sitting for long periods -Physical activity guidelines for optimal health in  adults: -get at least 150 minutes per week of moderate exercise (can talk, but not sing); this is about 20-30 minutes of sustained activity 5-7  days per week or two 10-15 minute episodes of sustained activity 5-7 days per week -do some muscle building/resistance training/strength training at least 2 days per week  -balance exercises 3+ days per week:   Stand somewhere where you have something sturdy to hold onto if you lose balance    1) lift up on toes, then back down, start with 5x per day and work up to 20x   2) stand and lift one leg straight out to the side so that foot is a few inches of the floor, start with 5x each side and work up to 20x each side   3) stand on one foot, start with 5 seconds each side and work up to 20 seconds on each side  If you need ideas or help with getting more active:  -Silver sneakers https://tools.silversneakers.com  -Walk with a Doc: http://www.duncan-[email or web address]/  -try to include resistance (weight lifting/strength building) and balance exercises twice per week: or the following link for ideas: http://castillo-powell.com/  BuyDucts.dk  STRESS MANAGEMENT: -can try meditating, or just sitting quietly with deep breathing while intentionally relaxing all parts of your body for 5 minutes daily -if you need further help with stress, anxiety or depression please follow up with your primary doctor or contact the wonderful folks at [facility]: [phone]  SOCIAL CONNECTIONS: -options in [place] if you wish to engage in more social and exercise related activities:  -Silver sneakers https://tools.silversneakers.com  -Walk with a Doc: http://www.duncan-[email or web address]/  -Check out the [facility] Active Adults 50+ section on the [place] [employer] (hiking clubs, book clubs, cards and games, chess, exercise classes, aquatic classes and much more) - see the website for details: https://www.[place].gov/departments/parks-recreation/active-adults50  -YouTube has lots of exercise videos for  different ages and abilities as well  -[facility] (a variety of indoor and outdoor inperson activities for adults). [phone]. [address]  -Virtual Online Classes (a variety of topics): see seniorplanet.org or call [phone]  -consider volunteering at a school, hospice center, church, senior center or elsewhere

## 2024-09-23 ENCOUNTER — Other Ambulatory Visit: Payer: Self-pay | Admitting: Orthopedic Surgery

## 2024-09-23 DIAGNOSIS — M545 Low back pain, unspecified: Secondary | ICD-10-CM

## 2024-10-06 ENCOUNTER — Encounter: Payer: Self-pay | Admitting: Radiology

## 2024-11-12 NOTE — Progress Notes (Signed)
 Transplant Clinic Follow up Visit Wed 11/12/2024  CC: S/P DDRT, immunosuppression management, hydroureteronephrosis, renal calculus  Patient summary: On 11/26/2023, he completed an DDRT to the right pelvis. PRA 66%. HLA MM 2-2-1. CMV D-/R-. Donor was a 66s y.o. WM, BMI 43, KDPI 90%. COD: COPD Exacerbation/Respiratory Distress. Campath induction. Had SGF.    Post op CT stone study on 11/27/2023 with 3 mm calculus in the mid right ureter with mild hydroureteronephrosis and similar 8 mmg right renal calculus. Urology consulted and recommended no intervention at this time. They recommended outpatient follow up with KUB and plan for possible extracorporeal shock wave lithotripsy vs ureteroscopy at time of his stent removal. He was continued on 0.8 mg of flomax  at time of discharge.   On 12/19/2023: Underwent stent removal. KUB prior to cystoscopy with no evidence of radiopaque in right kidney transplant. Did demonstrate superimposition of a calcification of right lower renal transplant stent. Recommended oblique view to determine if super imposed or associated with ureteral stent.   XR abdomen 2 views 01/04/2024:  Gastrointestinal tract: Nonobstructive bowel gas pattern. No evidence of pneumoperitoneum. Moderate colonic stool burden. Calcifications: Redemonstrated pelvic phleboliths. Calculi over the right native renal shadow measuring up to 6 mm. Musculoskeletal: No acute bony abnormality. Redemonstrated sclerotic lesion involving the right ilium, likely bone island. Polyarticular degenerative changes. Lower chest: No acute process. Lines/Tubes/Surgical: Surgical clips project over the right lower quadrant. Interval removal of the right transplant ureteral stent.  IMPRESSION: Nonobstructive bowel gas pattern. No convincing radiographic evidence of radiopaque urolithiasis of the transplant kidney. Calculi overlying the right native kidney.   Clinic HPI Wed 11/12/2024: Bruce Weber. presents to  post-transplant clinic today for a follow up visit and immunosuppression management, s/p DDRT 11/26/2023 (Kidney). Patient is approaching 1 year post-transplant, referred back to [facility] 11/18. Appointment has not be scheduled. Overall, they are doing well and have no concerns today. The patient is drinking 64+ oz of fluids each day and making good amounts of urine. They deny any changes in urinary output, dysuria, hematuria. Occasional bubbles in urine. Appetite is good with no nausea or vomiting and pts weight is stable. Currently on PO Rybelsus for PTDM, most recent A1c 6.9%. Was prescribed increased dose of Rybelsus at 7 mg last visit, started this dose 12/5 and tolerating well. BG at home have been averaging 110-140 fasting per log review. Blood pressures are stable at home and are running around 120-130/70-80s on Amlodipine  5 mg daily. Denies fatigue, lightheadedness, dizziness, chest pain, shortness of breath, cough. They are compliant with their medications and have not missed any IS doses. Current IS: Prograf 4 mg BID, MPA 360 mg BID, and Prednisone  5 mg daily. Currently taking Bactrim for prophylaxis. Last dose of Prograf was 10:00 pm with labs this morning.   Health Maintenance: Nephrology: [facility]  PCP: Dr. Clotilda Weber  Dental: q 6 month cleanings  Dermatology: due      Recent skin cancer diagnosis?: n/a Colonoscopy: UTD - 11/11/2020, repeat in 5 years  Flu shot: UTD - 09/18/2024 PNA vaccine: UTD - 12/30/2022 COVID19 vaccine: UTD - 08/28/2024   ROS: A complete ROS was performed and negative unless otherwise mentioned in HPI.  Problem List[1]  Social History[2]  Surgical History[3]  Family History[4]  Allergies[5]  Medications Ordered Prior to Encounter[6]  Objective: Vitals:   11/12/24 1043 11/12/24 1044  BP: 157/82 123/72  BP Location: Left arm Left arm  Patient Position: Sitting Standing  Pulse: 82 90  Resp:  16   Temp: 98.1 F (36.7 C)   TempSrc:  Temporal   SpO2: 100%   Weight: 91.7 kg (202 lb 1.6 oz)    Body mass index is 29 kg/m. Wt Readings from Last 3 Encounters:  11/12/24 91.7 kg (202 lb 1.6 oz)  10/15/24 91.5 kg (201 lb 11.2 oz)  09/18/24 91.2 kg (201 lb)   Physical Exam:  General Appearance:  73 y.o. African American male, alert and oriented x 3. No acute distress. Presents with wife today. Sitting comfortably on exam table. Mask present  Head:  Normocephalic, atraumatic.  Eyes:  Conjunctivae clear. No scleral icterus. EOMi.  Neck: Neck supple, trachea midline. No adenopathy, no thyromegaly, no overt JVD, no masses. No carotid bruits. Mild tenderness to palpation over left lateral neck. No pain to palpation over cervical spine. Strength and sensation of upper extremities intact.   Throat:  Moist mucous membranes. No thrush or ulcers.  Chest/Lungs:  Respirations unlabored. Clear to auscultation bilaterally, no wheezes, rhonchi or rales.  Heart:  Regular rate and rhythm. No murmurs, clicks, gallops, or rubs.  Abdomen:  Soft, non-tender, non-distended abdomen. No hernia under incision     Extremities:  Full ROM. No cyanosis or edema.  Skin:  Good skin turgor. No rashes or lesions.    Neurologic:  No focal deficits. No tremor.   Psych:  Judgement and memory intact. Affect appropriate.   Nursing note and vitals reviewed.   Labs: Pending and will be reviewed later today. Will check FK506 levels for drug toxicity, cytopenias with immunosuppression, electrolyte management and following kidney transplant renal function for monitoring of rejection or other renal disorders or technical complications.  Assessment: Bruce Weber. is with stage V CKD due to diabetes and hypertension status post preemptive deceased donor kidney transplant on 11/26/23. Patient with stable renal function.   Plan: Renal: ESRD secondary to T2DM & HTN. Was not on dialysis at the time of transplant.    Post op CT stone study on 11/27/2023 with 3 mm  calculus in the mid right ureter with mild hydroureteronephrosis and similar 8 mmg right renal calculus. Urology consulted and recommended no intervention at this time. They recommended outpatient follow up with KUB and plan for possible extracorporeal shock wave lithotripsy vs ureteroscopy at time of his stent removal. He was continued on 0.8 mg of flomax  at time of discharge.  Per urology, if patient has clinical decompensation or concerns for urosepsis, patient may require right nephrostomy tube placement, otherwise will have outpatient follow up with urology as pt asymptomatic. Reviewed pt's [facility] results from 10/06/2024: Cr 1.11 Repeat CMP stable. Lab Results  Component Value Date   CREATININE 1.18 11/12/2024   CREATININE 1.26 10/15/2024   CREATININE 1.35 (H) 09/18/2024   CREATININE 1.11 08/21/2024   CREATININE 1.21 07/23/2024   Lab Results  Component Value Date   PROTCRE  10/15/2024     Comment:     Unable to calculate   Total Protein/Creatinine Ratio: > 3500 mg/g - Nephrotic Proteinuria     PROTCRE  09/18/2024     Comment:     Unable to calculate   Total Protein/Creatinine Ratio: > 3500 mg/g - Nephrotic Proteinuria     PROTCRE  08/21/2024     Comment:     Unable to calculate   Total Protein/Creatinine Ratio: > 3500 mg/g - Nephrotic Proteinuria     PROTCRE  07/23/2024     Comment:     Unable to calculate   Total  Protein/Creatinine Ratio: > 3500 mg/g - Nephrotic Proteinuria      Kidney US  12/19/2023:  IMPRESSION: Right lower quadrant renal transplant in situ with patent allograft vasculature. Small subincisional sterility indeterminate fluid collection Resistive indices upper limit of normal and slightly improved compared to previous ultrasound  Immunosuppression: Patient status post Campath induction with delayed introduction of Tacrolimus.  Current regimen: tacrolimus 4 mg BID; goal FK level: 5-7. FK level acceptable.  Mycophenolic acid 360 mg BID Prednisone   5 mg daily  Lab Results  Component Value Date/Time   TACROLIMUS 5.7 11/12/2024 10:36 AM   TACROLIMUS 5.2 10/15/2024 10:55 AM   TACROLIMUS 8.5 09/18/2024 11:41 AM   Volume status:  Euvolemic, weight stable. On oral GLP1-RA. Continue adequate PO hydration. Will continue to monitor.  Wt Readings from Last 3 Encounters:  11/12/24 91.7 kg (202 lb 1.6 oz)  10/15/24 91.5 kg (201 lb 11.2 oz)  09/18/24 91.2 kg (201 lb)   ID prophylaxis:  Plan for Fluconazole for the first month post-transplant (completed) Valcyte for 3 months for CMV prophylaxis as patient low risk for CMV (completed) Continue Bactrim for 12 months post transplant for PCP prophylaxis.  CMV IgG of donor negative Per op note 11/26/2023 Lab Results  Component Value Date   CMVIGG Negative 11/27/2023   PTDM: Patient w/ hx DM prior to txp. A1c trending up post transplant, most recently 6.9%. Rybelsus increased to 7 mg daily 10/16/2024. Encouraged low sugar/carb diet and continued exercise. Note, pt uninterested in injectables.  Lab Results  Component Value Date   HGBA1C 6.8 (H) 08/21/2024    HTN: improved control on Amlodipine  5 mg nightly. No changes today, patient to continue to monitor BP at home.   Blood Counts:  Lab Results  Component Value Date   WBC 10.30 10/15/2024   HGB 13.3 (L) 10/15/2024   HCT 39.5 (L) 10/15/2024   MCV 94.4 10/15/2024   PLT 184 10/15/2024    Anemia:  Assessed as stable. Continue to monitor. Last Hgb  Has IDA, on iron supplement. Repeat profile 08/21/2024 without IDA. PO iron d/c'd 09/19/2024.  Lab Results  Component Value Date   HGB 13.3 (L) 10/15/2024   BPH: continue flomax  from 0.4 mg daily. Enlarged prostate noted with stent removal. Lab Results  Component Value Date   PSA 0.83 01/31/2023   PSA 1.69 01/10/2022   PSA 1.70 01/26/2021   Hyperlipidemia: Continue Atorvastatin 10 mg daily. Will monitor post-transplant.  Lab Results  Component Value Date   CHOL 196 05/30/2024   TRIG  74 05/30/2024   HDL 60 05/30/2024   GERD: Continue famotidine  40 mg daily and TUMS prn. Consider PPI if sx persist/worsen. Patient can reestablish with his local gastroenterologist, states he will schedule appt with them in near future.   Electrolytes: Will check CMP, Mag, and Phos today. Continue to monitor and replace as needed. Hypomagnesemia: Increase in diet. Not on supplementation at this time but stable.  Hypophosphatemia: Continue to monitor and liberalize phosphorus in diet.  Lab Results  Component Value Date   NA 135 (L) 11/12/2024   K 4.2 11/12/2024   CL 102 11/12/2024   CO2 23 11/12/2024   Magnesium  Date Value Ref Range Status  11/12/2024 1.5 (L) 1.9 - 2.7 mg/dL Final   Lab Results  Component Value Date   CALCIUM  9.6 11/12/2024   PHOS 1.8 (L) 11/12/2024   Abnormal lesion of native kidney: MRI w/ and w/o contrast performed 05/10/2023 showed left lower polar Bosniak type II F  lesion measuring 1.6 cm and proteinaceous/hemorrhagic renal cysts. Stable per repeat MRI 10/15/2024.   Left sided neck pain: States pain has improved since Monday. Taking Tylenol  PRN with adequate control. Denies numbness, tingling. No tenderness to palpitation over cervical spine. Likely mild muscle strain. Pt denied imaging. Educated patient to seek medical attention if experiences increased pain, numbness/tingling in upper extremities.  HCM: Patient should remain up to date on routine healthcare maintenance including cancer screenings, dermatology visits, and vaccinations. UTD on PNA and tdap, high dose flu. Requesting dental ppx: take 4 capsules (2,000mg  total) of Amoxicillin  by mouth once for 1 dose 30-60 minutes prior to dental visit/procedure. Rx sent today.   Follow up: RTC in four weeks. Referral back process initiated to Dr. Rayburn at [facility] in [place] 11/18. Encouraged patient to call office to check on status. Able to cancel f/up with us  in one month if appointment is scheduled.    Depending on patient preference, staffing availability and clinic volume on scheduled clinic day, the patient may be changed to an RCE (return care extender), Nurse Visit, or Lab only visit if deemed clinically stable.   Electronically signed by: Rocky Lum Ada, PA-C 11/12/2024 10:23 AM Electronically signed by: Rocky Lum Ada, PA-C 11/13/2024 4:12 PM  I have personally spent 45 minutes involved in face-to-face and non-face-to-face activities for this patient on the day of the visit.  Professional time spent includes the following activities, in addition to those noted in the documentation: chart review, lab review, imaging review, discussion with other members of care team, placing orders and coordinating care, patient education and counseling , medication counseling/reconciliation, and updating records in EMR.       [1] Patient Active Problem List Diagnosis   Insomnia   Obstructive sleep apnea   Low back pain   Displacement of lumbar intervertebral disc without myelopathy   Chronic kidney disease   Primary hypertension   Pre-diabetes   Other hyperlipidemia   History of kidney stones   OSA on CPAP   Chronic rhinitis   Gastroesophageal reflux disease without esophagitis   Benign prostatic hyperplasia with urinary hesitancy   Obesity (BMI 30.0-34.9)   History of hernia repair   Kidney replaced by transplant (CMD)   Immunocompromised (CMD)   Nephrolithiasis  [2] Social History Socioeconomic History   Marital status: Married  Tobacco Use   Smoking status: Never    Passive exposure: Never   Smokeless tobacco: Never  Substance and Sexual Activity   Alcohol use: Not Currently   Drug use: Never   Social Drivers of Health   Living Situation: Low Risk (09/18/2024)   Living Situation    What is your living situation today?: I have a steady place to live    Think about the place you live. Do you have problems with any of the following? Choose  all that apply:: None/None on this list  Food Insecurity: Low Risk (09/18/2024)   Food vital sign    Within the past 12 months, you worried that your food would run out before you got money to buy more: Never true    Within the past 12 months, the food you bought just didn't last and you didn't have money to get more: Never true  Transportation Needs: No Transportation Needs (09/18/2024)   Transportation    In the past 12 months, has lack of reliable transportation kept you from medical appointments, meetings, work or from getting things needed for daily living? : No  Utilities: Low Risk (  09/18/2024)   Utilities    In the past 12 months has the electric, gas, oil, or water  company threatened to shut off services in your home? : No  Safety: Low Risk (09/18/2024)   Safety    How often does anyone, including family and friends, physically hurt you?: Never    How often does anyone, including family and friends, insult or talk down to you?: Never    How often does anyone, including family and friends, threaten you with harm?: Never    How often does anyone, including family and friends, scream or curse at you?: Never  Tobacco Use: Low Risk (09/18/2024)   Patient History    Smoking Tobacco Use: Never    Smokeless Tobacco Use: Never    Passive Exposure: Never  Depression: Not At Risk (09/18/2024)   PHQ-2    PHQ-2 Score: 1  Social Connections: Socially Integrated (10/03/2023)   Received from [facility]   Social Connection and Isolation Panel    In a typical week, how many times do you talk on the phone with family, friends, or neighbors?: Once a week    How often do you get together with friends or relatives?: More than three times a week    How often do you attend church or religious services?: More than 4 times per year    Do you belong to any clubs or organizations such as church groups, unions, fraternal or athletic groups, or school groups?: Yes    How often do you  attend meetings of the clubs or organizations you belong to?: More than 4 times per year    Are you married, widowed, divorced, separated, never married, or living with a partner?: Married  Physicist, Medical Strain: Low Risk  (10/03/2023)   Received from [facility]   Overall Financial Resource Strain (CARDIA)    Difficulty of Paying Living Expenses: Not very hard  [3] Past Surgical History: Procedure Laterality Date   CATARACT EXTRACTION W/  INTRAOCULAR LENS IMPLANT Left 05/29/2018   Procedure: CATARACT EXTRACTION W/ INTRAOCULAR LENS IMPLANT; [id number] 15.0 for DISTANCE   CATARACT EXTRACTION W/  INTRAOCULAR LENS IMPLANT Right 06/05/2018   Procedure: CATARACT EXTRACTION W/ INTRAOCULAR LENS IMPLANT; SN60WF 19.0 for NEAR   HERNIA REPAIR     Procedure: HERNIA REPAIR   REFRACTIVE SURGERY Bilateral 2002   Procedure: REFRACTIVE SURGERY   TRANSPLANTATION RENAL Right 11/26/2023   (E8) TRANSPLANT KIDNEY CADAVERIC UNOS# ALLU030 (RIGHT) KIDNEY COMING ON PUMP BENCH AT 11A PATIENT IN THE ROOM AT 1130A GIVE TISSUE PK TO COURIER performed by Lonni Fairy Roys, MD at [facility] OR  [4] Family History Problem Relation Name Age of Onset   Diabetes Paternal Uncle     Kidney disease Cousin     Amblyopia Neg Hx     Blindness Neg Hx     Cancer Neg Hx     Cataracts Neg Hx     Glaucoma Neg Hx     Hypertension Neg Hx     Macular degeneration Neg Hx     Retinal detachment Neg Hx     Strabismus Neg Hx     Stroke Neg Hx     Thyroid  disease Neg Hx    [5] Allergies Allergen Reactions   Albumin Human Anaphylaxis   Fd And C Blue No.1 [place]     PT UNSURE WHICH DYE   Iodinated Contrast Media Hives    30 YEARS AGO, recent 2021 epidural with contrast ok  [6] Current Outpatient Medications  on File Prior to Visit  Medication Sig Dispense Refill   acetaminophen  (TYLENOL ) 500 mg tablet Take 1,000 mg by mouth every 4 (four) hours as needed for mild pain (1-3).     amLODIPine   (NORVASC ) 2.5 mg tablet Take 2 tablets (5 mg total) by mouth at bedtime. 180 tablet 3   aspirin 81 mg EC tablet Take 1 tablet (81 mg total) by mouth daily. 30 tablet 1   atorvastatin (LIPITOR) 10 mg tablet Take 0.5 tablets (5 mg total) by mouth daily.     famotidine  (PEPCID ) 40 mg tablet Take 1 tablet (40 mg total) by mouth every morning.     fluticasone  propionate (FLONASE ) 50 mcg/spray nasal spray 1 spray.     gabapentin  (NEURONTIN ) 100 mg capsule Take 1 capsule by mouth 3 (three) times a day.     mycophenolate (MYFORTIC) 180 mg TbEC DR tablet Take 2 tablets (360 mg total) by mouth 2 (two) times a day. 120 tablet 5   predniSONE  (DELTASONE ) 5 mg tablet Take 1 tablet (5 mg total) by mouth daily. 90 tablet 2   semaglutide (RYBELSUS) 7 mg tab tablet Take 1 tablet (7 mg total) by mouth daily before breakfast. 30 tablet 1   sulfamethoxazole-trimethoprim (Bactrim) 400-80 mg per tablet Take 1 tablet by mouth 3 (three) times a week. 12 tablet 11   tacrolimus (PROGRAF) 1 mg capsule Take 4 capsules (4 mg total) by mouth 2 (two) times a day. 720 capsule 1   tamsulosin  (FLOMAX ) 0.4 mg cap Take 1 capsule (0.4 mg total) by mouth daily. 90 capsule 2   No current facility-administered medications on file prior to visit.

## 2024-12-13 ENCOUNTER — Other Ambulatory Visit: Payer: Self-pay | Admitting: Orthopedic Surgery

## 2024-12-13 DIAGNOSIS — M545 Low back pain, unspecified: Secondary | ICD-10-CM

## 2024-12-22 ENCOUNTER — Encounter: Payer: Self-pay | Admitting: Internal Medicine

## 2024-12-22 ENCOUNTER — Ambulatory Visit: Admitting: Internal Medicine

## 2024-12-22 VITALS — BP 137/88 | HR 87 | Temp 97.5°F | Ht 69.0 in | Wt 206.0 lb

## 2024-12-22 DIAGNOSIS — K219 Gastro-esophageal reflux disease without esophagitis: Secondary | ICD-10-CM

## 2024-12-22 DIAGNOSIS — D123 Benign neoplasm of transverse colon: Secondary | ICD-10-CM

## 2024-12-22 DIAGNOSIS — Z860101 Personal history of adenomatous and serrated colon polyps: Secondary | ICD-10-CM

## 2024-12-22 DIAGNOSIS — K648 Other hemorrhoids: Secondary | ICD-10-CM

## 2024-12-22 DIAGNOSIS — K5909 Other constipation: Secondary | ICD-10-CM

## 2024-12-22 DIAGNOSIS — K5900 Constipation, unspecified: Secondary | ICD-10-CM

## 2024-12-22 MED ORDER — FAMOTIDINE 40 MG PO TABS: 40.0000 mg | ORAL_TABLET | Freq: Two times a day (BID) | ORAL | 3 refills | Status: AC

## 2024-12-22 NOTE — Patient Instructions (Signed)
 For your neck acid reflux, continue on famotidine .  I will increase this to twice a day.  You can take the second dose as needed for breakthrough symptoms.  Continue on Senokot for your constipation.  You can add MiraLAX  to this if needed.  Try to limit your toilet time to 5 minutes or less.  Continue stay well-hydrated.  Follow-up in 3 months.  It was very nice seeing you both today.  Dr. Cindie

## 2024-12-22 NOTE — Progress Notes (Signed)
 "   Referring Provider: Mercer Clotilda SAUNDERS, MD Primary Care Physician:  Mercer Clotilda SAUNDERS, MD Primary GI:  Dr. Cindie  Chief Complaint  Patient presents with   Follow-up    Patient here today due to having issues with gerd. Patient reports having a lot of acid, belching,and a feeling of something in upper epigastric area. He is taking Famotidine  40 mg once per day at noon.     HPI:   Bruce Weber. is a 74 y.o. male who presents to clinic today for follow-up visit.  Last seen in our office in September 2024.  Chronic GERD: Taking famotidine  40 mg daily.  Symptoms relatively well-controlled, occasional breakthrough symptoms.  Denies any dysphagia odynophagia.  No epigastric or chest pain.  Chronic constipation: Taking Senokot which keeps his symptoms pretty well-controlled.  Does note endorsing sitting on the toilet for 30 to 40 minutes during bowel movements.  Usually brings his tablet in.  No rectal bleeding.  Denies any melena.  Internal hemorrhoids: Status post hemorrhoid banding 2024.  States overall this is improved though does flareup at times due to his constipation.  Uses Preparation H as needed.  History of adenomatous colon polyps:  Last colonoscopy 04/14/22 with multiple tubular adenomas and sessile serrated adenomas removed.  Colonoscopy recall 2028.   Past Medical History:  Diagnosis Date   Adenoma 11/03/2009   1.1 cm from TCS   DM (diabetes mellitus) (HCC)    GERD (gastroesophageal reflux disease)    High cholesterol    History of kidney stones    HTN (hypertension)    Kidney failure    stage 4   Kidney stones    Obesity (BMI 30-39.9) DEC 2011 223 LBS   Rectal bleeding 12/04/2009   secondary to hemorrhoids    Past Surgical History:  Procedure Laterality Date   CIRCUMCISION     COLONOSCOPY  Dec 2010 [facility]   MOD IH, SIMPLE ADENOMA 1.1 CM   COLONOSCOPY  11/25/2012   SLF: 1. Sessile polyp measuring 6mm in size was found at the hepatic flesure; polypectomy was  performed using snare cautery 2. Moderate diverticulosis was noted in the ascending colon and sigmoid colon. 3. The colon mucosa was otherwise normal 4. Large internal hemorrhoids.    COLONOSCOPY N/A 03/01/2017   Procedure: COLONOSCOPY;  Surgeon: Margo LITTIE Haddock, MD;  Location: [facility];  Service: Endoscopy;  Laterality: N/A;  10:30 AM   COLONOSCOPY WITH PROPOFOL  N/A 04/14/2022   Procedure: COLONOSCOPY WITH PROPOFOL ;  Surgeon: Cindie Carlin POUR, DO;  Location: [facility];  Service: Endoscopy;  Laterality: N/A;  11:00am   EYE SURGERY  05/2018   bilateral cataract surgery   HERNIA REPAIR     history of ESRD     KIDNEY TRANSPLANT  11/26/2023   POLYPECTOMY  04/14/2022   Procedure: POLYPECTOMY;  Surgeon: Cindie Carlin POUR, DO;  Location: [facility];  Service: Endoscopy;;    Current Outpatient Medications  Medication Sig Dispense Refill   acetaminophen  (TYLENOL ) 325 MG tablet Take 2 tablets (650 mg total) by mouth every 6 (six) hours as needed for moderate pain. 30 tablet 0   amLODipine  (NORVASC ) 10 MG tablet TAKE 1 TABLET BY MOUTH EVERYDAY AT BEDTIME (Patient taking differently: Take 5 mg by mouth daily.) 90 tablet 0   aspirin EC 81 MG tablet Take 1 tablet by mouth daily.     atorvastatin (LIPITOR) 10 MG tablet TAKE 1 TABLET BY MOUTH EVERY DAY (NEED PHYSICAL) (Patient taking differently:  Take 2.5 mg by mouth daily.) 90 tablet 1   cetirizine  (ZYRTEC ) 10 MG chewable tablet Chew 10 mg by mouth daily.     famotidine  (PEPCID ) 40 MG tablet TAKE 1 TABLET BY MOUTH IN THE MORNING WITH BREAKFAST 90 tablet 4   fluticasone  (FLONASE ) 50 MCG/ACT nasal spray Place 1 spray into both nostrils daily.     gabapentin  (NEURONTIN ) 100 MG capsule TAKE 1 CAPSULE BY MOUTH THREE TIMES A DAY 90 capsule 2   mycophenolate (MYFORTIC) 180 MG EC tablet Take 360 mg by mouth 2 (two) times daily.     polyethylene glycol powder (GLYCOLAX /MIRALAX ) 17 GM/SCOOP powder Take by mouth once. (Patient taking differently: Take by  mouth as needed.)     predniSONE  (DELTASONE ) 5 MG tablet Take 20 mg by mouth daily. (Patient taking differently: Take 5 mg by mouth daily with breakfast.)     Semaglutide (RYBELSUS) 7 MG TABS Take by mouth daily at 6 (six) AM.     sennosides-docusate sodium (SENOKOT-S) 8.6-50 MG tablet Take 1 tablet by mouth daily.     tacrolimus (PROGRAF) 1 MG capsule Take 5 mg by mouth 2 (two) times daily. (Patient taking differently: Take 5 mg by mouth. 1 mg in am and 3 mg in pm (total of 4mg ) daily.)     tamsulosin  (FLOMAX ) 0.4 MG CAPS capsule Take 2 capsules (0.8 mg total) by mouth daily. (Patient taking differently: Take 0.4 mg by mouth daily.) 60 capsule 1   No current facility-administered medications for this visit.    Allergies as of 12/22/2024 - Review Complete 12/22/2024  Allergen Reaction Noted   Albumin human Anaphylaxis 11/27/2023   Dye fdc blue [brilliant blue fcf (fd&c blue #1)]  06/12/2011   Iodinated contrast media Hives 06/12/2011    Family History  Problem Relation Age of Onset   Lung cancer Father    High blood pressure Father    Colon cancer Neg Hx    Colon polyps Neg Hx     Social History   Socioeconomic History   Marital status: Married    Spouse name: Not on file   Number of children: 0   Years of education: Not on file   Highest education level: Some college, no degree  Occupational History   Occupation: Surveyor, Minerals for [employer]    Comment: retired   Occupation: surveyor, minerals    Comment: full time from home   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 MARRIED TO 2ND WIFE.       02/06/2019: Lives with mother at this time assisting in her care, while wife lives nearby, taking care of her own parents.   Works from home full time, but planning on retiring in next month to focus on health/well-being   Used to work out on treadmill, but has not in recent months   Motivated to return to exercise regimine and better diet control   Enjoys watching basketball, football      Social Drivers of Health   Tobacco Use: Low Risk (12/22/2024)  Patient History    Smoking Tobacco Use: Never    Smokeless Tobacco Use: Never    Passive Exposure: Never  Financial Resource Strain: Low Risk (10/03/2023)   Overall Financial Resource Strain (CARDIA)    Difficulty of Paying Living Expenses: Not very hard  Food Insecurity: Low Risk (09/18/2024)   Received from [facility]   Epic    Within the past 12 months, you worried that your food would run out before you got money to buy more: Never true    Within the past 12 months, the food you bought just didn't last and you didn't have money to get more. : Never true  Transportation Needs: No Transportation Needs (09/18/2024)   Received from [facility]    In the past 12 months, has lack of reliable transportation kept you from medical appointments, meetings, work or from getting things needed for daily living? : No  Physical Activity: Insufficiently Active (10/03/2023)   Exercise Vital Sign    Days of Exercise per Week: 1 day    Minutes of Exercise per Session: 10 min  Stress: Stress Concern Present (10/03/2023)   [employer] of Occupational Health - Occupational Stress Questionnaire    Feeling of Stress : To some extent  Social Connections: Socially Integrated (10/03/2023)   Social Connection and Isolation Panel    Frequency of Communication with Friends and Family: Once a week    Frequency of Social Gatherings with Friends and Family: More than three times a week    Attends Religious Services: More than 4 times  per year    Active Member of Clubs or Organizations: Yes    Attends Banker Meetings: More than 4 times per year    Marital Status: Married  Depression (PHQ2-9): Low Risk (09/09/2024)   Depression (PHQ2-9)    PHQ-2 Score: 0  Alcohol Screen: Low Risk (08/09/2022)   Alcohol Screen    Last Alcohol Screening Score (AUDIT): 0  Housing: Low Risk (09/18/2024)   Received from [facility]   Epic    What is your living situation today?: I have a steady place to live    Think about the place you live. Do you have problems with any of the following? Choose all that apply:: None/None on this list  Utilities: Low Risk (09/18/2024)   Received from [facility]   Utilities    In the past 12 months has the electric, gas, oil, or water  company threatened to shut off services in your home? : No  Health Literacy: Not on file    Subjective: Review of Systems  Constitutional:  Negative for chills and fever.  HENT:  Negative for congestion and hearing loss.   Eyes:  Negative for blurred vision and double vision.  Respiratory:  Negative for cough and shortness of breath.   Cardiovascular:  Negative for chest pain and palpitations.  Gastrointestinal:  Positive for constipation and heartburn. Negative for abdominal pain, blood in stool, diarrhea, melena and vomiting.  Genitourinary:  Negative for dysuria and urgency.  Musculoskeletal:  Negative for joint pain and myalgias.  Skin:  Negative for itching and rash.  Neurological:  Negative for dizziness and headaches.  Psychiatric/Behavioral:  Negative for depression. The patient is not nervous/anxious.      Objective: BP 137/88 (BP Location: Left Arm, Patient Position: Sitting, Cuff Size: Large)   Pulse 87   Temp (!) 97.5 F (36.4 C) (Temporal)   Ht 5' 9 (1.753 m)   Wt 206  lb (93.4 kg)   BMI 30.42 kg/m  Physical Exam Constitutional:      Appearance: Normal appearance.  HENT:     Head: Normocephalic and atraumatic.  Eyes:      Extraocular Movements: Extraocular movements intact.     Conjunctiva/sclera: Conjunctivae normal.  Cardiovascular:     Rate and Rhythm: Normal rate and regular rhythm.  Pulmonary:     Effort: Pulmonary effort is normal.     Breath sounds: Normal breath sounds.  Abdominal:     General: Bowel sounds are normal.     Palpations: Abdomen is soft.  Musculoskeletal:        General: Normal range of motion.     Cervical back: Normal range of motion and neck supple.  Skin:    General: Skin is warm.  Neurological:     General: No focal deficit present.     Mental Status: He is alert and oriented to person, place, and time.  Psychiatric:        Mood and Affect: Mood normal.        Behavior: Behavior normal.      Assessment/Plan:  1.  Chronic GERD-symptoms relatively well-controlled on famotidine  daily.  Will increase to twice daily.  He can take second dose as needed for breakthrough symptoms.  Can consider PPI therapy if not improved.  Transplant nephrologist note reviewed who stated PPI would be okay.  2.  Chronic constipation-continue Senokot.  Add MiraLAX  as needed.  Counseled on staying well-hydrated.  3.  Internal hemorrhoids-continue Preparation H as needed.  Try to limit toilet time to less than 5 minutes.  Avoid straining.  4.  History of adenomatous colon polyps-colonoscopy recall 2028.  Follow-up in 3 months or sooner if needed.   12/22/2024 9:54 AM  "

## 2025-03-04 ENCOUNTER — Ambulatory Visit: Admitting: Gastroenterology
